# Patient Record
Sex: Female | Born: 1935 | Race: White | Hispanic: No | Smoking: Never smoker
Health system: Southern US, Community
[De-identification: ages and names within clinical notes are randomized; demographics above are authoritative.]

## PROBLEM LIST (undated history)

## (undated) DIAGNOSIS — E785 Hyperlipidemia, unspecified: Secondary | ICD-10-CM

## (undated) DIAGNOSIS — I10 Essential (primary) hypertension: Secondary | ICD-10-CM

## (undated) DIAGNOSIS — M8580 Other specified disorders of bone density and structure, unspecified site: Secondary | ICD-10-CM

## (undated) DIAGNOSIS — I4891 Unspecified atrial fibrillation: Secondary | ICD-10-CM

## (undated) DIAGNOSIS — L900 Lichen sclerosus et atrophicus: Secondary | ICD-10-CM

## (undated) DIAGNOSIS — K219 Gastro-esophageal reflux disease without esophagitis: Secondary | ICD-10-CM

## (undated) DIAGNOSIS — I639 Cerebral infarction, unspecified: Secondary | ICD-10-CM

## (undated) DIAGNOSIS — R002 Palpitations: Secondary | ICD-10-CM

## (undated) HISTORY — DX: Hyperlipidemia, unspecified: E78.5

## (undated) HISTORY — DX: Lichen sclerosus et atrophicus: L90.0

## (undated) HISTORY — PX: CARPAL TUNNEL RELEASE: SHX101

## (undated) HISTORY — DX: Gastro-esophageal reflux disease without esophagitis: K21.9

## (undated) HISTORY — DX: Palpitations: R00.2

## (undated) HISTORY — PX: FOOT SURGERY: SHX648

## (undated) HISTORY — DX: Other specified disorders of bone density and structure, unspecified site: M85.80

## (undated) HISTORY — DX: Essential (primary) hypertension: I10

---

## 2004-03-16 ENCOUNTER — Ambulatory Visit: Payer: Self-pay | Admitting: Family Medicine

## 2005-04-27 ENCOUNTER — Ambulatory Visit: Payer: Self-pay | Admitting: Family Medicine

## 2005-07-01 ENCOUNTER — Ambulatory Visit: Payer: Self-pay | Admitting: Family Medicine

## 2006-11-30 ENCOUNTER — Ambulatory Visit: Payer: Self-pay | Admitting: Family Medicine

## 2007-12-03 ENCOUNTER — Ambulatory Visit: Payer: Self-pay | Admitting: Family Medicine

## 2008-03-21 ENCOUNTER — Ambulatory Visit: Payer: Self-pay | Admitting: Specialist

## 2008-03-21 ENCOUNTER — Ambulatory Visit: Payer: Self-pay | Admitting: Cardiology

## 2008-03-27 ENCOUNTER — Ambulatory Visit: Payer: Self-pay | Admitting: Specialist

## 2008-12-03 ENCOUNTER — Ambulatory Visit: Payer: Self-pay | Admitting: Family Medicine

## 2010-01-27 LAB — HM DEXA SCAN: HM Dexa Scan: NORMAL

## 2010-03-02 ENCOUNTER — Ambulatory Visit: Payer: Self-pay | Admitting: Family Medicine

## 2011-03-08 ENCOUNTER — Ambulatory Visit: Payer: Self-pay | Admitting: Family Medicine

## 2013-04-02 ENCOUNTER — Ambulatory Visit: Payer: Self-pay | Admitting: Family Medicine

## 2013-08-23 ENCOUNTER — Ambulatory Visit: Payer: Self-pay | Admitting: Unknown Physician Specialty

## 2013-11-04 ENCOUNTER — Encounter: Payer: Self-pay | Admitting: Neurology

## 2013-11-13 ENCOUNTER — Encounter: Payer: Self-pay | Admitting: Neurology

## 2013-12-14 ENCOUNTER — Encounter: Payer: Self-pay | Admitting: Neurology

## 2014-04-29 ENCOUNTER — Ambulatory Visit: Payer: Self-pay | Admitting: Family Medicine

## 2014-07-22 LAB — BASIC METABOLIC PANEL
BUN: 12 mg/dL (ref 4–21)
Creatinine: 0.9 mg/dL (ref 0.5–1.1)
Glucose: 91 mg/dL
Potassium: 4.5 mmol/L (ref 3.4–5.3)
Sodium: 140 mmol/L (ref 137–147)

## 2014-07-22 LAB — LIPID PANEL
Cholesterol: 201 mg/dL — AB (ref 0–200)
HDL: 77 mg/dL — AB (ref 35–70)
LDL Cholesterol: 99 mg/dL
LDl/HDL Ratio: 1.3
Triglycerides: 124 mg/dL (ref 40–160)

## 2014-07-22 LAB — HEPATIC FUNCTION PANEL
ALT: 13 U/L (ref 7–35)
AST: 17 U/L (ref 13–35)
Alkaline Phosphatase: 80 U/L (ref 25–125)
Bilirubin, Total: 0.3 mg/dL

## 2014-07-22 LAB — CBC AND DIFFERENTIAL
HCT: 44 % (ref 36–46)
Hemoglobin: 14.8 g/dL (ref 12.0–16.0)
Neutrophils Absolute: 3 /uL
Platelets: 232 10*3/uL (ref 150–399)
WBC: 5.6 10^3/mL

## 2014-07-22 LAB — HEMOGLOBIN A1C: Hgb A1c MFr Bld: 6.1 % — AB (ref 4.0–6.0)

## 2014-07-22 LAB — TSH: TSH: 3.73 u[IU]/mL (ref 0.41–5.90)

## 2014-11-03 ENCOUNTER — Encounter: Payer: Self-pay | Admitting: Family Medicine

## 2014-11-03 ENCOUNTER — Ambulatory Visit (INDEPENDENT_AMBULATORY_CARE_PROVIDER_SITE_OTHER): Payer: PPO | Admitting: Family Medicine

## 2014-11-03 VITALS — BP 144/80 | HR 73 | Temp 98.4°F | Resp 16 | Ht 63.0 in | Wt 160.0 lb

## 2014-11-03 DIAGNOSIS — J069 Acute upper respiratory infection, unspecified: Secondary | ICD-10-CM

## 2014-11-03 MED ORDER — HYDROCODONE-HOMATROPINE 5-1.5 MG/5ML PO SYRP: ORAL_SOLUTION | ORAL | Status: DC

## 2014-11-03 NOTE — Patient Instructions (Signed)
Continue Robitussin or Mucinex  expectorant

## 2014-11-03 NOTE — Progress Notes (Signed)
Subjective:     Patient ID: Deanna Williams, female   DOB: 14-Jun-1928, 81 y.o.   MRN: [id number]  HPI  Chief Complaint  Patient presents with  . Cough    patient comes in office today with complaints of productive cough/yellow green phlegm and congestion for one week  Has been taking Robitussin for her sx. States cough keeps her up at night.   Review of Systems  Constitutional: Negative for fever and chills.  Respiratory: Negative for shortness of breath.        Objective:   Physical Exam  Constitutional: She appears well-developed and well-nourished.  Ears: T.M's intact without inflammation Throat: no tonsillar enlargement or exudate Neck: no cervical adenopathy Lungs: coarse basilar crackles    Assessment:     1. Upper respiratory infection  - HYDROcodone-homatropine (HYCODAN) 5-1.5 MG/5ML syrup; 2.5 to 5 ml.  Dispense: 120 mL; Refill: 0    Plan:    continue expectorant Call if cough not improving over the course of the week.

## 2014-11-04 ENCOUNTER — Telehealth: Payer: Self-pay | Admitting: Family Medicine

## 2014-11-04 NOTE — Telephone Encounter (Signed)
2.5 to 5 ml. Every 8 hours as needed for cough

## 2014-11-04 NOTE — Telephone Encounter (Signed)
Spoke with pharmacist on phone and advised her of sig as stated below.

## 2014-11-04 NOTE — Telephone Encounter (Signed)
Please advise sig.

## 2014-11-04 NOTE — Telephone Encounter (Signed)
Amy with [facility] states the Rx for HYDROcodone-homatropine (HYCODAN) 5-1.5 MG/5ML syrup did not have directions listed.  JA#[phone]/AL

## 2015-04-14 DIAGNOSIS — M8580 Other specified disorders of bone density and structure, unspecified site: Secondary | ICD-10-CM | POA: Insufficient documentation

## 2015-04-14 DIAGNOSIS — R030 Elevated blood-pressure reading, without diagnosis of hypertension: Secondary | ICD-10-CM

## 2015-04-14 DIAGNOSIS — E785 Hyperlipidemia, unspecified: Secondary | ICD-10-CM | POA: Insufficient documentation

## 2015-04-14 DIAGNOSIS — K219 Gastro-esophageal reflux disease without esophagitis: Secondary | ICD-10-CM | POA: Insufficient documentation

## 2015-04-14 DIAGNOSIS — N6019 Diffuse cystic mastopathy of unspecified breast: Secondary | ICD-10-CM | POA: Insufficient documentation

## 2015-04-14 DIAGNOSIS — M1990 Unspecified osteoarthritis, unspecified site: Secondary | ICD-10-CM | POA: Insufficient documentation

## 2015-04-14 DIAGNOSIS — I10 Essential (primary) hypertension: Secondary | ICD-10-CM | POA: Insufficient documentation

## 2015-04-14 DIAGNOSIS — R7303 Prediabetes: Secondary | ICD-10-CM | POA: Insufficient documentation

## 2015-04-14 DIAGNOSIS — J309 Allergic rhinitis, unspecified: Secondary | ICD-10-CM | POA: Insufficient documentation

## 2015-04-14 DIAGNOSIS — E7800 Pure hypercholesterolemia, unspecified: Secondary | ICD-10-CM | POA: Insufficient documentation

## 2015-04-14 DIAGNOSIS — E669 Obesity, unspecified: Secondary | ICD-10-CM | POA: Insufficient documentation

## 2015-04-14 DIAGNOSIS — IMO0001 Reserved for inherently not codable concepts without codable children: Secondary | ICD-10-CM | POA: Insufficient documentation

## 2015-04-14 DIAGNOSIS — R002 Palpitations: Secondary | ICD-10-CM | POA: Insufficient documentation

## 2015-04-21 ENCOUNTER — Ambulatory Visit (INDEPENDENT_AMBULATORY_CARE_PROVIDER_SITE_OTHER): Payer: PPO | Admitting: Family Medicine

## 2015-04-21 ENCOUNTER — Encounter: Payer: Self-pay | Admitting: Family Medicine

## 2015-04-21 VITALS — BP 118/72 | HR 62 | Temp 98.0°F | Resp 12 | Ht 62.5 in | Wt 159.0 lb

## 2015-04-21 DIAGNOSIS — J3489 Other specified disorders of nose and nasal sinuses: Secondary | ICD-10-CM | POA: Diagnosis not present

## 2015-04-21 DIAGNOSIS — Z0000 Encounter for general adult medical examination without abnormal findings: Secondary | ICD-10-CM | POA: Diagnosis not present

## 2015-04-21 DIAGNOSIS — Z1239 Encounter for other screening for malignant neoplasm of breast: Secondary | ICD-10-CM

## 2015-04-21 NOTE — Progress Notes (Signed)
Patient ID: Deanna Williams, female   DOB: 1928/07/24, 81 y.o.   MRN: [id number]  Visit Date: 04/21/2015  Today's Provider: [name], MD   Chief Complaint  Patient presents with  . Medicare Wellness   Subjective:   Deanna Williams is a 81 y.o. female who presents today for her Subsequent Annual Wellness Visit. She feels well. She reports exercising walking. She reports she is sleeping well. Immunization History  Administered Date(s) Administered  . Influenza-Unspecified 02/13/2014  . Pneumococcal Conjugate-13 03/24/2014  . Pneumococcal Polysaccharide-23 03/04/1998  . Td 03/06/2008  . Zoster 07/30/2007   LAST Pap smear 10/16/07  Mammogram 04/29/14  Colonoscopy 06/18/02 normal   BMD 01/27/10 osteopenia.    Review of Systems  Constitutional: Negative.   HENT: Negative.   Eyes: Negative.   Respiratory: Negative.   Cardiovascular: Negative.   Gastrointestinal: Negative.   Endocrine: Negative.   Genitourinary: Negative.   Musculoskeletal: Positive for back pain and arthralgias.  Skin: Negative.   Allergic/Immunologic: Negative.   Neurological: Negative.   Hematological: Negative.   Psychiatric/Behavioral: Negative.     Patient Active Problem List   Diagnosis Date Noted  . Allergic rhinitis 04/14/2015  . Blood pressure elevated 04/14/2015  . 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 Acid reflux 04/14/2015  . 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 Hypercholesterolemia 04/14/2015  . BP (high blood pressure) 04/14/2015  . Adiposity 04/14/2015  . Arthritis, degenerative 04/14/2015  . Osteopenia 04/14/2015  . Awareness of heartbeats 04/14/2015    Social History   Social History  . Marital Status: Married    Spouse Name: N/A  . Number of Children: N/A  . Years of Education: N/A   Occupational History  . Not on file.   Social History Main Topics  . Smoking status: Never Smoker   . Smokeless tobacco: Never Used  . Alcohol Use: 0.0 oz/week    0 Standard drinks or  equivalent per week  . Drug Use: No  . Sexual Activity: No   Other Topics Concern  . Not on file   Social History Narrative    Past Surgical History  Procedure Laterality Date  . Foot surgery    . Carpal tunnel release      Her family history includes Arthritis in her sister; Cancer in her father and mother; Hypertension in her father; Leukemia in her mother.    Outpatient Prescriptions Prior to Visit  Medication Sig Dispense Refill  . HYDROcodone-homatropine (HYCODAN) 5-1.5 MG/5ML syrup 2.5 to 5 ml. 120 mL 0   No facility-administered medications prior to visit.    No Known Allergies  Patient Care Team: Jerrol Banana., MD as PCP - General (Family Medicine)  Objective:   Vitals:  Filed Vitals:   04/21/15 0913  BP: 118/72  Pulse: 62  Temp: 98 F (36.7 C)  Resp: 12  Height: 5' 2.5" (1.588 m)  Weight: 159 lb (72.122 kg)    Physical Exam  Constitutional: She is oriented to person, place, and time. She appears well-developed and well-nourished.  HENT:  Head: Normocephalic and atraumatic.  Right Ear: External ear normal.  Left Ear: External ear normal.  Nose: Nose normal.  Eyes: Conjunctivae are normal.  Neck: Neck supple.  Cardiovascular: Normal rate, regular rhythm and normal heart sounds.   Pulmonary/Chest: Effort normal and breath sounds normal.  Abdominal: Soft.  Neurological: She is alert and oriented to person, place, and time.  Skin: Skin is dry.  Psychiatric: She has a normal mood and affect.  Her behavior is normal. Judgment and thought content normal.    Activities of Daily Living In your present state of health, do you have any difficulty performing the following activities: 04/21/2015  Hearing? N  Vision? N  Difficulty concentrating or making decisions? N  Walking or climbing stairs? N  Dressing or bathing? N  Doing errands, shopping? N    Fall Risk Assessment Fall Risk  04/21/2015  Falls in the past year? No     Depression  Screen PHQ 2/9 Scores 04/21/2015  PHQ - 2 Score 0    Cognitive Testing - 6-CIT    Year: 0 4 points  Month: 0 3 points  Memorize "Pia Mau, [address], [employer]"  Time (within 1 hour:) 0 3 points  Count backwards from 20: 0 2 4 points  Name months of year: 0 2 4 points  Repeat Address: 0 2 4 6 8 10  points   Total Score: 0/28  Interpretation : Normal (0-7) Abnormal (8-28)    Assessment & Plan:     Annual Wellness Visit  Reviewed patient's Family Medical History Reviewed and updated list of patient's medical providers Assessment of cognitive impairment was done Assessed patient's functional ability Established a written schedule for health screening [place] Completed and Reviewed      Miguel Aschoff MD [facility] 04/21/2015 9:15 AM  ------------------------------------------------------------------------------------------------------------

## 2015-05-07 ENCOUNTER — Ambulatory Visit
Admission: RE | Admit: 2015-05-07 | Discharge: 2015-05-07 | Disposition: A | Discharge status: Home / Self Care | Payer: PPO | Source: Ambulatory Visit | Attending: Family Medicine | Admitting: Family Medicine

## 2015-05-07 DIAGNOSIS — Z1239 Encounter for other screening for malignant neoplasm of breast: Secondary | ICD-10-CM

## 2015-05-07 DIAGNOSIS — Z1231 Encounter for screening mammogram for malignant neoplasm of breast: Secondary | ICD-10-CM | POA: Insufficient documentation

## 2015-06-25 DIAGNOSIS — L570 Actinic keratosis: Secondary | ICD-10-CM | POA: Diagnosis not present

## 2015-06-25 DIAGNOSIS — L821 Other seborrheic keratosis: Secondary | ICD-10-CM | POA: Diagnosis not present

## 2015-06-25 DIAGNOSIS — L578 Other skin changes due to chronic exposure to nonionizing radiation: Secondary | ICD-10-CM | POA: Diagnosis not present

## 2015-06-25 DIAGNOSIS — L719 Rosacea, unspecified: Secondary | ICD-10-CM | POA: Diagnosis not present

## 2015-06-25 DIAGNOSIS — L820 Inflamed seborrheic keratosis: Secondary | ICD-10-CM | POA: Diagnosis not present

## 2015-06-25 DIAGNOSIS — L812 Freckles: Secondary | ICD-10-CM | POA: Diagnosis not present

## 2015-07-20 ENCOUNTER — Ambulatory Visit: Payer: PPO | Admitting: Family Medicine

## 2015-07-27 ENCOUNTER — Ambulatory Visit
Admission: RE | Admit: 2015-07-27 | Discharge: 2015-07-27 | Disposition: A | Discharge status: Home / Self Care | Payer: PPO | Source: Ambulatory Visit | Attending: Family Medicine | Admitting: Family Medicine

## 2015-07-27 ENCOUNTER — Ambulatory Visit (INDEPENDENT_AMBULATORY_CARE_PROVIDER_SITE_OTHER): Payer: PPO | Admitting: Family Medicine

## 2015-07-27 VITALS — BP 128/62 | HR 64 | Temp 97.7°F | Resp 14 | Wt 158.0 lb

## 2015-07-27 DIAGNOSIS — M419 Scoliosis, unspecified: Secondary | ICD-10-CM | POA: Insufficient documentation

## 2015-07-27 DIAGNOSIS — M5136 Other intervertebral disc degeneration, lumbar region: Secondary | ICD-10-CM | POA: Diagnosis not present

## 2015-07-27 DIAGNOSIS — N949 Unspecified condition associated with female genital organs and menstrual cycle: Secondary | ICD-10-CM

## 2015-07-27 DIAGNOSIS — R319 Hematuria, unspecified: Secondary | ICD-10-CM | POA: Diagnosis not present

## 2015-07-27 DIAGNOSIS — I739 Peripheral vascular disease, unspecified: Secondary | ICD-10-CM | POA: Insufficient documentation

## 2015-07-27 DIAGNOSIS — M160 Bilateral primary osteoarthritis of hip: Secondary | ICD-10-CM | POA: Diagnosis not present

## 2015-07-27 DIAGNOSIS — M545 Low back pain: Secondary | ICD-10-CM | POA: Diagnosis not present

## 2015-07-27 DIAGNOSIS — M79604 Pain in right leg: Secondary | ICD-10-CM | POA: Diagnosis not present

## 2015-07-27 DIAGNOSIS — R739 Hyperglycemia, unspecified: Secondary | ICD-10-CM

## 2015-07-27 DIAGNOSIS — E7800 Pure hypercholesterolemia, unspecified: Secondary | ICD-10-CM

## 2015-07-27 LAB — POCT URINALYSIS DIPSTICK
Bilirubin, UA: NEGATIVE
Glucose, UA: NEGATIVE
Ketones, UA: NEGATIVE
Leukocytes, UA: NEGATIVE
Nitrite, UA: NEGATIVE
Protein, UA: NEGATIVE
Spec Grav, UA: 1.01
Urobilinogen, UA: 0.2
pH, UA: 6.5

## 2015-07-27 MED ORDER — MELOXICAM 7.5 MG PO TABS: 7.5000 mg | ORAL_TABLET | Freq: Every day | ORAL | Status: DC

## 2015-07-27 NOTE — Progress Notes (Signed)
Patient ID: IDALY LYSON, female   DOB: July 27, 1928, 82 y.o.   MRN: [id number]    Subjective:  HPI  Patient is here for follow up. Patient needs labs done. Last labs were March 2016. She is still not on any medications.  She has had issues with lower back pain and pain radiates to the right leg with walking/anything physical. No leg weakness. She tries to sit down when the pain starts and after a little while the pain goes away and she can move again.  She also states that she started to have burning and itching vaginal again. She states this happened before and looking at the old records it was in 2014, we did wet prep that was ok and started her pn premarin cream, patient a week later developed hematuria and after seen her again we referred patient to Dr .Melody Haver. Symptoms are the same as before. Symptoms occurs daily all day long.  Prior to Admission medications   Not on File    Patient Active Problem List   Diagnosis Date Noted  . Allergic rhinitis 04/14/2015  . Blood pressure elevated 04/14/2015  . 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 Acid reflux 04/14/2015  . 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 Hypercholesterolemia 04/14/2015  . BP (high blood pressure) 04/14/2015  . Adiposity 04/14/2015  . Arthritis, degenerative 04/14/2015  . Osteopenia 04/14/2015  . Awareness of heartbeats 04/14/2015    No past medical history on file.  Social History   Social History  . Marital Status: Married    Spouse Name: N/A  . Number of Children: N/A  . Years of Education: N/A   Occupational History  . Not on file.   Social History Main Topics  . Smoking status: Never Smoker   . Smokeless tobacco: Never Used  . Alcohol Use: 0.0 oz/week    0 Standard drinks or equivalent per week  . Drug Use: No  . Sexual Activity: No   Other Topics Concern  . Not on file   Social History Narrative    No Known Allergies  Review of Systems  Constitutional: Negative.   HENT: Negative.     Eyes: Negative.   Respiratory: Negative.   Cardiovascular: Negative.   Gastrointestinal: Negative.   Genitourinary: Negative.        Vaginal burning and itching  Musculoskeletal: Positive for back pain and joint pain.  Skin: Negative.   Neurological: Negative.   Endo/Heme/Allergies: Negative.   Psychiatric/Behavioral: Negative.     Immunization History  Administered Date(s) Administered  . Influenza-Unspecified 02/13/2014  . Pneumococcal Conjugate-13 03/24/2014  . Pneumococcal Polysaccharide-23 03/04/1998  . Td 03/06/2008  . Zoster 07/30/2007   Objective:  BP 128/62 mmHg  Pulse 64  Temp(Src) 97.7 F (36.5 C)  Resp 14  Wt 158 lb (71.668 kg)  Physical Exam  Constitutional: She is oriented to person, place, and time and well-developed, well-nourished, and in no distress.  HENT:  Head: Normocephalic and atraumatic.  Right Ear: External ear normal.  Left Ear: External ear normal.  Nose: Nose normal.  Eyes: Conjunctivae are normal. Pupils are equal, round, and reactive to light.  Neck: Normal range of motion. Neck supple. No thyromegaly present.  Cardiovascular: Normal rate and regular rhythm.   Murmur heard.  Systolic murmur is present with a grade of 2/6  Pulmonary/Chest: Effort normal and breath sounds normal. No respiratory distress. She has no wheezes.  Abdominal: Soft.  Musculoskeletal: She exhibits no edema or tenderness.  Neurological: She is alert and oriented  to person, place, and time. She displays normal reflexes. Gait normal.  Skin: Skin is warm and dry.  Psychiatric: Mood, memory, affect and judgment normal.    Lab Results  Component Value Date   WBC 5.6 07/22/2014   HGB 14.8 07/22/2014   HCT 44 07/22/2014   PLT 232 07/22/2014   CHOL 201* 07/22/2014   TRIG 124 07/22/2014   HDL 77* 07/22/2014   LDLCALC 99 07/22/2014   TSH 3.73 07/22/2014   HGBA1C 6.1* 07/22/2014    CMP     Component Value Date/Time   NA 140 07/22/2014   K 4.5 07/22/2014    BUN 12 07/22/2014   CREATININE 0.9 07/22/2014   AST 17 07/22/2014   ALT 13 07/22/2014   ALKPHOS 80 07/22/2014    Assessment and Plan :  1. Hypercholesterolemia - CBC with Differential/Platelet - Comprehensive metabolic panel - Lipid Panel With LDL/HDL Ratio  2. Hyperglycemia - HgB A1c  3. Vaginal burning/atrophic vaginitis Refer back to Dr. Orlene Plum. - POCT urinalysis dipstick - Ambulatory referral to Gynecology  4. Hematuria - Ambulatory referral to Gynecology  5. Right leg pain New. Will xray and start Meloxicam for 1 month. Patient to let us know if symptoms continue and may need further work up. - DG Lumbar Spine Complete; Future - DG HIP UNILAT WITH PELVIS 2-3 VIEWS RIGHT; Future  6. Claudication of right lower extremity (HCC) - DG Lumbar Spine Complete; Future - DG HIP UNILAT WITH PELVIS 2-3 VIEWS RIGHT; Future Likely neurogenic claudication. I have done the exam and reviewed the above chart and it is accurate to the best of my knowledge.   Miguel Aschoff MD [facility] 07/27/2015 11:05 AM

## 2015-07-30 DIAGNOSIS — R739 Hyperglycemia, unspecified: Secondary | ICD-10-CM | POA: Diagnosis not present

## 2015-07-30 DIAGNOSIS — E7800 Pure hypercholesterolemia, unspecified: Secondary | ICD-10-CM | POA: Diagnosis not present

## 2015-07-31 LAB — COMPREHENSIVE METABOLIC PANEL
ALT: 7 IU/L (ref 0–32)
AST: 13 IU/L (ref 0–40)
Albumin/Globulin Ratio: 1.9 (ref 1.2–2.2)
Albumin: 4.1 g/dL (ref 3.5–4.7)
Alkaline Phosphatase: 81 IU/L (ref 39–117)
BUN/Creatinine Ratio: 23 (ref 11–26)
BUN: 20 mg/dL (ref 8–27)
Bilirubin Total: 0.5 mg/dL (ref 0.0–1.2)
CO2: 22 mmol/L (ref 18–29)
Calcium: 9.3 mg/dL (ref 8.7–10.3)
Chloride: 102 mmol/L (ref 96–106)
Creatinine, Ser: 0.88 mg/dL (ref 0.57–1.00)
GFR calc Af Amer: 70 mL/min/{1.73_m2} (ref >59)
GFR calc non Af Amer: 61 mL/min/{1.73_m2} (ref >59)
Globulin, Total: 2.2 g/dL (ref 1.5–4.5)
Glucose: 100 mg/dL — ABNORMAL HIGH (ref 65–99)
Potassium: 4.7 mmol/L (ref 3.5–5.2)
Sodium: 140 mmol/L (ref 134–144)
Total Protein: 6.3 g/dL (ref 6.0–8.5)

## 2015-07-31 LAB — LIPID PANEL WITH LDL/HDL RATIO
Cholesterol, Total: 190 mg/dL (ref 100–199)
HDL: 79 mg/dL (ref >39)
LDL Calculated: 98 mg/dL (ref 0–99)
LDl/HDL Ratio: 1.2 ratio units (ref 0.0–3.2)
Triglycerides: 66 mg/dL (ref 0–149)
VLDL Cholesterol Cal: 13 mg/dL (ref 5–40)

## 2015-07-31 LAB — CBC WITH DIFFERENTIAL/PLATELET
Basophils Absolute: 0 10*3/uL (ref 0.0–0.2)
Basos: 0 %
EOS (ABSOLUTE): 0.1 10*3/uL (ref 0.0–0.4)
Eos: 2 %
Hematocrit: 43.8 % (ref 34.0–46.6)
Hemoglobin: 14.3 g/dL (ref 11.1–15.9)
Immature Grans (Abs): 0 10*3/uL (ref 0.0–0.1)
Immature Granulocytes: 0 %
Lymphocytes Absolute: 1.5 10*3/uL (ref 0.7–3.1)
Lymphs: 32 %
MCH: 31 pg (ref 26.6–33.0)
MCHC: 32.6 g/dL (ref 31.5–35.7)
MCV: 95 fL (ref 79–97)
Monocytes Absolute: 0.3 10*3/uL (ref 0.1–0.9)
Monocytes: 7 %
Neutrophils Absolute: 2.7 10*3/uL (ref 1.4–7.0)
Neutrophils: 59 %
Platelets: 233 10*3/uL (ref 150–379)
RBC: 4.61 x10E6/uL (ref 3.77–5.28)
RDW: 13.2 % (ref 12.3–15.4)
WBC: 4.5 10*3/uL (ref 3.4–10.8)

## 2015-07-31 LAB — HEMOGLOBIN A1C
Est. average glucose Bld gHb Est-mCnc: 128 mg/dL
Hgb A1c MFr Bld: 6.1 % — ABNORMAL HIGH (ref 4.8–5.6)

## 2015-08-03 ENCOUNTER — Ambulatory Visit (INDEPENDENT_AMBULATORY_CARE_PROVIDER_SITE_OTHER): Payer: PPO | Admitting: Family Medicine

## 2015-08-03 ENCOUNTER — Encounter: Payer: Self-pay | Admitting: Family Medicine

## 2015-08-03 VITALS — BP 132/70 | HR 76 | Temp 98.0°F | Resp 16 | Wt 158.0 lb

## 2015-08-03 DIAGNOSIS — T148 Other injury of unspecified body region: Secondary | ICD-10-CM

## 2015-08-03 DIAGNOSIS — IMO0002 Reserved for concepts with insufficient information to code with codable children: Secondary | ICD-10-CM

## 2015-08-03 DIAGNOSIS — M8580 Other specified disorders of bone density and structure, unspecified site: Secondary | ICD-10-CM

## 2015-08-03 MED ORDER — MELOXICAM 7.5 MG PO TABS: 7.5000 mg | ORAL_TABLET | Freq: Every day | ORAL | Status: DC

## 2015-08-03 NOTE — Progress Notes (Signed)
Patient ID: Deanna Williams, female   DOB: Jul 24, 1928, 82 y.o.   MRN: [id number]    Subjective:  HPI Pt is here today to discuss her xray results. She had hip and back x-rays done. Showed arthritis and possible mild compression fracture. Last BMD was 01/27/10. She reports that her pain is still there intermittently but she is not taking the Meloxicam. Said she never got a rx, but it was sent to the pharmacy.   Prior to Admission medications   Medication Sig Start Date End Date Taking? Authorizing Provider  meloxicam (MOBIC) 7.5 MG tablet Take 1 tablet (7.5 mg total) by mouth daily. Patient not taking: Reported on 08/03/2015 07/27/15   Jerrol Banana., MD    Patient Active Problem List   Diagnosis Date Noted  . Allergic rhinitis 04/14/2015  . Blood pressure elevated 04/14/2015  . 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 Acid reflux 04/14/2015  . 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 Hypercholesterolemia 04/14/2015  . BP (high blood pressure) 04/14/2015  . Adiposity 04/14/2015  . Arthritis, degenerative 04/14/2015  . Osteopenia 04/14/2015  . Awareness of heartbeats 04/14/2015    History reviewed. No pertinent past medical history.  Social History   Social History  . Marital Status: Married    Spouse Name: N/A  . Number of Children: N/A  . Years of Education: N/A   Occupational History  . Not on file.   Social History Main Topics  . Smoking status: Never Smoker   . Smokeless tobacco: Never Used  . Alcohol Use: 0.0 oz/week    0 Standard drinks or equivalent per week  . Drug Use: No  . Sexual Activity: No   Other Topics Concern  . Not on file   Social History Narrative    No Known Allergies  Review of Systems  Constitutional: Negative.   HENT: Negative.   Eyes: Negative.   Respiratory: Negative.   Cardiovascular: Negative.   Gastrointestinal: Negative.   Genitourinary:       Vaginal burning. Not new. Pt was referred to GYN on last OV.  Musculoskeletal: Positive  for joint pain.  Skin: Negative.   Neurological: Negative.   Endo/Heme/Allergies: Negative.   Psychiatric/Behavioral: Negative.     Immunization History  Administered Date(s) Administered  . Influenza-Unspecified 02/13/2014  . Pneumococcal Conjugate-13 03/24/2014  . Pneumococcal Polysaccharide-23 03/04/1998  . Td 03/06/2008  . Zoster 07/30/2007   Objective:  BP 132/70 mmHg  Pulse 76  Temp(Src) 98 F (36.7 C) (Oral)  Resp 16  Wt 158 lb (71.668 kg)  Physical Exam  Constitutional: She is oriented to person, place, and time and well-developed, well-nourished, and in no distress.  Eyes: Conjunctivae and EOM are normal. Pupils are equal, round, and reactive to light.  Neck: Normal range of motion. Neck supple.  Cardiovascular: Normal rate, regular rhythm, normal heart sounds and intact distal pulses.   Pulmonary/Chest: Effort normal and breath sounds normal.  Musculoskeletal: Normal range of motion. Tenderness: mild tenderness over greater trochanter.  Neurological: She is alert and oriented to person, place, and time. She has normal reflexes. Gait normal. GCS score is 15.  Skin: Skin is warm and dry.  Psychiatric: Mood, memory, affect and judgment normal.  she is nontender over her vertebral bodies.  Lab Results  Component Value Date   WBC 4.5 07/30/2015   HGB 14.8 07/22/2014   HCT 43.8 07/30/2015   PLT 233 07/30/2015   GLUCOSE 100* 07/30/2015   CHOL 190 07/30/2015   TRIG 66 07/30/2015  HDL 79 07/30/2015   LDLCALC 98 07/30/2015   TSH 3.73 07/22/2014   HGBA1C 6.1* 07/30/2015    CMP     Component Value Date/Time   NA 140 07/30/2015 0849   K 4.7 07/30/2015 0849   CL 102 07/30/2015 0849   CO2 22 07/30/2015 0849   GLUCOSE 100* 07/30/2015 0849   BUN 20 07/30/2015 0849   CREATININE 0.88 07/30/2015 0849   CREATININE 0.9 07/22/2014   CALCIUM 9.3 07/30/2015 0849   PROT 6.3 07/30/2015 0849   ALBUMIN 4.1 07/30/2015 0849   AST 13 07/30/2015 0849   ALT 7 07/30/2015 0849    ALKPHOS 81 07/30/2015 0849   BILITOT 0.5 07/30/2015 0849   GFRNONAA 61 07/30/2015 0849   GFRAA 70 07/30/2015 0849    Assessment and Plan :  1. Compression fracture Get BMD to check to make sure she does not have osteoporosis. Follow up in 1-2 months. - DG Bone Density; Future - meloxicam (MOBIC) 7.5 MG tablet; Take 1 tablet (7.5 mg total) by mouth daily.  Dispense: 30 tablet; Refill: 0  2. Osteopenia   - DG Bone Density; Future 3 right trochanteric bursitis 4 chronic low back pain, probably secondary to DDD andOA We'll treat for a few weeks and see how she does. No referral or MRI at this time. Patient was seen and examined by Dr. Miguel Aschoff, and noted scribed by Webb Laws, [place] MD [facility] 08/03/2015 8:46 AM

## 2015-08-11 ENCOUNTER — Ambulatory Visit
Admission: RE | Admit: 2015-08-11 | Discharge: 2015-08-11 | Disposition: A | Discharge status: Home / Self Care | Payer: PPO | Source: Ambulatory Visit | Attending: Family Medicine | Admitting: Family Medicine

## 2015-08-11 ENCOUNTER — Telehealth: Payer: Self-pay

## 2015-08-11 DIAGNOSIS — M810 Age-related osteoporosis without current pathological fracture: Secondary | ICD-10-CM | POA: Insufficient documentation

## 2015-08-11 DIAGNOSIS — M8580 Other specified disorders of bone density and structure, unspecified site: Secondary | ICD-10-CM | POA: Diagnosis not present

## 2015-08-11 DIAGNOSIS — IMO0002 Reserved for concepts with insufficient information to code with codable children: Secondary | ICD-10-CM

## 2015-08-11 DIAGNOSIS — M85852 Other specified disorders of bone density and structure, left thigh: Secondary | ICD-10-CM | POA: Diagnosis not present

## 2015-08-11 DIAGNOSIS — Z780 Asymptomatic menopausal state: Secondary | ICD-10-CM | POA: Diagnosis not present

## 2015-08-11 DIAGNOSIS — T148 Other injury of unspecified body region: Secondary | ICD-10-CM | POA: Diagnosis not present

## 2015-08-11 MED ORDER — ALENDRONATE SODIUM 70 MG PO TABS: 70.0000 mg | ORAL_TABLET | ORAL | Status: DC

## 2015-08-11 NOTE — Telephone Encounter (Signed)
-----   Message from Jerrol Banana., MD sent at 08/11/2015 10:23 AM EDT ----- If patient has never been on alendronate would start this for osteoporosis. If she was on alendronate in the past would refer to endocrinology for treatment of further osteoporosis.

## 2015-08-11 NOTE — Telephone Encounter (Signed)
Pt has not been on Fosamax in the past. Sent into [facility]. Renaldo Fiddler, CMA

## 2015-08-12 ENCOUNTER — Encounter: Payer: Self-pay | Admitting: Obstetrics and Gynecology

## 2015-08-12 ENCOUNTER — Ambulatory Visit (INDEPENDENT_AMBULATORY_CARE_PROVIDER_SITE_OTHER): Payer: PPO | Admitting: Obstetrics and Gynecology

## 2015-08-12 VITALS — BP 149/77 | HR 79 | Ht 61.0 in | Wt 157.8 lb

## 2015-08-12 DIAGNOSIS — L900 Lichen sclerosus et atrophicus: Secondary | ICD-10-CM

## 2015-08-12 DIAGNOSIS — N950 Postmenopausal bleeding: Secondary | ICD-10-CM | POA: Diagnosis not present

## 2015-08-12 MED ORDER — CLOBETASOL PROPIONATE 0.05 % EX OINT: 1.0000 "application " | TOPICAL_OINTMENT | Freq: Two times a day (BID) | CUTANEOUS | Status: DC

## 2015-08-12 NOTE — Progress Notes (Signed)
GYN ENCOUNTER NOTE  Subjective:       Deanna Williams is a 82 y.o. No obstetric history on file. female is here for gynecologic evaluation of the following issues:  1. Postmenopausal bleeding.   2. Vulvar itching  Medical record and reviewed demonstrates that the patient had previous diagnosis of lichen sclerosus which was treated with Temovate ointment with success. This was done by myself and patient did not follow-up after last visit in January 2015. The diagnosis was confirmed with a biopsy. She is now experiencing recurrent vulvar itching and burning similar to when she first presented for the lichen sclerosus therapy  Patient also has had an episode of vaginal bleeding, bright red blood. Patient is not on HRT therapy. She is not on anticoagulants. Endometrial biopsy in December 2014 demonstrated endometrial polyp fragments without evidence of atypia or malignancy   Gynecologic History No LMP recorded. Patient is postmenopausal. Contraception: post menopausal status  Vulvar biopsy [id number]: Lichen sclerosus Endometrial biopsy 05/01/2013-pathology: Endometrial polyp fragments without evidence of atypia or malignancy  Obstetric History OB History  No data available    Past Medical History  Diagnosis Date  . Osteopenia     Past Surgical History  Procedure Laterality Date  . Foot surgery    . Carpal tunnel release      Current Outpatient Prescriptions on File Prior to Visit  Medication Sig Dispense Refill  . alendronate (FOSAMAX) 70 MG tablet Take 1 tablet (70 mg total) by mouth once a week. Take with a full glass of water on an empty stomach. 4 tablet 12  . meloxicam (MOBIC) 7.5 MG tablet Take 1 tablet (7.5 mg total) by mouth daily. 30 tablet 0   No current facility-administered medications on file prior to visit.    No Known Allergies  Social History   Social History  . Marital Status: Married    Spouse Name: N/A  . Number of Children: N/A  . Years of  Education: N/A   Occupational History  . Not on file.   Social History Main Topics  . Smoking status: Never Smoker   . Smokeless tobacco: Never Used  . Alcohol Use: 0.0 oz/week    0 Standard drinks or equivalent per week     Comment: rare  . Drug Use: No  . Sexual Activity: No   Other Topics Concern  . Not on file   Social History Narrative    Family History  Problem Relation Age of Onset  . Cancer Mother   . Leukemia Mother   . Cancer Father     colon  . Hypertension Father   . Arthritis Sister   . Breast cancer Neg Hx     The following portions of the patient's history were reviewed and updated as appropriate: allergies, current medications, past family history, past medical history, past social history, past surgical history and problem list.  Review of Systems Review of Systems - General ROS: negative for - chills, fatigue, fever, hot flashes, malaise or night sweats Hematological and Lymphatic ROS: negative for - bleeding problems or swollen lymph nodes Gastrointestinal ROS: negative for - abdominal pain, blood in stools, change in bowel habits and nausea/vomiting Musculoskeletal ROS: negative for - joint pain, muscle pain or muscular weakness Genito-Urinary ROS: Positive for vulvar itching; positive for bleeding from vagina  Objective:   BP 149/77 mmHg  Pulse 79  Ht 5\' 1"  (1.549 m)  Wt 157 lb 12.8 oz (71.578 kg)  BMI 29.83 kg/m2 CONSTITUTIONAL: Well-developed, well-nourished  female in no acute distress.  HENT:  Normocephalic, atraumatic.  NECK: Normal range of motion, supple, no masses.  Normal thyroid.  SKIN: Skin is warm and dry. No rash noted. Not diaphoretic. No erythema. No pallor. [place]: Alert and oriented to person, place, and time. PSYCHIATRIC: Normal mood and affect. Normal behavior. Normal judgment and thought content. CARDIOVASCULAR:Not Examined RESPIRATORY: Not Examined BREASTS: Not Examined ABDOMEN: Soft, non distended; Non tender.  No  Organomegaly. PELVIC:  External Genitalia:Atrophic; hypopigmentation with faint leukoplakia) posterior fourchette; no ulceration or epithelial skin breakdown  [id number] caruncle  Vagina:Atrophic changes; no blood in vault  Cervix:No lesions  Uterus: Normal size, shape,consistency, mobile  Adnexa: Normal  RV: Normal External exam  Bladder: Nontender MUSCULOSKELETAL: Normal range of motion. No tenderness.  No cyanosis, clubbing, or edema.     Assessment:   1. Lichen sclerosus  2. Postmenopausal bleeding - US Pelvis Complete; Future - US Transvaginal Non-OB; Future     Plan:   1. Temovate ointment 0.05% topically twice a day for 2 weeks, then daily for 4 weeks 2. Return in 6 weeks for follow-up 3. Pelvic ultrasound to assess postmenopausal bleeding 4. Return in 1 week after ultrasound for review and endometrial biopsy  Brayton Mars, MD  Note: This dictation was prepared with Dragon dictation along with smaller phrase technology. Any transcriptional errors that result from this process are unintentional.

## 2015-08-12 NOTE — Patient Instructions (Signed)
1. Ultrasound is scheduled to assess postmenopausal bleeding 2. Temovate ointment 0.05% is to be applied to the vulva twice a day for 2 weeks, then daily for 4 weeks 3. Return in 6 weeks for follow-up on vulvar itching 4. Return in 1 week after ultrasound for possible endometrial biopsy

## 2015-08-14 ENCOUNTER — Ambulatory Visit (INDEPENDENT_AMBULATORY_CARE_PROVIDER_SITE_OTHER): Payer: PPO

## 2015-08-14 DIAGNOSIS — N950 Postmenopausal bleeding: Secondary | ICD-10-CM

## 2015-08-27 ENCOUNTER — Ambulatory Visit (INDEPENDENT_AMBULATORY_CARE_PROVIDER_SITE_OTHER): Payer: PPO | Admitting: Obstetrics and Gynecology

## 2015-08-27 ENCOUNTER — Encounter: Payer: Self-pay | Admitting: Obstetrics and Gynecology

## 2015-08-27 VITALS — BP 137/84 | HR 71 | Ht 63.0 in | Wt 156.1 lb

## 2015-08-27 DIAGNOSIS — N950 Postmenopausal bleeding: Secondary | ICD-10-CM | POA: Diagnosis not present

## 2015-08-27 DIAGNOSIS — N840 Polyp of corpus uteri: Secondary | ICD-10-CM | POA: Diagnosis not present

## 2015-08-27 DIAGNOSIS — R9389 Abnormal findings on diagnostic imaging of other specified body structures: Secondary | ICD-10-CM

## 2015-08-27 DIAGNOSIS — R938 Abnormal findings on diagnostic imaging of other specified body structures: Secondary | ICD-10-CM | POA: Diagnosis not present

## 2015-08-27 NOTE — Progress Notes (Signed)
Chief complaint: 1. Postmenopausal bleeding 2. Lichen sclerosus  Patient presents today for follow-up on ultrasound regarding postmenopausal bleeding. Ultrasound demonstrates a thickened endometrium. Previous endometrial biopsy in December 2014 demonstrated fragments of endometrial polyps. Patient does need repeat endometrial sampling. However, because of previous biopsy results, I recommend proceeding with hysteroscopy/D&C to thoroughly remove polyps from the endometrial cavity.  Patient also has lichen sclerosis for which she is using Temovate ointment daily. She was scheduled for follow-up in early May 2017. Currently she is asymptomatic with regards to symptomatology.  OBJECTIVE: BP 137/84 mmHg  Pulse 71  Ht 5\' 3"  (1.6 m)  Wt 156 lb 1.6 oz (70.806 kg)  BMI 27.66 kg/m2 Physical exam-deferred  ASSESSMENT: 1. Postmenopausal bleeding 2. Thickened endometrium on ultrasound 3. History of previous endometrial biopsy 2014 (December) with findings suspicious for fragments of endometrial polyps 4. Lichen sclerosis, responding to steroid therapy  PLAN: 1. Hysteroscopy/D&C to be scheduled for June 2017 per patient's request 2. Return in June for preoperative appointment 3. Continue Temovate ointment daily to perineum for lichen sclerosus; will reassess condition in June appointment.  A total of 15 minutes were spent face-to-face with the patient during this encounter and over half of that time dealt with counseling and coordination of care.  Brayton Mars, MD  Note: This dictation was prepared with Dragon dictation along with smaller phrase technology. Any transcriptional errors that result from this process are unintentional.

## 2015-08-27 NOTE — Patient Instructions (Addendum)
1. Return in June 2017 for preop appointment before D&C 2. Continue with Temovate ointment daily as directed for lichen sclerosus

## 2015-09-22 ENCOUNTER — Ambulatory Visit (INDEPENDENT_AMBULATORY_CARE_PROVIDER_SITE_OTHER): Payer: PPO | Admitting: Family Medicine

## 2015-09-22 ENCOUNTER — Encounter: Payer: Self-pay | Admitting: Family Medicine

## 2015-09-22 VITALS — BP 124/68 | HR 74 | Temp 97.5°F | Resp 16 | Wt 158.0 lb

## 2015-09-22 DIAGNOSIS — M810 Age-related osteoporosis without current pathological fracture: Secondary | ICD-10-CM | POA: Diagnosis not present

## 2015-09-22 DIAGNOSIS — M545 Low back pain, unspecified: Secondary | ICD-10-CM

## 2015-09-22 DIAGNOSIS — T148 Other injury of unspecified body region: Secondary | ICD-10-CM | POA: Diagnosis not present

## 2015-09-22 DIAGNOSIS — M1990 Unspecified osteoarthritis, unspecified site: Secondary | ICD-10-CM | POA: Diagnosis not present

## 2015-09-22 DIAGNOSIS — IMO0002 Reserved for concepts with insufficient information to code with codable children: Secondary | ICD-10-CM

## 2015-09-22 MED ORDER — ALENDRONATE SODIUM 70 MG PO TABS: 70.0000 mg | ORAL_TABLET | ORAL | Status: DC

## 2015-09-22 NOTE — Progress Notes (Signed)
Patient ID: Deanna Williams, female   DOB: Oct 05, 1928, 82 y.o.   MRN: [id number]    Subjective:  HPI Osteoporosis- Pt is here for a 2 month follow up after starting Fosamax. She had a BMD and showed osteoporosis. She was under the impression she was only suppose to take this medication for a month. So she took it a month and stopped. She did not have any side effects that she can tell.   Leg pain- X rays at [facility] showed arthritis and possible mild fracture. Pt reports that she still has pain in this leg and is still about the same.   Prior to Admission medications   Medication Sig Start Date End Date Taking? Authorizing Provider  alendronate (FOSAMAX) 70 MG tablet Take 1 tablet (70 mg total) by mouth once a week. Take with a full glass of water on an empty stomach. 08/11/15  Yes Emersynn Deatley Maceo Pro., MD  clobetasol ointment (TEMOVATE) [id number] % Apply 1 application topically 2 (two) times daily. Apply to affected area twice a day for 2 weeks, then once a day for 4 weeks 08/12/15  Yes Alanda Slim Defrancesco, MD  meloxicam (MOBIC) 7.5 MG tablet Take 1 tablet (7.5 mg total) by mouth daily. Patient not taking: Reported on 09/22/2015 08/03/15   Jerrol Banana., MD    Patient Active Problem List   Diagnosis Date Noted  . Lichen sclerosus [id number]  . Postmenopausal bleeding 08/12/2015  . Osteoporosis 08/11/2015  . Allergic rhinitis 04/14/2015  . Blood pressure elevated 04/14/2015  . 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 Acid reflux 04/14/2015  . 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 Hypercholesterolemia 04/14/2015  . BP (high blood pressure) 04/14/2015  . Adiposity 04/14/2015  . Arthritis, degenerative 04/14/2015  . Osteopenia 04/14/2015  . Awareness of heartbeats 04/14/2015    Past Medical History  Diagnosis Date  . Osteopenia   . Lichen sclerosus     Social History   Social History  . Marital Status: Married    Spouse Name: N/A  . Number of Children: N/A  . Years of Education: N/A    Occupational History  . Not on file.   Social History Main Topics  . Smoking status: Never Smoker   . Smokeless tobacco: Never Used  . Alcohol Use: 0.0 oz/week    0 Standard drinks or equivalent per week     Comment: rare  . Drug Use: No  . Sexual Activity: No   Other Topics Concern  . Not on file   Social History Narrative    No Known Allergies  Review of Systems  Constitutional: Negative.   HENT: Negative.   Eyes: Negative.   Respiratory: Negative.   Cardiovascular: Negative.   Gastrointestinal: Negative.   Genitourinary: Negative.   Musculoskeletal: Positive for joint pain.  Skin: Negative.   Neurological: Negative.   Endo/Heme/Allergies: Negative.   Psychiatric/Behavioral: Negative.     Immunization History  Administered Date(s) Administered  . Influenza-Unspecified 02/13/2014  . Pneumococcal Conjugate-13 03/24/2014  . Pneumococcal Polysaccharide-23 03/04/1998  . Td 03/06/2008  . Zoster 07/30/2007   Objective:  BP 124/68 mmHg  Pulse 74  Temp(Src) 97.5 F (36.4 C) (Oral)  Resp 16  Wt 158 lb (71.668 kg)  Physical Exam  Constitutional: She is oriented to person, place, and time and well-developed, well-nourished, and in no distress.  Eyes: Conjunctivae and EOM are normal. Pupils are equal, round, and reactive to light.  Neck: Normal range of motion. Neck supple.  Cardiovascular: Normal rate,  regular rhythm, normal heart sounds and intact distal pulses.   Pulmonary/Chest: Effort normal and breath sounds normal.  Musculoskeletal: Normal range of motion.   Minimal tenderness over her lower spine.  Neurological: She is alert and oriented to person, place, and time. She has normal reflexes. Gait normal. GCS score is 15.  Skin: Skin is warm and dry.  Psychiatric: Mood, memory, affect and judgment normal.    Lab Results  Component Value Date   WBC 4.5 07/30/2015   HGB 14.8 07/22/2014   HCT 43.8 07/30/2015   PLT 233 07/30/2015   GLUCOSE 100*  07/30/2015   CHOL 190 07/30/2015   TRIG 66 07/30/2015   HDL 79 07/30/2015   LDLCALC 98 07/30/2015   TSH 3.73 07/22/2014   HGBA1C 6.1* 07/30/2015    CMP     Component Value Date/Time   NA 140 07/30/2015 0849   K 4.7 07/30/2015 0849   CL 102 07/30/2015 0849   CO2 22 07/30/2015 0849   GLUCOSE 100* 07/30/2015 0849   BUN 20 07/30/2015 0849   CREATININE 0.88 07/30/2015 0849   CREATININE 0.9 07/22/2014   CALCIUM 9.3 07/30/2015 0849   PROT 6.3 07/30/2015 0849   ALBUMIN 4.1 07/30/2015 0849   AST 13 07/30/2015 0849   ALT 7 07/30/2015 0849   ALKPHOS 81 07/30/2015 0849   BILITOT 0.5 07/30/2015 0849   GFRNONAA 61 07/30/2015 0849   GFRAA 70 07/30/2015 0849    Assessment and Plan :  1. Osteoporosis Will take Fosamax for 5 years. D/c in 2022. - alendronate (FOSAMAX) 70 MG tablet; Take 1 tablet (70 mg total) by mouth once a week. Take with a full glass of water on an empty stomach.  Dispense: 4 tablet; Refill: 12  2. Osteoarthritis, unspecified osteoarthritis type, unspecified site   3. Compression fracture Likely old.  4. Right-sided low back pain without sciatica DDD vs.  The Compression fracture. May need referral to neurosurgereon in the future. Pt wants to wait right now.     Patient was seen and examined by Dr. Miguel Aschoff, and noted scribed by Webb Laws, [place] MD [facility] 09/22/2015 8:35 AM

## 2015-09-23 ENCOUNTER — Ambulatory Visit: Payer: PPO | Admitting: Obstetrics and Gynecology

## 2015-10-08 DIAGNOSIS — L820 Inflamed seborrheic keratosis: Secondary | ICD-10-CM | POA: Diagnosis not present

## 2015-10-08 DIAGNOSIS — L570 Actinic keratosis: Secondary | ICD-10-CM | POA: Diagnosis not present

## 2015-10-08 DIAGNOSIS — L578 Other skin changes due to chronic exposure to nonionizing radiation: Secondary | ICD-10-CM | POA: Diagnosis not present

## 2015-10-08 DIAGNOSIS — L821 Other seborrheic keratosis: Secondary | ICD-10-CM | POA: Diagnosis not present

## 2015-10-22 ENCOUNTER — Encounter: Payer: Self-pay | Admitting: Obstetrics and Gynecology

## 2015-10-22 ENCOUNTER — Ambulatory Visit (INDEPENDENT_AMBULATORY_CARE_PROVIDER_SITE_OTHER): Payer: PPO | Admitting: Obstetrics and Gynecology

## 2015-10-22 ENCOUNTER — Inpatient Hospital Stay: Admission: RE | Admit: 2015-10-22 | Payer: PPO | Source: Ambulatory Visit

## 2015-10-22 VITALS — BP 135/68 | HR 71 | Ht 64.0 in | Wt 158.0 lb

## 2015-10-22 DIAGNOSIS — R9389 Abnormal findings on diagnostic imaging of other specified body structures: Secondary | ICD-10-CM

## 2015-10-22 DIAGNOSIS — Z01818 Encounter for other preprocedural examination: Secondary | ICD-10-CM

## 2015-10-22 DIAGNOSIS — N950 Postmenopausal bleeding: Secondary | ICD-10-CM

## 2015-10-22 DIAGNOSIS — R938 Abnormal findings on diagnostic imaging of other specified body structures: Secondary | ICD-10-CM

## 2015-10-22 DIAGNOSIS — N840 Polyp of corpus uteri: Secondary | ICD-10-CM

## 2015-10-22 NOTE — Progress Notes (Signed)
Subjective:    PREOPERATIVE HISTORY AND PHYSICAL     Patient is a 82 y.o. No obstetric history on file.female scheduled for Hysteroscopy/D&C or evaluation of postmenopausal bleeding. Preoperative ultrasound demonstrated thickened endometrium. Previous endometrial biopsy in December 2014 demonstrated fragments of endometrial polyps.   Pertinent Gynecological History: No LMP recorded. Patient is postmenopausal. Contraception: post menopausal status  Vulvar biopsy [id number]: Lichen sclerosus Endometrial biopsy 05/01/2013-pathology: Endometrial polyp fragments without evidence of atypia or malignancy   OB History    No data available      Menarche age: NA No LMP recorded. Patient is postmenopausal.    Past Medical History  Diagnosis Date  . Osteopenia   . Lichen sclerosus     Past Surgical History  Procedure Laterality Date  . Foot surgery    . Carpal tunnel release      OB History  No data available    Social History   Social History  . Marital Status: Married    Spouse Name: N/A  . Number of Children: N/A  . Years of Education: N/A   Social History Main Topics  . Smoking status: Never Smoker   . Smokeless tobacco: Never Used  . Alcohol Use: 0.0 oz/week    0 Standard drinks or equivalent per week     Comment: rare  . Drug Use: No  . Sexual Activity: No   Other Topics Concern  . None   Social History Narrative    Family History  Problem Relation Age of Onset  . Cancer Mother   . Leukemia Mother   . Cancer Father     colon  . Hypertension Father   . Arthritis Sister   . Breast cancer Neg Hx      (Not in a hospital admission)  No Known Allergies  Review of Systems Constitutional: No recent fever/chills/sweats Respiratory: No recent cough/bronchitis Cardiovascular: No chest pain Gastrointestinal: No recent nausea/vomiting/diarrhea Genitourinary: No UTI symptoms Hematologic/lymphatic:No history of coagulopathy or recent blood thinner  use    Objective:    BP 135/68 mmHg  Pulse 71  Ht 5\' 4"  (1.626 m)  Wt 158 lb (71.668 kg)  BMI 27.11 kg/m2  General:   Normal  Skin:   normal  HEENT:  Normal  Neck:  Supple without Adenopathy or Thyromegaly  Lungs:   Heart:              Breasts:   Abdomen:  Pelvis:  M/S   Extremeties:  Neuro:    clear to auscultation bilaterally   Normal without murmur   Not Examined   soft, non-tender; bowel sounds normal; no masses,  no organomegaly   Exam deferred to OR  No CVAT  Warm/Dry   Normal       08/12/2015  PELVIC: External Genitalia:Atrophic; hypopigmentation with faint leukoplakia) posterior fourchette; no ulceration or epithelial skin breakdown [id number] caruncle Vagina:Atrophic changes; no blood in vault Cervix:No lesions Uterus: Normal size, shape,consistency, mobile Adnexa: Normal RV: Normal External exam Bladder: Nontender  Assessment:    1. Postmenopausal bleeding 2. Previous endometrial biopsy demonstrated endometrial polyps 3. Preoperative ultrasound demonstrated thickened endometrium   Plan:   Hysteroscopy/D&C  Preoperative counseling: Patient is to undergo hysteroscopy/D&C for evaluation of postmenopausal bleeding. She is understanding of the planned procedure and is aware of and is accepting of all surgical risks which include but are not limited to bleeding, infection, pelvic organ injury with need for repair, blood clot disorders, anesthesia risks, etc. All questions have been answered. Informed consent  is given. Patient is ready and willing to proceed with surgery as scheduled.  Brayton Mars, MD  Note: This dictation was prepared with Dragon dictation along with smaller phrase technology. Any transcriptional errors that result from this process are unintentional.

## 2015-10-22 NOTE — Patient Instructions (Signed)
1.  Return for postop check 1 week after surgery 

## 2015-10-22 NOTE — H&P (Signed)
Subjective:    PREOPERATIVE HISTORY AND PHYSICAL     Patient is a 82 y.o. No obstetric history on file.female scheduled for Hysteroscopy/D&C or evaluation of postmenopausal bleeding. Preoperative ultrasound demonstrated thickened endometrium. Previous endometrial biopsy in December 2014 demonstrated fragments of endometrial polyps.   Pertinent Gynecological History: No LMP recorded. Patient is postmenopausal. Contraception: post menopausal status  Vulvar biopsy [id number]: Lichen sclerosus Endometrial biopsy 05/01/2013-pathology: Endometrial polyp fragments without evidence of atypia or malignancy   OB History    No data available      Menarche age: NA No LMP recorded. Patient is postmenopausal.    Past Medical History  Diagnosis Date  . Osteopenia   . Lichen sclerosus     Past Surgical History  Procedure Laterality Date  . Foot surgery    . Carpal tunnel release      OB History  No data available    Social History   Social History  . Marital Status: Married    Spouse Name: N/A  . Number of Children: N/A  . Years of Education: N/A   Social History Main Topics  . Smoking status: Never Smoker   . Smokeless tobacco: Never Used  . Alcohol Use: 0.0 oz/week    0 Standard drinks or equivalent per week     Comment: rare  . Drug Use: No  . Sexual Activity: No   Other Topics Concern  . None   Social History Narrative    Family History  Problem Relation Age of Onset  . Cancer Mother   . Leukemia Mother   . Cancer Father     colon  . Hypertension Father   . Arthritis Sister   . Breast cancer Neg Hx      (Not in a hospital admission)  No Known Allergies  Review of Systems Constitutional: No recent fever/chills/sweats Respiratory: No recent cough/bronchitis Cardiovascular: No chest pain Gastrointestinal: No recent nausea/vomiting/diarrhea Genitourinary: No UTI symptoms Hematologic/lymphatic:No history of coagulopathy or recent blood thinner  use    Objective:    BP 135/68 mmHg  Pulse 71  Ht 5\' 4"  (1.626 m)  Wt 158 lb (71.668 kg)  BMI 27.11 kg/m2  General:   Normal  Skin:   normal  HEENT:  Normal  Neck:  Supple without Adenopathy or Thyromegaly  Lungs:   Heart:              Breasts:   Abdomen:  Pelvis:  M/S   Extremeties:  Neuro:    clear to auscultation bilaterally   Normal without murmur   Not Examined   soft, non-tender; bowel sounds normal; no masses,  no organomegaly   Exam deferred to OR  No CVAT  Warm/Dry   Normal       08/12/2015  PELVIC: External Genitalia:Atrophic; hypopigmentation with faint leukoplakia) posterior fourchette; no ulceration or epithelial skin breakdown [id number] caruncle Vagina:Atrophic changes; no blood in vault Cervix:No lesions Uterus: Normal size, shape,consistency, mobile Adnexa: Normal RV: Normal External exam Bladder: Nontender  Assessment:    1. Postmenopausal bleeding 2. Previous endometrial biopsy demonstrated endometrial polyps 3. Preoperative ultrasound demonstrated thickened endometrium   Plan:   Hysteroscopy/D&C  Preoperative counseling: Patient is to undergo hysteroscopy/D&C for evaluation of postmenopausal bleeding. She is understanding of the planned procedure and is aware of and is accepting of all surgical risks which include but are not limited to bleeding, infection, pelvic organ injury with need for repair, blood clot disorders, anesthesia risks, etc. All questions have been answered. Informed consent  is given. Patient is ready and willing to proceed with surgery as scheduled.  Brayton Mars, MD  Note: This dictation was prepared with Dragon dictation along with smaller phrase technology. Any transcriptional errors that result from this process are unintentional.

## 2015-10-27 ENCOUNTER — Encounter
Admission: RE | Admit: 2015-10-27 | Discharge: 2015-10-27 | Disposition: A | Discharge status: Home / Self Care | Payer: PPO | Source: Ambulatory Visit | Attending: Obstetrics and Gynecology | Admitting: Obstetrics and Gynecology

## 2015-10-27 DIAGNOSIS — Z01810 Encounter for preprocedural cardiovascular examination: Secondary | ICD-10-CM | POA: Insufficient documentation

## 2015-10-27 DIAGNOSIS — Z01812 Encounter for preprocedural laboratory examination: Secondary | ICD-10-CM | POA: Insufficient documentation

## 2015-10-27 DIAGNOSIS — I10 Essential (primary) hypertension: Secondary | ICD-10-CM | POA: Diagnosis not present

## 2015-10-27 LAB — CBC WITH DIFFERENTIAL/PLATELET
Basophils Absolute: 0 10*3/uL (ref 0–0.1)
Basophils Relative: 1 %
Eosinophils Absolute: 0.1 10*3/uL (ref 0–0.7)
Eosinophils Relative: 2 %
HCT: 43.2 % (ref 35.0–47.0)
Hemoglobin: 14.4 g/dL (ref 12.0–16.0)
Lymphocytes Relative: 29 %
Lymphs Abs: 1.5 10*3/uL (ref 1.0–3.6)
MCH: 31.1 pg (ref 26.0–34.0)
MCHC: 33.3 g/dL (ref 32.0–36.0)
MCV: 93.5 fL (ref 80.0–100.0)
Monocytes Absolute: 0.4 10*3/uL (ref 0.2–0.9)
Monocytes Relative: 7 %
Neutro Abs: 3.2 10*3/uL (ref 1.4–6.5)
Neutrophils Relative %: 61 %
Platelets: 204 10*3/uL (ref 150–440)
RBC: 4.62 MIL/uL (ref 3.80–5.20)
RDW: 12.8 % (ref 11.5–14.5)
WBC: 5.1 10*3/uL (ref 3.6–11.0)

## 2015-10-27 LAB — BASIC METABOLIC PANEL
Anion gap: 8 (ref 5–15)
BUN: 14 mg/dL (ref 6–20)
CO2: 26 mmol/L (ref 22–32)
Calcium: 9.4 mg/dL (ref 8.9–10.3)
Chloride: 105 mmol/L (ref 101–111)
Creatinine, Ser: 0.8 mg/dL (ref 0.44–1.00)
GFR calc Af Amer: >60 mL/min (ref >60)
GFR calc non Af Amer: >60 mL/min (ref >60)
Glucose, Bld: 91 mg/dL (ref 65–99)
Potassium: 3.9 mmol/L (ref 3.5–5.1)
Sodium: 139 mmol/L (ref 135–145)

## 2015-10-27 LAB — RAPID HIV SCREEN (HIV 1/2 AB+AG)
HIV 1/2 Antibodies: NONREACTIVE
HIV-1 P24 Antigen - HIV24: NONREACTIVE

## 2015-10-27 NOTE — Patient Instructions (Signed)
Your procedure is scheduled on: Monday 11/02/15 Report to Day Surgery. 2ND FLOOR MEDICAL MALL ENTRANCE To find out your arrival time please call [phone] between 1PM - 3PM on Friday 10/30/15.  Remember: Instructions that are not followed completely may result in serious medical risk, up to and including death, or upon the discretion of your surgeon and anesthesiologist your surgery may need to be rescheduled.    __X__ 1. Do not eat food or drink liquids after midnight. No gum chewing or hard candies.     __X__ 2. No Alcohol for 24 hours before or after surgery.   ____ 3. Bring all medications with you on the day of surgery if instructed.    __X__ 4. Notify your doctor if there is any change in your medical condition     (cold, fever, infections).     Do not wear jewelry, make-up, hairpins, clips or nail polish.  Do not wear lotions, powders, or perfumes.   Do not shave 48 hours prior to surgery. Men may shave face and neck.  Do not bring valuables to the hospital.    [facility] is not responsible for any belongings or valuables.               Contacts, dentures or bridgework may not be worn into surgery.  Leave your suitcase in the car. After surgery it may be brought to your room.  For patients admitted to the hospital, discharge time is determined by your                treatment team.   Patients discharged the day of surgery will not be allowed to drive home.   Please read over the following fact sheets that you were given:   Surgical Site Infection Prevention   ____ Take these medicines the morning of surgery with A SIP OF WATER:    1. NONE  2.   3.   4.  5.  6.  ____ Fleet Enema (as directed)   __X__ Use CHG Soap as directed  ____ Use inhalers on the day of surgery  ____ Stop metformin 2 days prior to surgery    ____ Take 1/2 of usual insulin dose the night before surgery and none on the morning of surgery.   ____ Stop Coumadin/Plavix/aspirin on   ____ Stop  Anti-inflammatories on    ____ Stop supplements until after surgery.    ____ Bring C-Pap to the hospital.

## 2015-10-28 LAB — RPR: RPR Ser Ql: NONREACTIVE

## 2015-10-28 NOTE — Pre-Procedure Instructions (Signed)
ekg compared with ekg from 2009 and anterior infarct noted 2009

## 2015-11-02 ENCOUNTER — Ambulatory Visit
Admission: RE | Admit: 2015-11-02 | Discharge: 2015-11-02 | Disposition: A | Discharge status: Home / Self Care | Payer: PPO | Source: Ambulatory Visit | Attending: Obstetrics and Gynecology | Admitting: Obstetrics and Gynecology

## 2015-11-02 ENCOUNTER — Ambulatory Visit: Payer: PPO | Admitting: Anesthesiology

## 2015-11-02 ENCOUNTER — Encounter
Admission: RE | Disposition: A | Discharge status: Home / Self Care | Payer: Self-pay | Source: Ambulatory Visit | Attending: Obstetrics and Gynecology

## 2015-11-02 ENCOUNTER — Encounter: Payer: Self-pay | Admitting: *Deleted

## 2015-11-02 DIAGNOSIS — R938 Abnormal findings on diagnostic imaging of other specified body structures: Secondary | ICD-10-CM

## 2015-11-02 DIAGNOSIS — M8580 Other specified disorders of bone density and structure, unspecified site: Secondary | ICD-10-CM | POA: Insufficient documentation

## 2015-11-02 DIAGNOSIS — Z9889 Other specified postprocedural states: Secondary | ICD-10-CM | POA: Diagnosis not present

## 2015-11-02 DIAGNOSIS — Z8261 Family history of arthritis: Secondary | ICD-10-CM | POA: Diagnosis not present

## 2015-11-02 DIAGNOSIS — Z806 Family history of leukemia: Secondary | ICD-10-CM | POA: Diagnosis not present

## 2015-11-02 DIAGNOSIS — K219 Gastro-esophageal reflux disease without esophagitis: Secondary | ICD-10-CM | POA: Insufficient documentation

## 2015-11-02 DIAGNOSIS — N950 Postmenopausal bleeding: Secondary | ICD-10-CM | POA: Diagnosis not present

## 2015-11-02 DIAGNOSIS — Z800 Family history of malignant neoplasm of digestive organs: Secondary | ICD-10-CM | POA: Insufficient documentation

## 2015-11-02 DIAGNOSIS — I10 Essential (primary) hypertension: Secondary | ICD-10-CM | POA: Insufficient documentation

## 2015-11-02 DIAGNOSIS — N904 Leukoplakia of vulva: Secondary | ICD-10-CM | POA: Insufficient documentation

## 2015-11-02 DIAGNOSIS — Z8249 Family history of ischemic heart disease and other diseases of the circulatory system: Secondary | ICD-10-CM | POA: Diagnosis not present

## 2015-11-02 DIAGNOSIS — N840 Polyp of corpus uteri: Secondary | ICD-10-CM | POA: Diagnosis not present

## 2015-11-02 DIAGNOSIS — M1990 Unspecified osteoarthritis, unspecified site: Secondary | ICD-10-CM | POA: Insufficient documentation

## 2015-11-02 HISTORY — PX: HYSTEROSCOPY WITH D & C: SHX1775

## 2015-11-02 SURGERY — DILATATION AND CURETTAGE /HYSTEROSCOPY
Anesthesia: General | Wound class: Clean Contaminated

## 2015-11-02 MED ORDER — ONDANSETRON HCL 4 MG/2ML IJ SOLN
INTRAMUSCULAR | Status: DC | PRN
  Administered 2015-11-02: 4 mg via INTRAVENOUS

## 2015-11-02 MED ORDER — LABETALOL HCL 5 MG/ML IV SOLN
INTRAVENOUS | Status: DC | PRN
  Administered 2015-11-02: 5 mg via INTRAVENOUS

## 2015-11-02 MED ORDER — PROPOFOL 10 MG/ML IV BOLUS
INTRAVENOUS | Status: DC | PRN
  Administered 2015-11-02: 110 mg via INTRAVENOUS

## 2015-11-02 MED ORDER — FAMOTIDINE 20 MG PO TABS
ORAL_TABLET | ORAL | Status: AC
  Filled 2015-11-02: qty 1

## 2015-11-02 MED ORDER — ONDANSETRON HCL 4 MG/2ML IJ SOLN: 4.0000 mg | Freq: Once | INTRAMUSCULAR | Status: DC | PRN

## 2015-11-02 MED ORDER — FAMOTIDINE 20 MG PO TABS
20.0000 mg | ORAL_TABLET | Freq: Once | ORAL | Status: AC
  Administered 2015-11-02: 20 mg via ORAL

## 2015-11-02 MED ORDER — IBUPROFEN 600 MG PO TABS: 600.0000 mg | ORAL_TABLET | Freq: Three times a day (TID) | ORAL | Status: DC

## 2015-11-02 MED ORDER — FENTANYL CITRATE (PF) 100 MCG/2ML IJ SOLN: 25.0000 ug | INTRAMUSCULAR | Status: DC | PRN

## 2015-11-02 MED ORDER — OXYCODONE-ACETAMINOPHEN 5-325 MG PO TABS: 1.0000 | ORAL_TABLET | ORAL | Status: DC | PRN

## 2015-11-02 MED ORDER — FENTANYL CITRATE (PF) 100 MCG/2ML IJ SOLN
INTRAMUSCULAR | Status: DC | PRN
  Administered 2015-11-02 (×2): 50 ug via INTRAVENOUS

## 2015-11-02 MED ORDER — LACTATED RINGERS IV SOLN
INTRAVENOUS | Status: DC
  Administered 2015-11-02: 16:00:00 via INTRAVENOUS

## 2015-11-02 MED ORDER — LACTATED RINGERS IV SOLN: INTRAVENOUS | Status: DC

## 2015-11-02 MED ORDER — LIDOCAINE HCL (CARDIAC) 20 MG/ML IV SOLN
INTRAVENOUS | Status: DC | PRN
  Administered 2015-11-02: 80 mg via INTRAVENOUS

## 2015-11-02 MED ORDER — DEXAMETHASONE SODIUM PHOSPHATE 10 MG/ML IJ SOLN
INTRAMUSCULAR | Status: DC | PRN
  Administered 2015-11-02: 5 mg via INTRAVENOUS

## 2015-11-02 SURGICAL SUPPLY — 11 items
CATH ROBINSON RED A/P 16FR (CATHETERS) ×2 IMPLANT
GLOVE BIO SURGEON STRL SZ8 (GLOVE) ×2 IMPLANT
GOWN STRL REUS W/ TWL LRG LVL3 (GOWN DISPOSABLE) ×1 IMPLANT
GOWN STRL REUS W/ TWL XL LVL3 (GOWN DISPOSABLE) ×1 IMPLANT
GOWN STRL REUS W/TWL LRG LVL3 (GOWN DISPOSABLE) ×1
GOWN STRL REUS W/TWL XL LVL3 (GOWN DISPOSABLE) ×1
IV LACTATED RINGERS 1000ML (IV SOLUTION) ×2 IMPLANT
KIT RM TURNOVER CYSTO AR (KITS) ×2 IMPLANT
PACK DNC HYST (MISCELLANEOUS) ×2 IMPLANT
PAD OB MATERNITY 4.3X12.25 (PERSONAL CARE ITEMS) ×2 IMPLANT
PAD PREP 24X41 OB/GYN DISP (PERSONAL CARE ITEMS) ×2 IMPLANT

## 2015-11-02 NOTE — Discharge Instructions (Signed)

## 2015-11-02 NOTE — Anesthesia Procedure Notes (Signed)
Procedure Name: LMA Insertion Date/Time: 11/02/2015 4:56 PM Performed by: Aline Brochure Pre-anesthesia Checklist: Patient identified, Emergency Drugs available, Suction available and Patient being monitored Patient Re-evaluated:Patient Re-evaluated prior to inductionOxygen Delivery Method: Circle system utilized Preoxygenation: Pre-oxygenation with 100% oxygen Intubation Type: IV induction Ventilation: Mask ventilation without difficulty LMA: LMA inserted LMA Size: 3.5 Number of attempts: 1 Placement Confirmation: positive ETCO2 Tube secured with: Tape

## 2015-11-02 NOTE — H&P (View-Only) (Signed)
Subjective:    PREOPERATIVE HISTORY AND PHYSICAL     Patient is a 82 y.o. No obstetric history on file.female scheduled for Hysteroscopy/D&C or evaluation of postmenopausal bleeding. Preoperative ultrasound demonstrated thickened endometrium. Previous endometrial biopsy in December 2014 demonstrated fragments of endometrial polyps.   Pertinent Gynecological History: No LMP recorded. Patient is postmenopausal. Contraception: post menopausal status  Vulvar biopsy [id number]: Lichen sclerosus Endometrial biopsy 05/01/2013-pathology: Endometrial polyp fragments without evidence of atypia or malignancy   OB History    No data available      Menarche age: NA No LMP recorded. Patient is postmenopausal.    Past Medical History  Diagnosis Date  . Osteopenia   . Lichen sclerosus     Past Surgical History  Procedure Laterality Date  . Foot surgery    . Carpal tunnel release      OB History  No data available    Social History   Social History  . Marital Status: Married    Spouse Name: N/A  . Number of Children: N/A  . Years of Education: N/A   Social History Main Topics  . Smoking status: Never Smoker   . Smokeless tobacco: Never Used  . Alcohol Use: 0.0 oz/week    0 Standard drinks or equivalent per week     Comment: rare  . Drug Use: No  . Sexual Activity: No   Other Topics Concern  . None   Social History Narrative    Family History  Problem Relation Age of Onset  . Cancer Mother   . Leukemia Mother   . Cancer Father     colon  . Hypertension Father   . Arthritis Sister   . Breast cancer Neg Hx      (Not in a hospital admission)  No Known Allergies  Review of Systems Constitutional: No recent fever/chills/sweats Respiratory: No recent cough/bronchitis Cardiovascular: No chest pain Gastrointestinal: No recent nausea/vomiting/diarrhea Genitourinary: No UTI symptoms Hematologic/lymphatic:No history of coagulopathy or recent blood thinner  use    Objective:    BP 135/68 mmHg  Pulse 71  Ht 5\' 4"  (1.626 m)  Wt 158 lb (71.668 kg)  BMI 27.11 kg/m2  General:   Normal  Skin:   normal  HEENT:  Normal  Neck:  Supple without Adenopathy or Thyromegaly  Lungs:   Heart:              Breasts:   Abdomen:  Pelvis:  M/S   Extremeties:  Neuro:    clear to auscultation bilaterally   Normal without murmur   Not Examined   soft, non-tender; bowel sounds normal; no masses,  no organomegaly   Exam deferred to OR  No CVAT  Warm/Dry   Normal       08/12/2015  PELVIC: External Genitalia:Atrophic; hypopigmentation with faint leukoplakia) posterior fourchette; no ulceration or epithelial skin breakdown [id number] caruncle Vagina:Atrophic changes; no blood in vault Cervix:No lesions Uterus: Normal size, shape,consistency, mobile Adnexa: Normal RV: Normal External exam Bladder: Nontender  Assessment:    1. Postmenopausal bleeding 2. Previous endometrial biopsy demonstrated endometrial polyps 3. Preoperative ultrasound demonstrated thickened endometrium   Plan:   Hysteroscopy/D&C  Preoperative counseling: Patient is to undergo hysteroscopy/D&C for evaluation of postmenopausal bleeding. She is understanding of the planned procedure and is aware of and is accepting of all surgical risks which include but are not limited to bleeding, infection, pelvic organ injury with need for repair, blood clot disorders, anesthesia risks, etc. All questions have been answered. Informed consent  is given. Patient is ready and willing to proceed with surgery as scheduled.  Brayton Mars, MD  Note: This dictation was prepared with Dragon dictation along with smaller phrase technology. Any transcriptional errors that result from this process are unintentional.

## 2015-11-02 NOTE — Interval H&P Note (Signed)
History and Physical Interval Note:  11/02/2015 4:23 PM  Deanna Williams  has presented today for surgery, with the diagnosis of POSTMENOPAUSAL BLEEDING, THICKENED ENDOMETRIUM, ENDOMETRIAL POLYP  The various methods of treatment have been discussed with the patient and family. After consideration of risks, benefits and other options for treatment, the patient has consented to  Procedure(s): DILATATION AND CURETTAGE /HYSTEROSCOPY (N/A) as a surgical intervention .  The patient's history has been reviewed, patient examined, no change in status, stable for surgery.  I have reviewed the patient's chart and labs.  Questions were answered to the patient's satisfaction.     Hassell Done A Meena Barrantes

## 2015-11-02 NOTE — Transfer of Care (Signed)
Immediate Anesthesia Transfer of Care Note  Patient: Deanna Williams  Procedure(s) Performed: Procedure(s): DILATATION AND CURETTAGE /HYSTEROSCOPY (N/A)  Patient Location: PACU  Anesthesia Type:General  Level of Consciousness: sedated  Airway & Oxygen Therapy: Patient connected to face mask oxygen  Post-op Assessment: Post -op Vital signs reviewed and stable  Post vital signs: stable  Last Vitals:  Filed Vitals:   11/02/15 1506 11/02/15 1740  BP: 168/75 176/90  Pulse: 64 65  Temp: 36.5 C 36.1 C  Resp: 16 12    Last Pain:  Filed Vitals:   11/02/15 1743  PainSc: Asleep         Complications: No apparent anesthesia complications

## 2015-11-02 NOTE — Op Note (Signed)
OPERATIVE NOTE  Date of surgery: 11/02/2015  Preoperative diagnosis:  1. PMB 2. Thickened Endometrium on U/S  Postoperative diagnosis: 1. PMB 2. Endometrial Polyp  Operative procedure: 1. Hysteroscopy 2. Dilation and curettage of endometrium  Surgeon: Dr. Zipporah Plants Assistant: Arlyss Gandy Anesthesia: General   Findings: Pelvis: Gynecoid Uterus: sounded to7 cm Normal appearing endometrial cavity with 0./5 x 1.5 cm polyp  Description of procedure Patient was brought to the operating room where she was placed in supine position. General anesthesia was induced without difficulty. The patient was placed in dorsal lithotomy position using candy cane stirrups. A betadine perineal intravaginal prep and drape was performed in standard fashion. Weighted speculum was placed in the vagina. Single-tooth tenaculum was placed on the anterior cervix. Uterus was sounded to 7 cm. Hanks dilators were used up to a #20 [place] caliber to dilate the endocervical canal. The [facility] hysteroscope using lactated Ringer's as irrigant was used for the hysteroscopy. The above-noted findings were photo documented. The hysteroscope removed. The smooth and serrated curettes were then used to curettage the endometrial cavity with production of average tissue. Stone polyp forceps were used to grasp and remove the polyp. Repeat hysteroscopy was performed and demonstrated excellent sampling. Procedure was then terminated with all instrumentation being removed from the vagina. The patient was then awakened mobilized and taken to the recovery room in satisfactory condition.  IV fluids:per Anesthesia worksheet  Urine output: 150 mL. EBL: 10 mL.  Instruments, needles, and sponge counts were verified as correct.  Brayton Mars, MD 11/02/2015

## 2015-11-02 NOTE — Anesthesia Preprocedure Evaluation (Signed)
Anesthesia Evaluation  Patient identified by MRN, date of birth, ID band Patient awake    Reviewed: Allergy & Precautions, NPO status , Patient's Chart, lab work & pertinent test results  Airway Mallampati: II       Dental  (+) Caps, Chipped   Pulmonary neg pulmonary ROS,    Pulmonary exam normal breath sounds clear to auscultation       Cardiovascular hypertension, Normal cardiovascular exam     Neuro/Psych negative neurological ROS     GI/Hepatic Neg liver ROS, GERD  Controlled,  Endo/Other  negative endocrine ROS  Renal/GU negative Renal ROS  negative genitourinary   Musculoskeletal  (+) Arthritis , Osteoarthritis,  osteopenia   Abdominal Normal abdominal exam  (+)   Peds negative pediatric ROS (+)  Hematology negative hematology ROS (+)   Anesthesia Other Findings Patient denies any issue with reflux  Reproductive/Obstetrics                             Anesthesia Physical Anesthesia Plan  ASA: II  Anesthesia Plan: General   Post-op Pain Management:    Induction: Intravenous  Airway Management Planned: LMA  Additional Equipment:   Intra-op Plan:   Post-operative Plan: Extubation in OR  Informed Consent: I have reviewed the patients History and Physical, chart, labs and discussed the procedure including the risks, benefits and alternatives for the proposed anesthesia with the patient or authorized representative who has indicated his/her understanding and acceptance.   Dental advisory given  Plan Discussed with: CRNA and Surgeon  Anesthesia Plan Comments:         Anesthesia Quick Evaluation

## 2015-11-02 NOTE — Anesthesia Postprocedure Evaluation (Signed)
Anesthesia Post Note  Patient: Deanna Williams  Procedure(s) Performed: Procedure(s) (LRB): DILATATION AND CURETTAGE /HYSTEROSCOPY (N/A)  Patient location during evaluation: PACU Anesthesia Type: General Level of consciousness: awake and alert and oriented Pain management: pain level controlled Vital Signs Assessment: post-procedure vital signs reviewed and stable Respiratory status: spontaneous breathing Cardiovascular status: blood pressure returned to baseline Anesthetic complications: no    Last Vitals:  Filed Vitals:   11/02/15 1810 11/02/15 1822  BP: 185/90 191/84  Pulse: 65 74  Temp: 36.3 C 35.8 C  Resp: 14 16    Last Pain:  Filed Vitals:   11/02/15 1823  PainSc: 5                  Enas Winchel

## 2015-11-03 ENCOUNTER — Encounter: Payer: Self-pay | Admitting: Obstetrics and Gynecology

## 2015-11-04 LAB — SURGICAL PATHOLOGY

## 2015-11-11 ENCOUNTER — Ambulatory Visit (INDEPENDENT_AMBULATORY_CARE_PROVIDER_SITE_OTHER): Payer: PPO | Admitting: Obstetrics and Gynecology

## 2015-11-11 ENCOUNTER — Encounter: Payer: Self-pay | Admitting: Obstetrics and Gynecology

## 2015-11-11 VITALS — BP 145/70 | HR 75 | Ht 64.0 in | Wt 159.2 lb

## 2015-11-11 DIAGNOSIS — N840 Polyp of corpus uteri: Secondary | ICD-10-CM | POA: Insufficient documentation

## 2015-11-11 DIAGNOSIS — Z09 Encounter for follow-up examination after completed treatment for conditions other than malignant neoplasm: Secondary | ICD-10-CM

## 2015-11-11 DIAGNOSIS — Z9889 Other specified postprocedural states: Secondary | ICD-10-CM

## 2015-11-11 NOTE — Patient Instructions (Signed)
1. Resume all activities without restriction 2. Follow-up as needed

## 2015-11-11 NOTE — Progress Notes (Signed)
Chief complaint: 1. One week postop check 2. Status post hysteroscopy/D&C  Patient is doing well post surgery. No significant pelvic pain or vaginal bleeding. Bowel bladder function are normal.  Pathology: Benign endometrial polyp  OBJECTIVE: BP 145/70 mmHg  Pulse 75  Ht 5\' 4"  (1.626 m)  Wt 159 lb 3.2 oz (72.213 kg)  BMI 27.31 kg/m2 Physical exam deferred  ASSESSMENT: 1. One week postop check status post hysteroscopy/D&C for endometrial polyp  PLAN: 1. Resume all activities without restriction 2. Follow-up as needed  Brayton Mars, MD  Note: This dictation was prepared with Dragon dictation along with smaller phrase technology. Any transcriptional errors that result from this process are unintentional.

## 2016-01-20 DIAGNOSIS — L820 Inflamed seborrheic keratosis: Secondary | ICD-10-CM | POA: Diagnosis not present

## 2016-01-20 DIAGNOSIS — L304 Erythema intertrigo: Secondary | ICD-10-CM | POA: Diagnosis not present

## 2016-01-20 DIAGNOSIS — L578 Other skin changes due to chronic exposure to nonionizing radiation: Secondary | ICD-10-CM | POA: Diagnosis not present

## 2016-01-20 DIAGNOSIS — L309 Dermatitis, unspecified: Secondary | ICD-10-CM | POA: Diagnosis not present

## 2016-01-20 DIAGNOSIS — L821 Other seborrheic keratosis: Secondary | ICD-10-CM | POA: Diagnosis not present

## 2016-02-18 DIAGNOSIS — I8311 Varicose veins of right lower extremity with inflammation: Secondary | ICD-10-CM | POA: Diagnosis not present

## 2016-02-18 DIAGNOSIS — L817 Pigmented purpuric dermatosis: Secondary | ICD-10-CM | POA: Diagnosis not present

## 2016-02-18 DIAGNOSIS — L578 Other skin changes due to chronic exposure to nonionizing radiation: Secondary | ICD-10-CM | POA: Diagnosis not present

## 2016-02-18 DIAGNOSIS — L304 Erythema intertrigo: Secondary | ICD-10-CM | POA: Diagnosis not present

## 2016-02-18 DIAGNOSIS — I8312 Varicose veins of left lower extremity with inflammation: Secondary | ICD-10-CM | POA: Diagnosis not present

## 2016-02-18 DIAGNOSIS — L309 Dermatitis, unspecified: Secondary | ICD-10-CM | POA: Diagnosis not present

## 2016-02-18 DIAGNOSIS — L821 Other seborrheic keratosis: Secondary | ICD-10-CM | POA: Diagnosis not present

## 2016-02-18 DIAGNOSIS — L812 Freckles: Secondary | ICD-10-CM | POA: Diagnosis not present

## 2016-02-18 DIAGNOSIS — L820 Inflamed seborrheic keratosis: Secondary | ICD-10-CM | POA: Diagnosis not present

## 2016-03-24 ENCOUNTER — Ambulatory Visit (INDEPENDENT_AMBULATORY_CARE_PROVIDER_SITE_OTHER): Payer: PPO | Admitting: Family Medicine

## 2016-03-24 ENCOUNTER — Encounter: Payer: Self-pay | Admitting: Family Medicine

## 2016-03-24 VITALS — BP 124/68 | HR 78 | Temp 97.8°F | Resp 16 | Wt 160.0 lb

## 2016-03-24 DIAGNOSIS — R739 Hyperglycemia, unspecified: Secondary | ICD-10-CM | POA: Diagnosis not present

## 2016-03-24 DIAGNOSIS — M810 Age-related osteoporosis without current pathological fracture: Secondary | ICD-10-CM

## 2016-03-24 LAB — POCT GLYCOSYLATED HEMOGLOBIN (HGB A1C): Hemoglobin A1C: 6.1

## 2016-03-24 NOTE — Progress Notes (Signed)
Subjective:  HPI  Pre- Diabetes, Follow-up:   Lab Results  Component Value Date   HGBA1C 6.1 (H) 07/30/2015   HGBA1C 6.1 (A) 07/22/2014   Last seen for pre- diabetes 6 months ago.  Management since then includes none. Current symptoms include none and have been unchanged. Home blood sugar records: not being checked  Current exercise: walking, 3-5 miles daily.  Pertinent Labs:    Component Value Date/Time   CHOL 190 07/30/2015 0849   TRIG 66 07/30/2015 0849   HDL 79 07/30/2015 0849   LDLCALC 98 07/30/2015 0849   CREATININE 0.80 10/27/2015 1149    Wt Readings from Last 3 Encounters:  03/24/16 160 lb (72.6 kg)  11/11/15 159 lb 3.2 oz (72.2 kg)  11/02/15 158 lb (71.7 kg)    ------------------------------------------------------------------------  Osteoporosis- Pt is taking Fosamax with out side effects.    Prior to Admission medications   Medication Sig Start Date End Date Taking? Authorizing Provider  alendronate (FOSAMAX) 70 MG tablet Take 1 tablet (70 mg total) by mouth once a week. Take with a full glass of water on an empty stomach. 09/22/15   Shermika Balthaser Maceo Pro., MD  ibuprofen (ADVIL,MOTRIN) 600 MG tablet Take 1 tablet (600 mg total) by mouth 3 (three) times daily. 11/02/15   Brayton Mars, MD    Patient Active Problem List   Diagnosis Date Noted  . Endometrial polyp 11/11/2015  . Lichen sclerosus 99991111  . Osteoporosis 08/11/2015  . Allergic rhinitis 04/14/2015  . Blood pressure elevated 04/14/2015  . 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 Acid reflux 04/14/2015  . 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 Hypercholesterolemia 04/14/2015  . BP (high blood pressure) 04/14/2015  . Adiposity 04/14/2015  . Arthritis, degenerative 04/14/2015  . Osteopenia 04/14/2015  . Awareness of heartbeats 04/14/2015    Past Medical History:  Diagnosis Date  . Lichen sclerosus   . Osteopenia     Social History   Social History  . Marital status: Married   Spouse name: N/A  . Number of children: N/A  . Years of education: N/A   Occupational History  . Not on file.   Social History Main Topics  . Smoking status: Never Smoker  . Smokeless tobacco: Never Used  . Alcohol use 0.0 oz/week     Comment: rare  . Drug use: No  . Sexual activity: No   Other Topics Concern  . Not on file   Social History Narrative  . No narrative on file    No Known Allergies  Review of Systems  Constitutional: Negative.   HENT: Negative.   Eyes: Negative.   Respiratory: Negative.   Cardiovascular: Negative.   Gastrointestinal: Negative.   Genitourinary: Negative.   Musculoskeletal: Negative.   Skin: Negative.   Neurological: Negative.   Endo/Heme/Allergies: Negative.   Psychiatric/Behavioral: Negative.     Immunization History  Administered Date(s) Administered  . Influenza, High Dose Seasonal PF 02/11/2016  . Influenza-Unspecified 02/13/2014  . Pneumococcal Conjugate-13 03/24/2014  . Pneumococcal Polysaccharide-23 03/04/1998  . Td 03/06/2008  . Zoster 07/30/2007    Objective:  BP 124/68 (BP Location: Left Arm, Patient Position: Sitting, Cuff Size: Normal)   Pulse 78   Temp 97.8 F (36.6 C) (Oral)   Resp 16   Wt 160 lb (72.6 kg)   BMI 27.46 kg/m   Physical Exam  Constitutional: She is oriented to person, place, and time and well-developed, well-nourished, and in no distress.  She is well-nourished well-developed white female who  appears much younger than her 82 years of age  HENT:  Head: Normocephalic and atraumatic.  Eyes: Conjunctivae and EOM are normal. Pupils are equal, round, and reactive to light.  Neck: Normal range of motion. Neck supple.  Cardiovascular: Normal rate, regular rhythm, normal heart sounds and intact distal pulses.   Pulmonary/Chest: Effort normal and breath sounds normal.  Abdominal: Soft.  Musculoskeletal: Normal range of motion.  Neurological: She is alert and oriented to person, place, and time. She  has normal reflexes. Gait normal. GCS score is 15.  Skin: Skin is warm and dry.  Psychiatric: Mood, memory, affect and judgment normal.   Diabetic Foot Exam - Simple   Simple Foot Form Diabetic Foot exam was performed with the following findings:  Yes 03/24/2016  8:49 AM  Visual Inspection No deformities, no ulcerations, no other skin breakdown bilaterally:  Yes Sensation Testing Intact to touch and monofilament testing bilaterally:  Yes Pulse Check Posterior Tibialis and Dorsalis pulse intact bilaterally:  Yes Comments     Lab Results  Component Value Date   WBC 5.1 10/27/2015   HGB 14.4 10/27/2015   HCT 43.2 10/27/2015   PLT 204 10/27/2015   GLUCOSE 91 10/27/2015   CHOL 190 07/30/2015   TRIG 66 07/30/2015   HDL 79 07/30/2015   LDLCALC 98 07/30/2015   TSH 3.73 07/22/2014   HGBA1C 6.1 (H) 07/30/2015    CMP     Component Value Date/Time   NA 139 10/27/2015 1149   NA 140 07/30/2015 0849   K 3.9 10/27/2015 1149   CL 105 10/27/2015 1149   CO2 26 10/27/2015 1149   GLUCOSE 91 10/27/2015 1149   BUN 14 10/27/2015 1149   BUN 20 07/30/2015 0849   CREATININE 0.80 10/27/2015 1149   CALCIUM 9.4 10/27/2015 1149   PROT 6.3 07/30/2015 0849   ALBUMIN 4.1 07/30/2015 0849   AST 13 07/30/2015 0849   ALT 7 07/30/2015 0849   ALKPHOS 81 07/30/2015 0849   BILITOT 0.5 07/30/2015 0849   GFRNONAA >60 10/27/2015 1149   GFRAA >60 10/27/2015 1149    Assessment and Plan :  1. Osteoporosis, unspecified osteoporosis type, unspecified pathological fracture presence Stay on Fosamax for another 4 years for a total of 5 years.   2. Hyperglycemia/Prediabetes  - POCT HgB A1C 6.1 today. Stable.    HPI, Exam, and A&P Transcribed under the direction and in the presence of Roselia Snipe L. Cranford Mon, MD  Electronically Signed: Webb Laws, CMA I have done the exam and reviewed the above chart and it is accurate to the best of my knowledge.  Miguel Aschoff MD [facility] 03/24/2016 8:25 AM

## 2016-04-20 ENCOUNTER — Telehealth: Payer: Self-pay | Admitting: Family Medicine

## 2016-04-20 NOTE — Telephone Encounter (Signed)
Insurance- [place] Pt to schedule AWV with [facility] @9 :45AM ON 1/11 move CPE to 10:30 and change appt type to 30 minute OV put in notes CPE - knb

## 2016-04-26 ENCOUNTER — Encounter: Payer: PPO | Admitting: Family Medicine

## 2016-05-26 ENCOUNTER — Encounter: Payer: PPO | Admitting: Family Medicine

## 2016-05-26 ENCOUNTER — Ambulatory Visit: Payer: PPO | Admitting: Family Medicine

## 2016-05-26 ENCOUNTER — Ambulatory Visit (INDEPENDENT_AMBULATORY_CARE_PROVIDER_SITE_OTHER): Payer: PPO

## 2016-05-26 VITALS — BP 129/74 | HR 72 | Temp 99.1°F | Resp 16 | Ht 63.0 in | Wt 158.0 lb

## 2016-05-26 VITALS — BP 129/74 | HR 72 | Temp 99.1°F | Ht 64.0 in | Wt 158.4 lb

## 2016-05-26 DIAGNOSIS — M810 Age-related osteoporosis without current pathological fracture: Secondary | ICD-10-CM

## 2016-05-26 DIAGNOSIS — R7303 Prediabetes: Secondary | ICD-10-CM | POA: Diagnosis not present

## 2016-05-26 DIAGNOSIS — Z0000 Encounter for general adult medical examination without abnormal findings: Secondary | ICD-10-CM | POA: Diagnosis not present

## 2016-05-26 NOTE — Progress Notes (Signed)
Subjective:   Deanna Williams is a 83 y.o. female who presents for Medicare Annual (Subsequent) preventive examination.  Review of Systems:  N/A  Cardiac Risk Factors include: advanced age (>33men, >15 women);dyslipidemia;hypertension     Objective:     Vitals: BP 129/74 (BP Location: Right Arm)   Pulse 72   Temp 99.1 F (37.3 C) (Oral)   Ht 5\' 4"  (1.626 m)   Wt 158 lb 6.4 oz (71.8 kg)   BMI 27.19 kg/m   Body mass index is 27.19 kg/m.   Tobacco History  Smoking Status  . Never Smoker  Smokeless Tobacco  . Never Used     Counseling given: Not Answered   Past Medical History:  Diagnosis Date  . Gastroesophageal reflux disease   . Hyperlipidemia   . Hypertension   . Lichen sclerosus   . Lichen sclerosus et atrophicus   . Osteopenia   . Palpitations    Past Surgical History:  Procedure Laterality Date  . CARPAL TUNNEL RELEASE    . FOOT SURGERY    . HYSTEROSCOPY W/D&C N/A 11/02/2015   Procedure: DILATATION AND CURETTAGE /HYSTEROSCOPY;  Surgeon: Brayton Mars, MD;  Location: [facility] ORS;  Service: Gynecology;  Laterality: N/A;   Family History  Problem Relation Age of Onset  . Cancer Mother   . Leukemia Mother   . Cancer Father     colon  . Hypertension Father   . Arthritis Sister   . Breast cancer Neg Hx    History  Sexual Activity  . Sexual activity: No    Outpatient Encounter Prescriptions as of 05/26/2016  Medication Sig  . alendronate (FOSAMAX) 70 MG tablet Take 1 tablet (70 mg total) by mouth once a week. Take with a full glass of water on an empty stomach.   No facility-administered encounter medications on file as of 05/26/2016.     Activities of Daily Living In your present state of health, do you have any difficulty performing the following activities: 05/26/2016 10/27/2015  Hearing? Y N  Vision? N N  Difficulty concentrating or making decisions? N N  Walking or climbing stairs? N N  Dressing or bathing? N N  Doing errands,  shopping? N N  Preparing Food and eating ? N -  Using the Toilet? N -  In the past six months, have you accidently leaked urine? Y -  Do you have problems with loss of bowel control? N -  Managing your Medications? N -  Managing your Finances? N -  Housekeeping or managing your Housekeeping? N -  Some recent data might be hidden    Patient Care Team: Jerrol Banana., MD as PCP - General (Family Medicine)    Assessment:     Exercise Activities and Dietary recommendations Current Exercise Habits: Home exercise routine, Type of exercise: walking, Time (Minutes): 20, Frequency (Times/Week): 7, Weekly Exercise (Minutes/Week): 140, Intensity: Mild  Goals    . Increase water intake          Starting 05/26/16, I will increase my water intake to 4 glasses daily.       Fall Risk Fall Risk  05/26/2016 04/21/2015  Falls in the past year? No No   Depression Screen PHQ 2/9 Scores 05/26/2016 04/21/2015  PHQ - 2 Score 1 0     Cognitive Function        Immunization History  Administered Date(s) Administered  . Influenza, High Dose Seasonal PF 02/11/2016  . Influenza-Unspecified 02/13/2014  .  Pneumococcal Conjugate-13 03/24/2014  . Pneumococcal Polysaccharide-23 03/04/1998  . Td 03/06/2008  . Zoster 07/30/2007   Screening Tests Health Maintenance  Topic Date Due  . TETANUS/TDAP  03/06/2018  . INFLUENZA VACCINE  Completed  . DEXA SCAN  Completed  . ZOSTAVAX  Completed  . PNA vac Low Risk Adult  Completed      Plan:  I have personally reviewed and addressed the Medicare Annual Wellness questionnaire and have noted the following in the patient's chart:  A. Medical and social history B. Use of alcohol, tobacco or illicit drugs  C. Current medications and supplements D. Functional ability and status E.  Nutritional status F.  Physical activity G. Advance directives H. List of other physicians I.  Hospitalizations, surgeries, and ER visits in previous 12 months J.    [place] such as hearing and vision if needed, cognitive and depression L. Referrals and appointments - none  In addition, I have reviewed and discussed with patient certain preventive protocols, quality metrics, and best practice recommendations. A written personalized care plan for preventive services as well as general preventive health recommendations were provided to patient.  See attached scanned questionnaire for additional information.   Signed,  Fabio Neighbors, LPN Nurse Health Advisor   MD Recommendations: None.  I have reviewed the health advisors note, was  available for consultation and I agree with documentation and plan. Miguel Aschoff MD [facility]

## 2016-05-26 NOTE — Patient Instructions (Signed)
Health Maintenance, Female Introduction Adopting a healthy lifestyle and getting preventive care can go a long way to promote health and wellness. Talk with your health care provider about what schedule of regular examinations is right for you. This is a good chance for you to check in with your provider about disease prevention and staying healthy. In between checkups, there are plenty of things you can do on your own. Experts have done a lot of research about which lifestyle changes and preventive measures are most likely to keep you healthy. Ask your health care provider for more information. Weight and diet Eat a healthy diet  Be sure to include plenty of vegetables, fruits, low-fat dairy products, and lean protein.  Do not eat a lot of foods high in solid fats, added sugars, or salt.  Get regular exercise. This is one of the most important things you can do for your health.  Most adults should exercise for at least 150 minutes each week. The exercise should increase your heart rate and make you sweat (moderate-intensity exercise).  Most adults should also do strengthening exercises at least twice a week. This is in addition to the moderate-intensity exercise. Maintain a healthy weight  Body mass index (BMI) is a measurement that can be used to identify possible weight problems. It estimates body fat based on height and weight. Your health care provider can help determine your BMI and help you achieve or maintain a healthy weight.  For females 4 years of age and older:  A BMI below 18.5 is considered underweight.  A BMI of 18.5 to 24.9 is normal.  A BMI of 25 to 29.9 is considered overweight.  A BMI of 30 and above is considered obese. Watch levels of cholesterol and blood lipids  You should start having your blood tested for lipids and cholesterol at 81 years of age, then have this test every 5 years.  You may need to have your cholesterol levels checked more often  if:  Your lipid or cholesterol levels are high.  You are older than 81 years of age.  You are at high risk for heart disease. Cancer screening Lung Cancer  Lung cancer screening is recommended for adults 12-31 years old who are at high risk for lung cancer because of a history of smoking.  A yearly low-dose CT scan of the lungs is recommended for people who:  Currently smoke.  Have quit within the past 15 years.  Have at least a 30-pack-year history of smoking. A pack year is smoking an average of one pack of cigarettes a day for 1 year.  Yearly screening should continue until it has been 15 years since you quit.  Yearly screening should stop if you develop a health problem that would prevent you from having lung cancer treatment. Breast Cancer  Practice breast self-awareness. This means understanding how your breasts normally appear and feel.  It also means doing regular breast self-exams. Let your health care provider know about any changes, no matter how small.  If you are in your 20s or 30s, you should have a clinical breast exam (CBE) by a health care provider every 1-3 years as part of a regular health exam.  If you are 74 or older, have a CBE every year. Also consider having a breast X-ray (mammogram) every year.  If you have a family history of breast cancer, talk to your health care provider about genetic screening.  If you are at high risk for breast cancer,  talk to your health care provider about having an MRI and a mammogram every year.  Breast cancer gene (BRCA) assessment is recommended for women who have family members with BRCA-related cancers. BRCA-related cancers include:  Breast.  Ovarian.  Tubal.  Peritoneal cancers.  Results of the assessment will determine the need for genetic counseling and BRCA1 and BRCA2 testing. Colorectal Cancer  This type of cancer can be detected and often prevented.  Routine colorectal cancer screening usually begins  at 81 years of age and continues through 81 years of age.  Your health care provider may recommend screening at an earlier age if you have risk factors for colon cancer.  Your health care provider may also recommend using home test kits to check for hidden blood in the stool.  A small camera at the end of a tube can be used to examine your colon directly (sigmoidoscopy or colonoscopy). This is done to check for the earliest forms of colorectal cancer.  Routine screening usually begins at age 50.  Direct examination of the colon should be repeated every 5-10 years through 81 years of age. However, you may need to be screened more often if early forms of precancerous polyps or small growths are found. Skin Cancer  Check your skin from head to toe regularly.  Tell your health care provider about any new moles or changes in moles, especially if there is a change in a mole's shape or color.  Also tell your health care provider if you have a mole that is larger than the size of a pencil eraser.  Always use sunscreen. Apply sunscreen liberally and repeatedly throughout the day.  Protect yourself by wearing long sleeves, pants, a wide-brimmed hat, and sunglasses whenever you are outside. Heart disease, diabetes, and high blood pressure  High blood pressure causes heart disease and increases the risk of stroke. High blood pressure is more likely to develop in:  People who have blood pressure in the high end of the normal range (130-139/85-89 mm Hg).  People who are overweight or obese.  People who are African American.  If you are 18-39 years of age, have your blood pressure checked every 3-5 years. If you are 40 years of age or older, have your blood pressure checked every year. You should have your blood pressure measured twice-once when you are at a hospital or clinic, and once when you are not at a hospital or clinic. Record the average of the two measurements. To check your blood pressure  when you are not at a hospital or clinic, you can use:  An automated blood pressure machine at a pharmacy.  A home blood pressure monitor.  If you are between 55 years and 79 years old, ask your health care provider if you should take aspirin to prevent strokes.  Have regular diabetes screenings. This involves taking a blood sample to check your fasting blood sugar level.  If you are at a normal weight and have a low risk for diabetes, have this test once every three years after 81 years of age.  If you are overweight and have a high risk for diabetes, consider being tested at a younger age or more often. Preventing infection Hepatitis B  If you have a higher risk for hepatitis B, you should be screened for this virus. You are considered at high risk for hepatitis B if:  You were born in a country where hepatitis B is common. Ask your health care provider which countries are   considered high risk.  Your parents were born in a high-risk country, and you have not been immunized against hepatitis B (hepatitis B vaccine).  You have HIV or AIDS.  You use needles to inject street drugs.  You live with someone who has hepatitis B.  You have had sex with someone who has hepatitis B.  You get hemodialysis treatment.  You take certain medicines for conditions, including cancer, organ transplantation, and autoimmune conditions. Hepatitis C  Blood testing is recommended for:  Everyone born from 1945 through 1965.  Anyone with known risk factors for hepatitis C. Osteoporosis and menopause  Osteoporosis is a disease in which the bones lose minerals and strength with aging. This can result in serious bone fractures. Your risk for osteoporosis can be identified using a bone density scan.  If you are 65 years of age or older, or if you are at risk for osteoporosis and fractures, ask your health care provider if you should be screened.  Ask your health care provider whether you should take  a calcium or vitamin D supplement to lower your risk for osteoporosis.  Menopause may have certain physical symptoms and risks.  Hormone replacement therapy may reduce some of these symptoms and risks. Talk to your health care provider about whether hormone replacement therapy is right for you. Follow these instructions at home:  Schedule regular health, dental, and eye exams.  Stay current with your immunizations.  Do not use any tobacco products including cigarettes, chewing tobacco, or electronic cigarettes.  If you are pregnant, do not drink alcohol.  If you are breastfeeding, limit how much and how often you drink alcohol.  Limit alcohol intake to no more than 1 drink per day for nonpregnant women. One drink equals 12 ounces of beer, 5 ounces of wine, or 1 ounces of hard liquor.  Do not use street drugs.  Do not share needles.  Ask your health care provider for help if you need support or information about quitting drugs.  Tell your health care provider if you often feel depressed.  Tell your health care provider if you have ever been abused or do not feel safe at home. This information is not intended to replace advice given to you by your health care provider. Make sure you discuss any questions you have with your health care provider. Document Released: 11/15/2010 Document Revised: 10/08/2015 Document Reviewed: 02/03/2015  2017 [employer]  

## 2016-05-26 NOTE — Progress Notes (Signed)
Patient: Deanna Williams, Female    DOB: 10-02-1928, 83 y.o.   MRN: [id number] Visit Date: 05/26/2016  Today's Provider: [name], MD   Chief Complaint  Patient presents with  . Annual Exam   Subjective:  Deanna Williams is a 83 y.o. female who presents today for health maintenance and complete physical. She feels well. She reports exercising . She reports she is sleeping well. Patient feels very well. She now lives in independent living at [facility]. She has been widowed for 7 years. She has one son, 5 grandchildren, 64 great-grandchildren, and 3 great great grandchildren. She has prediabetes and osteoporosis and is doing well with these issues. .  Immunization History  Administered Date(s) Administered  . Influenza, High Dose Seasonal PF 02/11/2016  . Influenza-Unspecified 02/13/2014  . Pneumococcal Conjugate-13 03/24/2014  . Pneumococcal Polysaccharide-23 03/04/1998  . Td 03/06/2008  . Zoster 07/30/2007   06/18/02 Colonoscopy-normal 08/11/15 BMD-Osteopenia and osteoporosis 05/07/15 Mammogram 10/16/07 Pap-normal  Review of Systems  Constitutional: Negative.   HENT: Positive for hearing loss and sinus pressure.   Eyes: Negative.   Respiratory: Positive for cough.   Cardiovascular: Negative.   Gastrointestinal: Negative.   Endocrine: Negative.   Genitourinary: Negative.   Musculoskeletal: Positive for back pain.  Skin: Negative.   Allergic/Immunologic: Negative.   Neurological: Negative.   Hematological: Negative.   Psychiatric/Behavioral: Negative.     Social History   Social History  . Marital status: Married    Spouse name: N/A  . Number of children: N/A  . Years of education: N/A   Occupational History  . Not on file.   Social History Main Topics  . Smoking status: Never Smoker  . Smokeless tobacco: Never Used  . Alcohol use 0.0 oz/week     Comment: rare- wine  . Drug use: No  . Sexual activity: No   Other Topics Concern  . Not on file    Social History Narrative  . No narrative on file    Patient Active Problem List   Diagnosis Date Noted  . Endometrial polyp 11/11/2015  . Lichen sclerosus 99991111  . Osteoporosis 08/11/2015  . Allergic rhinitis 04/14/2015  . Blood pressure elevated 04/14/2015  . 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 Acid reflux 04/14/2015  . 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 Hypercholesterolemia 04/14/2015  . BP (high blood pressure) 04/14/2015  . Adiposity 04/14/2015  . Arthritis, degenerative 04/14/2015  . Osteopenia 04/14/2015  . Awareness of heartbeats 04/14/2015    Past Surgical History:  Procedure Laterality Date  . CARPAL TUNNEL RELEASE    . FOOT SURGERY    . HYSTEROSCOPY W/D&C N/A 11/02/2015   Procedure: DILATATION AND CURETTAGE /HYSTEROSCOPY;  Surgeon: Brayton Mars, MD;  Location: [facility] ORS;  Service: Gynecology;  Laterality: N/A;    Her family history includes Arthritis in her sister; Cancer in her father and mother; Hypertension in her father; Leukemia in her mother.     Outpatient Encounter Prescriptions as of 05/26/2016  Medication Sig  . alendronate (FOSAMAX) 70 MG tablet Take 1 tablet (70 mg total) by mouth once a week. Take with a full glass of water on an empty stomach.   No facility-administered encounter medications on file as of 05/26/2016.     Patient Care Team: Jerrol Banana., MD as PCP - General (Family Medicine) Estill Cotta, MD as Consulting Physician (Ophthalmology) Ralene Bathe, MD as Consulting Physician (Dermatology)      Objective:   Vitals:  Vitals:   05/26/16  1013  BP: 129/74  Pulse: 72  Resp: 16  Temp: 99.1 F (37.3 C)  TempSrc: Oral  Weight: 158 lb (71.7 kg)  Height: 5\' 3"  (1.6 m)    Physical Exam  Constitutional: She is oriented to person, place, and time. She appears well-developed and well-nourished.  HENT:  Head: Normocephalic and atraumatic.  Right Ear: External ear normal.  Left Ear: External ear normal.   Nose: Nose normal.  Mouth/Throat: Oropharynx is clear and moist.  Eyes: Conjunctivae and EOM are normal. Pupils are equal, round, and reactive to light.  Neck: Normal range of motion. Neck supple.  Cardiovascular: Normal rate, regular rhythm, normal heart sounds and intact distal pulses.   Pulmonary/Chest: Effort normal and breath sounds normal.  Abdominal: Soft. Bowel sounds are normal.  Musculoskeletal: Normal range of motion.  Neurological: She is alert and oriented to person, place, and time.  Skin: Skin is warm and dry.  Psychiatric: She has a normal mood and affect. Her behavior is normal. Judgment and thought content normal.     Depression Screen PHQ 2/9 Scores 05/26/2016 04/21/2015  PHQ - 2 Score 1 0      Assessment & Plan:     Routine Health Maintenance and Physical Exam  Exercise Activities and Dietary recommendations Goals    . Increase water intake          Starting 05/26/16, I will increase my water intake to 4 glasses daily.        Immunization History  Administered Date(s) Administered  . Influenza, High Dose Seasonal PF 02/11/2016  . Influenza-Unspecified 02/13/2014  . Pneumococcal Conjugate-13 03/24/2014  . Pneumococcal Polysaccharide-23 03/04/1998  . Td 03/06/2008  . Zoster 07/30/2007    Health Maintenance  Topic Date Due  . Samul Dada  03/06/2018  . INFLUENZA VACCINE  Completed  . DEXA SCAN  Completed  . ZOSTAVAX  Completed  . PNA vac Low Risk Adult  Completed     Discussed health benefits of physical activity, and encouraged her to engage in regular exercise appropriate for her age and condition.  Prediabetes Osteoporosis Continue alendronate for a total of 5 years.   I have done the exam and reviewed the chart and it is accurate to the best of my knowledge. Development worker, community has been used and  any errors in dictation or transcription are unintentional. Miguel Aschoff M.D. [facility]

## 2016-05-27 LAB — CBC WITH DIFFERENTIAL/PLATELET
Basophils Absolute: 0 10*3/uL (ref 0.0–0.2)
Basos: 0 %
EOS (ABSOLUTE): 0.1 10*3/uL (ref 0.0–0.4)
Eos: 2 %
Hematocrit: 42.1 % (ref 34.0–46.6)
Hemoglobin: 13.9 g/dL (ref 11.1–15.9)
Immature Grans (Abs): 0 10*3/uL (ref 0.0–0.1)
Immature Granulocytes: 0 %
Lymphocytes Absolute: 1.7 10*3/uL (ref 0.7–3.1)
Lymphs: 36 %
MCH: 30.8 pg (ref 26.6–33.0)
MCHC: 33 g/dL (ref 31.5–35.7)
MCV: 93 fL (ref 79–97)
Monocytes Absolute: 0.4 10*3/uL (ref 0.1–0.9)
Monocytes: 8 %
Neutrophils Absolute: 2.6 10*3/uL (ref 1.4–7.0)
Neutrophils: 54 %
Platelets: 244 10*3/uL (ref 150–379)
RBC: 4.52 x10E6/uL (ref 3.77–5.28)
RDW: 13.2 % (ref 12.3–15.4)
WBC: 4.8 10*3/uL (ref 3.4–10.8)

## 2016-05-27 LAB — COMPREHENSIVE METABOLIC PANEL
ALT: 9 IU/L (ref 0–32)
AST: 16 IU/L (ref 0–40)
Albumin/Globulin Ratio: 1.9 (ref 1.2–2.2)
Albumin: 4.2 g/dL (ref 3.5–4.7)
Alkaline Phosphatase: 62 IU/L (ref 39–117)
BUN/Creatinine Ratio: 13 (ref 12–28)
BUN: 11 mg/dL (ref 8–27)
Bilirubin Total: 0.4 mg/dL (ref 0.0–1.2)
CO2: 25 mmol/L (ref 18–29)
Calcium: 9.7 mg/dL (ref 8.7–10.3)
Chloride: 102 mmol/L (ref 96–106)
Creatinine, Ser: 0.82 mg/dL (ref 0.57–1.00)
GFR calc Af Amer: 75 mL/min/{1.73_m2} (ref >59)
GFR calc non Af Amer: 65 mL/min/{1.73_m2} (ref >59)
Globulin, Total: 2.2 g/dL (ref 1.5–4.5)
Glucose: 93 mg/dL (ref 65–99)
Potassium: 4.6 mmol/L (ref 3.5–5.2)
Sodium: 142 mmol/L (ref 134–144)
Total Protein: 6.4 g/dL (ref 6.0–8.5)

## 2016-05-27 LAB — TSH: TSH: 2.71 u[IU]/mL (ref 0.450–4.500)

## 2016-05-27 NOTE — Progress Notes (Signed)
Pagtient advised  ED

## 2016-07-19 ENCOUNTER — Ambulatory Visit (INDEPENDENT_AMBULATORY_CARE_PROVIDER_SITE_OTHER): Payer: PPO | Admitting: Family Medicine

## 2016-07-19 ENCOUNTER — Encounter: Payer: Self-pay | Admitting: Family Medicine

## 2016-07-19 VITALS — BP 142/80 | HR 80 | Temp 97.7°F | Resp 16 | Wt 159.0 lb

## 2016-07-19 DIAGNOSIS — J0100 Acute maxillary sinusitis, unspecified: Secondary | ICD-10-CM | POA: Diagnosis not present

## 2016-07-19 MED ORDER — AMOXICILLIN-POT CLAVULANATE 875-125 MG PO TABS: 1.0000 | ORAL_TABLET | Freq: Two times a day (BID) | ORAL | 0 refills | Status: DC

## 2016-07-19 NOTE — Progress Notes (Signed)
Subjective:  HPI Pt is here for sinus congestion and fatigue. She reports that it started about a week ago. She has had sinus pain and pressure, post nasal drainage. She denies cough, fevers chills or worsening shortness of breath. She reports that she has tried sudafed but she has not taken any since the weekend. She reports that she has no energy since this started and she just wants to lay around and this is very unlike her.   Prior to Admission medications   Medication Sig Start Date End Date Taking? Authorizing Provider  alendronate (FOSAMAX) 70 MG tablet Take 1 tablet (70 mg total) by mouth once a week. Take with a full glass of water on an empty stomach. 09/22/15   Jessika Rothery Maceo Pro., MD    Patient Active Problem List   Diagnosis Date Noted  . Endometrial polyp 11/11/2015  . Lichen sclerosus 99991111  . Osteoporosis 08/11/2015  . Allergic rhinitis 04/14/2015  . Blood pressure elevated 04/14/2015  . 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 Acid reflux 04/14/2015  . 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 Hypercholesterolemia 04/14/2015  . BP (high blood pressure) 04/14/2015  . Adiposity 04/14/2015  . Arthritis, degenerative 04/14/2015  . Osteopenia 04/14/2015  . Awareness of heartbeats 04/14/2015    Past Medical History:  Diagnosis Date  . Gastroesophageal reflux disease   . Hyperlipidemia   . Hypertension   . Lichen sclerosus   . Lichen sclerosus et atrophicus   . Osteopenia   . Palpitations     Social History   Social History  . Marital status: Married    Spouse name: N/A  . Number of children: N/A  . Years of education: N/A   Occupational History  . Not on file.   Social History Main Topics  . Smoking status: Never Smoker  . Smokeless tobacco: Never Used  . Alcohol use 0.0 oz/week     Comment: rare- wine  . Drug use: No  . Sexual activity: No   Other Topics Concern  . Not on file   Social History Narrative  . No narrative on file    No Known  Allergies  Review of Systems  Constitutional: Positive for malaise/fatigue.  HENT: Positive for congestion and sinus pain.   Eyes: Negative.   Respiratory: Positive for shortness of breath (not worse than normal).   Cardiovascular: Negative.   Gastrointestinal: Negative.   Genitourinary: Negative.   Musculoskeletal: Negative.   Skin: Negative.   Neurological: Positive for dizziness and headaches.  Endo/Heme/Allergies: Negative.   Psychiatric/Behavioral: Negative.     Immunization History  Administered Date(s) Administered  . Influenza, High Dose Seasonal PF 02/11/2016  . Influenza-Unspecified 02/13/2014  . Pneumococcal Conjugate-13 03/24/2014  . Pneumococcal Polysaccharide-23 03/04/1998  . Td 03/06/2008  . Zoster 07/30/2007    Objective:  BP (!) 142/80 (BP Location: Left Arm, Patient Position: Sitting, Cuff Size: Normal)   Pulse 80   Temp 97.7 F (36.5 C) (Oral)   Resp 16   Wt 159 lb (72.1 kg)   SpO2 96%   BMI 28.17 kg/m   Physical Exam  Constitutional: She is oriented to person, place, and time and well-developed, well-nourished, and in no distress.  HENT:  Head: Normocephalic and atraumatic.  Right Ear: External ear normal.  Left Ear: External ear normal.  Nose: Nose normal.  Mouth/Throat: Oropharynx is clear and moist.  Maxillary and ethmoid tenderness  Eyes: Conjunctivae and EOM are normal. Pupils are equal, round, and reactive to light.  Neck:  Normal range of motion. Neck supple.  Cardiovascular: Normal rate, regular rhythm, normal heart sounds and intact distal pulses.   Pulmonary/Chest: Effort normal and breath sounds normal.  Abdominal: Soft.  Musculoskeletal: Normal range of motion.  Neurological: She is alert and oriented to person, place, and time. She has normal reflexes. Gait normal. GCS score is 15.  Skin: Skin is warm and dry.  Psychiatric: Mood, memory, affect and judgment normal.    Lab Results  Component Value Date   WBC 4.8 05/26/2016    HGB 14.4 10/27/2015   HCT 42.1 05/26/2016   PLT 244 05/26/2016   GLUCOSE 93 05/26/2016   CHOL 190 07/30/2015   TRIG 66 07/30/2015   HDL 79 07/30/2015   LDLCALC 98 07/30/2015   TSH 2.710 05/26/2016   HGBA1C 6.1 03/24/2016    CMP     Component Value Date/Time   NA 142 05/26/2016 1054   K 4.6 05/26/2016 1054   CL 102 05/26/2016 1054   CO2 25 05/26/2016 1054   GLUCOSE 93 05/26/2016 1054   GLUCOSE 91 10/27/2015 1149   BUN 11 05/26/2016 1054   CREATININE 0.82 05/26/2016 1054   CALCIUM 9.7 05/26/2016 1054   PROT 6.4 05/26/2016 1054   ALBUMIN 4.2 05/26/2016 1054   AST 16 05/26/2016 1054   ALT 9 05/26/2016 1054   ALKPHOS 62 05/26/2016 1054   BILITOT 0.4 05/26/2016 1054   GFRNONAA 65 05/26/2016 1054   GFRAA 75 05/26/2016 1054    Assessment and Plan :  1. Acute maxillary sinusitis, recurrence not specified Rest, and plenty of fluids. Call if not improving.  - amoxicillin-clavulanate (AUGMENTIN) 875-125 MG tablet; Take 1 tablet by mouth 2 (two) times daily.  Dispense: 14 tablet; Refill: 0  HPI, Exam, and A&P Transcribed under the direction and in the presence of Aleiyah Halpin L. Cranford Mon, MD  Electronically Signed: Katina Dung, CMA I have done the exam and reviewed the above chart and it is accurate to the best of my knowledge. Development worker, community has been used in this note in any air is in the dictation or transcription are unintentional.  [facility] 07/19/2016 9:59 AM

## 2016-07-19 NOTE — Patient Instructions (Signed)
Rest and plenty of fluids.

## 2016-08-30 NOTE — Progress Notes (Signed)
Patient: Deanna Williams Female    DOB: 01-03-1929   83 y.o.   MRN: [id number] Visit Date: 08/31/2016  Today's Provider: [name], MD   Chief Complaint  Patient presents with  . Hyperglycemia  . Osteoporosis  . Cough   Subjective:    Cough  This is a recurrent problem. The problem has been waxing and waning. The cough is non-productive. Associated symptoms include postnasal drip, rhinorrhea and wheezing. Pertinent negatives include no chest pain, chills, ear congestion, ear pain, fever, headaches, heartburn, hemoptysis, nasal congestion, sore throat, shortness of breath, sweats or weight loss. Exacerbated by: going outside. She has tried nothing for the symptoms.        Hyperglycemia, Follow-up:   Lab Results  Component Value Date   HGBA1C 6.1 03/24/2016   HGBA1C 6.1 (H) 07/30/2015   HGBA1C 6.1 (A) 07/22/2014   GLUCOSE 93 05/26/2016   GLUCOSE 91 10/27/2015   GLUCOSE 100 (H) 07/30/2015    Last seen for for this 5 months ago.  Management since then includes none. Current symptoms include paresthesia of the feet, polydipsia and polyuria and have been unchanged.  Weight trend: stable Current diet: well balanced Current exercise: walks dog for 3-5 miles daily  Pertinent Labs:    Component Value Date/Time   CHOL 190 07/30/2015 0849   TRIG 66 07/30/2015 0849   CREATININE 0.82 05/26/2016 1054    Wt Readings from Last 3 Encounters:  08/31/16 159 lb (72.1 kg)  07/19/16 159 lb (72.1 kg)  05/26/16 158 lb (71.7 kg)    Follow up for Osteoporosis  The patient was last seen for this 5 months ago. Changes made at last visit include continuing Fosamax for 5 years. Pt started Fosamax in March, 2017.  She reports excellent compliance with treatment. She is not having side effects.   ------------------------------------------------------------------------------------    No Known Allergies   Current Outpatient Prescriptions:  .  alendronate (FOSAMAX)  70 MG tablet, Take 1 tablet (70 mg total) by mouth once a week. Take with a full glass of water on an empty stomach., Disp: 4 tablet, Rfl: 12  Review of Systems  Constitutional: Negative for activity change, appetite change, chills, diaphoresis, fatigue, fever, unexpected weight change and weight loss.  HENT: Positive for postnasal drip, rhinorrhea and voice change. Negative for ear pain and sore throat.   Eyes: Negative.   Respiratory: Positive for cough and wheezing. Negative for hemoptysis and shortness of breath.   Cardiovascular: Negative for chest pain, palpitations and leg swelling.  Gastrointestinal: Negative.  Negative for heartburn.  Endocrine: Positive for polydipsia and polyuria. Negative for polyphagia.  Musculoskeletal: Negative.   Allergic/Immunologic: Negative.   Neurological: Negative for headaches.  Psychiatric/Behavioral: Negative.     Social History  Substance Use Topics  . Smoking status: Never Smoker  . Smokeless tobacco: Never Used  . Alcohol use 0.0 oz/week     Comment: rare- wine   Objective:   BP 110/60 (BP Location: Left Arm, Patient Position: Sitting, Cuff Size: Normal)   Pulse 80   Temp 97.9 F (36.6 C) (Oral)   Resp 16   Wt 159 lb (72.1 kg)   BMI 28.17 kg/m  Vitals:   08/31/16 0810  BP: 110/60  Pulse: 80  Resp: 16  Temp: 97.9 F (36.6 C)  TempSrc: Oral  Weight: 159 lb (72.1 kg)     Physical Exam  Constitutional: She is oriented to person, place, and time. She appears well-developed and  well-nourished.  HENT:  Head: Normocephalic and atraumatic.  Right Ear: External ear normal.  Left Ear: External ear normal.  Nose: Nose normal.  Mouth/Throat: Oropharynx is clear and moist.  Eyes: Conjunctivae are normal.  Neck: Normal range of motion.  Cardiovascular: Normal rate, regular rhythm and normal heart sounds.   Pulmonary/Chest: Effort normal and breath sounds normal. No respiratory distress.  Abdominal: Soft.  Musculoskeletal: She  exhibits no edema.  Lymphadenopathy:    She has no cervical adenopathy.  Neurological: She is alert and oriented to person, place, and time.  Skin: Skin is warm and dry.  Psychiatric: She has a normal mood and affect. Her behavior is normal. Judgment and thought content normal.        Assessment & Plan:     1. Hyperglycemia/prediabetes Stable. Advised pt to continue healthy lifestyle. - POCT glycosylated hemoglobin (Hb A1C) Results for orders placed or performed in visit on 08/31/16  POCT glycosylated hemoglobin (Hb A1C)  Result Value Ref Range   Hemoglobin A1C 6.0    Est. average glucose Bld gHb Est-mCnc 126     2. Seasonal allergic rhinitis due to pollen Most likely cause of cough. Start medications as below. If not improving by mid May, will reassess. - loratadine (CLARITIN) 10 MG tablet; Take 1 tablet (10 mg total) by mouth daily.  Dispense: 30 tablet; Refill: 11 - fluticasone (FLONASE) 50 MCG/ACT nasal spray; Place 2 sprays into both nostrils daily.  Dispense: 16 g; Refill: 11  3. Age-related osteoporosis without current pathological fracture Continue Fosamax until March 2022.    Patient seen and examined by Miguel Aschoff, MD, and note scribed by Renaldo Fiddler, CMA.  Boyd [place] Cranford Mon, MD  [facility]

## 2016-08-31 ENCOUNTER — Encounter: Payer: Self-pay | Admitting: Family Medicine

## 2016-08-31 ENCOUNTER — Ambulatory Visit (INDEPENDENT_AMBULATORY_CARE_PROVIDER_SITE_OTHER): Payer: PPO | Admitting: Family Medicine

## 2016-08-31 VITALS — BP 110/60 | HR 80 | Temp 97.9°F | Resp 16 | Wt 159.0 lb

## 2016-08-31 DIAGNOSIS — M810 Age-related osteoporosis without current pathological fracture: Secondary | ICD-10-CM | POA: Diagnosis not present

## 2016-08-31 DIAGNOSIS — R739 Hyperglycemia, unspecified: Secondary | ICD-10-CM | POA: Insufficient documentation

## 2016-08-31 DIAGNOSIS — J301 Allergic rhinitis due to pollen: Secondary | ICD-10-CM | POA: Diagnosis not present

## 2016-08-31 LAB — POCT GLYCOSYLATED HEMOGLOBIN (HGB A1C)
Est. average glucose Bld gHb Est-mCnc: 126
Hemoglobin A1C: 6

## 2016-08-31 MED ORDER — FLUTICASONE PROPIONATE 50 MCG/ACT NA SUSP: 2.0000 | Freq: Every day | NASAL | 11 refills | Status: DC

## 2016-08-31 MED ORDER — LORATADINE 10 MG PO TABS: 10.0000 mg | ORAL_TABLET | Freq: Every day | ORAL | 11 refills | Status: DC

## 2016-08-31 NOTE — Patient Instructions (Addendum)
Start Loratadine and Flonase for allergies. If you are still experiencing cough in Mid May, call office.

## 2016-10-13 DIAGNOSIS — L820 Inflamed seborrheic keratosis: Secondary | ICD-10-CM | POA: Diagnosis not present

## 2016-10-13 DIAGNOSIS — L304 Erythema intertrigo: Secondary | ICD-10-CM | POA: Diagnosis not present

## 2016-10-13 DIAGNOSIS — L309 Dermatitis, unspecified: Secondary | ICD-10-CM | POA: Diagnosis not present

## 2016-10-13 DIAGNOSIS — L821 Other seborrheic keratosis: Secondary | ICD-10-CM | POA: Diagnosis not present

## 2016-10-13 DIAGNOSIS — L570 Actinic keratosis: Secondary | ICD-10-CM | POA: Diagnosis not present

## 2016-10-15 ENCOUNTER — Other Ambulatory Visit: Payer: Self-pay | Admitting: Family Medicine

## 2016-10-15 DIAGNOSIS — M810 Age-related osteoporosis without current pathological fracture: Secondary | ICD-10-CM

## 2017-01-05 ENCOUNTER — Other Ambulatory Visit: Payer: Self-pay | Admitting: Family Medicine

## 2017-01-05 DIAGNOSIS — J301 Allergic rhinitis due to pollen: Secondary | ICD-10-CM

## 2017-01-05 MED ORDER — LORATADINE 10 MG PO TABS
10.0000 mg | ORAL_TABLET | Freq: Every day | ORAL | 11 refills | Status: DC
Start: 2017-01-05 — End: 2017-01-06

## 2017-01-05 NOTE — Telephone Encounter (Signed)
[facility] faxed a request for a 90-days supply for the following medication. Thanks CC  loratadine (CLARITIN) 10 MG tablet  >Take 1 tablet ( 10 MG total ) by mouth daily.

## 2017-01-06 ENCOUNTER — Other Ambulatory Visit: Payer: Self-pay | Admitting: Family Medicine

## 2017-01-06 DIAGNOSIS — J301 Allergic rhinitis due to pollen: Secondary | ICD-10-CM

## 2017-01-06 MED ORDER — LORATADINE 10 MG PO TABS: 10.0000 mg | ORAL_TABLET | Freq: Every day | ORAL | 3 refills | Status: DC

## 2017-01-06 NOTE — Telephone Encounter (Signed)
[facility] faxed a request 90-days supply for the following medication. Thanks CC  loratadine (CLARITIN) 10 MG tablet  >Take 1 tablet ( 10 MG Total) by mouth daily.

## 2017-02-22 DIAGNOSIS — L309 Dermatitis, unspecified: Secondary | ICD-10-CM | POA: Diagnosis not present

## 2017-02-22 DIAGNOSIS — L570 Actinic keratosis: Secondary | ICD-10-CM | POA: Diagnosis not present

## 2017-02-22 DIAGNOSIS — L719 Rosacea, unspecified: Secondary | ICD-10-CM | POA: Diagnosis not present

## 2017-02-22 DIAGNOSIS — D225 Melanocytic nevi of trunk: Secondary | ICD-10-CM | POA: Diagnosis not present

## 2017-02-22 DIAGNOSIS — L578 Other skin changes due to chronic exposure to nonionizing radiation: Secondary | ICD-10-CM | POA: Diagnosis not present

## 2017-02-22 DIAGNOSIS — L821 Other seborrheic keratosis: Secondary | ICD-10-CM | POA: Diagnosis not present

## 2017-02-22 DIAGNOSIS — L304 Erythema intertrigo: Secondary | ICD-10-CM | POA: Diagnosis not present

## 2017-02-22 DIAGNOSIS — L812 Freckles: Secondary | ICD-10-CM | POA: Diagnosis not present

## 2017-02-22 DIAGNOSIS — L299 Pruritus, unspecified: Secondary | ICD-10-CM | POA: Diagnosis not present

## 2017-02-22 DIAGNOSIS — Z1283 Encounter for screening for malignant neoplasm of skin: Secondary | ICD-10-CM | POA: Diagnosis not present

## 2017-03-07 ENCOUNTER — Ambulatory Visit (INDEPENDENT_AMBULATORY_CARE_PROVIDER_SITE_OTHER): Payer: PPO | Admitting: Family Medicine

## 2017-03-07 VITALS — BP 114/66 | HR 84 | Temp 97.5°F | Resp 14 | Wt 163.8 lb

## 2017-03-07 DIAGNOSIS — R739 Hyperglycemia, unspecified: Secondary | ICD-10-CM

## 2017-03-07 DIAGNOSIS — J301 Allergic rhinitis due to pollen: Secondary | ICD-10-CM

## 2017-03-07 DIAGNOSIS — M810 Age-related osteoporosis without current pathological fracture: Secondary | ICD-10-CM | POA: Diagnosis not present

## 2017-03-07 DIAGNOSIS — Z23 Encounter for immunization: Secondary | ICD-10-CM

## 2017-03-07 DIAGNOSIS — R300 Dysuria: Secondary | ICD-10-CM | POA: Diagnosis not present

## 2017-03-07 LAB — COMPLETE METABOLIC PANEL WITH GFR
AG Ratio: 1.9 (calc) (ref 1.0–2.5)
ALT: 8 U/L (ref 6–29)
AST: 12 U/L (ref 10–35)
Albumin: 3.8 g/dL (ref 3.6–5.1)
Alkaline phosphatase (APISO): 61 U/L (ref 33–130)
BUN: 11 mg/dL (ref 7–25)
CO2: 25 mmol/L (ref 20–32)
Calcium: 9.3 mg/dL (ref 8.6–10.4)
Chloride: 107 mmol/L (ref 98–110)
Creat: 0.82 mg/dL (ref 0.60–0.88)
GFR, Est African American: 75 mL/min/{1.73_m2} (ref >=60)
GFR, Est Non African American: 65 mL/min/{1.73_m2} (ref >=60)
Globulin: 2 g/dL (calc) (ref 1.9–3.7)
Glucose, Bld: 126 mg/dL (ref 65–139)
Potassium: 4.1 mmol/L (ref 3.5–5.3)
Sodium: 139 mmol/L (ref 135–146)
Total Bilirubin: 0.5 mg/dL (ref 0.2–1.2)
Total Protein: 5.8 g/dL — ABNORMAL LOW (ref 6.1–8.1)

## 2017-03-07 LAB — CBC WITH DIFFERENTIAL/PLATELET
Basophils Absolute: 18 cells/uL (ref 0–200)
Basophils Relative: 0.4 %
Eosinophils Absolute: 59 cells/uL (ref 15–500)
Eosinophils Relative: 1.3 %
HCT: 41.4 % (ref 35.0–45.0)
Hemoglobin: 13.9 g/dL (ref 11.7–15.5)
Lymphs Abs: 1161 cells/uL (ref 850–3900)
MCH: 30.7 pg (ref 27.0–33.0)
MCHC: 33.6 g/dL (ref 32.0–36.0)
MCV: 91.4 fL (ref 80.0–100.0)
MPV: 10.9 fL (ref 7.5–12.5)
Monocytes Relative: 8.2 %
Neutro Abs: 2894 cells/uL (ref 1500–7800)
Neutrophils Relative %: 64.3 %
Platelets: 228 10*3/uL (ref 140–400)
RBC: 4.53 10*6/uL (ref 3.80–5.10)
RDW: 12 % (ref 11.0–15.0)
Total Lymphocyte: 25.8 %
WBC mixed population: 369 cells/uL (ref 200–950)
WBC: 4.5 10*3/uL (ref 3.8–10.8)

## 2017-03-07 LAB — POCT URINALYSIS DIPSTICK
Bilirubin, UA: NEGATIVE
Blood, UA: NEGATIVE
Glucose, UA: NEGATIVE
Ketones, UA: NEGATIVE
Leukocytes, UA: NEGATIVE
Nitrite, UA: NEGATIVE
Protein, UA: NEGATIVE
Spec Grav, UA: 1.01 (ref 1.010–1.025)
Urobilinogen, UA: 0.2 E.U./dL
pH, UA: 6 (ref 5.0–8.0)

## 2017-03-07 LAB — TSH: TSH: 2.84 mIU/L (ref 0.40–4.50)

## 2017-03-07 LAB — POCT GLYCOSYLATED HEMOGLOBIN (HGB A1C): Hemoglobin A1C: 5.9

## 2017-03-07 NOTE — Progress Notes (Signed)
Deanna Williams  MRN: [id number] DOB: Aug 29, 1928  Subjective:  HPI  Patient is here for 6 months follow. Last office visit was on 08/31/16. Hyperglycemia: patient does not check her sugar at home. Patient does not follow a certain diet regimen. Lab Results  Component Value Date   HGBA1C 6.0 08/31/2016   Wt Readings from Last 3 Encounters:  03/07/17 163 lb 12.8 oz (74.3 kg)  08/31/16 159 lb (72.1 kg)  07/19/16 159 lb (72.1 kg)   Osteoporosis: patient is on Alendronate once a week. Started in March 2017. Last BMD was at that time also and that is why patient was started on this medication.  AR: patient uses Flonase and takes Claritin and symptoms are controlled on this regimen usually. Patient would like to have her urine checked. She has had urinary burning, urgency and frequency for a few weeks now. No nausea or vomiting, no fever or chills. She has chronic back pain. Last lab work: CBC. MetC, TSH 05/26/16 Lipid 07/30/15 Patient Active Problem List   Diagnosis Date Noted  . Hyperglycemia 08/31/2016  . Endometrial polyp 11/11/2015  . Lichen sclerosus 28/31/5176  . Osteoporosis 08/11/2015  . Allergic rhinitis 04/14/2015  . Blood pressure elevated 04/14/2015  . 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 Acid reflux 04/14/2015  . 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 Hypercholesterolemia 04/14/2015  . BP (high blood pressure) 04/14/2015  . Adiposity 04/14/2015  . Arthritis, degenerative 04/14/2015  . Osteopenia 04/14/2015  . Awareness of heartbeats 04/14/2015    Past Medical History:  Diagnosis Date  . Gastroesophageal reflux disease   . Hyperlipidemia   . Hypertension   . Lichen sclerosus   . Lichen sclerosus et atrophicus   . Osteopenia   . Palpitations     Social History   Social History  . Marital status: Married    Spouse name: N/A  . Number of children: N/A  . Years of education: N/A   Occupational History  . Not on file.   Social History Main Topics  . Smoking  status: Never Smoker  . Smokeless tobacco: Never Used  . Alcohol use 0.0 oz/week     Comment: rare- wine  . Drug use: No  . Sexual activity: No   Other Topics Concern  . Not on file   Social History Narrative  . No narrative on file    Outpatient Encounter Prescriptions as of 03/07/2017  Medication Sig  . alendronate (FOSAMAX) 70 MG tablet TAKE 1 TABLET BY MOUTH ONCE WEEKLY WITH A FULL GLASS OF WATER ON AN EMPTY STOMACH  . fluticasone (FLONASE) 50 MCG/ACT nasal spray Place 2 sprays into both nostrils daily.  Marland Kitchen loratadine (CLARITIN) 10 MG tablet Take 1 tablet (10 mg total) by mouth daily.   No facility-administered encounter medications on file as of 03/07/2017.     No Known Allergies  Review of Systems  Constitutional: Negative.   Eyes: Negative.   Respiratory: Negative.   Cardiovascular: Negative.   Gastrointestinal: Negative.   Genitourinary: Positive for dysuria, frequency and urgency.  Musculoskeletal: Positive for back pain.  Skin: Negative.   Neurological: Negative.   Endo/Heme/Allergies: Negative.   Psychiatric/Behavioral: Negative.     Objective:  BP 114/66   Pulse 84   Temp (!) 97.5 F (36.4 C)   Resp 14   Wt 163 lb 12.8 oz (74.3 kg)   BMI 29.02 kg/m   Physical Exam  Constitutional: She is oriented to person, place, and time and well-developed, well-nourished, and in no distress.  HENT:  Head: Normocephalic and atraumatic.  Eyes: Pupils are equal, round, and reactive to light. Conjunctivae are normal.  Cardiovascular: Normal rate, regular rhythm, normal heart sounds and intact distal pulses.  Exam reveals no gallop.   No murmur heard. Pulmonary/Chest: Effort normal and breath sounds normal. No respiratory distress. She has no wheezes.  Neurological: She is alert and oriented to person, place, and time.  Skin: Skin is warm and dry.  Psychiatric: Mood, memory, affect and judgment normal.   Assessment and Plan :  1. Hyperglycemia 5.9 today. Stable.  Do not need to check this but once a year with routine lab work. - CBC with Differential/Platelet - Comprehensive metabolic panel - TSH  2. Seasonal allergic rhinitis due to pollen Stable on current regimen. - CBC with Differential/Platelet - Comprehensive metabolic panel - TSH  3. Age-related osteoporosis without current pathological fracture - CBC with Differential/Platelet - Comprehensive metabolic panel - TSH  4. Need for immunization against influenza - Flu vaccine HIGH DOSE PF (Fluzone High dose)  5. Burning with urination UA is normal in the office. Will send for culture to make sure. Pending results. - POCT urinalysis dipstick - Urine Culture  HPI, Exam and A&P transcribed by Tiffany Kocher, RMA under direction and in the presence of Miguel Aschoff, MD. I have done the exam and reviewed the chart and it is accurate to the best of my knowledge. Development worker, community has been used and  any errors in dictation or transcription are unintentional. Miguel Aschoff M.D. [facility]

## 2017-03-09 LAB — URINE CULTURE
MICRO NUMBER:: 81185416
Result:: NO GROWTH
SPECIMEN QUALITY:: ADEQUATE

## 2017-03-20 DIAGNOSIS — H04203 Unspecified epiphora, bilateral lacrimal glands: Secondary | ICD-10-CM | POA: Diagnosis not present

## 2017-03-20 DIAGNOSIS — H2513 Age-related nuclear cataract, bilateral: Secondary | ICD-10-CM | POA: Diagnosis not present

## 2017-05-23 ENCOUNTER — Telehealth: Payer: Self-pay | Admitting: Family Medicine

## 2017-05-24 NOTE — Telephone Encounter (Signed)
Patient would like to keep her 6 month f/u and maybe schedule her wellness when she comes in for her appointment.

## 2017-05-24 NOTE — Telephone Encounter (Signed)
Thanks

## 2017-08-13 ENCOUNTER — Other Ambulatory Visit: Payer: Self-pay | Admitting: Family Medicine

## 2017-08-13 DIAGNOSIS — M810 Age-related osteoporosis without current pathological fracture: Secondary | ICD-10-CM

## 2017-09-05 ENCOUNTER — Ambulatory Visit (INDEPENDENT_AMBULATORY_CARE_PROVIDER_SITE_OTHER): Payer: PPO | Admitting: Family Medicine

## 2017-09-05 VITALS — BP 138/86 | HR 76 | Temp 98.2°F | Resp 16 | Wt 159.0 lb

## 2017-09-05 DIAGNOSIS — B3731 Acute candidiasis of vulva and vagina: Secondary | ICD-10-CM

## 2017-09-05 DIAGNOSIS — B373 Candidiasis of vulva and vagina: Secondary | ICD-10-CM

## 2017-09-05 DIAGNOSIS — L900 Lichen sclerosus et atrophicus: Secondary | ICD-10-CM | POA: Diagnosis not present

## 2017-09-05 DIAGNOSIS — R739 Hyperglycemia, unspecified: Secondary | ICD-10-CM

## 2017-09-05 LAB — POCT GLYCOSYLATED HEMOGLOBIN (HGB A1C): Hemoglobin A1C: 5.9

## 2017-09-05 MED ORDER — CLOBETASOL PROPIONATE 0.05 % EX OINT: 1.0000 "application " | TOPICAL_OINTMENT | Freq: Two times a day (BID) | CUTANEOUS | 0 refills | Status: DC

## 2017-09-05 MED ORDER — TERCONAZOLE 0.8 % VA CREA: 1.0000 | TOPICAL_CREAM | Freq: Every day | VAGINAL | 0 refills | Status: DC

## 2017-09-05 NOTE — Progress Notes (Signed)
Deanna Williams  MRN: [id number] DOB: 06/01/1928  Subjective:  HPI   The patient is an 84 year old female who presents for follow up of her hyperglycemia.  Her last visit was on 03/07/17.  Her A1C at that time 5.9.  She also had her other labs done in October and all were WNL. The patient has also had elevated BP in the past with the last few readings being well controlled.  She also c/o chronic vaginal itching/irritation. Has seen [facility] for this in past. No vaginal bleeding.No abdominal pain. No GI symptoms. BP Readings from Last 3 Encounters:  09/05/17 138/86  03/07/17 114/66  08/31/16 110/60    Patient Active Problem List   Diagnosis Date Noted  . Hyperglycemia 08/31/2016  . Endometrial polyp 11/11/2015  . Lichen sclerosus 35/36/1443  . Osteoporosis 08/11/2015  . Allergic rhinitis 04/14/2015  . Blood pressure elevated 04/14/2015  . 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 Acid reflux 04/14/2015  . 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 Hypercholesterolemia 04/14/2015  . BP (high blood pressure) 04/14/2015  . Adiposity 04/14/2015  . Arthritis, degenerative 04/14/2015  . Osteopenia 04/14/2015  . Awareness of heartbeats 04/14/2015    Past Medical History:  Diagnosis Date  . Gastroesophageal reflux disease   . Hyperlipidemia   . Hypertension   . Lichen sclerosus   . Lichen sclerosus et atrophicus   . Osteopenia   . Palpitations     Social History   Socioeconomic History  . Marital status: Married    Spouse name: Not on file  . Number of children: Not on file  . Years of education: Not on file  . Highest education level: Not on file  Occupational History  . Not on file  Social Needs  . Financial resource strain: Not on file  . Food insecurity:    Worry: Not on file    Inability: Not on file  . Transportation needs:    Medical: Not on file    Non-medical: Not on file  Tobacco Use  . Smoking status: Never Smoker  . Smokeless tobacco: Never Used  Substance and Sexual  Activity  . Alcohol use: Yes    Alcohol/week: 0.0 oz    Comment: rare- wine  . Drug use: No  . Sexual activity: Never  Lifestyle  . Physical activity:    Days per week: Not on file    Minutes per session: Not on file  . Stress: Not on file  Relationships  . Social connections:    Talks on phone: Not on file    Gets together: Not on file    Attends religious service: Not on file    Active member of club or organization: Not on file    Attends meetings of clubs or organizations: Not on file    Relationship status: Not on file  . Intimate partner violence:    Fear of current or ex partner: Not on file    Emotionally abused: Not on file    Physically abused: Not on file    Forced sexual activity: Not on file  Other Topics Concern  . Not on file  Social History Narrative  . Not on file    Outpatient Encounter Medications as of 09/05/2017  Medication Sig  . alendronate (FOSAMAX) 70 MG tablet TAKE 1 TABLET BY MOUTH ONCE WEEKLY WITH A FULL GLASS OF WATER ON AN EMPTY STOMACH  . fluticasone (FLONASE) 50 MCG/ACT nasal spray Place 2 sprays into both nostrils daily.  Marland Kitchen loratadine (  CLARITIN) 10 MG tablet Take 1 tablet (10 mg total) by mouth daily.   No facility-administered encounter medications on file as of 09/05/2017.     No Known Allergies  Review of Systems  Constitutional: Positive for malaise/fatigue. Negative for fever.  Eyes: Negative.   Respiratory: Positive for cough (allergies). Negative for shortness of breath and wheezing.   Cardiovascular: Negative for chest pain, palpitations, orthopnea, claudication and leg swelling.  Gastrointestinal: Negative.   Skin: Negative.   Neurological: Negative for headaches.  Endo/Heme/Allergies: Negative.   Psychiatric/Behavioral: Negative.     Objective:  BP 138/86 (BP Location: Right Arm, Patient Position: Sitting, Cuff Size: Normal)   Pulse 76   Temp 98.2 F (36.8 C) (Oral)   Resp 16   Wt 159 lb (72.1 kg)   BMI 28.17 kg/m    Physical Exam  Constitutional: She is oriented to person, place, and time and well-developed, well-nourished, and in no distress.  HENT:  Head: Normocephalic and atraumatic.  Eyes: Conjunctivae are normal. No scleral icterus.  Neck: No thyromegaly present.  Cardiovascular: Normal rate, regular rhythm and normal heart sounds.  Pulmonary/Chest: Effort normal and breath sounds normal.  Abdominal: Soft.  Genitourinary: Vagina normal, right adnexa normal and left adnexa normal. No vaginal discharge found.  Lymphadenopathy:    She has no cervical adenopathy.  Neurological: She is alert and oriented to person, place, and time. Gait normal. GCS score is 15.  Skin: Skin is warm and dry.  Psychiatric: Memory, affect and judgment normal.    Assessment and Plan :  Hyperglycemia - Plan: POCT glycosylated hemoglobin (Hb A1C) --5.9 today. Normal lab f/u. AR Osteoporosis  Time for f/u BMD. Yeast Vagintitis  On wet prep. Terazol 3 cream. Lichen Sclerosis Temovate as per Gyn  I have done the exam and reviewed the chart and it is accurate to the best of my knowledge. Development worker, community has been used and  any errors in dictation or transcription are unintentional. Miguel Aschoff M.D. [facility]

## 2017-09-28 ENCOUNTER — Telehealth: Payer: Self-pay | Admitting: Family Medicine

## 2017-09-28 NOTE — Telephone Encounter (Signed)
Left message for patient to return my call for an appt.

## 2017-10-10 ENCOUNTER — Ambulatory Visit (INDEPENDENT_AMBULATORY_CARE_PROVIDER_SITE_OTHER): Payer: PPO | Admitting: Family Medicine

## 2017-10-10 VITALS — BP 150/80 | HR 76 | Temp 98.6°F | Resp 16 | Wt 160.0 lb

## 2017-10-10 DIAGNOSIS — B373 Candidiasis of vulva and vagina: Secondary | ICD-10-CM

## 2017-10-10 DIAGNOSIS — M549 Dorsalgia, unspecified: Secondary | ICD-10-CM | POA: Diagnosis not present

## 2017-10-10 DIAGNOSIS — L900 Lichen sclerosus et atrophicus: Secondary | ICD-10-CM | POA: Diagnosis not present

## 2017-10-10 DIAGNOSIS — B3731 Acute candidiasis of vulva and vagina: Secondary | ICD-10-CM

## 2017-10-10 NOTE — Progress Notes (Signed)
Patient: Deanna Williams Female    DOB: 02-Aug-1928   84 y.o.   MRN: [id number] Visit Date: 10/10/2017  Today's Provider: [name], MD   Chief Complaint  Patient presents with  . Follow-up   Subjective:    HPI Pt is here today for follow up of yeast infection. She was treated with Terazol 3.  She reports that all her symptoms have completely resolved.     No Known Allergies   Current Outpatient Medications:  .  alendronate (FOSAMAX) 70 MG tablet, TAKE 1 TABLET BY MOUTH ONCE WEEKLY WITH A FULL GLASS OF WATER ON AN EMPTY STOMACH, Disp: 4 tablet, Rfl: 11 .  clobetasol ointment (TEMOVATE) 2.44 %, Apply 1 application topically 2 (two) times daily. (Patient not taking: Reported on 10/10/2017), Disp: 30 g, Rfl: 0 .  fluticasone (FLONASE) 50 MCG/ACT nasal spray, Place 2 sprays into both nostrils daily. (Patient not taking: Reported on 10/10/2017), Disp: 16 g, Rfl: 11 .  loratadine (CLARITIN) 10 MG tablet, Take 1 tablet (10 mg total) by mouth daily. (Patient not taking: Reported on 10/10/2017), Disp: 90 tablet, Rfl: 3 .  terconazole (TERAZOL 3) 0.8 % vaginal cream, Place 1 applicator vaginally at bedtime. (Patient not taking: Reported on 10/10/2017), Disp: 20 g, Rfl: 0  Review of Systems  Constitutional: Negative.   HENT: Negative.   Eyes: Negative.   Respiratory: Negative.   Cardiovascular: Negative.   Gastrointestinal: Negative.   Endocrine: Negative.   Genitourinary: Negative.   Musculoskeletal: Negative.   Skin: Negative.   Allergic/Immunologic: Negative.   Neurological: Negative.   Hematological: Negative.   Psychiatric/Behavioral: Negative.     Social History   Tobacco Use  . Smoking status: Never Smoker  . Smokeless tobacco: Never Used  Substance Use Topics  . Alcohol use: Yes    Alcohol/week: 0.0 oz    Comment: rare- wine   Objective:   BP (!) 150/80 (BP Location: Right Arm, Patient Position: Sitting, Cuff Size: Normal)   Pulse 76   Temp 98.6 F (37  C) (Oral)   Resp 16   Wt 160 lb (72.6 kg)   BMI 28.34 kg/m  Vitals:   10/10/17 1531  BP: (!) 150/80  Pulse: 76  Resp: 16  Temp: 98.6 F (37 C)  TempSrc: Oral  Weight: 160 lb (72.6 kg)     Physical Exam  Constitutional: She is oriented to person, place, and time. She appears well-developed and well-nourished.  HENT:  Head: Normocephalic and atraumatic.  Eyes: Conjunctivae are normal. No scleral icterus.  Neck: No thyromegaly present.  Cardiovascular: Normal rate, regular rhythm and normal heart sounds.  Pulmonary/Chest: Effort normal and breath sounds normal.  Abdominal: Soft.  Neurological: She is alert and oriented to person, place, and time.  Skin: Skin is warm and dry.  Psychiatric: She has a normal mood and affect. Her behavior is normal. Judgment and thought content normal.        Assessment & Plan:     1. Yeast vaginitis Resolved.  2. Lichen sclerosus May need Gyn f/u in future.  3. Back pain, unspecified back location, unspecified back pain laterality, unspecified chronicity Stable.      I have done the exam and reviewed the above chart and it is accurate to the best of my knowledge. Development worker, community has been used in this note in any air is in the dictation or transcription are unintentional.  [name], MD  [facility]

## 2017-10-10 NOTE — Patient Instructions (Signed)
Take Tylenol 650 mg daily before activity.

## 2017-10-11 NOTE — Telephone Encounter (Signed)
LMTCB and clarify if ok to move apt time from 8:40 on 10/18/17 to 8:30 on 10/18/17. Pt was scheduled incorrectly in a 20 minutes slot. Apt time has already been changed on schedule. -MM

## 2017-10-11 NOTE — Telephone Encounter (Signed)
Pt LM stating that she could be here for her AWV next Wednesday, 10/18/17, at 8:30 AM instead of 8:40 AM. -MM

## 2017-10-18 ENCOUNTER — Ambulatory Visit (INDEPENDENT_AMBULATORY_CARE_PROVIDER_SITE_OTHER): Payer: PPO

## 2017-10-18 VITALS — BP 128/80 | HR 73 | Temp 98.8°F | Ht 63.0 in | Wt 159.2 lb

## 2017-10-18 DIAGNOSIS — Z0000 Encounter for general adult medical examination without abnormal findings: Secondary | ICD-10-CM | POA: Diagnosis not present

## 2017-10-18 NOTE — Patient Instructions (Addendum)
Ms. Deanna Williams , Thank you for taking time to come for your Medicare Wellness Visit. I appreciate your ongoing commitment to your health goals. Please review the following plan we discussed and let me know if I can assist you in the future.   Screening recommendations/referrals: Colonoscopy: Up to date Mammogram: Up to date Bone Density: Up to date Recommended yearly ophthalmology/optometry visit for glaucoma screening and checkup Recommended yearly dental visit for hygiene and checkup  Vaccinations: Influenza vaccine: Up to date Pneumococcal vaccine: Up to date Tdap vaccine: Up to date Shingles vaccine: Pt declines today.     Advanced directives: Already on file.  Conditions/risks identified: Recommend increasing water intake 3 glasses of water a day.   Next appointment: 10/19/17 @ 9:20 AM with Dr Rosanna Randy. Pt declined scheduling the AWV for 2020.   Preventive Care 74 Years and Older, Female Preventive care refers to lifestyle choices and visits with your health care provider that can promote health and wellness. What does preventive care include?  A yearly physical exam. This is also called an annual well check.  Dental exams once or twice a year.  Routine eye exams. Ask your health care provider how often you should have your eyes checked.  Personal lifestyle choices, including:  Daily care of your teeth and gums.  Regular physical activity.  Eating a healthy diet.  Avoiding tobacco and drug use.  Limiting alcohol use.  Practicing safe sex.  Taking low-dose aspirin every day.  Taking vitamin and mineral supplements as recommended by your health care provider. What happens during an annual well check? The services and screenings done by your health care provider during your annual well check will depend on your age, overall health, lifestyle risk factors, and family history of disease. Counseling  Your health care provider may ask you questions about your:  Alcohol  use.  Tobacco use.  Drug use.  Emotional well-being.  Home and relationship well-being.  Sexual activity.  Eating habits.  History of falls.  Memory and ability to understand (cognition).  Work and work Statistician.  Reproductive health. Screening  You may have the following tests or measurements:  Height, weight, and BMI.  Blood pressure.  Lipid and cholesterol levels. These may be checked every 5 years, or more frequently if you are over 63 years old.  Skin check.  Lung cancer screening. You may have this screening every year starting at age 55 if you have a 30-pack-year history of smoking and currently smoke or have quit within the past 15 years.  Fecal occult blood test (FOBT) of the stool. You may have this test every year starting at age 63.  Flexible sigmoidoscopy or colonoscopy. You may have a sigmoidoscopy every 5 years or a colonoscopy every 10 years starting at age 44.  Hepatitis C blood test.  Hepatitis B blood test.  Sexually transmitted disease (STD) testing.  Diabetes screening. This is done by checking your blood sugar (glucose) after you have not eaten for a while (fasting). You may have this done every 1-3 years.  Bone density scan. This is done to screen for osteoporosis. You may have this done starting at age 73.  Mammogram. This may be done every 1-2 years. Talk to your health care provider about how often you should have regular mammograms. Talk with your health care provider about your test results, treatment options, and if necessary, the need for more tests. Vaccines  Your health care provider may recommend certain vaccines, such as:  Influenza vaccine.  This is recommended every year.  Tetanus, diphtheria, and acellular pertussis (Tdap, Td) vaccine. You may need a Td booster every 10 years.  Zoster vaccine. You may need this after age 67.  Pneumococcal 13-valent conjugate (PCV13) vaccine. One dose is recommended after age  71.  Pneumococcal polysaccharide (PPSV23) vaccine. One dose is recommended after age 86. Talk to your health care provider about which screenings and vaccines you need and how often you need them. This information is not intended to replace advice given to you by your health care provider. Make sure you discuss any questions you have with your health care provider. Document Released: 05/29/2015 Document Revised: 01/20/2016 Document Reviewed: 03/03/2015 Elsevier Interactive Patient Education  2017 [place] Prevention in the Home Falls can cause injuries. They can happen to people of all ages. There are many things you can do to make your home safe and to help prevent falls. What can I do on the outside of my home?  Regularly fix the edges of walkways and driveways and fix any cracks.  Remove anything that might make you trip as you walk through a door, such as a raised step or threshold.  Trim any bushes or trees on the path to your home.  Use bright outdoor lighting.  Clear any walking paths of anything that might make someone trip, such as rocks or tools.  Regularly check to see if handrails are loose or broken. Make sure that both sides of any steps have handrails.  Any raised decks and porches should have guardrails on the edges.  Have any leaves, snow, or ice cleared regularly.  Use sand or salt on walking paths during winter.  Clean up any spills in your garage right away. This includes oil or grease spills. What can I do in the bathroom?  Use night lights.  Install grab bars by the toilet and in the tub and shower. Do not use towel bars as grab bars.  Use non-skid mats or decals in the tub or shower.  If you need to sit down in the shower, use a plastic, non-slip stool.  Keep the floor dry. Clean up any water that spills on the floor as soon as it happens.  Remove soap buildup in the tub or shower regularly.  Attach bath mats securely with double-sided  non-slip rug tape.  Do not have throw rugs and other things on the floor that can make you trip. What can I do in the bedroom?  Use night lights.  Make sure that you have a light by your bed that is easy to reach.  Do not use any sheets or blankets that are too big for your bed. They should not hang down onto the floor.  Have a firm chair that has side arms. You can use this for support while you get dressed.  Do not have throw rugs and other things on the floor that can make you trip. What can I do in the kitchen?  Clean up any spills right away.  Avoid walking on wet floors.  Keep items that you use a lot in easy-to-reach places.  If you need to reach something above you, use a strong step stool that has a grab bar.  Keep electrical cords out of the way.  Do not use floor polish or wax that makes floors slippery. If you must use wax, use non-skid floor wax.  Do not have throw rugs and other things on the floor that can make  you trip. What can I do with my stairs?  Do not leave any items on the stairs.  Make sure that there are handrails on both sides of the stairs and use them. Fix handrails that are broken or loose. Make sure that handrails are as long as the stairways.  Check any carpeting to make sure that it is firmly attached to the stairs. Fix any carpet that is loose or worn.  Avoid having throw rugs at the top or bottom of the stairs. If you do have throw rugs, attach them to the floor with carpet tape.  Make sure that you have a light switch at the top of the stairs and the bottom of the stairs. If you do not have them, ask someone to add them for you. What else can I do to help prevent falls?  Wear shoes that:  Do not have high heels.  Have rubber bottoms.  Are comfortable and fit you well.  Are closed at the toe. Do not wear sandals.  If you use a stepladder:  Make sure that it is fully opened. Do not climb a closed stepladder.  Make sure that both  sides of the stepladder are locked into place.  Ask someone to hold it for you, if possible.  Clearly mark and make sure that you can see:  Any grab bars or handrails.  First and last steps.  Where the edge of each step is.  Use tools that help you move around (mobility aids) if they are needed. These include:  Canes.  Walkers.  Scooters.  Crutches.  Turn on the lights when you go into a dark area. Replace any light bulbs as soon as they burn out.  Set up your furniture so you have a clear path. Avoid moving your furniture around.  If any of your floors are uneven, fix them.  If there are any pets around you, be aware of where they are.  Review your medicines with your doctor. Some medicines can make you feel dizzy. This can increase your chance of falling. Ask your doctor what other things that you can do to help prevent falls. This information is not intended to replace advice given to you by your health care provider. Make sure you discuss any questions you have with your health care provider. Document Released: 02/26/2009 Document Revised: 10/08/2015 Document Reviewed: 06/06/2014 Elsevier Interactive Patient Education  2017 [employer].

## 2017-10-18 NOTE — Progress Notes (Signed)
Subjective:   Deanna Williams is a 84 y.o. female who presents for Medicare Annual (Subsequent) preventive examination.  Review of Systems:  N/A  Cardiac Risk Factors include: advanced age (>29men, >1 women);dyslipidemia;hypertension     Objective:     Vitals: BP 128/80 (BP Location: Right Arm)   Pulse 73   Temp 98.8 F (37.1 C) (Oral)   Ht 5\' 3"  (1.6 m)   Wt 159 lb 3.2 oz (72.2 kg)   BMI 28.20 kg/m   Body mass index is 28.2 kg/m.  Advanced Directives 10/18/2017 05/26/2016 11/02/2015 10/27/2015 04/21/2015 11/03/2014  Does Patient Have a Medical Advance Directive? Yes Yes Yes Yes No Yes  Type of Paramedic of [place];Living will [place];Living will [place];Living will [place]  Does patient want to make changes to medical advance directive? - - No - Patient declined - - No - Patient declined  Copy of [place] in Chart? Yes No - copy requested Yes Yes - No - copy requested    Tobacco Social History   Tobacco Use  Smoking Status Never Smoker  Smokeless Tobacco Never Used     Counseling given: Not Answered   Clinical Intake:  Pre-visit preparation completed: Yes  Pain : No/denies pain Pain Score: 0-No pain     Nutritional Status: BMI 25 -29 Overweight Nutritional Risks: None Diabetes: No  How often do you need to have someone help you when you read instructions, pamphlets, or other written materials from your doctor or pharmacy?: 1 - Never  Interpreter Needed?: No  Information entered by :: [facility], LPN  Past Medical History:  Diagnosis Date  . Gastroesophageal reflux disease   . Hyperlipidemia   . Hypertension   . Lichen sclerosus   . Lichen sclerosus et atrophicus   . Osteopenia   . Palpitations    Past Surgical History:  Procedure Laterality Date  . CARPAL TUNNEL RELEASE    . FOOT SURGERY    .  HYSTEROSCOPY W/D&C N/A 11/02/2015   Procedure: DILATATION AND CURETTAGE /HYSTEROSCOPY;  Surgeon: Brayton Mars, MD;  Location: [facility] ORS;  Service: Gynecology;  Laterality: N/A;   Family History  Problem Relation Age of Onset  . Cancer Mother   . Leukemia Mother   . Cancer Father        colon  . Hypertension Father   . Arthritis Sister   . Breast cancer Neg Hx    Social History   Socioeconomic History  . Marital status: Widowed    Spouse name: Not on file  . Number of children: 1  . Years of education: Not on file  . Highest education level: 11th grade  Occupational History  . Not on file  Social Needs  . Financial resource strain: Not hard at all  . Food insecurity:    Worry: Never true    Inability: Never true  . Transportation needs:    Medical: No    Non-medical: No  Tobacco Use  . Smoking status: Never Smoker  . Smokeless tobacco: Never Used  Substance and Sexual Activity  . Alcohol use: Yes    Alcohol/week: 0.0 oz    Comment: rare- wine  . Drug use: No  . Sexual activity: Never  Lifestyle  . Physical activity:    Days per week: Not on file    Minutes per session: Not on file  . Stress: Only a  little  Relationships  . Social connections:    Talks on phone: Not on file    Gets together: Not on file    Attends religious service: Not on file    Active member of club or organization: Not on file    Attends meetings of clubs or organizations: Not on file    Relationship status: Not on file  Other Topics Concern  . Not on file  Social History Narrative  . Not on file    Outpatient Encounter Medications as of 10/18/2017  Medication Sig  . alendronate (FOSAMAX) 70 MG tablet TAKE 1 TABLET BY MOUTH ONCE WEEKLY WITH A FULL GLASS OF WATER ON AN EMPTY STOMACH  . clobetasol ointment (TEMOVATE) 3.15 % Apply 1 application topically 2 (two) times daily. (Patient taking differently: Apply 1 application topically as needed. )  . fluticasone (FLONASE) 50 MCG/ACT  nasal spray Place 2 sprays into both nostrils daily. (Patient not taking: Reported on 10/10/2017)  . loratadine (CLARITIN) 10 MG tablet Take 1 tablet (10 mg total) by mouth daily. (Patient not taking: Reported on 10/10/2017)  . terconazole (TERAZOL 3) 0.8 % vaginal cream Place 1 applicator vaginally at bedtime. (Patient not taking: Reported on 10/10/2017)   No facility-administered encounter medications on file as of 10/18/2017.     Activities of Daily Living In your present state of health, do you have any difficulty performing the following activities: 10/18/2017 09/05/2017  Hearing? Y N  Comment Hearing has got worse- not intersted in hearing aids. -  Vision? N N  Difficulty concentrating or making decisions? Deanna Williams  Walking or climbing stairs? Y N  Comment Due to SOB - avoids steps. -  Dressing or bathing? N N  Doing errands, shopping? N N  Preparing Food and eating ? N -  Using the Toilet? N -  In the past six months, have you accidently leaked urine? Y -  Comment Wears protection.  -  Do you have problems with loss of bowel control? N -  Managing your Medications? N -  Managing your Finances? N -  Housekeeping or managing your Housekeeping? N -  Some recent data might be hidden    Patient Care Team: Jerrol Banana., MD as PCP - General (Family Medicine) Dingeldein, Remo Lipps, MD as Consulting Physician (Ophthalmology) Ralene Bathe, MD as Consulting Physician (Dermatology)    Assessment:   This is a routine wellness examination for Deanna Williams.  Exercise Activities and Dietary recommendations Current Exercise Habits: The patient does not participate in regular exercise at present(Walks dog daily.), Exercise limited by: None identified  Goals    . DIET - INCREASE WATER INTAKE     Recommend increasing water intake 3 glasses of water a day.        Fall Risk Fall Risk  10/18/2017 09/05/2017 05/26/2016 04/21/2015  Falls in the past year? No No No No   Is the patient's home free  of loose throw rugs in walkways, pet beds, electrical cords, etc?   yes      Grab bars in the bathroom? yes      Handrails on the stairs?   no      Adequate lighting?   yes  Timed Get Up and Go performed: N/A  Depression Screen PHQ 2/9 Scores 10/18/2017 09/05/2017 05/26/2016 04/21/2015  PHQ - 2 Score 0 2 1 0  PHQ- 9 Score 5 5 - -     Cognitive Function: Pt declined screening today.  6CIT Screen 05/26/2016  What Year? 0 points  What month? 0 points  What time? 0 points  Count back from 20 0 points  Months in reverse 0 points  Repeat phrase 0 points  Total Score 0    Immunization History  Administered Date(s) Administered  . Influenza, High Dose Seasonal PF 02/11/2016, 03/07/2017  . Influenza-Unspecified 02/13/2014  . Pneumococcal Conjugate-13 03/24/2014  . Pneumococcal Polysaccharide-23 03/04/1998  . Td 03/06/2008  . Zoster 07/30/2007    Qualifies for Shingles Vaccine? Due for Shingles vaccine. Declined my offer to administer today. Education has been provided regarding the importance of this vaccine. Pt has been advised to call her insurance company to determine her out of pocket expense. Advised she may also receive this vaccine at her local pharmacy or Health Dept. Verbalized acceptance and understanding.  Screening Tests Health Maintenance  Topic Date Due  . INFLUENZA VACCINE  12/14/2017  . TETANUS/TDAP  03/06/2018  . DEXA SCAN  Completed  . PNA vac Low Risk Adult  Completed    Cancer Screenings: Lung: Low Dose CT Chest recommended if Age 23-80 years, 30 pack-year currently smoking OR have quit w/in 15years. Patient does not qualify. Breast:  Up to date on Mammogram? Yes   Up to date of Bone Density/Dexa? Yes Colorectal: Up to date  Additional Screenings:  Hepatitis C Screening: N/A     Plan:  I have personally reviewed and addressed the Medicare Annual Wellness questionnaire and have noted the following in the patient's chart:  A. Medical and social  history B. Use of alcohol, tobacco or illicit drugs  C. Current medications and supplements D. Functional ability and status E.  Nutritional status F.  Physical activity G. Advance directives H. List of other physicians I.  Hospitalizations, surgeries, and ER visits in previous 12 months J.  [place] such as hearing and vision if needed, cognitive and depression L. Referrals and appointments - none  In addition, I have reviewed and discussed with patient certain preventive protocols, quality metrics, and best practice recommendations. A written personalized care plan for preventive services as well as general preventive health recommendations were provided to patient.  See attached scanned questionnaire for additional information.   Signed,  Fabio Neighbors, LPN Nurse Health Advisor   Nurse Recommendations:

## 2017-10-19 ENCOUNTER — Encounter: Payer: Self-pay | Admitting: Family Medicine

## 2017-10-19 ENCOUNTER — Ambulatory Visit (INDEPENDENT_AMBULATORY_CARE_PROVIDER_SITE_OTHER): Payer: PPO | Admitting: Family Medicine

## 2017-10-19 VITALS — BP 108/62 | Temp 97.8°F | Resp 16 | Wt 159.0 lb

## 2017-10-19 DIAGNOSIS — Z0000 Encounter for general adult medical examination without abnormal findings: Secondary | ICD-10-CM | POA: Diagnosis not present

## 2017-10-19 NOTE — Progress Notes (Signed)
Patient: Deanna Williams, Female    DOB: [birth year], 84 y.o.   MRN: [id number] Visit Date: 10/19/2017  Today's Provider: [name], MD   Chief Complaint  Patient presents with  . Annual Wellness Visit   Subjective:  Patient saw Alyson Ingles on 10/18/2017 for AWE.   Annual wellness visit Deanna Williams is a 84 y.o. female. She feels well. She reports exercising occasionally. She reports she is sleeping fairly well.  Mammogram- 05/07/2015. Normal.  BMD- 08/11/2015. Osteoporosis. Started on alendronate.     Review of Systems  Constitutional: Negative.   HENT: Negative.   Eyes: Negative.   Respiratory: Negative.   Cardiovascular: Negative.   Gastrointestinal: Negative.   Endocrine: Negative.   Genitourinary: Negative.   Musculoskeletal: Positive for arthralgias and back pain.  Skin: Negative.   Allergic/Immunologic: Negative.   Neurological: Negative.   Hematological: Negative.   Psychiatric/Behavioral: Negative.     Social History   Socioeconomic History  . Marital status: Widowed    Spouse name: Not on file  . Number of children: 1  . Years of education: Not on file  . Highest education level: 11th grade  Occupational History  . Not on file  Social Needs  . Financial resource strain: Not hard at all  . Food insecurity:    Worry: Never true    Inability: Never true  . Transportation needs:    Medical: No    Non-medical: No  Tobacco Use  . Smoking status: Never Smoker  . Smokeless tobacco: Never Used  Substance and Sexual Activity  . Alcohol use: Yes    Alcohol/week: 0.0 oz    Comment: rare- wine  . Drug use: No  . Sexual activity: Never  Lifestyle  . Physical activity:    Days per week: Not on file    Minutes per session: Not on file  . Stress: Only a little  Relationships  . Social connections:    Talks on phone: Not on file    Gets together: Not on file    Attends religious service: Not on file    Active member of club or  organization: Not on file    Attends meetings of clubs or organizations: Not on file    Relationship status: Not on file  . Intimate partner violence:    Fear of current or ex partner: Not on file    Emotionally abused: Not on file    Physically abused: Not on file    Forced sexual activity: Not on file  Other Topics Concern  . Not on file  Social History Narrative  . Not on file    Past Medical History:  Diagnosis Date  . Gastroesophageal reflux disease   . Hyperlipidemia   . Hypertension   . Lichen sclerosus   . Lichen sclerosus et atrophicus   . Osteopenia   . Palpitations      Patient Active Problem List   Diagnosis Date Noted  . Hyperglycemia 08/31/2016  . Endometrial polyp 11/11/2015  . Lichen sclerosus 80/99/8338  . Osteoporosis 08/11/2015  . Allergic rhinitis 04/14/2015  . Blood pressure elevated 04/14/2015  . 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 Acid reflux 04/14/2015  . 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 Hypercholesterolemia 04/14/2015  . BP (high blood pressure) 04/14/2015  . Adiposity 04/14/2015  . Arthritis, degenerative 04/14/2015  . Osteopenia 04/14/2015  . Awareness of heartbeats 04/14/2015    Past Surgical History:  Procedure Laterality Date  . CARPAL TUNNEL RELEASE    .  FOOT SURGERY    . HYSTEROSCOPY W/D&C N/A 11/02/2015   Procedure: DILATATION AND CURETTAGE /HYSTEROSCOPY;  Surgeon: Brayton Mars, MD;  Location: [facility] ORS;  Service: Gynecology;  Laterality: N/A;    Her family history includes Arthritis in her sister; Cancer in her father and mother; Hypertension in her father; Leukemia in her mother. There is no history of Breast cancer.      Current Outpatient Medications:  .  alendronate (FOSAMAX) 70 MG tablet, TAKE 1 TABLET BY MOUTH ONCE WEEKLY WITH A FULL GLASS OF WATER ON AN EMPTY STOMACH, Disp: 4 tablet, Rfl: 11 .  clobetasol ointment (TEMOVATE) 8.84 %, Apply 1 application topically 2 (two) times daily. (Patient taking differently: Apply 1  application topically as needed. ), Disp: 30 g, Rfl: 0 .  fluticasone (FLONASE) 50 MCG/ACT nasal spray, Place 2 sprays into both nostrils daily. (Patient not taking: Reported on 10/10/2017), Disp: 16 g, Rfl: 11 .  loratadine (CLARITIN) 10 MG tablet, Take 1 tablet (10 mg total) by mouth daily. (Patient not taking: Reported on 10/10/2017), Disp: 90 tablet, Rfl: 3 .  terconazole (TERAZOL 3) 0.8 % vaginal cream, Place 1 applicator vaginally at bedtime. (Patient not taking: Reported on 10/10/2017), Disp: 20 g, Rfl: 0  Patient Care Team: Jerrol Banana., MD as PCP - General (Family Medicine) Dingeldein, Remo Lipps, MD as Consulting Physician (Ophthalmology) Ralene Bathe, MD as Consulting Physician (Dermatology)     Objective:   Vitals: BP 108/62 (BP Location: Right Arm, Patient Position: Sitting, Cuff Size: Normal)   Temp 97.8 F (36.6 C)   Resp 16   Wt 159 lb (72.1 kg)   BMI 28.17 kg/m   Physical Exam  Constitutional: She is oriented to person, place, and time. She appears well-developed and well-nourished.  HENT:  Head: Normocephalic and atraumatic.  Right Ear: External ear normal.  Left Ear: External ear normal.  Nose: Nose normal.  Mouth/Throat: Oropharynx is clear and moist.  Eyes: Pupils are equal, round, and reactive to light. Conjunctivae are normal. No scleral icterus.  Neck: No thyromegaly present.  Cardiovascular: Normal rate, regular rhythm, normal heart sounds and intact distal pulses.  Pulmonary/Chest: Effort normal and breath sounds normal.  Abdominal: Soft.  Musculoskeletal: She exhibits no edema.  Lymphadenopathy:    She has no cervical adenopathy.  Neurological: She is alert and oriented to person, place, and time.  Skin: Skin is warm and dry.  Psychiatric: She has a normal mood and affect. Her behavior is normal. Thought content normal.    Activities of Daily Living In your present state of health, do you have any difficulty performing the following  activities: 10/18/2017 09/05/2017  Hearing? Y N  Comment Hearing has got worse- not intersted in hearing aids. -  Vision? N N  Difficulty concentrating or making decisions? Tempie Donning  Walking or climbing stairs? Y N  Comment Due to SOB - avoids steps. -  Dressing or bathing? N N  Doing errands, shopping? N N  Preparing Food and eating ? N -  Using the Toilet? N -  In the past six months, have you accidently leaked urine? Y -  Comment Wears protection.  -  Do you have problems with loss of bowel control? N -  Managing your Medications? N -  Managing your Finances? N -  Housekeeping or managing your Housekeeping? N -  Some recent data might be hidden    Fall Risk Assessment Fall Risk  10/18/2017 09/05/2017 05/26/2016 04/21/2015  Falls in  the past year? No No No No     Depression Screen PHQ 2/9 Scores 10/18/2017 09/05/2017 05/26/2016 04/21/2015  PHQ - 2 Score 0 2 1 0  PHQ- 9 Score 5 5 - -    Cognitive Testing - 6-CIT Pt. Declined screening on 10/18/2017. 6CIT Screen 05/26/2016  What Year? 0 points  What month? 0 points  What time? 0 points  Count back from 20 0 points  Months in reverse 0 points  Repeat phrase 0 points  Total Score 0        Assessment & Plan:     Annual Wellness Visit  Reviewed patient's Family Medical History Reviewed and updated list of patient's medical providers Assessment of cognitive impairment was done Assessed patient's functional ability Established a written schedule for health screening [place] Completed and Reviewed  Exercise Activities and Dietary recommendations Goals    . DIET - INCREASE WATER INTAKE     Recommend increasing water intake 3 glasses of water a day.        Immunization History  Administered Date(s) Administered  . Influenza, High Dose Seasonal PF 02/11/2016, 03/07/2017  . Influenza-Unspecified 02/13/2014  . Pneumococcal Conjugate-13 03/24/2014  . Pneumococcal Polysaccharide-23 03/04/1998  . Td  03/06/2008  . Zoster 07/30/2007  . Zoster Recombinat (Shingrix) 01/06/2017    Health Maintenance  Topic Date Due  . INFLUENZA VACCINE  12/14/2017  . TETANUS/TDAP  03/06/2018  . DEXA SCAN  Completed  . PNA vac Low Risk Adult  Completed     Discussed health benefits of physical activity, and encouraged her to engage in regular exercise appropriate for her age and condition. Pt declines any further screening tests--"I will not do any surgeries." Osteoporosis Endometrial Polyp Followed by Dr Enzo Bi     [name], MD  [place]

## 2018-03-01 DIAGNOSIS — L578 Other skin changes due to chronic exposure to nonionizing radiation: Secondary | ICD-10-CM | POA: Diagnosis not present

## 2018-03-01 DIAGNOSIS — D1800 Hemangioma unspecified site: Secondary | ICD-10-CM | POA: Diagnosis not present

## 2018-03-01 DIAGNOSIS — Z1283 Encounter for screening for malignant neoplasm of skin: Secondary | ICD-10-CM | POA: Diagnosis not present

## 2018-03-01 DIAGNOSIS — L821 Other seborrheic keratosis: Secondary | ICD-10-CM | POA: Diagnosis not present

## 2018-03-01 DIAGNOSIS — L820 Inflamed seborrheic keratosis: Secondary | ICD-10-CM | POA: Diagnosis not present

## 2018-03-01 DIAGNOSIS — L570 Actinic keratosis: Secondary | ICD-10-CM | POA: Diagnosis not present

## 2018-04-30 ENCOUNTER — Ambulatory Visit: Payer: Self-pay | Admitting: Family Medicine

## 2018-05-02 ENCOUNTER — Encounter: Payer: Self-pay | Admitting: Family Medicine

## 2018-05-02 ENCOUNTER — Ambulatory Visit (INDEPENDENT_AMBULATORY_CARE_PROVIDER_SITE_OTHER): Payer: PPO | Admitting: Family Medicine

## 2018-05-02 VITALS — BP 132/80 | HR 64 | Temp 98.4°F | Resp 16 | Ht 63.0 in | Wt 158.0 lb

## 2018-05-02 DIAGNOSIS — R7303 Prediabetes: Secondary | ICD-10-CM | POA: Diagnosis not present

## 2018-05-02 DIAGNOSIS — L900 Lichen sclerosus et atrophicus: Secondary | ICD-10-CM

## 2018-05-02 DIAGNOSIS — M810 Age-related osteoporosis without current pathological fracture: Secondary | ICD-10-CM | POA: Diagnosis not present

## 2018-05-02 LAB — POCT GLYCOSYLATED HEMOGLOBIN (HGB A1C): Hemoglobin A1C: 6.1 % — AB (ref 4.0–5.6)

## 2018-05-02 MED ORDER — CLOBETASOL PROPIONATE 0.05 % EX OINT: 1.0000 "application " | TOPICAL_OINTMENT | Freq: Two times a day (BID) | CUTANEOUS | 0 refills | Status: DC

## 2018-05-02 NOTE — Progress Notes (Signed)
Patient: Deanna Williams Female    DOB: 07/23/1928   84 y.o.   MRN: [id number] Visit Date: 05/02/2018  Today's Provider: [name], MD   Chief Complaint  Patient presents with  . Follow-up   Subjective:     HPI  Patient comes in today for a 6 month follow up. She feels well today with minor complaints. No medications were changed during last visit. She is due for a hgbA1c check today. She is also requesting a refill on cream for itching.  Lab Results  Component Value Date   HGBA1C 6.1 (A) 05/02/2018     No Known Allergies   Current Outpatient Medications:  .  alendronate (FOSAMAX) 70 MG tablet, TAKE 1 TABLET BY MOUTH ONCE WEEKLY WITH A FULL GLASS OF WATER ON AN EMPTY STOMACH, Disp: 4 tablet, Rfl: 11 .  clobetasol ointment (TEMOVATE) 2.87 %, Apply 1 application topically 2 (two) times daily. (Patient taking differently: Apply 1 application topically as needed. ), Disp: 30 g, Rfl: 0 .  terconazole (TERAZOL 3) 0.8 % vaginal cream, Place 1 applicator vaginally at bedtime., Disp: 20 g, Rfl: 0 .  fluticasone (FLONASE) 50 MCG/ACT nasal spray, Place 2 sprays into both nostrils daily. (Patient not taking: Reported on 10/10/2017), Disp: 16 g, Rfl: 11 .  loratadine (CLARITIN) 10 MG tablet, Take 1 tablet (10 mg total) by mouth daily. (Patient not taking: Reported on 10/10/2017), Disp: 90 tablet, Rfl: 3  Review of Systems  Constitutional: Negative.   HENT: Negative.   Eyes: Negative.   Respiratory: Negative for cough and shortness of breath.   Gastrointestinal: Negative.   Endocrine: Negative for cold intolerance, heat intolerance, polydipsia and polyuria.  Musculoskeletal: Negative for arthralgias and joint swelling.  Skin: Negative for color change, pallor, rash and wound.  Allergic/Immunologic: Negative.   Neurological: Negative for dizziness and headaches.  Psychiatric/Behavioral: Negative.     Social History   Tobacco Use  . Smoking status: Never Smoker  .  Smokeless tobacco: Never Used  Substance Use Topics  . Alcohol use: Yes    Alcohol/week: 0.0 standard drinks    Comment: rare- wine      Objective:   BP 132/80 (BP Location: Left Arm, Patient Position: Sitting, Cuff Size: Normal)   Pulse 64   Temp 98.4 F (36.9 C)   Resp 16   Ht 5\' 3"  (1.6 m)   Wt 158 lb (71.7 kg)   SpO2 96%   BMI 27.99 kg/m  Vitals:   05/02/18 1547  BP: 132/80  Pulse: 64  Resp: 16  Temp: 98.4 F (36.9 C)  SpO2: 96%  Weight: 158 lb (71.7 kg)  Height: 5\' 3"  (1.6 m)     Physical Exam Constitutional:      Appearance: She is well-developed.  HENT:     Head: Normocephalic and atraumatic.     Right Ear: External ear normal.     Left Ear: External ear normal.     Nose: Nose normal.  Eyes:     Conjunctiva/sclera: Conjunctivae normal.  Neck:     Musculoskeletal: Normal range of motion.  Cardiovascular:     Rate and Rhythm: Normal rate and regular rhythm.     Heart sounds: Normal heart sounds.  Pulmonary:     Effort: Pulmonary effort is normal. No respiratory distress.     Breath sounds: Normal breath sounds.  Abdominal:     Palpations: Abdomen is soft.  Lymphadenopathy:     Cervical: No  cervical adenopathy.  Skin:    General: Skin is warm and dry.  Neurological:     General: No focal deficit present.     Mental Status: She is alert and oriented to person, place, and time. Mental status is at baseline.  Psychiatric:        Behavior: Behavior normal.        Thought Content: Thought content normal.        Judgment: Judgment normal.         Assessment & Plan    1. Pre-diabetes  - POCT glycosylated hemoglobin (Hb A1C)  2. Osteoporosis, unspecified osteoporosis type, unspecified pathological fracture presence Repeat BMD as fracture from fall is pt biggest risk factor. - DG Bone Density; Future  3. Lichen sclerosus  - clobetasol ointment (TEMOVATE) 0.05 %; Apply 1 application topically 2 (two) times daily.  Dispense: 30 g; Refill: 0     I have done the exam and reviewed the above chart and it is accurate to the best of my knowledge. Development worker, community has been used in this note in any air is in the dictation or transcription are unintentional.  [name], MD  [place]

## 2018-05-23 ENCOUNTER — Telehealth: Payer: Self-pay | Admitting: Family Medicine

## 2018-05-23 MED ORDER — BENZONATATE 200 MG PO CAPS: 200.0000 mg | ORAL_CAPSULE | Freq: Two times a day (BID) | ORAL | 0 refills | Status: DC | PRN

## 2018-05-23 NOTE — Telephone Encounter (Signed)
Pt doesn't feel like coming in to office and doesn't want to ask people who are old to bring her to a visit when she is sick. Pt has been coughing a lot making her feel weak.  She coughs worse at night. Pt asking for something to be called in for her to stop coughing.    [id number] [address], [place] [phone] (Phone) [phone] (Fax)   Please advise.  Thanks, [employer]

## 2018-05-23 NOTE — Telephone Encounter (Signed)
Spoke with the patient, and she reports that she has had cough and congestion for the last 7 days. She denies fever, body aches, or chills. She reports that the cough is more productive than it has been, and it is worse at night. She has taken Robitussin and Mucinex with no relief. Patient reports that her "coughing spells" make her feel so weak at times. Patient is requesting something be called into the pharmacy. Please advise. Thanks!

## 2018-05-23 NOTE — Telephone Encounter (Signed)
Per Dr. Rosanna Randy, ok to send in [employer]. If patient not improved, then will need to be seen.

## 2018-05-28 ENCOUNTER — Ambulatory Visit (INDEPENDENT_AMBULATORY_CARE_PROVIDER_SITE_OTHER): Payer: PPO | Admitting: Family Medicine

## 2018-05-28 ENCOUNTER — Encounter: Payer: Self-pay | Admitting: Family Medicine

## 2018-05-28 VITALS — BP 155/81 | HR 70 | Temp 98.1°F | Wt 157.6 lb

## 2018-05-28 DIAGNOSIS — J069 Acute upper respiratory infection, unspecified: Secondary | ICD-10-CM

## 2018-05-28 MED ORDER — AZITHROMYCIN 250 MG PO TABS: ORAL_TABLET | ORAL | 0 refills | Status: DC

## 2018-05-28 MED ORDER — GUAIFENESIN ER 600 MG PO TB12: 600.0000 mg | ORAL_TABLET | Freq: Two times a day (BID) | ORAL | 0 refills | Status: DC | PRN

## 2018-05-28 NOTE — Patient Instructions (Signed)
Acute Bronchitis, Adult Acute bronchitis is when air tubes (bronchi) in the lungs suddenly get swollen. The condition can make it hard to breathe. It can also cause these symptoms:  A cough.  Coughing up clear, yellow, or green mucus.  Wheezing.  Chest congestion.  Shortness of breath.  A fever.  Body aches.  Chills.  A sore throat. Follow these instructions at home:  Medicines  Take over-the-counter and prescription medicines only as told by your doctor.  If you were prescribed an antibiotic medicine, take it as told by your doctor. Do not stop taking the antibiotic even if you start to feel better. General instructions  Rest.  Drink enough fluids to keep your pee (urine) pale yellow.  Avoid smoking and secondhand smoke. If you smoke and you need help quitting, ask your doctor. Quitting will help your lungs heal faster.  Use an inhaler, cool mist vaporizer, or humidifier as told by your doctor.  Keep all follow-up visits as told by your doctor. This is important. How is this prevented? To lower your risk of getting this condition again:  Wash your hands often with soap and water. If you cannot use soap and water, use hand sanitizer.  Avoid contact with people who have cold symptoms.  Try not to touch your hands to your mouth, nose, or eyes.  Make sure to get the flu shot every year. Contact a doctor if:  Your symptoms do not get better in 2 weeks. Get help right away if:  You cough up blood.  You have chest pain.  You have very bad shortness of breath.  You become dehydrated.  You faint (pass out) or keep feeling like you are going to pass out.  You keep throwing up (vomiting).  You have a very bad headache.  Your fever or chills gets worse. This information is not intended to replace advice given to you by your health care provider. Make sure you discuss any questions you have with your health care provider. Document Released: 10/19/2007 Document  Revised: 12/14/2016 Document Reviewed: 10/21/2015 Elsevier Interactive Patient Education  2019 [employer]  

## 2018-05-28 NOTE — Progress Notes (Signed)
Patient: Deanna Williams Female    DOB: 04/18/1929   85 y.o.   MRN: [id number] Visit Date: 05/28/2018  Today's Provider: Lavon Paganini, MD   Chief Complaint  Patient presents with  . Cough   Subjective:    I, Tiburcio Pea, CMA, am acting as a scribe for Lavon Paganini, MD.   URI   This is a new problem. Episode onset: 10 days ago  The problem has been gradually worsening. There has been no fever. Associated symptoms include congestion and coughing. Associated symptoms comments: Fatigue . Treatments tried: Tessalon Pearles and Robitussin  The treatment provided mild relief.    No Known Allergies   Current Outpatient Medications:  .  alendronate (FOSAMAX) 70 MG tablet, TAKE 1 TABLET BY MOUTH ONCE WEEKLY WITH A FULL GLASS OF WATER ON AN EMPTY STOMACH, Disp: 4 tablet, Rfl: 11 .  clobetasol ointment (TEMOVATE) 7.40 %, Apply 1 application topically 2 (two) times daily., Disp: 30 g, Rfl: 0 .  terconazole (TERAZOL 3) 0.8 % vaginal cream, Place 1 applicator vaginally at bedtime., Disp: 20 g, Rfl: 0  Review of Systems  Constitutional: Positive for fatigue.  HENT: Positive for congestion.   Respiratory: Positive for cough.   Cardiovascular: Negative.   Musculoskeletal: Negative.     Social History   Tobacco Use  . Smoking status: Never Smoker  . Smokeless tobacco: Never Used  Substance Use Topics  . Alcohol use: Yes    Alcohol/week: 0.0 standard drinks    Comment: rare- wine      Objective:   BP (!) 155/81 (BP Location: Right Arm, Patient Position: Sitting, Cuff Size: Normal)   Pulse 70   Temp 98.1 F (36.7 C) (Oral)   Wt 157 lb 9.6 oz (71.5 kg)   SpO2 96%   BMI 27.92 kg/m  Vitals:   05/28/18 1519  BP: (!) 155/81  Pulse: 70  Temp: 98.1 F (36.7 C)  TempSrc: Oral  SpO2: 96%  Weight: 157 lb 9.6 oz (71.5 kg)     Physical Exam Vitals signs reviewed.  Constitutional:      General: She is not in acute distress.    Appearance: Normal appearance. She  is not diaphoretic.  HENT:     Head: Normocephalic and atraumatic.     Right Ear: Tympanic membrane, ear canal and external ear normal.     Left Ear: Tympanic membrane, ear canal and external ear normal.     Nose: Congestion and rhinorrhea present.     Mouth/Throat:     Mouth: Mucous membranes are moist.     Pharynx: No oropharyngeal exudate or posterior oropharyngeal erythema.  Eyes:     General: No scleral icterus.    Conjunctiva/sclera: Conjunctivae normal.     Pupils: Pupils are equal, round, and reactive to light.  Neck:     Musculoskeletal: Neck supple.  Cardiovascular:     Rate and Rhythm: Normal rate and regular rhythm.     Pulses: Normal pulses.     Heart sounds: Normal heart sounds. No murmur.  Pulmonary:     Effort: Pulmonary effort is normal. No respiratory distress.     Breath sounds: Rhonchi (in bilateral bases) present.  Abdominal:     General: There is no distension.     Palpations: Abdomen is soft.     Tenderness: There is no abdominal tenderness.  Musculoskeletal:     Right lower leg: No edema.     Left lower leg: No edema.  Lymphadenopathy:     Cervical: No cervical adenopathy.  Skin:    General: Skin is warm and dry.     Capillary Refill: Capillary refill takes less than 2 seconds.     Findings: No rash.  Neurological:     Mental Status: She is alert and oriented to person, place, and time. Mental status is at baseline.  Psychiatric:        Mood and Affect: Mood normal.        Behavior: Behavior normal.        Assessment & Plan   1. Upper respiratory tract infection, unspecified type - suspect bronchitis  - no evidence of AOM, CAP, strep pharyngitis, or other infection - given duration of symptoms, will treat with with azithromycin - discussed symptomatic management, natural course, and return precautions     Meds ordered this encounter  Medications  . azithromycin (ZITHROMAX) 250 MG tablet    Sig: Take 500mg  PO daily x1d and then 250mg   daily x4 days    Dispense:  6 each    Refill:  0  . guaiFENesin (MUCINEX) 600 MG 12 hr tablet    Sig: Take 1 tablet (600 mg total) by mouth 2 (two) times daily as needed for cough or to loosen phlegm.    Dispense:  40 tablet    Refill:  0     Return if symptoms worsen or fail to improve.   The entirety of the information documented in the History of Present Illness, Review of Systems and Physical Exam were personally obtained by me. Portions of this information were initially documented by Tiburcio Pea, CMA and reviewed by me for thoroughness and accuracy.    Virginia Crews, MD, MPH [facility] 05/30/2018 9:35 AM

## 2018-06-09 DIAGNOSIS — R05 Cough: Secondary | ICD-10-CM | POA: Diagnosis not present

## 2018-06-09 DIAGNOSIS — B349 Viral infection, unspecified: Secondary | ICD-10-CM | POA: Diagnosis not present

## 2018-06-09 DIAGNOSIS — R197 Diarrhea, unspecified: Secondary | ICD-10-CM | POA: Diagnosis not present

## 2018-06-11 ENCOUNTER — Telehealth: Payer: Self-pay

## 2018-06-11 NOTE — Telephone Encounter (Signed)
Noted.  Will also forward to PCP as [facility]

## 2018-06-11 NOTE — Telephone Encounter (Signed)
Patient called on 05/20/2018 c/o diarrhea and weakness. Patient reports that she was seen for bronchitis finished antibiotics and started with diarrhea. Patient was advised by triage nurse Myriam Forehand RN to go to the ER.

## 2018-06-25 ENCOUNTER — Ambulatory Visit
Admission: RE | Admit: 2018-06-25 | Discharge: 2018-06-25 | Disposition: A | Discharge status: Home / Self Care | Payer: PPO | Source: Ambulatory Visit | Attending: Family Medicine | Admitting: Family Medicine

## 2018-06-25 DIAGNOSIS — M810 Age-related osteoporosis without current pathological fracture: Secondary | ICD-10-CM | POA: Insufficient documentation

## 2018-06-25 DIAGNOSIS — M85852 Other specified disorders of bone density and structure, left thigh: Secondary | ICD-10-CM | POA: Diagnosis not present

## 2018-06-25 DIAGNOSIS — Z780 Asymptomatic menopausal state: Secondary | ICD-10-CM | POA: Diagnosis not present

## 2018-06-27 ENCOUNTER — Telehealth: Payer: Self-pay | Admitting: Family Medicine

## 2018-06-27 NOTE — Telephone Encounter (Signed)
Pt advised.   Thanks,   -Kymora Sciara  

## 2018-06-27 NOTE — Telephone Encounter (Signed)
LMTCB 06/27/2018  Thanks,   -Mickel Baas

## 2018-06-27 NOTE — Telephone Encounter (Signed)
-----   Message from Jerrol Banana., MD sent at 06/26/2018  4:18 PM EST ----- BMD is osteopenic.  Continue to work on walking and calcium with vitamin D daily

## 2018-06-27 NOTE — Telephone Encounter (Signed)
Pt returned missed call.  Please call pt back. ° °Thanks, °TGH °

## 2018-07-18 ENCOUNTER — Ambulatory Visit
Admission: RE | Admit: 2018-07-18 | Discharge: 2018-07-18 | Disposition: A | Discharge status: Home / Self Care | Payer: PPO | Source: Ambulatory Visit | Attending: Family Medicine | Admitting: Family Medicine

## 2018-07-18 ENCOUNTER — Other Ambulatory Visit: Payer: Self-pay | Admitting: Family Medicine

## 2018-07-18 ENCOUNTER — Ambulatory Visit
Admission: RE | Admit: 2018-07-18 | Discharge: 2018-07-18 | Disposition: A | Discharge status: Home / Self Care | Payer: PPO | Attending: Family Medicine | Admitting: Family Medicine

## 2018-07-18 ENCOUNTER — Other Ambulatory Visit: Payer: Self-pay

## 2018-07-18 ENCOUNTER — Encounter: Payer: Self-pay | Admitting: Family Medicine

## 2018-07-18 ENCOUNTER — Ambulatory Visit (INDEPENDENT_AMBULATORY_CARE_PROVIDER_SITE_OTHER): Payer: PPO | Admitting: Family Medicine

## 2018-07-18 VITALS — BP 122/60 | HR 70 | Temp 98.2°F | Resp 16 | Wt 159.0 lb

## 2018-07-18 DIAGNOSIS — I509 Heart failure, unspecified: Secondary | ICD-10-CM | POA: Diagnosis not present

## 2018-07-18 DIAGNOSIS — R059 Cough, unspecified: Secondary | ICD-10-CM

## 2018-07-18 DIAGNOSIS — J40 Bronchitis, not specified as acute or chronic: Secondary | ICD-10-CM | POA: Insufficient documentation

## 2018-07-18 DIAGNOSIS — R05 Cough: Secondary | ICD-10-CM

## 2018-07-18 DIAGNOSIS — R0602 Shortness of breath: Secondary | ICD-10-CM

## 2018-07-18 DIAGNOSIS — R5383 Other fatigue: Secondary | ICD-10-CM | POA: Diagnosis not present

## 2018-07-18 DIAGNOSIS — M810 Age-related osteoporosis without current pathological fracture: Secondary | ICD-10-CM

## 2018-07-18 MED ORDER — PREDNISONE 10 MG (21) PO TBPK: ORAL_TABLET | ORAL | 0 refills | Status: DC

## 2018-07-18 MED ORDER — DOXYCYCLINE HYCLATE 100 MG PO TABS: 100.0000 mg | ORAL_TABLET | Freq: Two times a day (BID) | ORAL | 0 refills | Status: DC

## 2018-07-18 NOTE — Progress Notes (Addendum)
Patient: Deanna Williams Female    DOB: January 17, 1929   85 y.o.   MRN: [id number] Visit Date: 07/18/2018  Today's Provider: [name], MD   Chief Complaint  Patient presents with  . Cough   Subjective:     Cough  This is a new problem. The current episode started more than 1 month ago (at least). The problem has been unchanged. The problem occurs constantly. The cough is non-productive. Associated symptoms include postnasal drip and shortness of breath. Pertinent negatives include no chest pain, headaches or wheezing.  She has had this cough for about 2 months now.  It is hard to say that she has any orthopnea but she definitely had dyspnea on exertion and she states she has had some chest tightness recently.  This does not sound anginal however.  The cough is the big issue.  I have to ask a lot of questions to get the tightness in the dyspnea from the patient.  She  admits to some mild fatigue over the past couple of months. Patient reports that she has been treated for this about 1 month ago by Dr. Jacinto Reap, and she was prescribed Zpak and mucinex DM. She reports that a few days later she went to urgent care due to having diarrhea possibly from the antibiotic. CXR was also ordered and was WNL. She reports that she has been off of the abx and still has a cough.   No Known Allergies   Current Outpatient Medications:  .  alendronate (FOSAMAX) 70 MG tablet, TAKE 1 TABLET BY MOUTH ONCE WEEKLY WITH A FULL GLASS OF WATER ON AN EMPTY STOMACH, Disp: 4 tablet, Rfl: 11 .  Calcium Carb-Cholecalciferol (CALCIUM 1000 + D) 1000-800 MG-UNIT TABS, Take by mouth daily., Disp: , Rfl:  .  clobetasol ointment (TEMOVATE) 2.87 %, Apply 1 application topically 2 (two) times daily., Disp: 30 g, Rfl: 0 .  azithromycin (ZITHROMAX) 250 MG tablet, Take 500mg  PO daily x1d and then 250mg  daily x4 days (Patient not taking: Reported on 07/18/2018), Disp: 6 each, Rfl: 0 .  guaiFENesin (MUCINEX) 600 MG 12 hr tablet,  Take 1 tablet (600 mg total) by mouth 2 (two) times daily as needed for cough or to loosen phlegm. (Patient not taking: Reported on 07/18/2018), Disp: 40 tablet, Rfl: 0 .  terconazole (TERAZOL 3) 0.8 % vaginal cream, Place 1 applicator vaginally at bedtime. (Patient not taking: Reported on 07/18/2018), Disp: 20 g, Rfl: 0  Review of Systems  Constitutional: Negative.   HENT: Positive for postnasal drip. Negative for congestion.   Eyes: Negative.   Respiratory: Positive for cough and shortness of breath. Negative for apnea, choking, chest tightness and wheezing.   Cardiovascular: Negative for chest pain, palpitations and leg swelling.  Gastrointestinal: Negative for diarrhea, nausea and vomiting.  Endocrine: Negative.   Allergic/Immunologic: Negative.   Neurological: Positive for light-headedness. Negative for dizziness and headaches.  Hematological: Negative.   Psychiatric/Behavioral: Negative.     Social History   Tobacco Use  . Smoking status: Never Smoker  . Smokeless tobacco: Never Used  Substance Use Topics  . Alcohol use: Yes    Alcohol/week: 0.0 standard drinks    Comment: rare- wine      Objective:   BP 122/60 (BP Location: Left Arm, Patient Position: Sitting, Cuff Size: Normal)   Pulse 70   Temp 98.2 F (36.8 C)   Resp 16   Wt 159 lb (72.1 kg)   SpO2 98%  BMI 28.17 kg/m  Vitals:   07/18/18 1444  BP: 122/60  Pulse: 70  Resp: 16  Temp: 98.2 F (36.8 C)  SpO2: 98%  Weight: 159 lb (72.1 kg)     Physical Exam Vitals signs reviewed.  Constitutional:      Appearance: Normal appearance. She is well-developed.  HENT:     Head: Normocephalic and atraumatic.     Right Ear: Tympanic membrane and external ear normal.     Left Ear: Tympanic membrane and external ear normal.     Nose: Nose normal.  Eyes:     General: No scleral icterus.    Conjunctiva/sclera: Conjunctivae normal.     Pupils: Pupils are equal, round, and reactive to light.  Neck:     Thyroid: No  thyromegaly.  Cardiovascular:     Rate and Rhythm: Normal rate and regular rhythm.     Heart sounds: Normal heart sounds.  Pulmonary:     Effort: Pulmonary effort is normal.     Breath sounds: Normal breath sounds.  Abdominal:     Palpations: Abdomen is soft.  Musculoskeletal:     Right lower leg: No edema.     Left lower leg: No edema.  Lymphadenopathy:     Cervical: No cervical adenopathy.  Skin:    General: Skin is warm and dry.  Neurological:     General: No focal deficit present.     Mental Status: She is alert and oriented to person, place, and time. Mental status is at baseline.  Psychiatric:        Mood and Affect: Mood normal.        Behavior: Behavior normal.        Thought Content: Thought content normal.   ECG reveals normal sinus rhythm with poor R wave progression and negativeprecordial T waves      Assessment & Plan    1. Cough  - Comprehensive metabolic panel - DG Chest 2 View; Future  2. Shortness of breath  - EKG 12-Lead - DG Chest 2 View; Future  3. Fatigue, unspecified type  - CBC with Differential/Platelet - TSH  4. Congestive heart failure, unspecified HF chronicity, unspecified heart failure type ([place]) Work-up for CHF.  If It is positive  will need cardiac evaluation and diuretics. - Pro b natriuretic peptide (BNP)[facility])  5. Bronchitis Treat and chronic bronchitis possible walking pneumonia with doxycycline and prednisone taper.  I will see her back in a week.  Xopenex did not seem to help her in the office. - Comprehensive metabolic panel - doxycycline (VIBRA-TABS) 100 MG tablet; Take 1 tablet (100 mg total) by mouth 2 (two) times daily.  Dispense: 20 tablet; Refill: 0 - predniSONE (STERAPRED UNI-PAK 21 TAB) 10 MG (21) TBPK tablet; Take 6 tablets the first day and decrease by one tablet daily  Dispense: 21 tablet; Refill: 0 - DG Chest 2 View; Future    I have done the exam and reviewed the above chart and it  is accurate to the best of my knowledge. Development worker, community has been used in this note in any air is in the dictation or transcription are unintentional.  [name], MD  [place]

## 2018-07-18 NOTE — Telephone Encounter (Signed)
Pharmacy requesting refills. Thanks!  

## 2018-07-19 DIAGNOSIS — J40 Bronchitis, not specified as acute or chronic: Secondary | ICD-10-CM | POA: Diagnosis not present

## 2018-07-19 DIAGNOSIS — R5383 Other fatigue: Secondary | ICD-10-CM | POA: Diagnosis not present

## 2018-07-19 DIAGNOSIS — I509 Heart failure, unspecified: Secondary | ICD-10-CM | POA: Diagnosis not present

## 2018-07-19 DIAGNOSIS — R05 Cough: Secondary | ICD-10-CM | POA: Diagnosis not present

## 2018-07-20 LAB — CBC WITH DIFFERENTIAL/PLATELET
Basophils Absolute: 0 10*3/uL (ref 0.0–0.2)
Basos: 1 %
EOS (ABSOLUTE): 0.1 10*3/uL (ref 0.0–0.4)
Eos: 1 %
Hematocrit: 43.8 % (ref 34.0–46.6)
Hemoglobin: 14.3 g/dL (ref 11.1–15.9)
Immature Grans (Abs): 0 10*3/uL (ref 0.0–0.1)
Immature Granulocytes: 0 %
Lymphocytes Absolute: 1.6 10*3/uL (ref 0.7–3.1)
Lymphs: 28 %
MCH: 30.8 pg (ref 26.6–33.0)
MCHC: 32.6 g/dL (ref 31.5–35.7)
MCV: 94 fL (ref 79–97)
Monocytes Absolute: 0.4 10*3/uL (ref 0.1–0.9)
Monocytes: 7 %
Neutrophils Absolute: 3.6 10*3/uL (ref 1.4–7.0)
Neutrophils: 63 %
Platelets: 257 10*3/uL (ref 150–450)
RBC: 4.64 x10E6/uL (ref 3.77–5.28)
RDW: 11.9 % (ref 11.7–15.4)
WBC: 5.7 10*3/uL (ref 3.4–10.8)

## 2018-07-20 LAB — COMPREHENSIVE METABOLIC PANEL
ALT: 7 IU/L (ref 0–32)
AST: 14 IU/L (ref 0–40)
Albumin/Globulin Ratio: 2.5 — ABNORMAL HIGH (ref 1.2–2.2)
Albumin: 4.5 g/dL (ref 3.6–4.6)
Alkaline Phosphatase: 67 IU/L (ref 39–117)
BUN/Creatinine Ratio: 18 (ref 12–28)
BUN: 16 mg/dL (ref 8–27)
Bilirubin Total: 0.5 mg/dL (ref 0.0–1.2)
CO2: 19 mmol/L — ABNORMAL LOW (ref 20–29)
Calcium: 9.8 mg/dL (ref 8.7–10.3)
Chloride: 103 mmol/L (ref 96–106)
Creatinine, Ser: 0.87 mg/dL (ref 0.57–1.00)
GFR calc Af Amer: 69 mL/min/{1.73_m2} (ref >59)
GFR calc non Af Amer: 60 mL/min/{1.73_m2} (ref >59)
Globulin, Total: 1.8 g/dL (ref 1.5–4.5)
Glucose: 102 mg/dL — ABNORMAL HIGH (ref 65–99)
Potassium: 4.5 mmol/L (ref 3.5–5.2)
Sodium: 139 mmol/L (ref 134–144)
Total Protein: 6.3 g/dL (ref 6.0–8.5)

## 2018-07-20 LAB — TSH: TSH: 3.39 u[IU]/mL (ref 0.450–4.500)

## 2018-07-20 LAB — PRO B NATRIURETIC PEPTIDE: NT-Pro BNP: 131 pg/mL (ref 0–738)

## 2018-07-24 ENCOUNTER — Telehealth: Payer: Self-pay

## 2018-07-24 NOTE — Telephone Encounter (Signed)
Patient advised as directed below. 

## 2018-07-24 NOTE — Telephone Encounter (Signed)
-----   Message from Jerrol Banana., MD sent at 07/23/2018  1:53 PM EDT ----- Chest x-ray okay.

## 2018-07-24 NOTE — Telephone Encounter (Signed)
-----   Message from Jerrol Banana., MD sent at 07/23/2018  1:54 PM EDT ----- Labs all good.

## 2018-07-24 NOTE — Telephone Encounter (Signed)
LMTCB

## 2018-08-02 ENCOUNTER — Ambulatory Visit (INDEPENDENT_AMBULATORY_CARE_PROVIDER_SITE_OTHER): Payer: PPO | Admitting: Family Medicine

## 2018-08-02 ENCOUNTER — Other Ambulatory Visit: Payer: Self-pay

## 2018-08-02 ENCOUNTER — Encounter: Payer: Self-pay | Admitting: Family Medicine

## 2018-08-02 VITALS — BP 120/70 | HR 75 | Temp 97.9°F | Ht 63.0 in | Wt 159.2 lb

## 2018-08-02 DIAGNOSIS — K219 Gastro-esophageal reflux disease without esophagitis: Secondary | ICD-10-CM | POA: Diagnosis not present

## 2018-08-02 DIAGNOSIS — R059 Cough, unspecified: Secondary | ICD-10-CM

## 2018-08-02 DIAGNOSIS — M810 Age-related osteoporosis without current pathological fracture: Secondary | ICD-10-CM | POA: Diagnosis not present

## 2018-08-02 DIAGNOSIS — I10 Essential (primary) hypertension: Secondary | ICD-10-CM

## 2018-08-02 DIAGNOSIS — R05 Cough: Secondary | ICD-10-CM | POA: Diagnosis not present

## 2018-08-02 MED ORDER — BENZONATATE 100 MG PO CAPS: 100.0000 mg | ORAL_CAPSULE | Freq: Three times a day (TID) | ORAL | 1 refills | Status: DC

## 2018-08-02 NOTE — Progress Notes (Signed)
Patient: Deanna Williams Female    DOB: 05-31-1928   85 y.o.   MRN: [id number] Visit Date: 08/02/2018  Today's Provider: [name], MD   Chief Complaint  Patient presents with  . Follow-up    2 week bronchitis.  the cough is still there   Subjective:     HPI   Follow up for bronchitis  The patient was last seen for this 2 weeks ago. Changes made at last visit include treated with doxycycline and prednisone taper  She reports good compliance with treatment. She feels that condition is Improved.but the cough hasn't gone away. She is not having side effects.   ------------------------------------------------------------------------------------   No Known Allergies   Current Outpatient Medications:  .  Calcium Carb-Cholecalciferol (CALCIUM 1000 + D) 1000-800 MG-UNIT TABS, Take by mouth daily., Disp: , Rfl:  .  clobetasol ointment (TEMOVATE) 7.82 %, Apply 1 application topically 2 (two) times daily., Disp: 30 g, Rfl: 0 .  terconazole (TERAZOL 3) 0.8 % vaginal cream, Place 1 applicator vaginally at bedtime., Disp: 20 g, Rfl: 0 .  alendronate (FOSAMAX) 10 MG tablet, Take 1 tablet (10 mg total) by mouth daily before breakfast. (Patient not taking: Reported on 08/02/2018), Disp: 30 tablet, Rfl: 11 .  azithromycin (ZITHROMAX) 250 MG tablet, Take 500mg  PO daily x1d and then 250mg  daily x4 days (Patient not taking: Reported on 07/18/2018), Disp: 6 each, Rfl: 0 .  doxycycline (VIBRA-TABS) 100 MG tablet, Take 1 tablet (100 mg total) by mouth 2 (two) times daily. (Patient not taking: Reported on 08/02/2018), Disp: 20 tablet, Rfl: 0 .  guaiFENesin (MUCINEX) 600 MG 12 hr tablet, Take 1 tablet (600 mg total) by mouth 2 (two) times daily as needed for cough or to loosen phlegm. (Patient not taking: Reported on 07/18/2018), Disp: 40 tablet, Rfl: 0 .  predniSONE (STERAPRED UNI-PAK 21 TAB) 10 MG (21) TBPK tablet, Take 6 tablets the first day and decrease by one tablet daily (Patient not  taking: Reported on 08/02/2018), Disp: 21 tablet, Rfl: 0  Review of Systems  Constitutional: Negative.   HENT: Negative.   Eyes: Negative.   Respiratory: Positive for cough (non productive and makes her feel week) and chest tightness.   Cardiovascular: Negative.   Gastrointestinal: Negative.   Endocrine: Negative.   Genitourinary: Positive for frequency.  Musculoskeletal: Negative.   Skin: Negative.   Allergic/Immunologic: Negative.   Neurological: Negative.   Hematological: Negative.   Psychiatric/Behavioral: Negative.     Social History   Tobacco Use  . Smoking status: Never Smoker  . Smokeless tobacco: Never Used  Substance Use Topics  . Alcohol use: Yes    Alcohol/week: 0.0 standard drinks    Comment: rare- wine      Objective:   BP 120/70 (BP Location: Right Arm, Patient Position: Sitting, Cuff Size: Normal)   Pulse 75   Temp 97.9 F (36.6 C) (Oral)   Ht 5\' 3"  (1.6 m)   Wt 159 lb 3.2 oz (72.2 kg)   SpO2 92%   BMI 28.20 kg/m  Vitals:   08/02/18 0830  BP: 120/70  Pulse: 75  Temp: 97.9 F (36.6 C)  TempSrc: Oral  SpO2: 92%  Weight: 159 lb 3.2 oz (72.2 kg)  Height: 5\' 3"  (1.6 m)     Physical Exam Vitals signs reviewed.  Constitutional:      Appearance: Normal appearance. She is well-developed.  HENT:     Head: Normocephalic and atraumatic.  Right Ear: Tympanic membrane and external ear normal.     Left Ear: Tympanic membrane and external ear normal.     Nose: Nose normal.  Eyes:     General: No scleral icterus.    Conjunctiva/sclera: Conjunctivae normal.     Pupils: Pupils are equal, round, and reactive to light.  Neck:     Thyroid: No thyromegaly.  Cardiovascular:     Rate and Rhythm: Normal rate and regular rhythm.     Heart sounds: Normal heart sounds.  Pulmonary:     Effort: Pulmonary effort is normal.     Breath sounds: Normal breath sounds.  Abdominal:     Palpations: Abdomen is soft.  Musculoskeletal:     Right lower leg: No  edema.     Left lower leg: No edema.  Lymphadenopathy:     Cervical: No cervical adenopathy.  Skin:    General: Skin is warm and dry.  Neurological:     General: No focal deficit present.     Mental Status: She is alert and oriented to person, place, and time. Mental status is at baseline.  Psychiatric:        Mood and Affect: Mood normal.        Behavior: Behavior normal.        Thought Content: Thought content normal.   O2 sat is 97%      Assessment & Plan    1. Cough Improving at least 50% since last visit. Follow clinically.Normal CXR except for hiatal hernia. - benzonatate (TESSALON) 100 MG capsule; Take 1 capsule (100 mg total) by mouth 3 (three) times daily.  Dispense: 60 capsule; Refill: 1 2. Essential hypertension   3. Gastroesophageal reflux disease without esophagitis   4. Age-related osteoporosis without current pathological fracture      I have done the exam and reviewed the above chart and it is accurate to the best of my knowledge. Development worker, community has been used in this note in any air is in the dictation or transcription are unintentional.  [name], MD  [place]

## 2018-09-27 ENCOUNTER — Telehealth: Payer: Self-pay

## 2018-09-27 DIAGNOSIS — J40 Bronchitis, not specified as acute or chronic: Secondary | ICD-10-CM

## 2018-09-27 DIAGNOSIS — R05 Cough: Secondary | ICD-10-CM

## 2018-09-27 DIAGNOSIS — R059 Cough, unspecified: Secondary | ICD-10-CM

## 2018-09-27 MED ORDER — BENZONATATE 100 MG PO CAPS: 100.0000 mg | ORAL_CAPSULE | Freq: Three times a day (TID) | ORAL | 1 refills | Status: DC

## 2018-09-27 MED ORDER — DOXYCYCLINE HYCLATE 100 MG PO TABS: 100.0000 mg | ORAL_TABLET | Freq: Two times a day (BID) | ORAL | 0 refills | Status: AC

## 2018-09-27 NOTE — Telephone Encounter (Signed)
Patient called stating that she is having a really bad cough, that is non productive but is keeping her up. She states that she had the same cough a few months ago and Dr. Rosanna Randy treated her.

## 2018-09-27 NOTE — Telephone Encounter (Signed)
Advised patient that per Dr. Rosanna Randy, we will send in Doxy X 5 days and tessalon Perles for cough. If she is not feeling better next week, will need OV for evaluation.

## 2018-10-02 ENCOUNTER — Ambulatory Visit: Payer: Self-pay | Admitting: Family Medicine

## 2018-11-01 ENCOUNTER — Ambulatory Visit: Payer: Self-pay | Admitting: Family Medicine

## 2018-12-06 DIAGNOSIS — H2513 Age-related nuclear cataract, bilateral: Secondary | ICD-10-CM | POA: Diagnosis not present

## 2018-12-06 DIAGNOSIS — H43392 Other vitreous opacities, left eye: Secondary | ICD-10-CM | POA: Diagnosis not present

## 2019-03-14 DIAGNOSIS — D2230 Melanocytic nevi of unspecified part of face: Secondary | ICD-10-CM | POA: Diagnosis not present

## 2019-03-14 DIAGNOSIS — L821 Other seborrheic keratosis: Secondary | ICD-10-CM | POA: Diagnosis not present

## 2019-03-14 DIAGNOSIS — L578 Other skin changes due to chronic exposure to nonionizing radiation: Secondary | ICD-10-CM | POA: Diagnosis not present

## 2019-03-14 DIAGNOSIS — L820 Inflamed seborrheic keratosis: Secondary | ICD-10-CM | POA: Diagnosis not present

## 2019-03-14 DIAGNOSIS — Z1283 Encounter for screening for malignant neoplasm of skin: Secondary | ICD-10-CM | POA: Diagnosis not present

## 2019-03-14 DIAGNOSIS — D225 Melanocytic nevi of trunk: Secondary | ICD-10-CM | POA: Diagnosis not present

## 2019-04-08 ENCOUNTER — Other Ambulatory Visit: Payer: Self-pay | Admitting: *Deleted

## 2019-04-08 DIAGNOSIS — Z20822 Contact with and (suspected) exposure to covid-19: Secondary | ICD-10-CM

## 2019-04-09 LAB — NOVEL CORONAVIRUS, NAA: SARS-CoV-2, NAA: NOT DETECTED

## 2019-04-09 NOTE — Progress Notes (Signed)
Patient: Deanna Williams Female    DOB: [birth year]   85 y.o.   MRN: [id number] Visit Date: 04/10/2019  Today's Provider: Mar Daring, PA-C   Chief Complaint  Patient presents with  . Abdominal Pain   Subjective:      HPI  Patient reports she that on Sunday she was sitting in [place] and felt intense abdominal cramping. She felt bloated and nauseated and felt she needed to have a BM (diarrhea). She went home and tried to have a BM but could not. She reports she sat on the toilet for a while and passed some gas. She was still a little crampy so she went a laid down. When she awoke she felt better. She has had no symptoms since. She went to have a covid test done on Monday due to these symptoms, which was negative.    No Known Allergies   Current Outpatient Medications:  .  Calcium Carb-Cholecalciferol (CALCIUM 1000 + D) 1000-800 MG-UNIT TABS, Take by mouth daily., Disp: , Rfl:  .  clobetasol ointment (TEMOVATE) [id number] %, Apply 1 application topically 2 (two) times daily., Disp: 30 g, Rfl: 0 .  Omega-3 Fatty Acids (FISH OIL) 1000 MG CPDR, Take by mouth., Disp: , Rfl:  .  terconazole (TERAZOL 3) 0.8 % vaginal cream, Place 1 applicator vaginally at bedtime., Disp: 20 g, Rfl: 0  Review of Systems  Constitutional: Negative.   Respiratory: Negative.   Cardiovascular: Negative.   Gastrointestinal: Negative.   Genitourinary: Negative.   Neurological: Negative.     Social History   Tobacco Use  . Smoking status: Never Smoker  . Smokeless tobacco: Never Used  Substance Use Topics  . Alcohol use: Yes    Alcohol/week: 0.0 standard drinks    Comment: rare- wine      Objective:   BP (!) 148/76 (BP Location: Left Arm, Patient Position: Sitting, Cuff Size: Normal)   Pulse 70   Temp (!) 97.1 F (36.2 C) (Temporal)   Resp 16   Wt 153 lb 12.8 oz (69.8 kg)   BMI 27.24 kg/m  Vitals:   04/10/19 0817  BP: (!) 148/76  Pulse: 70  Resp: 16  Temp: (!) 97.1 F (36.2 C)   TempSrc: Temporal  Weight: 153 lb 12.8 oz (69.8 kg)  Body mass index is 27.24 kg/m.   Physical Exam Vitals signs reviewed.  Constitutional:      General: She is not in acute distress.    Appearance: She is well-developed and normal weight. She is not ill-appearing or diaphoretic.  HENT:     Head: Normocephalic and atraumatic.  Eyes:     General: No scleral icterus.    Extraocular Movements: Extraocular movements intact.  Neck:     Musculoskeletal: Normal range of motion and neck supple.     Thyroid: No thyromegaly.     Vascular: No JVD.     Trachea: No tracheal deviation.  Cardiovascular:     Rate and Rhythm: Normal rate and regular rhythm.     Heart sounds: Normal heart sounds. No murmur. No friction rub. No gallop.   Pulmonary:     Effort: Pulmonary effort is normal. No respiratory distress.     Breath sounds: Normal breath sounds. No wheezing or rales.  Abdominal:     General: Abdomen is flat. Bowel sounds are normal. There is no distension.     Palpations: Abdomen is soft. There is no hepatomegaly, splenomegaly or mass.  Tenderness: There is no abdominal tenderness.     Hernia: No hernia is present.  Lymphadenopathy:     Cervical: No cervical adenopathy.  Neurological:     Mental Status: She is alert.     No results found for any visits on 04/10/19.     Assessment & Plan    1. Generalized abdominal pain Symptoms have completely resolved currently. Will check labs as below and will f/u pending results. Patient advised to call if symptoms recur.  - CBC w/Diff/Platelet - Comprehensive Metabolic Panel (CMET) - Lipase     Mar Daring, PA-C  [facility]

## 2019-04-10 ENCOUNTER — Encounter: Payer: Self-pay | Admitting: Physician Assistant

## 2019-04-10 ENCOUNTER — Ambulatory Visit (INDEPENDENT_AMBULATORY_CARE_PROVIDER_SITE_OTHER): Payer: PPO | Admitting: Physician Assistant

## 2019-04-10 ENCOUNTER — Other Ambulatory Visit: Payer: Self-pay

## 2019-04-10 VITALS — BP 148/76 | HR 70 | Temp 97.1°F | Resp 16 | Wt 153.8 lb

## 2019-04-10 DIAGNOSIS — R1084 Generalized abdominal pain: Secondary | ICD-10-CM | POA: Diagnosis not present

## 2019-04-10 NOTE — Patient Instructions (Signed)
Abdominal Bloating When you have abdominal bloating, your abdomen may feel full, tight, or painful. It may also look bigger than normal or swollen (distended). Common causes of abdominal bloating include:  Swallowing air.  Constipation.  Problems digesting food.  Eating too much.  Irritable bowel syndrome. This is a condition that affects the large intestine.  Lactose intolerance. This is an inability to digest lactose, a natural sugar in dairy products.  Celiac disease. This is a condition that affects the ability to digest gluten, a protein found in some grains.  Gastroparesis. This is a condition that slows down the movement of food in the stomach and small intestine. It is more common in people with diabetes mellitus.  Gastroesophageal reflux disease (GERD). This is a digestive condition that makes stomach acid flow back into the esophagus.  Urinary retention. This means that the body is holding onto urine, and the bladder cannot be emptied all the way. Follow these instructions at home: Eating and drinking  Avoid eating too much.  Try not to swallow air while talking or eating.  Avoid eating while lying down.  Avoid these foods and drinks: ? Foods that cause gas, such as broccoli, cabbage, cauliflower, and baked beans. ? Carbonated drinks. ? Hard candy. ? Chewing gum. Medicines  Take over-the-counter and prescription medicines only as told by your health care provider.  Take probiotic medicines. These medicines contain live bacteria or yeasts that can help digestion.  Take coated peppermint oil capsules. Activity  Try to exercise regularly. Exercise may help to relieve bloating that is caused by gas and relieve constipation. General instructions  Keep all follow-up visits as told by your health care provider. This is important. Contact a health care provider if:  You have nausea and vomiting.  You have diarrhea.  You have abdominal pain.  You have unusual  weight loss or weight gain.  You have severe pain, and medicines do not help. Get help right away if:  You have severe chest pain.  You have trouble breathing.  You have shortness of breath.  You have trouble urinating.  You have darker urine than normal.  You have blood in your stools or have dark, tarry stools. Summary  Abdominal bloating means that the abdomen is swollen.  Common causes of abdominal bloating are swallowing air, constipation, and problems digesting food.  Avoid eating too much and avoid swallowing air.  Avoid foods that cause gas, carbonated drinks, hard candy, and chewing gum. This information is not intended to replace advice given to you by your health care provider. Make sure you discuss any questions you have with your health care provider. Document Released: 06/03/2016 Document Revised: 08/20/2018 Document Reviewed: 06/03/2016 Elsevier Patient Education  2020 [employer]  

## 2019-04-11 LAB — CBC WITH DIFFERENTIAL/PLATELET
Basophils Absolute: 0 10*3/uL (ref 0.0–0.2)
Basos: 1 %
EOS (ABSOLUTE): 0.1 10*3/uL (ref 0.0–0.4)
Eos: 1 %
Hematocrit: 43.2 % (ref 34.0–46.6)
Hemoglobin: 14.6 g/dL (ref 11.1–15.9)
Immature Grans (Abs): 0 10*3/uL (ref 0.0–0.1)
Immature Granulocytes: 0 %
Lymphocytes Absolute: 1.6 10*3/uL (ref 0.7–3.1)
Lymphs: 29 %
MCH: 31.4 pg (ref 26.6–33.0)
MCHC: 33.8 g/dL (ref 31.5–35.7)
MCV: 93 fL (ref 79–97)
Monocytes Absolute: 0.4 10*3/uL (ref 0.1–0.9)
Monocytes: 7 %
Neutrophils Absolute: 3.5 10*3/uL (ref 1.4–7.0)
Neutrophils: 62 %
Platelets: 236 10*3/uL (ref 150–450)
RBC: 4.65 x10E6/uL (ref 3.77–5.28)
RDW: 12.2 % (ref 11.7–15.4)
WBC: 5.6 10*3/uL (ref 3.4–10.8)

## 2019-04-11 LAB — COMPREHENSIVE METABOLIC PANEL
ALT: 12 IU/L (ref 0–32)
AST: 17 IU/L (ref 0–40)
Albumin/Globulin Ratio: 2.2 (ref 1.2–2.2)
Albumin: 4.6 g/dL (ref 3.6–4.6)
Alkaline Phosphatase: 72 IU/L (ref 39–117)
BUN/Creatinine Ratio: 12 (ref 12–28)
BUN: 11 mg/dL (ref 8–27)
Bilirubin Total: 0.5 mg/dL (ref 0.0–1.2)
CO2: 19 mmol/L — ABNORMAL LOW (ref 20–29)
Calcium: 9.9 mg/dL (ref 8.7–10.3)
Chloride: 101 mmol/L (ref 96–106)
Creatinine, Ser: 0.9 mg/dL (ref 0.57–1.00)
GFR calc Af Amer: 66 mL/min/{1.73_m2} (ref >59)
GFR calc non Af Amer: 57 mL/min/{1.73_m2} — ABNORMAL LOW (ref >59)
Globulin, Total: 2.1 g/dL (ref 1.5–4.5)
Glucose: 108 mg/dL — ABNORMAL HIGH (ref 65–99)
Potassium: 4.7 mmol/L (ref 3.5–5.2)
Sodium: 138 mmol/L (ref 134–144)
Total Protein: 6.7 g/dL (ref 6.0–8.5)

## 2019-04-11 LAB — LIPASE: Lipase: 15 U/L (ref 14–85)

## 2019-04-15 ENCOUNTER — Telehealth: Payer: Self-pay | Admitting: Family Medicine

## 2019-04-15 ENCOUNTER — Telehealth: Payer: Self-pay

## 2019-04-15 NOTE — Telephone Encounter (Signed)
Pt given lab results per notes of jennifer Burnette PA on 04/15/2019. Pt verbalized understanding.

## 2019-04-15 NOTE — Telephone Encounter (Signed)
LMTCB Ok for [facility] to give results.

## 2019-04-15 NOTE — Telephone Encounter (Signed)
-----   Message from Mar Daring, PA-C sent at 04/15/2019  8:40 AM EST ----- All labs are normal and stable. No increase in WBC indicating infection. Kidney and liver enzymes are normal. Lipase (pancreatic enzyme) is also normal. Hopefully symptoms have still not returned.

## 2019-04-15 NOTE — Telephone Encounter (Signed)
From [name] 

## 2019-08-23 DIAGNOSIS — M5116 Intervertebral disc disorders with radiculopathy, lumbar region: Secondary | ICD-10-CM | POA: Diagnosis not present

## 2019-08-23 DIAGNOSIS — R262 Difficulty in walking, not elsewhere classified: Secondary | ICD-10-CM | POA: Diagnosis not present

## 2019-08-23 DIAGNOSIS — M545 Low back pain: Secondary | ICD-10-CM | POA: Diagnosis not present

## 2019-08-27 DIAGNOSIS — M5116 Intervertebral disc disorders with radiculopathy, lumbar region: Secondary | ICD-10-CM | POA: Diagnosis not present

## 2019-08-27 DIAGNOSIS — R262 Difficulty in walking, not elsewhere classified: Secondary | ICD-10-CM | POA: Diagnosis not present

## 2019-08-27 DIAGNOSIS — M545 Low back pain: Secondary | ICD-10-CM | POA: Diagnosis not present

## 2019-08-30 DIAGNOSIS — M5116 Intervertebral disc disorders with radiculopathy, lumbar region: Secondary | ICD-10-CM | POA: Diagnosis not present

## 2019-08-30 DIAGNOSIS — M545 Low back pain: Secondary | ICD-10-CM | POA: Diagnosis not present

## 2019-08-30 DIAGNOSIS — R262 Difficulty in walking, not elsewhere classified: Secondary | ICD-10-CM | POA: Diagnosis not present

## 2019-09-02 DIAGNOSIS — M5116 Intervertebral disc disorders with radiculopathy, lumbar region: Secondary | ICD-10-CM | POA: Diagnosis not present

## 2019-09-02 DIAGNOSIS — M545 Low back pain: Secondary | ICD-10-CM | POA: Diagnosis not present

## 2019-09-02 DIAGNOSIS — R262 Difficulty in walking, not elsewhere classified: Secondary | ICD-10-CM | POA: Diagnosis not present

## 2019-09-02 NOTE — Progress Notes (Signed)
Established patient visit   I,Atiya M Cummings,acting as a scribe for [name], MD.,have documented all relevant documentation on the behalf of [name], MD,as directed by  [name], MD while in the presence of [name], MD.  Patient: Deanna Williams   DOB: [birth year]   86 y.o. Female  MRN: [id number] Visit Date: 09/05/2019  Today's healthcare provider: [name], MD   Chief Complaint  Patient presents with  . Leg Pain   Subjective    Leg Pain  The incident occurred more than 1 week ago (3 weeks ago). The incident occurred in the street. The injury mechanism was a fall. The pain is present in the right leg. The quality of the pain is described as stabbing. The pain is at a severity of 5/10. The pain has been fluctuating since onset. Associated symptoms include numbness and tingling. Pertinent negatives include no inability to bear weight. Associated symptoms comments: In calf . She reports no foreign bodies present. The symptoms are aggravated by weight bearing. She has tried nothing (sees therapist) for the symptoms.  Patient suffered a mechanical fall in the street while walking her dog on March 21 of this year.  She is seeing physical therapy and is some better.  She is tenderness in certain parts of her back and leg where she fell. Has knot under left foot.       Medications: Outpatient Medications Prior to Visit  Medication Sig  . Calcium Carb-Cholecalciferol (CALCIUM 1000 + D) 1000-800 MG-UNIT TABS Take by mouth daily.  . Omega-3 Fatty Acids (FISH OIL) 1000 MG CPDR Take by mouth.  . terconazole (TERAZOL 3) 0.8 % vaginal cream Place 1 applicator vaginally at bedtime.  . clobetasol ointment (TEMOVATE) [id number] % Apply 1 application topically 2 (two) times daily. (Patient not taking: Reported on 09/05/2019)   No facility-administered medications prior to visit.    Review of Systems  Constitutional: Negative.   HENT: Negative.    Eyes: Negative.   Respiratory: Negative.   Cardiovascular: Negative.   Gastrointestinal: Negative.   Endocrine: Negative.   Genitourinary: Negative.   Musculoskeletal: Positive for arthralgias and back pain.  Allergic/Immunologic: Negative.   Neurological: Positive for tingling and numbness.  Hematological: Negative.   Psychiatric/Behavioral: Negative.         Objective    BP 134/78 (BP Location: Right Arm, Patient Position: Sitting, Cuff Size: Normal)   Pulse 71   Temp (!) 96.9 F (36.1 C) (Temporal)   Wt 155 lb 9.6 oz (70.6 kg)   SpO2 97%   BMI 27.56 kg/m  BP Readings from Last 3 Encounters:  09/05/19 134/78  04/10/19 (!) 148/76  08/02/18 120/70   Wt Readings from Last 3 Encounters:  09/05/19 155 lb 9.6 oz (70.6 kg)  04/10/19 153 lb 12.8 oz (69.8 kg)  08/02/18 159 lb 3.2 oz (72.2 kg)      Physical Exam Vitals reviewed.  Constitutional:      Appearance: Normal appearance. She is well-developed.  HENT:     Head: Normocephalic and atraumatic.     Right Ear: Tympanic membrane and external ear normal.     Left Ear: Tympanic membrane and external ear normal.     Nose: Nose normal.  Eyes:     General: No scleral icterus.    Conjunctiva/sclera: Conjunctivae normal.     Pupils: Pupils are equal, round, and reactive to light.  Neck:     Thyroid: No thyromegaly.  Cardiovascular:  Rate and Rhythm: Normal rate and regular rhythm.     Heart sounds: Normal heart sounds.  Pulmonary:     Effort: Pulmonary effort is normal.     Breath sounds: Normal breath sounds.  Abdominal:     Palpations: Abdomen is soft.  Musculoskeletal:     Right lower leg: No edema.     Left lower leg: No edema.     Comments: She has mild tenderness over the SI area and the greater trochanter.  She is able to open bear weight without pain. She has what appears to be a slight scarring in the mid plantar fascia.  There is no tenderness and no loss of function.  Lymphadenopathy:     Cervical:  No cervical adenopathy.  Skin:    General: Skin is warm and dry.  Neurological:     General: No focal deficit present.     Mental Status: She is alert and oriented to person, place, and time. Mental status is at baseline.  Psychiatric:        Mood and Affect: Mood normal.        Behavior: Behavior normal.        Thought Content: Thought content normal.        Judgment: Judgment normal.       No results found for any visits on 09/05/19.   Assessment & Plan    1. Back pain, unspecified back location, unspecified back pain laterality, unspecified chronicity I think all of her pain is from a mechanical fall.  No evidence at all of any fractures.  No imaging at this time.  Continue physical therapy - DG Lumbar Spine Complete  2. Trochanteric bursitis, unspecified laterality I think this is due to direct trauma from the fall.  The tenderness is minimal.  No imaging at this time.  Patient is in agreement completely.  3. Essential hypertension Controlled.  4. Age-related osteoporosis without current pathological fracture This places her at risk of fracture going forward but I do not think she is has any osteoporotic fractures from this fall.  5. Hyperglycemia Follow-up labs in the near future  6. Hypercholesterolemia    No follow-ups on file.         Nuh Lipton Cranford Mon, MD  [facility] [phone] (phone) [phone] (fax)  [place]

## 2019-09-04 DIAGNOSIS — R262 Difficulty in walking, not elsewhere classified: Secondary | ICD-10-CM | POA: Diagnosis not present

## 2019-09-04 DIAGNOSIS — M545 Low back pain: Secondary | ICD-10-CM | POA: Diagnosis not present

## 2019-09-04 DIAGNOSIS — M5116 Intervertebral disc disorders with radiculopathy, lumbar region: Secondary | ICD-10-CM | POA: Diagnosis not present

## 2019-09-05 ENCOUNTER — Ambulatory Visit
Admission: RE | Admit: 2019-09-05 | Discharge: 2019-09-05 | Disposition: A | Discharge status: Home / Self Care | Payer: PPO | Source: Ambulatory Visit | Attending: Family Medicine | Admitting: Family Medicine

## 2019-09-05 ENCOUNTER — Other Ambulatory Visit: Payer: Self-pay

## 2019-09-05 ENCOUNTER — Ambulatory Visit (INDEPENDENT_AMBULATORY_CARE_PROVIDER_SITE_OTHER): Payer: PPO | Admitting: Family Medicine

## 2019-09-05 ENCOUNTER — Encounter: Payer: Self-pay | Admitting: Family Medicine

## 2019-09-05 VITALS — BP 134/78 | HR 71 | Temp 96.9°F | Wt 155.6 lb

## 2019-09-05 DIAGNOSIS — M810 Age-related osteoporosis without current pathological fracture: Secondary | ICD-10-CM

## 2019-09-05 DIAGNOSIS — I10 Essential (primary) hypertension: Secondary | ICD-10-CM

## 2019-09-05 DIAGNOSIS — R739 Hyperglycemia, unspecified: Secondary | ICD-10-CM | POA: Diagnosis not present

## 2019-09-05 DIAGNOSIS — M549 Dorsalgia, unspecified: Secondary | ICD-10-CM | POA: Diagnosis not present

## 2019-09-05 DIAGNOSIS — M7060 Trochanteric bursitis, unspecified hip: Secondary | ICD-10-CM

## 2019-09-05 DIAGNOSIS — E7800 Pure hypercholesterolemia, unspecified: Secondary | ICD-10-CM | POA: Diagnosis not present

## 2019-09-05 DIAGNOSIS — M545 Low back pain: Secondary | ICD-10-CM | POA: Diagnosis not present

## 2019-09-09 DIAGNOSIS — M5116 Intervertebral disc disorders with radiculopathy, lumbar region: Secondary | ICD-10-CM | POA: Diagnosis not present

## 2019-09-09 DIAGNOSIS — M545 Low back pain: Secondary | ICD-10-CM | POA: Diagnosis not present

## 2019-09-09 DIAGNOSIS — R262 Difficulty in walking, not elsewhere classified: Secondary | ICD-10-CM | POA: Diagnosis not present

## 2019-09-09 DIAGNOSIS — S63511D Sprain of carpal joint of right wrist, subsequent encounter: Secondary | ICD-10-CM | POA: Diagnosis not present

## 2019-09-11 ENCOUNTER — Telehealth: Payer: Self-pay | Admitting: *Deleted

## 2019-09-11 NOTE — Telephone Encounter (Addendum)
LMOVM pt to return call. Okay for [facility] triage to give pt results.

## 2019-09-11 NOTE — Telephone Encounter (Signed)
-----   Message from Jerrol Banana., MD sent at 09/11/2019  7:48 AM EDT ----- [place] shows degenerative disc disease and some thinning out of the bones.  Old compression fractures noted.  No patient has follow-up in a few months but if she is still having significant pain I like to see her in the next few weeks.

## 2019-09-11 NOTE — Telephone Encounter (Signed)
Reviewed xray results and physician's note with the patient. Patient stated she is working with her local PT and the pain is improving for now.

## 2019-09-13 DIAGNOSIS — R262 Difficulty in walking, not elsewhere classified: Secondary | ICD-10-CM | POA: Diagnosis not present

## 2019-09-13 DIAGNOSIS — M545 Low back pain: Secondary | ICD-10-CM | POA: Diagnosis not present

## 2019-09-13 DIAGNOSIS — M5116 Intervertebral disc disorders with radiculopathy, lumbar region: Secondary | ICD-10-CM | POA: Diagnosis not present

## 2019-09-17 DIAGNOSIS — M5116 Intervertebral disc disorders with radiculopathy, lumbar region: Secondary | ICD-10-CM | POA: Diagnosis not present

## 2019-09-17 DIAGNOSIS — R262 Difficulty in walking, not elsewhere classified: Secondary | ICD-10-CM | POA: Diagnosis not present

## 2019-09-17 DIAGNOSIS — M545 Low back pain: Secondary | ICD-10-CM | POA: Diagnosis not present

## 2019-09-17 NOTE — Telephone Encounter (Signed)
Patient was advised of results 09/11/2019. But not documented in note.

## 2019-09-20 DIAGNOSIS — R262 Difficulty in walking, not elsewhere classified: Secondary | ICD-10-CM | POA: Diagnosis not present

## 2019-09-20 DIAGNOSIS — M5116 Intervertebral disc disorders with radiculopathy, lumbar region: Secondary | ICD-10-CM | POA: Diagnosis not present

## 2019-09-20 DIAGNOSIS — M545 Low back pain: Secondary | ICD-10-CM | POA: Diagnosis not present

## 2019-09-27 DIAGNOSIS — M5116 Intervertebral disc disorders with radiculopathy, lumbar region: Secondary | ICD-10-CM | POA: Diagnosis not present

## 2019-09-27 DIAGNOSIS — M545 Low back pain: Secondary | ICD-10-CM | POA: Diagnosis not present

## 2019-09-27 DIAGNOSIS — R262 Difficulty in walking, not elsewhere classified: Secondary | ICD-10-CM | POA: Diagnosis not present

## 2019-09-27 DIAGNOSIS — S63511D Sprain of carpal joint of right wrist, subsequent encounter: Secondary | ICD-10-CM | POA: Diagnosis not present

## 2019-09-30 DIAGNOSIS — S63511D Sprain of carpal joint of right wrist, subsequent encounter: Secondary | ICD-10-CM | POA: Diagnosis not present

## 2019-10-03 DIAGNOSIS — S63511D Sprain of carpal joint of right wrist, subsequent encounter: Secondary | ICD-10-CM | POA: Diagnosis not present

## 2019-10-03 DIAGNOSIS — R262 Difficulty in walking, not elsewhere classified: Secondary | ICD-10-CM | POA: Diagnosis not present

## 2019-10-03 DIAGNOSIS — M545 Low back pain: Secondary | ICD-10-CM | POA: Diagnosis not present

## 2019-10-03 DIAGNOSIS — M5116 Intervertebral disc disorders with radiculopathy, lumbar region: Secondary | ICD-10-CM | POA: Diagnosis not present

## 2019-10-07 DIAGNOSIS — M545 Low back pain: Secondary | ICD-10-CM | POA: Diagnosis not present

## 2019-10-07 DIAGNOSIS — M5116 Intervertebral disc disorders with radiculopathy, lumbar region: Secondary | ICD-10-CM | POA: Diagnosis not present

## 2019-10-07 DIAGNOSIS — R262 Difficulty in walking, not elsewhere classified: Secondary | ICD-10-CM | POA: Diagnosis not present

## 2019-10-10 DIAGNOSIS — R262 Difficulty in walking, not elsewhere classified: Secondary | ICD-10-CM | POA: Diagnosis not present

## 2019-10-10 DIAGNOSIS — M5116 Intervertebral disc disorders with radiculopathy, lumbar region: Secondary | ICD-10-CM | POA: Diagnosis not present

## 2019-10-10 DIAGNOSIS — M545 Low back pain: Secondary | ICD-10-CM | POA: Diagnosis not present

## 2019-11-19 ENCOUNTER — Ambulatory Visit (INDEPENDENT_AMBULATORY_CARE_PROVIDER_SITE_OTHER): Payer: PPO | Admitting: Family Medicine

## 2019-11-19 ENCOUNTER — Encounter: Payer: Self-pay | Admitting: Family Medicine

## 2019-11-19 DIAGNOSIS — J0100 Acute maxillary sinusitis, unspecified: Secondary | ICD-10-CM

## 2019-11-19 MED ORDER — AMOXICILLIN 875 MG PO TABS: 875.0000 mg | ORAL_TABLET | Freq: Two times a day (BID) | ORAL | 0 refills | Status: DC

## 2019-11-19 NOTE — Progress Notes (Signed)
Virtual telephone visit    Virtual Visit via Telephone Note   This visit type was conducted due to national recommendations for restrictions regarding the COVID-19 Pandemic (e.g. social distancing) in an effort to limit this patient's exposure and mitigate transmission in our community. Due to her co-morbid illnesses, this patient is at least at moderate risk for complications without adequate follow up. This format is felt to be most appropriate for this patient at this time. The patient did not have access to video technology or had technical difficulties with video requiring transitioning to audio format only (telephone). Physical exam was limited to content and character of the telephone converstion.    Patient location: Home Provider location: Office   Visit Date: 11/19/2019  Today's healthcare provider: Vernie Murders, PA   Chief Complaint  Patient presents with  . Sinus Problem   Subjective    Sinus Problem This is a new problem. The current episode started in the past 7 days. The problem has been gradually improving since onset. There has been no fever. She is experiencing no pain. Associated symptoms include coughing, headaches, a hoarse voice, shortness of breath, sinus pressure and sneezing. Pertinent negatives include no chills, congestion, diaphoresis, ear pain, neck pain, sore throat or swollen glands. Treatments tried: Benadryl. The treatment provided no relief.      Past Medical History:  Diagnosis Date  . Gastroesophageal reflux disease   . Hyperlipidemia   . Hypertension   . Lichen sclerosus   . Lichen sclerosus et atrophicus   . Osteopenia   . Palpitations    Past Surgical History:  Procedure Laterality Date  . CARPAL TUNNEL RELEASE    . FOOT SURGERY    . HYSTEROSCOPY WITH D & C N/A 11/02/2015   Procedure: DILATATION AND CURETTAGE /HYSTEROSCOPY;  Surgeon: Brayton Mars, MD;  Location: [facility] ORS;  Service: Gynecology;  Laterality: N/A;    Social History   Tobacco Use  . Smoking status: Never Smoker  . Smokeless tobacco: Never Used  Vaping Use  . Vaping Use: Never used  Substance Use Topics  . Alcohol use: Yes    Alcohol/week: 0.0 standard drinks    Comment: rare- wine  . Drug use: No   Family Status  Relation Name Status  . Mother  Deceased at age 38  . Father  Deceased at age 86  . Sister  Deceased  . Brother  Alive  . Neg Hx  (Not Specified)   No Known Allergies    Medications: Outpatient Medications Prior to Visit  Medication Sig  . Calcium Carb-Cholecalciferol (CALCIUM 1000 + D) 1000-800 MG-UNIT TABS Take by mouth daily.  . Omega-3 Fatty Acids (FISH OIL) 1000 MG CPDR Take by mouth.  . terconazole (TERAZOL 3) 0.8 % vaginal cream Place 1 applicator vaginally at bedtime.  . clobetasol ointment (TEMOVATE) 2.13 % Apply 1 application topically 2 (two) times daily. (Patient not taking: Reported on 09/05/2019)   No facility-administered medications prior to visit.    Review of Systems  Constitutional: Negative for chills and diaphoresis.  HENT: Positive for hoarse voice, sinus pressure and sneezing. Negative for congestion, ear pain and sore throat.   Respiratory: Positive for cough and shortness of breath.   Musculoskeletal: Negative for neck pain.  Neurological: Positive for headaches.    Objective    There were no vitals taken for this visit.   Physical Exam: Slightly hoarse voice without acute respiratory distress during telephonic interview.    Assessment & Plan  1. Subacute maxillary sinusitis Developed pressure headache under and around eyes with scratchy throat, PND, occasional sneezing and occasional cough with yellow sputum over the past 2 days. Had he COVID vaccinations in January and February 2021. Tried Benadryl but it caused too much sleepiness. Recommend using Claritin for allergy symptoms and Flonase Nasal spray. Add Amoxil for sinus infection and increase fluid intake. Recheck if  any fever and follow up prn. - amoxicillin (AMOXIL) 875 MG tablet; Take 1 tablet (875 mg total) by mouth 2 (two) times daily.  Dispense: 20 tablet; Refill: 0     No follow-ups on file.    I discussed the assessment and treatment plan with the patient. The patient was provided an opportunity to ask questions and all were answered. The patient agreed with the plan and demonstrated an understanding of the instructions.   The patient was advised to call back or seek an in-person evaluation if the symptoms worsen or if the condition fails to improve as anticipated.  I provided 15 minutes of non-face-to-face time during this encounter.  Andres Shad, PA, have reviewed all documentation for this visit. The documentation on 11/19/19 for the exam, diagnosis, procedures, and orders are all accurate and complete.   Vernie Murders, [place] [phone] (phone) [phone] (fax)  [place]

## 2019-12-10 DIAGNOSIS — H2513 Age-related nuclear cataract, bilateral: Secondary | ICD-10-CM | POA: Diagnosis not present

## 2020-01-01 ENCOUNTER — Telehealth: Payer: Self-pay | Admitting: Family Medicine

## 2020-01-01 NOTE — Telephone Encounter (Signed)
Copied from [place] [id number]. Topic: Medicare AWV >> Jan 01, 2020  9:52 AM Cher Nakai R wrote: Reason for CRM:  Left message for patient to call back and schedule Medicare Annual Wellness Visit (AWV) either virtually or in office.  Last AWV 10/18/2017  Please schedule at anytime with [facility] Health Advisor.  If any questions, please contact me at [phone]

## 2020-01-02 ENCOUNTER — Telehealth: Payer: Self-pay

## 2020-01-02 NOTE — Telephone Encounter (Signed)
Copied from [place] [phone]. Topic: Medicare AWV >> Jan 01, 2020  9:52 AM Cher Nakai R wrote: Reason for CRM:  Left message for patient to call back and schedule Medicare Annual Wellness Visit (AWV) either virtually or in office.  Last AWV 10/18/2017  Please schedule at anytime with [facility] Health Advisor.  If any questions, please contact me at [phone] >> Jan 01, 2020  1:54 PM Oneta Rack wrote: Patient returned call and would like to schedule her AWV around the same time she is seeing PCP on 9/7, offered patient 1:20pm, patient declined and states shes not interested.

## 2020-01-02 NOTE — Telephone Encounter (Signed)
Copied from [place] [phone]. Topic: Medicare AWV >> Jan 01, 2020  9:52 AM Cher Nakai R wrote: Reason for CRM:  Left message for patient to call back and schedule Medicare Annual Wellness Visit (AWV) either virtually or in office.  Last AWV 10/18/2017  Please schedule at anytime with [facility] Health Advisor.  If any questions, please contact me at [phone] >> Jan 01, 2020  1:54 PM Oneta Rack wrote: Patient returned call and would like to schedule her AWV around the same time she is seeing PCP on 9/7, offered patient 1:20pm, patient declined and states shes not interested.

## 2020-01-02 NOTE — Telephone Encounter (Signed)
Copied from [place] [phone]. Topic: Medicare AWV >> Jan 01, 2020  9:52 AM Cher Nakai R wrote: Reason for CRM:  Left message for patient to call back and schedule Medicare Annual Wellness Visit (AWV) either virtually or in office.  Last AWV 10/18/2017  Please schedule at anytime with [facility] Health Advisor.  If any questions, please contact me at [phone] >> Jan 01, 2020  1:54 PM Oneta Rack wrote: Patient returned call and would like to schedule her AWV around the same time she is seeing PCP on 9/7, offered patient 1:20pm, patient declined and states shes not interested.

## 2020-01-16 NOTE — Progress Notes (Signed)
Annual Wellness Visit     I,Deanna Williams,acting as a scribe for [name], MD.,have documented all relevant documentation on the behalf of [name], MD,as directed by  [name], MD while in the presence of [name], MD.  Patient: Deanna Williams, Female    DOB: [birth year], 86 y.o.   MRN: [id number] Visit Date: 01/21/2020  Today's Provider: [name], MD   Chief Complaint  Patient presents with  . Medicare Wellness   Subjective    Deanna Williams is a 86 y.o. female who presents today for her Annual Wellness Visit. She reports consuming a general diet.  She generally feels well. She reports sleeping well. She does not have additional problems to discuss today.  She does have some progressive urinary stress incontinence. She has not had COVID but has had the vaccine.  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 She has frequent urination.  Cough seems to be worse recently with a dry mouth.  Her feet tingle and at the end of the day they swell and feel tight.  Patient Active Problem List   Diagnosis Date Noted  . Hyperglycemia 08/31/2016  . Endometrial polyp 11/11/2015  . Lichen sclerosus 49/44/9675  . Osteoporosis 08/11/2015  . Allergic rhinitis 04/14/2015  . Blood pressure elevated 04/14/2015  . 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 Acid reflux 04/14/2015  . 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 Hypercholesterolemia 04/14/2015  . BP (high blood pressure) 04/14/2015  . Arthritis, degenerative 04/14/2015  . Osteopenia 04/14/2015  . Awareness of heartbeats 04/14/2015   Social History   Socioeconomic History  . Marital status: Widowed    Spouse name: Not on file  . Number of children: 1  . Years of education: Not on file  . Highest education level: 11th grade  Occupational History  . Not on file  Tobacco Use  . Smoking status: Never Smoker  . Smokeless tobacco: Never Used  Vaping Use  . Vaping Use: Never used  Substance  and Sexual Activity  . Alcohol use: Yes    Alcohol/week: 0.0 standard drinks    Comment: rare- wine  . Drug use: No  . Sexual activity: Never  Other Topics Concern  . Not on file  Social History Narrative  . Not on file   Social Determinants of Health   Financial Resource Strain:   . Difficulty of Paying Living Expenses: Not on file  Food Insecurity:   . Worried About Charity fundraiser in the Last Year: Not on file  . Ran Out of Food in the Last Year: Not on file  Transportation Needs:   . Lack of Transportation (Medical): Not on file  . Lack of Transportation (Non-Medical): Not on file  Physical Activity:   . Days of Exercise per Week: Not on file  . Minutes of Exercise per Session: Not on file  Stress:   . Feeling of Stress : Not on file  Social Connections:   . Frequency of Communication with Friends and Family: Not on file  . Frequency of Social Gatherings with Friends and Family: Not on file  . Attends Religious Services: Not on file  . Active Member of Clubs or Organizations: Not on file  . Attends Archivist Meetings: Not on file  . Marital Status: Not on file  Intimate Partner Violence:   . Fear of Current or Ex-Partner: Not on file  . Emotionally Abused: Not on file  .  Physically Abused: Not on file  . Sexually Abused: Not on file   Family Status  Relation Name Status  . Mother  Deceased at age 76  . Father  Deceased at age 51  . Sister  Deceased  . Brother  Alive  . Neg Hx  (Not Specified)   Family History  Problem Relation Age of Onset  . Cancer Mother   . Leukemia Mother   . Cancer Father        colon  . Hypertension Father   . Arthritis Sister   . Breast cancer Neg Hx    No Known Allergies     Medications: Outpatient Medications Prior to Visit  Medication Sig  . clobetasol ointment (TEMOVATE) 1.95 % Apply 1 application topically 2 (two) times daily.  . Omega-3 Fatty Acids (FISH OIL) 1000 MG CPDR Take by mouth.  . Vitamin D,  Cholecalciferol, 50 MCG (2000 UT) CAPS Take by mouth.  . [DISCONTINUED] terconazole (TERAZOL 3) 0.8 % vaginal cream Place 1 applicator vaginally at bedtime.  . [DISCONTINUED] amoxicillin (AMOXIL) 875 MG tablet Take 1 tablet (875 mg total) by mouth 2 (two) times daily.  . [DISCONTINUED] Calcium Carb-Cholecalciferol (CALCIUM 1000 + D) 1000-800 MG-UNIT TABS Take by mouth daily.   No facility-administered medications prior to visit.    No Known Allergies  Patient Care Team: Jerrol Banana., MD as PCP - General (Family Medicine) Estill Cotta, MD as Consulting Physician (Ophthalmology) Ralene Bathe, MD as Consulting Physician (Dermatology)  Review of Systems  Constitutional: Positive for fatigue.  HENT: Positive for drooling.   Eyes: Negative.   Respiratory: Positive for cough, chest tightness and shortness of breath.   Cardiovascular: Positive for chest pain and leg swelling.  Gastrointestinal: Negative.   Endocrine: Positive for polyuria.  Genitourinary: Positive for vaginal pain.  Musculoskeletal: Positive for arthralgias, back pain and myalgias.  Skin: Negative.   Allergic/Immunologic: Negative.   Neurological: Positive for weakness, numbness and headaches.  Hematological: Negative.   Psychiatric/Behavioral: The patient is nervous/anxious.     Last CBC Lab Results  Component Value Date   WBC 5.6 04/10/2019   HGB 14.6 04/10/2019   HCT 43.2 04/10/2019   MCV 93 04/10/2019   MCH 31.4 04/10/2019   RDW 12.2 04/10/2019   PLT 236 09/32/6712   Last metabolic panel Lab Results  Component Value Date   GLUCOSE 108 (H) 04/10/2019   NA 138 04/10/2019   K 4.7 04/10/2019   CL 101 04/10/2019   CO2 19 (L) 04/10/2019   BUN 11 04/10/2019   CREATININE 0.90 04/10/2019   GFRNONAA 57 (L) 04/10/2019   GFRAA 66 04/10/2019   CALCIUM 9.9 04/10/2019   PROT 6.7 04/10/2019   ALBUMIN 4.6 04/10/2019   LABGLOB 2.1 04/10/2019   AGRATIO 2.2 04/10/2019   BILITOT 0.5 04/10/2019    ALKPHOS 72 04/10/2019   AST 17 04/10/2019   ALT 12 04/10/2019   ANIONGAP 8 10/27/2015   Last lipids Lab Results  Component Value Date   CHOL 190 07/30/2015   HDL 79 07/30/2015   LDLCALC 98 07/30/2015   TRIG 66 07/30/2015   Last hemoglobin A1c Lab Results  Component Value Date   HGBA1C 6.1 (A) 05/02/2018   Last thyroid functions Lab Results  Component Value Date   TSH 3.390 07/19/2018      Objective    Vitals: There were no vitals taken for this visit. BP Readings from Last 3 Encounters:  09/05/19 134/78  04/10/19 (!) 148/76  08/02/18 120/70   Wt Readings from Last 3 Encounters:  09/05/19 155 lb 9.6 oz (70.6 kg)  04/10/19 153 lb 12.8 oz (69.8 kg)  08/02/18 159 lb 3.2 oz (72.2 kg)      Physical Exam Vitals reviewed.  Constitutional:      Appearance: She is well-developed.  HENT:     Head: Normocephalic and atraumatic.     Right Ear: External ear normal.     Left Ear: External ear normal.     Nose: Nose normal.  Eyes:     General: No scleral icterus.    Conjunctiva/sclera: Conjunctivae normal.     Pupils: Pupils are equal, round, and reactive to light.  Neck:     Thyroid: No thyromegaly.  Cardiovascular:     Rate and Rhythm: Normal rate and regular rhythm.     Heart sounds: Normal heart sounds.  Pulmonary:     Effort: Pulmonary effort is normal.     Breath sounds: Normal breath sounds.  Abdominal:     Palpations: Abdomen is soft.  Lymphadenopathy:     Cervical: No cervical adenopathy.  Skin:    General: Skin is warm and dry.  Neurological:     Mental Status: She is alert and oriented to person, place, and time.  Psychiatric:        Behavior: Behavior normal.        Thought Content: Thought content normal.      Most recent functional status assessment: In your present state of health, do you have any difficulty performing the following activities: 01/21/2020  Hearing? N  Vision? N  Difficulty concentrating or making decisions? Y  Walking or  climbing stairs? Y  Dressing or bathing? N  Doing errands, shopping? N  Some recent data might be hidden   Most recent fall risk assessment: Fall Risk  01/21/2020  Falls in the past year? 1  Number falls in past yr: 0  Injury with Fall? 1  Risk for fall due to : Impaired balance/gait  Follow up Falls evaluation completed    Most recent depression screenings: PHQ 2/9 Scores 01/21/2020 08/02/2018  PHQ - 2 Score 3 0  PHQ- 9 Score 9 -   Most recent cognitive screening: 6CIT Screen 01/21/2020  What Year? 0 points  What month? 0 points  What time? 0 points  Count back from 20 0 points  Months in reverse 0 points  Repeat phrase 4 points  Total Score 4   Most recent Audit-C alcohol use screening Alcohol Use Disorder Test (AUDIT) 01/21/2020  1. How often do you have a drink containing alcohol? 1  2. How many drinks containing alcohol do you have on a typical day when you are drinking? 0  3. How often do you have six or more drinks on one occasion? 0  AUDIT-C Score 1  Alcohol Brief Interventions/Follow-up AUDIT Score <7 follow-up not indicated   A score of 3 or more in women, and 4 or more in men indicates increased risk for alcohol abuse, EXCEPT if all of the points are from question 1   No results found for any visits on 01/21/20.  Assessment & Plan     Annual wellness visit done today including the all of the following: Reviewed patient's Family Medical History Reviewed and updated list of patient's medical providers Assessment of cognitive impairment was done Assessed patient's functional ability Established a written schedule for health screening [place] Completed and Reviewed  Exercise Activities and Dietary recommendations  Goals    . DIET - INCREASE WATER INTAKE     Recommend increasing water intake 3 glasses of water a day.     . Increase water intake     Starting 05/26/16, I will increase my water intake to 4 glasses daily.        Immunization  History  Administered Date(s) Administered  . Influenza, High Dose Seasonal PF 02/11/2016, 03/07/2017, 02/01/2018, 02/26/2019  . Influenza-Unspecified 02/13/2014  . PFIZER SARS-COV-2 Vaccination 05/27/2019, 06/17/2019  . Pneumococcal Conjugate-13 03/24/2014  . Pneumococcal Polysaccharide-23 03/04/1998  . Td 03/06/2008  . Zoster 07/30/2007  . Zoster Recombinat (Shingrix) 01/06/2017, 10/27/2017    Health Maintenance  Topic Date Due  . Samul Dada  03/06/2018  . INFLUENZA VACCINE  12/15/2019  . DEXA SCAN  Completed  . COVID-19 Vaccine  Completed  . PNA vac Low Risk Adult  Completed     Discussed health benefits of physical activity, and encouraged her to engage in regular exercise appropriate for her age and condition.    1. Encounter for subsequent annual wellness visit (AWV) in Medicare patient  - TSH  2. Annual physical exam   3. Essential hypertension  - Comprehensive metabolic panel - TSH  4. Hyperglycemia  - Hemoglobin A1c  5. Hypercholesterolemia  - Lipid Panel With LDL/HDL Ratio  6. Frequent urination  - CBC with Differential/Platelet - TSH  7. Neuropathy  - Vitamin B12  8. Yeast vaginitis  - terconazole (TERAZOL 3) 0.8 % vaginal cream; Place 1 applicator vaginally at bedtime.  Dispense: 20 g; Refill: 3    - POCT urinalysis dipstick   Return in about 3 months (around 04/21/2020) for chronic disease f/u.     I, [name], MD, have reviewed all documentation for this visit. The documentation on 01/31/20 for the exam, diagnosis, procedures, and orders are all accurate and complete.    Richard Cranford Mon, MD  [facility] [phone] (phone) [phone] (fax)  [place]

## 2020-01-21 ENCOUNTER — Other Ambulatory Visit: Payer: Self-pay

## 2020-01-21 ENCOUNTER — Ambulatory Visit (INDEPENDENT_AMBULATORY_CARE_PROVIDER_SITE_OTHER): Payer: PPO | Admitting: Family Medicine

## 2020-01-21 DIAGNOSIS — I10 Essential (primary) hypertension: Secondary | ICD-10-CM

## 2020-01-21 DIAGNOSIS — I509 Heart failure, unspecified: Secondary | ICD-10-CM | POA: Diagnosis not present

## 2020-01-21 DIAGNOSIS — R350 Frequency of micturition: Secondary | ICD-10-CM | POA: Diagnosis not present

## 2020-01-21 DIAGNOSIS — B373 Candidiasis of vulva and vagina: Secondary | ICD-10-CM

## 2020-01-21 DIAGNOSIS — G629 Polyneuropathy, unspecified: Secondary | ICD-10-CM

## 2020-01-21 DIAGNOSIS — B3731 Acute candidiasis of vulva and vagina: Secondary | ICD-10-CM

## 2020-01-21 DIAGNOSIS — Z0000 Encounter for general adult medical examination without abnormal findings: Secondary | ICD-10-CM | POA: Diagnosis not present

## 2020-01-21 DIAGNOSIS — R739 Hyperglycemia, unspecified: Secondary | ICD-10-CM

## 2020-01-21 DIAGNOSIS — E7800 Pure hypercholesterolemia, unspecified: Secondary | ICD-10-CM

## 2020-01-21 LAB — POCT URINALYSIS DIPSTICK
Bilirubin, UA: NEGATIVE
Blood, UA: NEGATIVE
Glucose, UA: NEGATIVE
Ketones, UA: NEGATIVE
Leukocytes, UA: NEGATIVE
Nitrite, UA: NEGATIVE
Protein, UA: NEGATIVE
Spec Grav, UA: 1.015 (ref 1.010–1.025)
Urobilinogen, UA: 0.2 E.U./dL
pH, UA: 6.5 (ref 5.0–8.0)

## 2020-01-21 MED ORDER — TERCONAZOLE 0.8 % VA CREA: 1.0000 | TOPICAL_CREAM | Freq: Every day | VAGINAL | 3 refills | Status: DC

## 2020-01-21 NOTE — Patient Instructions (Signed)
Preventive Care 84 Years and Older, Female Preventive care refers to lifestyle choices and visits with your health care provider that can promote health and wellness. This includes:  A yearly physical exam. This is also called an annual well check.  Regular dental and eye exams.  Immunizations.  Screening for certain conditions.  Healthy lifestyle choices, such as diet and exercise. What can I expect for my preventive care visit? Physical exam Your health care provider will check:  Height and weight. These may be used to calculate body mass index (BMI), which is a measurement that tells if you are at a healthy weight.  Heart rate and blood pressure.  Your skin for abnormal spots. Counseling Your health care provider may ask you questions about:  Alcohol, tobacco, and drug use.  Emotional well-being.  Home and relationship well-being.  Sexual activity.  Eating habits.  History of falls.  Memory and ability to understand (cognition).  Work and work Statistician.  Pregnancy and menstrual history. What immunizations do I need?  Influenza (flu) vaccine  This is recommended every year. Tetanus, diphtheria, and pertussis (Tdap) vaccine  You may need a Td booster every 10 years. Varicella (chickenpox) vaccine  You may need this vaccine if you have not already been vaccinated. Zoster (shingles) vaccine  You may need this after age 33. Pneumococcal conjugate (PCV13) vaccine  One dose is recommended after age 33. Pneumococcal polysaccharide (PPSV23) vaccine  One dose is recommended after age 72. Measles, mumps, and rubella (MMR) vaccine  You may need at least one dose of MMR if you were born in 1957 or later. You may also need a second dose. Meningococcal conjugate (MenACWY) vaccine  You may need this if you have certain conditions. Hepatitis A vaccine  You may need this if you have certain conditions or if you travel or work in places where you may be exposed  to hepatitis A. Hepatitis B vaccine  You may need this if you have certain conditions or if you travel or work in places where you may be exposed to hepatitis B. Haemophilus influenzae type b (Hib) vaccine  You may need this if you have certain conditions. You may receive vaccines as individual doses or as more than one vaccine together in one shot (combination vaccines). Talk with your health care provider about the risks and benefits of combination vaccines. What tests do I need? Blood tests  Lipid and cholesterol levels. These may be checked every 5 years, or more frequently depending on your overall health.  Hepatitis C test.  Hepatitis B test. Screening  Lung cancer screening. You may have this screening every year starting at age 39 if you have a 30-pack-year history of smoking and currently smoke or have quit within the past 15 years.  Colorectal cancer screening. All adults should have this screening starting at age 36 and continuing until age 15. Your health care provider may recommend screening at age 23 if you are at increased risk. You will have tests every 1-10 years, depending on your results and the type of screening test.  Diabetes screening. This is done by checking your blood sugar (glucose) after you have not eaten for a while (fasting). You may have this done every 1-3 years.  Mammogram. This may be done every 1-2 years. Talk with your health care provider about how often you should have regular mammograms.  BRCA-related cancer screening. This may be done if you have a family history of breast, ovarian, tubal, or peritoneal cancers.  Other tests  Sexually transmitted disease (STD) testing.  Bone density scan. This is done to screen for osteoporosis. You may have this done starting at age 44. Follow these instructions at home: Eating and drinking  Eat a diet that includes fresh fruits and vegetables, whole grains, lean protein, and low-fat dairy products. Limit  your intake of foods with high amounts of sugar, saturated fats, and salt.  Take vitamin and mineral supplements as recommended by your health care provider.  Do not drink alcohol if your health care provider tells you not to drink.  If you drink alcohol: ? Limit how much you have to 0-1 drink a day. ? Be aware of how much alcohol is in your drink. In the [place] one drink equals one 12 oz bottle of beer (355 mL), one 5 oz glass of wine (148 mL), or one 1 oz glass of hard liquor (44 mL). Lifestyle  Take daily care of your teeth and gums.  Stay active. Exercise for at least 30 minutes on 5 or more days each week.  Do not use any products that contain nicotine or tobacco, such as cigarettes, e-cigarettes, and chewing tobacco. If you need help quitting, ask your health care provider.  If you are sexually active, practice safe sex. Use a condom or other form of protection in order to prevent STIs (sexually transmitted infections).  Talk with your health care provider about taking a low-dose aspirin or statin. What's next?  Go to your health care provider once a year for a well check visit.  Ask your health care provider how often you should have your eyes and teeth checked.  Stay up to date on all vaccines. This information is not intended to replace advice given to you by your health care provider. Make sure you discuss any questions you have with your health care provider. Document Revised: 04/26/2018 Document Reviewed: 04/26/2018 Elsevier Patient Education  2020 [employer].

## 2020-01-22 LAB — COMPREHENSIVE METABOLIC PANEL
ALT: 10 IU/L (ref 0–32)
AST: 17 IU/L (ref 0–40)
Albumin/Globulin Ratio: 2 (ref 1.2–2.2)
Albumin: 4.1 g/dL (ref 3.6–4.6)
Alkaline Phosphatase: 74 IU/L (ref 48–121)
BUN/Creatinine Ratio: 16 (ref 12–28)
BUN: 14 mg/dL (ref 8–27)
Bilirubin Total: 0.4 mg/dL (ref 0.0–1.2)
CO2: 21 mmol/L (ref 20–29)
Calcium: 9.8 mg/dL (ref 8.7–10.3)
Chloride: 104 mmol/L (ref 96–106)
Creatinine, Ser: 0.9 mg/dL (ref 0.57–1.00)
GFR calc Af Amer: 66 mL/min/{1.73_m2} (ref >59)
GFR calc non Af Amer: 57 mL/min/{1.73_m2} — ABNORMAL LOW (ref >59)
Globulin, Total: 2.1 g/dL (ref 1.5–4.5)
Glucose: 100 mg/dL — ABNORMAL HIGH (ref 65–99)
Potassium: 4.5 mmol/L (ref 3.5–5.2)
Sodium: 139 mmol/L (ref 134–144)
Total Protein: 6.2 g/dL (ref 6.0–8.5)

## 2020-01-22 LAB — LIPID PANEL WITH LDL/HDL RATIO
Cholesterol, Total: 205 mg/dL — ABNORMAL HIGH (ref 100–199)
HDL: 81 mg/dL (ref >39)
LDL Chol Calc (NIH): 110 mg/dL — ABNORMAL HIGH (ref 0–99)
LDL/HDL Ratio: 1.4 ratio (ref 0.0–3.2)
Triglycerides: 81 mg/dL (ref 0–149)
VLDL Cholesterol Cal: 14 mg/dL (ref 5–40)

## 2020-01-22 LAB — VITAMIN B12: Vitamin B-12: 273 pg/mL (ref 232–1245)

## 2020-01-22 LAB — CBC WITH DIFFERENTIAL/PLATELET
Basophils Absolute: 0 10*3/uL (ref 0.0–0.2)
Basos: 1 %
EOS (ABSOLUTE): 0.1 10*3/uL (ref 0.0–0.4)
Eos: 1 %
Hematocrit: 41.3 % (ref 34.0–46.6)
Hemoglobin: 14.4 g/dL (ref 11.1–15.9)
Immature Grans (Abs): 0 10*3/uL (ref 0.0–0.1)
Immature Granulocytes: 0 %
Lymphocytes Absolute: 1.6 10*3/uL (ref 0.7–3.1)
Lymphs: 28 %
MCH: 32.6 pg (ref 26.6–33.0)
MCHC: 34.9 g/dL (ref 31.5–35.7)
MCV: 93 fL (ref 79–97)
Monocytes Absolute: 0.4 10*3/uL (ref 0.1–0.9)
Monocytes: 7 %
Neutrophils Absolute: 3.8 10*3/uL (ref 1.4–7.0)
Neutrophils: 63 %
Platelets: 233 10*3/uL (ref 150–450)
RBC: 4.42 x10E6/uL (ref 3.77–5.28)
RDW: 12.2 % (ref 11.7–15.4)
WBC: 5.9 10*3/uL (ref 3.4–10.8)

## 2020-01-22 LAB — HEMOGLOBIN A1C
Est. average glucose Bld gHb Est-mCnc: 134 mg/dL
Hgb A1c MFr Bld: 6.3 % — ABNORMAL HIGH (ref 4.8–5.6)

## 2020-01-22 LAB — TSH: TSH: 3.09 u[IU]/mL (ref 0.450–4.500)

## 2020-02-20 ENCOUNTER — Other Ambulatory Visit: Payer: Self-pay

## 2020-02-20 ENCOUNTER — Ambulatory Visit (INDEPENDENT_AMBULATORY_CARE_PROVIDER_SITE_OTHER): Payer: Medicare Other

## 2020-02-20 ENCOUNTER — Ambulatory Visit (INDEPENDENT_AMBULATORY_CARE_PROVIDER_SITE_OTHER): Payer: PPO

## 2020-02-20 DIAGNOSIS — Z23 Encounter for immunization: Secondary | ICD-10-CM | POA: Diagnosis not present

## 2020-06-23 ENCOUNTER — Telehealth: Payer: Self-pay

## 2020-06-23 NOTE — Telephone Encounter (Signed)
Ok to schedule in office visit? Or does it need to be virtual?

## 2020-06-23 NOTE — Telephone Encounter (Signed)
Copied from [id number] [phone]. Topic: Quick Communication - See Telephone Encounter >> Jun 23, 2020  8:42 AM Loma Boston wrote: CRM for notification. See Telephone encounter for: 06/23/20. Pls call pt back for scheduling. Pt has called in for burning tingling feet and one of her arches is really  hurting and thinks she also may have UTI. Pt failed DT as wanted to let Dr Darnell Level know her cough had gotten a little worse, but had for months, SOB, but states had sometime, Further questions, She does state that when awaking in the mornings she can hardly swallow as her throat is so dry. Told pt that Dr Darnell Level just would need to look at her symptoms and verify that they are ongoing and the office would be back in contact shortly for scheduling. [phone] FU please

## 2020-06-25 ENCOUNTER — Other Ambulatory Visit: Payer: Self-pay

## 2020-06-25 ENCOUNTER — Ambulatory Visit: Payer: PPO | Admitting: Podiatry

## 2020-06-25 ENCOUNTER — Encounter: Payer: Self-pay | Admitting: Podiatry

## 2020-06-25 ENCOUNTER — Ambulatory Visit (INDEPENDENT_AMBULATORY_CARE_PROVIDER_SITE_OTHER): Payer: PPO

## 2020-06-25 VITALS — BP 161/88 | HR 70 | Temp 97.3°F | Resp 20

## 2020-06-25 DIAGNOSIS — M2011 Hallux valgus (acquired), right foot: Secondary | ICD-10-CM | POA: Diagnosis not present

## 2020-06-25 DIAGNOSIS — M76821 Posterior tibial tendinitis, right leg: Secondary | ICD-10-CM | POA: Diagnosis not present

## 2020-06-25 NOTE — Telephone Encounter (Signed)
Sorry, I just saw the message.  I can see her next week but I can see her this week.  Thank you.  This can be an in office visit

## 2020-06-25 NOTE — Telephone Encounter (Signed)
Scheduled pt for next week. Also, gave her the option to be seen tomorrow by another provider and she declined.

## 2020-06-25 NOTE — Progress Notes (Signed)
Subjective:  Patient ID: Deanna Williams, female    DOB: 1929-03-26,  MRN: [id number]  Chief Complaint  Patient presents with  . Foot Pain    "My toes are red, I hurt over here (Bunion), and I hurt here (Arch).  I get numbness and tingling in my foot.  The skin feel like it's tightening up."    87 y.o. female presents with the above complaint.  Patient presents with primary complaint of right medial foot pain.  Patient states been going on for quite some time.  Patient states that is hurts with ambulation.  It hurts right in the arch.  She is also get some numbness tingling as well.  Patient feels like it is bruising.  She denies seeing anyone else prior to seeing me.  She would like to get it evaluated.  The right side is much more worse on the left side.  Her pain scale is sharp shooting with dull achy component to it.  Pain scale 6 out of 10.   Review of Systems: Negative except as noted in the HPI. Denies N/V/F/Ch.  Past Medical History:  Diagnosis Date  . Gastroesophageal reflux disease   . Hyperlipidemia   . Hypertension   . Lichen sclerosus   . Lichen sclerosus et atrophicus   . Osteopenia   . Palpitations     Current Outpatient Medications:  .  clobetasol ointment (TEMOVATE) 3.90 %, Apply 1 application topically 2 (two) times daily., Disp: , Rfl:  .  Omega-3 Fatty Acids (FISH OIL) 1000 MG CPDR, Take by mouth., Disp: , Rfl:  .  terconazole (TERAZOL 3) 0.8 % vaginal cream, Place 1 applicator vaginally at bedtime., Disp: 20 g, Rfl: 3 .  Vitamin D, Cholecalciferol, 50 MCG (2000 UT) CAPS, Take by mouth., Disp: , Rfl:   Social History   Tobacco Use  Smoking Status Never Smoker  Smokeless Tobacco Never Used    No Known Allergies Objective:   Vitals:   06/25/20 1125  BP: (!) 161/88  Pulse: 70  Resp: 20  Temp: (!) 97.3 F (36.3 C)   There is no height or weight on file to calculate BMI. Constitutional Well developed. Well nourished.  Vascular Dorsalis pedis pulses  palpable bilaterally. Posterior tibial pulses palpable bilaterally. Capillary refill normal to all digits.  No cyanosis or clubbing noted. Pedal hair growth normal.  Neurologic Normal speech. Oriented to person, place, and time. Epicritic sensation to light touch grossly present bilaterally.  Dermatologic Nails well groomed and normal in appearance. No open wounds. No skin lesions.  Orthopedic:  Pain on palpation to the posterior medial tendon along the course of the tendon including the insertion.  No pain in the retromalleolar part of the posterior tibial tendon.  No pain at the Achilles tendon, ATFL ligament, peroneal tendon.  Pain with resisted plantarflexion inversion of the foot.   Radiographs: 3 views of skeletally mature the right foot: No fractures noted.  Midfoot arthritis noted.  Subtalar joint arthritis noted.  Bunion deformity that appears to be moderate in nature with moderate IM noted. Assessment:   1. Posterior tibial tendinitis, right    Plan:  Patient was evaluated and treated and all questions answered.  Right posterior tibial tendinitis -I explained the patient the etiology of tendinitis and various treatment options were extensively discussed.  Given the amount of pain she is having I believe patient will benefit from cam boot immobilization to allow the soft tissue to heal appropriately.  Patient agrees with the  plan is and will be able to tolerate the cam boot.  I discussed with her that your other joints could also get aggravated because of the imbalance with the boot and the shoe.  Patient agrees with that and would like to proceed with a cam boot immobilization. -Cam boot was dispensed.  If there is no improvement will consider MRI evaluation  No follow-ups on file.

## 2020-06-29 ENCOUNTER — Other Ambulatory Visit: Payer: Self-pay

## 2020-06-29 ENCOUNTER — Encounter: Payer: Self-pay | Admitting: Family Medicine

## 2020-06-29 ENCOUNTER — Ambulatory Visit (INDEPENDENT_AMBULATORY_CARE_PROVIDER_SITE_OTHER): Payer: PPO | Admitting: Family Medicine

## 2020-06-29 VITALS — BP 121/75 | HR 82 | Temp 98.0°F | Resp 18 | Ht 63.0 in | Wt 155.0 lb

## 2020-06-29 DIAGNOSIS — N342 Other urethritis: Secondary | ICD-10-CM | POA: Diagnosis not present

## 2020-06-29 DIAGNOSIS — R350 Frequency of micturition: Secondary | ICD-10-CM

## 2020-06-29 DIAGNOSIS — R059 Cough, unspecified: Secondary | ICD-10-CM | POA: Diagnosis not present

## 2020-06-29 DIAGNOSIS — J309 Allergic rhinitis, unspecified: Secondary | ICD-10-CM

## 2020-06-29 DIAGNOSIS — K219 Gastro-esophageal reflux disease without esophagitis: Secondary | ICD-10-CM | POA: Diagnosis not present

## 2020-06-29 MED ORDER — PREMARIN 0.625 MG/GM VA CREA: 1.0000 | TOPICAL_CREAM | Freq: Every day | VAGINAL | 12 refills | Status: DC

## 2020-06-29 MED ORDER — OMEPRAZOLE 20 MG PO CPDR: 20.0000 mg | DELAYED_RELEASE_CAPSULE | Freq: Every day | ORAL | 3 refills | Status: DC

## 2020-06-29 NOTE — Progress Notes (Signed)
Established patient visit   Patient: Deanna Williams   DOB: August 20, 1928   87 y.o. Female  MRN: [id number] Visit Date: 06/29/2020  Today's healthcare provider: [name], MD   Chief Complaint  Patient presents with  . Dysuria   Subjective    Dysuria  This is a new problem. The current episode started more than 1 month ago (2 months). The quality of the pain is described as burning and aching. The pain is at a severity of 4/10. The pain is mild. There has been no fever. Associated symptoms include flank pain, frequency, hesitancy, a possible pregnancy and urgency. Pertinent negatives include no chills, discharge, hematuria, nausea, sweats or vomiting. She has tried increased fluids for the symptoms.    Patient has had burning, itching and frequency upon urination for the past 2 months. Patient has been trying to treat symptoms at home with increased fluids and vagasil. Patient also has symptoms oif incontinence.   Patient also has symptoms of cough and dry mouth.  No significant allergy symptoms otherwise.  No chest pain or documented heartburn.  She does have a history of mild hyperglycemia.      Medications: Outpatient Medications Prior to Visit  Medication Sig  . Omega-3 Fatty Acids (FISH OIL) 1000 MG CPDR Take by mouth.  . Vitamin D, Cholecalciferol, 50 MCG (2000 UT) CAPS Take by mouth.  . clobetasol ointment (TEMOVATE) 3.38 % Apply 1 application topically 2 (two) times daily. (Patient not taking: Reported on 06/29/2020)  . terconazole (TERAZOL 3) 0.8 % vaginal cream Place 1 applicator vaginally at bedtime. (Patient not taking: Reported on 06/29/2020)   No facility-administered medications prior to visit.    Review of Systems  Constitutional: Negative for appetite change, chills, fatigue and fever.  Respiratory: Negative for chest tightness and shortness of breath.   Cardiovascular: Negative for chest pain and palpitations.  Gastrointestinal: Negative for  abdominal pain, nausea and vomiting.  Genitourinary: Positive for dysuria, flank pain, frequency, hesitancy and urgency. Negative for hematuria.  Neurological: Negative for dizziness and weakness.        Objective    BP 121/75 (BP Location: Right Arm, Patient Position: Sitting, Cuff Size: Normal)   Pulse 82   Temp 98 F (36.7 C) (Oral)   Resp 18   Ht 5\' 3"  (1.6 m)   Wt 155 lb (70.3 kg)   SpO2 95%   BMI 27.46 kg/m  BP Readings from Last 3 Encounters:  06/29/20 121/75  06/25/20 (!) 161/88  09/05/19 134/78   Wt Readings from Last 3 Encounters:  06/29/20 155 lb (70.3 kg)  09/05/19 155 lb 9.6 oz (70.6 kg)  04/10/19 153 lb 12.8 oz (69.8 kg)       Physical Exam Vitals reviewed.  Constitutional:      Appearance: She is well-developed.  HENT:     Head: Normocephalic and atraumatic.     Right Ear: External ear normal.     Left Ear: External ear normal.     Nose: Nose normal.  Eyes:     General: No scleral icterus.    Conjunctiva/sclera: Conjunctivae normal.     Pupils: Pupils are equal, round, and reactive to light.  Neck:     Thyroid: No thyromegaly.  Cardiovascular:     Rate and Rhythm: Normal rate and regular rhythm.     Heart sounds: Normal heart sounds.  Pulmonary:     Effort: Pulmonary effort is normal.     Breath sounds: Normal  breath sounds.  Abdominal:     Palpations: Abdomen is soft.     Tenderness: There is no abdominal tenderness.     Comments: No CVAT.  Lymphadenopathy:     Cervical: No cervical adenopathy.  Skin:    General: Skin is warm and dry.  Neurological:     General: No focal deficit present.     Mental Status: She is alert and oriented to person, place, and time.  Psychiatric:        Mood and Affect: Mood normal.        Behavior: Behavior normal.        Thought Content: Thought content normal.        Judgment: Judgment normal.       No results found for any visits on 06/29/20.  Assessment & Plan     1. Atrophic  urethritis Presumptively treat her urinary symptoms.  Follow-up in 1 to 2 months - conjugated estrogens (PREMARIN) vaginal cream; Place 1 Applicatorful vaginally daily.  Dispense: 42.5 g; Refill: 12  2. Cough Treat for reflux versus this is most likely.  If it persists will need x-ray and then treat allergies. - omeprazole (PRILOSEC) 20 MG capsule; Take 1 capsule (20 mg total) by mouth daily.  Dispense: 30 capsule; Refill: 3  3. Frequent urination A1c is 6.1.  No diabetes. - Urine Culture  4. Allergic rhinitis, unspecified seasonality, unspecified trigger Consider Zyrtec  5. Gastroesophageal reflux disease without esophagitis Try omeprazole daily.  I think this is probably the source of the cough.  No signs or symptoms of heart failure.   No follow-ups on file.      I, [name], MD, have reviewed all documentation for this visit. The documentation on 07/02/20 for the exam, diagnosis, procedures, and orders are all accurate and complete.    Gleb Mcguire Cranford Mon, MD  [facility] [phone] (phone) [phone] (fax)  [place]

## 2020-07-01 LAB — URINE CULTURE

## 2020-07-02 ENCOUNTER — Telehealth: Payer: Self-pay

## 2020-07-02 DIAGNOSIS — R350 Frequency of micturition: Secondary | ICD-10-CM

## 2020-07-02 MED ORDER — SULFAMETHOXAZOLE-TRIMETHOPRIM 400-80 MG PO TABS: 1.0000 | ORAL_TABLET | Freq: Two times a day (BID) | ORAL | 0 refills | Status: AC

## 2020-07-02 NOTE — Telephone Encounter (Signed)
-----   Message from Jerrol Banana., MD sent at 07/02/2020  1:39 PM EST ----- Possible mild infection.  Patient still having symptoms we will treat with regular dose of Bactrim twice a day for 3 days.  Not the DS.

## 2020-07-02 NOTE — Telephone Encounter (Signed)
Advised patient of results. Medication was sent into the pharmacy.  

## 2020-07-16 ENCOUNTER — Other Ambulatory Visit: Payer: Self-pay

## 2020-07-16 ENCOUNTER — Ambulatory Visit (INDEPENDENT_AMBULATORY_CARE_PROVIDER_SITE_OTHER): Payer: PPO | Admitting: Podiatry

## 2020-07-16 ENCOUNTER — Encounter: Payer: Self-pay | Admitting: Podiatry

## 2020-07-16 DIAGNOSIS — M76821 Posterior tibial tendinitis, right leg: Secondary | ICD-10-CM

## 2020-07-16 NOTE — Progress Notes (Signed)
Subjective:  Patient ID: Deanna Williams, female    DOB: [birth year],  MRN: [id number]  Chief Complaint  Patient presents with  . Foot Pain    "its some better. Not as much pulling pains in my toe and arch now"    87 y.o. female presents with the above complaint.  Patient presents with follow-up of right posterior tibial tendinitis.  She states she is doing a lot better.  She does not have as much pulling in the arch.  She was not able to tolerate the boot as much however she is liking the Tri-Lock ankle brace that she picked up in the office.  She denies any other acute complaints.  At this time she is about 80% improved.  She has also obtained better shoes that can help with the pain.  She denies any other acute complaints   Review of Systems: Negative except as noted in the HPI. Denies N/V/F/Ch.  Past Medical History:  Diagnosis Date  . Gastroesophageal reflux disease   . Hyperlipidemia   . Hypertension   . Lichen sclerosus   . Lichen sclerosus et atrophicus   . Osteopenia   . Palpitations     Current Outpatient Medications:  .  clobetasol ointment (TEMOVATE) 5.17 %, Apply 1 application topically 2 (two) times daily. (Patient not taking: Reported on 06/29/2020), Disp: , Rfl:  .  conjugated estrogens (PREMARIN) vaginal cream, Place 1 Applicatorful vaginally daily., Disp: 42.5 g, Rfl: 12 .  Omega-3 Fatty Acids (FISH OIL) 1000 MG CPDR, Take by mouth., Disp: , Rfl:  .  omeprazole (PRILOSEC) 20 MG capsule, Take 1 capsule (20 mg total) by mouth daily., Disp: 30 capsule, Rfl: 3 .  terconazole (TERAZOL 3) 0.8 % vaginal cream, Place 1 applicator vaginally at bedtime. (Patient not taking: Reported on 06/29/2020), Disp: 20 g, Rfl: 3 .  Vitamin D, Cholecalciferol, 50 MCG (2000 UT) CAPS, Take by mouth., Disp: , Rfl:   Social History   Tobacco Use  Smoking Status Never Smoker  Smokeless Tobacco Never Used    No Known Allergies Objective:   There were no vitals filed for this  visit. There is no height or weight on file to calculate BMI. Constitutional Well developed. Well nourished.  Vascular Dorsalis pedis pulses palpable bilaterally. Posterior tibial pulses palpable bilaterally. Capillary refill normal to all digits.  No cyanosis or clubbing noted. Pedal hair growth normal.  Neurologic Normal speech. Oriented to person, place, and time. Epicritic sensation to light touch grossly present bilaterally.  Dermatologic Nails well groomed and normal in appearance. No open wounds. No skin lesions.  Orthopedic:  Mild pain on palpation to the posterior medial tendon along the course of the tendon including the insertion.  No pain in the retromalleolar part of the posterior tibial tendon.  No pain at the Achilles tendon, ATFL ligament, peroneal tendon.  Mild pain with resisted plantarflexion inversion of the foot.   Radiographs: 3 views of skeletally mature the right foot: No fractures noted.  Midfoot arthritis noted.  Subtalar joint arthritis noted.  Bunion deformity that appears to be moderate in nature with moderate IM noted. Assessment:   1. Posterior tibial tendinitis, right    Plan:  Patient was evaluated and treated and all questions answered.  Right posterior tibial tendinitis -Clinically patient is improved considerably with immobilization and Tri-Lock ankle brace.  At this time she is about 80% improved.  I discussed with the patient in extensive detail about her future treatment plans.  At this time  I would hold off on any steroid injection or obtaining an MRI as her pain has considerably improved she is able to manage her pain.  I discussed with her that if her pain comes back and is not manageable to place her self in the cam boot and come see me right away.  She states understanding.  I have pretty low threshold to obtain MRI for her.  No follow-ups on file.

## 2020-07-17 NOTE — Progress Notes (Signed)
I,[name],acting as a scribe for [name], MD.,have documented all relevant documentation on the behalf of [name], MD,as directed by  [name], MD while in the presence of [name], MD.   Established patient visit   Patient: Deanna Williams   DOB: 07-01-1928   87 y.o. Female  MRN: [id number] Visit Date: 07/20/2020  Today's healthcare provider: [name], MD   Chief Complaint  Patient presents with  . Follow-up  . Prediabetes   Subjective    HPI  Delightful 87 year old who appears much younger than her age comes in today for follow-up of symptoms consistent with atrophic urethritis.  She states the symptoms are 80% better on Premarin vaginal cream applied topically at night.  Just a dab in the area of their urethral opening. She has had no falls.  She has had 3 Covid vaccines and both shingles vaccines in addition to the old shingles vaccine. Overall she feels well.  Patient states that recently she is found herself snoring some at night.  No apnea no history of this.  She states she has a dry mouth at times.  Prediabetes, Follow-up  Lab Results  Component Value Date   HGBA1C 5.9 (A) 07/20/2020   HGBA1C 6.3 (H) 01/21/2020   HGBA1C 6.1 (A) 05/02/2018   GLUCOSE 100 (H) 01/21/2020   GLUCOSE 108 (H) 04/10/2019   GLUCOSE 102 (H) 07/19/2018    Last seen for for this 6 months ago.  Management since that visit includes; no changes were made at this visit. Current symptoms include none and have been unchanged.  Prior visit with dietician: no Current diet: well balanced Current exercise: walking  Pertinent Labs:    Component Value Date/Time   CHOL 205 (H) 01/21/2020 1041   TRIG 81 01/21/2020 1041   CREATININE 0.90 01/21/2020 1041   CREATININE 0.82 03/07/2017 0855    Wt Readings from Last 3 Encounters:  07/20/20 155 lb (70.3 kg)  06/29/20 155 lb (70.3 kg)  09/05/19 155 lb 9.6 oz (70.6 kg)     -------------------------------------------------------------------- Atrophic urethritis From 06/29/2020-Presumptively treat her urinary symptoms.  Follow-up in 1 to 2 months.  Patient is still having itching and burning in vaginal area.      Medications: Outpatient Medications Prior to Visit  Medication Sig  . conjugated estrogens (PREMARIN) vaginal cream Place 1 Applicatorful vaginally daily.  . Omega-3 Fatty Acids (FISH OIL) 1000 MG CPDR Take by mouth.  Marland Kitchen omeprazole (PRILOSEC) 20 MG capsule Take 1 capsule (20 mg total) by mouth daily.  . vitamin B-12 (CYANOCOBALAMIN) 1000 MCG tablet Take 1,000 mcg by mouth daily.  . Vitamin D, Cholecalciferol, 50 MCG (2000 UT) CAPS Take by mouth.  . clobetasol ointment (TEMOVATE) 7.85 % Apply 1 application topically 2 (two) times daily. (Patient not taking: No sig reported)  . terconazole (TERAZOL 3) 0.8 % vaginal cream Place 1 applicator vaginally at bedtime. (Patient not taking: Reported on 07/20/2020)   No facility-administered medications prior to visit.    Review of Systems  Constitutional: Negative for appetite change, chills, fatigue and fever.  Respiratory: Negative for chest tightness and shortness of breath.   Cardiovascular: Negative for chest pain and palpitations.  Gastrointestinal: Negative for abdominal pain, nausea and vomiting.  Neurological: Negative for dizziness and weakness.        Objective    BP 135/82 (BP Location: Left Arm, Patient Position: Sitting, Cuff Size: Normal)   Pulse 66   Temp 98.3  F (36.8 C) (Oral)   Resp 18   Ht 5\' 3"  (1.6 m)   Wt 155 lb (70.3 kg)   SpO2 95%   BMI 27.46 kg/m  BP Readings from Last 3 Encounters:  07/20/20 135/82  06/29/20 121/75  06/25/20 (!) 161/88   Wt Readings from Last 3 Encounters:  07/20/20 155 lb (70.3 kg)  06/29/20 155 lb (70.3 kg)  09/05/19 155 lb 9.6 oz (70.6 kg)       Physical Exam    Results for orders placed or performed in visit on 07/20/20  POCT  glycosylated hemoglobin (Hb A1C)  Result Value Ref Range   Hemoglobin A1C 5.9 (A) 4.0 - 5.6 %   Est. average glucose Bld gHb Est-mCnc 123   POCT urinalysis dipstick  Result Value Ref Range   Color, UA Yellow    Clarity, UA Clear    Glucose, UA Negative Negative   Bilirubin, UA Negative    Ketones, UA Negative    Spec Grav, UA 1.010 1.010 - 1.025   Blood, UA Negative    pH, UA 7.5 5.0 - 8.0   Protein, UA Negative Negative   Urobilinogen, UA 0.2 0.2 or 1.0 E.U./dL   Nitrite, UA Negative    Leukocytes, UA Negative Negative    Assessment & Plan     1. Hyperglycemia A1c under good control at 5.9%.  Continue to work on diet and exercise - POCT glycosylated hemoglobin (Hb A1C)  2. Atrophic urethritis 80% improved on topical Premarin.  Patient declines pelvic exam today.  Follow-up this summer - POCT urinalysis dipstick  3. Snoring I do not think she has sleep apnea.  Follow-up clinically  4. Gastroesophageal reflux disease without esophagitis Needs PPI for symptom control   No follow-ups on file.      I, [name], MD, have reviewed all documentation for this visit. The documentation on 07/20/20 for the exam, diagnosis, procedures, and orders are all accurate and complete.    Ashawna Hanback Cranford Mon, MD  [facility] [phone] (phone) [phone] (fax)  [place]

## 2020-07-20 ENCOUNTER — Other Ambulatory Visit: Payer: Self-pay

## 2020-07-20 ENCOUNTER — Encounter: Payer: Self-pay | Admitting: Family Medicine

## 2020-07-20 ENCOUNTER — Ambulatory Visit (INDEPENDENT_AMBULATORY_CARE_PROVIDER_SITE_OTHER): Payer: PPO | Admitting: Family Medicine

## 2020-07-20 VITALS — BP 135/82 | HR 66 | Temp 98.3°F | Resp 18 | Ht 63.0 in | Wt 155.0 lb

## 2020-07-20 DIAGNOSIS — R739 Hyperglycemia, unspecified: Secondary | ICD-10-CM | POA: Diagnosis not present

## 2020-07-20 DIAGNOSIS — N342 Other urethritis: Secondary | ICD-10-CM | POA: Diagnosis not present

## 2020-07-20 DIAGNOSIS — K219 Gastro-esophageal reflux disease without esophagitis: Secondary | ICD-10-CM

## 2020-07-20 DIAGNOSIS — R0683 Snoring: Secondary | ICD-10-CM | POA: Diagnosis not present

## 2020-07-20 LAB — POCT GLYCOSYLATED HEMOGLOBIN (HGB A1C)
Est. average glucose Bld gHb Est-mCnc: 123
Hemoglobin A1C: 5.9 % — AB (ref 4.0–5.6)

## 2020-07-20 LAB — POCT URINALYSIS DIPSTICK
Bilirubin, UA: NEGATIVE
Blood, UA: NEGATIVE
Glucose, UA: NEGATIVE
Ketones, UA: NEGATIVE
Leukocytes, UA: NEGATIVE
Nitrite, UA: NEGATIVE
Protein, UA: NEGATIVE
Spec Grav, UA: 1.01 (ref 1.010–1.025)
Urobilinogen, UA: 0.2 E.U./dL
pH, UA: 7.5 (ref 5.0–8.0)

## 2020-09-01 ENCOUNTER — Emergency Department: Payer: PPO

## 2020-09-01 ENCOUNTER — Inpatient Hospital Stay: Admit: 2020-09-01 | Payer: PPO

## 2020-09-01 ENCOUNTER — Inpatient Hospital Stay (HOSPITAL_COMMUNITY)
Admission: EM | Admit: 2020-09-01 | Discharge: 2020-09-05 | DRG: 023 | Disposition: A | Discharge status: Rehabilitation Facility | Payer: PPO | Source: Other Acute Inpatient Hospital | Attending: Neurology | Admitting: Neurology

## 2020-09-01 ENCOUNTER — Encounter (HOSPITAL_COMMUNITY)
Admission: EM | Disposition: A | Discharge status: Rehabilitation Facility | Payer: Self-pay | Source: Other Acute Inpatient Hospital | Attending: Neurology

## 2020-09-01 ENCOUNTER — Emergency Department (HOSPITAL_COMMUNITY): Payer: PPO

## 2020-09-01 ENCOUNTER — Inpatient Hospital Stay (HOSPITAL_COMMUNITY): Payer: PPO

## 2020-09-01 ENCOUNTER — Inpatient Hospital Stay
Admission: EM | Admit: 2020-09-01 | Discharge: 2020-09-01 | DRG: 065 | Disposition: A | Discharge status: Other Acute Inpatient Hospital | Payer: PPO | Attending: Neurology | Admitting: Neurology

## 2020-09-01 ENCOUNTER — Emergency Department (HOSPITAL_COMMUNITY): Payer: PPO | Admitting: Certified Registered Nurse Anesthetist

## 2020-09-01 ENCOUNTER — Other Ambulatory Visit: Payer: Self-pay

## 2020-09-01 DIAGNOSIS — Z4682 Encounter for fitting and adjustment of non-vascular catheter: Secondary | ICD-10-CM | POA: Diagnosis not present

## 2020-09-01 DIAGNOSIS — E7800 Pure hypercholesterolemia, unspecified: Secondary | ICD-10-CM | POA: Diagnosis not present

## 2020-09-01 DIAGNOSIS — I6389 Other cerebral infarction: Secondary | ICD-10-CM | POA: Diagnosis not present

## 2020-09-01 DIAGNOSIS — M8580 Other specified disorders of bone density and structure, unspecified site: Secondary | ICD-10-CM | POA: Diagnosis present

## 2020-09-01 DIAGNOSIS — M810 Age-related osteoporosis without current pathological fracture: Secondary | ICD-10-CM | POA: Diagnosis present

## 2020-09-01 DIAGNOSIS — I63432 Cerebral infarction due to embolism of left posterior cerebral artery: Secondary | ICD-10-CM

## 2020-09-01 DIAGNOSIS — I639 Cerebral infarction, unspecified: Secondary | ICD-10-CM | POA: Insufficient documentation

## 2020-09-01 DIAGNOSIS — M79604 Pain in right leg: Secondary | ICD-10-CM | POA: Diagnosis not present

## 2020-09-01 DIAGNOSIS — Z20822 Contact with and (suspected) exposure to covid-19: Secondary | ICD-10-CM | POA: Diagnosis present

## 2020-09-01 DIAGNOSIS — R531 Weakness: Secondary | ICD-10-CM | POA: Diagnosis not present

## 2020-09-01 DIAGNOSIS — R29704 NIHSS score 4: Secondary | ICD-10-CM | POA: Diagnosis present

## 2020-09-01 DIAGNOSIS — M79661 Pain in right lower leg: Secondary | ICD-10-CM | POA: Diagnosis not present

## 2020-09-01 DIAGNOSIS — R29810 Facial weakness: Secondary | ICD-10-CM | POA: Diagnosis present

## 2020-09-01 DIAGNOSIS — R202 Paresthesia of skin: Secondary | ICD-10-CM | POA: Diagnosis not present

## 2020-09-01 DIAGNOSIS — I619 Nontraumatic intracerebral hemorrhage, unspecified: Secondary | ICD-10-CM | POA: Diagnosis not present

## 2020-09-01 DIAGNOSIS — I69319 Unspecified symptoms and signs involving cognitive functions following cerebral infarction: Secondary | ICD-10-CM | POA: Diagnosis not present

## 2020-09-01 DIAGNOSIS — T45525A Adverse effect of antithrombotic drugs, initial encounter: Secondary | ICD-10-CM | POA: Diagnosis not present

## 2020-09-01 DIAGNOSIS — K449 Diaphragmatic hernia without obstruction or gangrene: Secondary | ICD-10-CM | POA: Diagnosis not present

## 2020-09-01 DIAGNOSIS — I480 Paroxysmal atrial fibrillation: Secondary | ICD-10-CM | POA: Diagnosis not present

## 2020-09-01 DIAGNOSIS — Z79899 Other long term (current) drug therapy: Secondary | ICD-10-CM

## 2020-09-01 DIAGNOSIS — I4891 Unspecified atrial fibrillation: Secondary | ICD-10-CM

## 2020-09-01 DIAGNOSIS — I63531 Cerebral infarction due to unspecified occlusion or stenosis of right posterior cerebral artery: Secondary | ICD-10-CM | POA: Diagnosis not present

## 2020-09-01 DIAGNOSIS — Z8261 Family history of arthritis: Secondary | ICD-10-CM | POA: Diagnosis not present

## 2020-09-01 DIAGNOSIS — Z95820 Peripheral vascular angioplasty status with implants and grafts: Secondary | ICD-10-CM | POA: Diagnosis not present

## 2020-09-01 DIAGNOSIS — K219 Gastro-esophageal reflux disease without esophagitis: Secondary | ICD-10-CM | POA: Diagnosis present

## 2020-09-01 DIAGNOSIS — R7303 Prediabetes: Secondary | ICD-10-CM | POA: Diagnosis not present

## 2020-09-01 DIAGNOSIS — I69351 Hemiplegia and hemiparesis following cerebral infarction affecting right dominant side: Secondary | ICD-10-CM | POA: Diagnosis not present

## 2020-09-01 DIAGNOSIS — I63532 Cerebral infarction due to unspecified occlusion or stenosis of left posterior cerebral artery: Secondary | ICD-10-CM | POA: Diagnosis not present

## 2020-09-01 DIAGNOSIS — Z8249 Family history of ischemic heart disease and other diseases of the circulatory system: Secondary | ICD-10-CM

## 2020-09-01 DIAGNOSIS — Z79818 Long term (current) use of other agents affecting estrogen receptors and estrogen levels: Secondary | ICD-10-CM | POA: Diagnosis not present

## 2020-09-01 DIAGNOSIS — I10 Essential (primary) hypertension: Secondary | ICD-10-CM | POA: Diagnosis not present

## 2020-09-01 DIAGNOSIS — E785 Hyperlipidemia, unspecified: Secondary | ICD-10-CM | POA: Diagnosis not present

## 2020-09-01 DIAGNOSIS — I4819 Other persistent atrial fibrillation: Secondary | ICD-10-CM | POA: Diagnosis not present

## 2020-09-01 DIAGNOSIS — R911 Solitary pulmonary nodule: Secondary | ICD-10-CM | POA: Diagnosis not present

## 2020-09-01 DIAGNOSIS — E871 Hypo-osmolality and hyponatremia: Secondary | ICD-10-CM | POA: Diagnosis not present

## 2020-09-01 DIAGNOSIS — J9601 Acute respiratory failure with hypoxia: Secondary | ICD-10-CM | POA: Diagnosis not present

## 2020-09-01 DIAGNOSIS — R52 Pain, unspecified: Secondary | ICD-10-CM

## 2020-09-01 DIAGNOSIS — Z8673 Personal history of transient ischemic attack (TIA), and cerebral infarction without residual deficits: Secondary | ICD-10-CM | POA: Diagnosis present

## 2020-09-01 DIAGNOSIS — G8191 Hemiplegia, unspecified affecting right dominant side: Secondary | ICD-10-CM | POA: Diagnosis present

## 2020-09-01 DIAGNOSIS — R42 Dizziness and giddiness: Secondary | ICD-10-CM | POA: Diagnosis not present

## 2020-09-01 DIAGNOSIS — Z806 Family history of leukemia: Secondary | ICD-10-CM | POA: Diagnosis not present

## 2020-09-01 DIAGNOSIS — J9811 Atelectasis: Secondary | ICD-10-CM | POA: Diagnosis not present

## 2020-09-01 DIAGNOSIS — Z7901 Long term (current) use of anticoagulants: Secondary | ICD-10-CM | POA: Diagnosis not present

## 2020-09-01 DIAGNOSIS — R270 Ataxia, unspecified: Secondary | ICD-10-CM | POA: Diagnosis not present

## 2020-09-01 DIAGNOSIS — Z9889 Other specified postprocedural states: Secondary | ICD-10-CM | POA: Diagnosis not present

## 2020-09-01 DIAGNOSIS — I6602 Occlusion and stenosis of left middle cerebral artery: Secondary | ICD-10-CM | POA: Diagnosis not present

## 2020-09-01 DIAGNOSIS — R0902 Hypoxemia: Secondary | ICD-10-CM | POA: Diagnosis not present

## 2020-09-01 DIAGNOSIS — I6622 Occlusion and stenosis of left posterior cerebral artery: Secondary | ICD-10-CM | POA: Diagnosis not present

## 2020-09-01 DIAGNOSIS — I517 Cardiomegaly: Secondary | ICD-10-CM | POA: Diagnosis not present

## 2020-09-01 DIAGNOSIS — I6502 Occlusion and stenosis of left vertebral artery: Secondary | ICD-10-CM | POA: Diagnosis not present

## 2020-09-01 DIAGNOSIS — I6381 Other cerebral infarction due to occlusion or stenosis of small artery: Secondary | ICD-10-CM | POA: Diagnosis present

## 2020-09-01 DIAGNOSIS — K5900 Constipation, unspecified: Secondary | ICD-10-CM | POA: Diagnosis present

## 2020-09-01 HISTORY — PX: IR PERCUTANEOUS ART THROMBECTOMY/INFUSION INTRACRANIAL INC DIAG ANGIO: IMG6087

## 2020-09-01 HISTORY — PX: IR INTRA CRAN STENT: IMG2345

## 2020-09-01 HISTORY — PX: IR CT HEAD LTD: IMG2386

## 2020-09-01 HISTORY — PX: RADIOLOGY WITH ANESTHESIA: SHX6223

## 2020-09-01 LAB — CBC
HCT: 42.2 % (ref 36.0–46.0)
Hemoglobin: 14.1 g/dL (ref 12.0–15.0)
MCH: 31.3 pg (ref 26.0–34.0)
MCHC: 33.4 g/dL (ref 30.0–36.0)
MCV: 93.8 fL (ref 80.0–100.0)
Platelets: 256 10*3/uL (ref 150–400)
RBC: 4.5 MIL/uL (ref 3.87–5.11)
RDW: 12.3 % (ref 11.5–15.5)
WBC: 5.7 10*3/uL (ref 4.0–10.5)
nRBC: 0 % (ref 0.0–0.2)

## 2020-09-01 LAB — RESP PANEL BY RT-PCR (FLU A&B, COVID) ARPGX2
Influenza A by PCR: NEGATIVE
Influenza B by PCR: NEGATIVE
SARS Coronavirus 2 by RT PCR: NEGATIVE

## 2020-09-01 LAB — COMPREHENSIVE METABOLIC PANEL
ALT: 12 U/L (ref 0–44)
AST: 24 U/L (ref 15–41)
Albumin: 3.7 g/dL (ref 3.5–5.0)
Alkaline Phosphatase: 75 U/L (ref 38–126)
Anion gap: 7 (ref 5–15)
BUN: 12 mg/dL (ref 8–23)
CO2: 23 mmol/L (ref 22–32)
Calcium: 9 mg/dL (ref 8.9–10.3)
Chloride: 107 mmol/L (ref 98–111)
Creatinine, Ser: 0.72 mg/dL (ref 0.44–1.00)
GFR, Estimated: >60 mL/min (ref >60)
Glucose, Bld: 122 mg/dL — ABNORMAL HIGH (ref 70–99)
Potassium: 4.7 mmol/L (ref 3.5–5.1)
Sodium: 137 mmol/L (ref 135–145)
Total Bilirubin: 1.2 mg/dL (ref 0.3–1.2)
Total Protein: 6.4 g/dL — ABNORMAL LOW (ref 6.5–8.1)

## 2020-09-01 LAB — URINALYSIS, ROUTINE W REFLEX MICROSCOPIC
Bilirubin Urine: NEGATIVE
Glucose, UA: NEGATIVE mg/dL
Ketones, ur: NEGATIVE mg/dL
Nitrite: NEGATIVE
Protein, ur: NEGATIVE mg/dL
Specific Gravity, Urine: 1.018 (ref 1.005–1.030)
pH: 9 — ABNORMAL HIGH (ref 5.0–8.0)

## 2020-09-01 LAB — DIFFERENTIAL
Abs Immature Granulocytes: 0.02 10*3/uL (ref 0.00–0.07)
Basophils Absolute: 0 10*3/uL (ref 0.0–0.1)
Basophils Relative: 0 %
Eosinophils Absolute: 0.1 10*3/uL (ref 0.0–0.5)
Eosinophils Relative: 1 %
Immature Granulocytes: 0 %
Lymphocytes Relative: 24 %
Lymphs Abs: 1.4 10*3/uL (ref 0.7–4.0)
Monocytes Absolute: 0.4 10*3/uL (ref 0.1–1.0)
Monocytes Relative: 7 %
Neutro Abs: 3.9 10*3/uL (ref 1.7–7.7)
Neutrophils Relative %: 68 %

## 2020-09-01 LAB — POCT I-STAT 7, (LYTES, BLD GAS, ICA,H+H)
Acid-base deficit: 2 mmol/L (ref 0.0–2.0)
Bicarbonate: 23.4 mmol/L (ref 20.0–28.0)
Calcium, Ion: 1.18 mmol/L (ref 1.15–1.40)
HCT: 42 % (ref 36.0–46.0)
Hemoglobin: 14.3 g/dL (ref 12.0–15.0)
O2 Saturation: 100 %
Patient temperature: 97.7
Potassium: 3.6 mmol/L (ref 3.5–5.1)
Sodium: 138 mmol/L (ref 135–145)
TCO2: 25 mmol/L (ref 22–32)
pCO2 arterial: 42 mmHg (ref 32.0–48.0)
pH, Arterial: 7.351 (ref 7.350–7.450)
pO2, Arterial: 248 mmHg — ABNORMAL HIGH (ref 83.0–108.0)

## 2020-09-01 LAB — PROTIME-INR
INR: 1 (ref 0.8–1.2)
Prothrombin Time: 12.9 seconds (ref 11.4–15.2)

## 2020-09-01 LAB — URINE DRUG SCREEN, QUALITATIVE (ARMC ONLY)
Amphetamines, Ur Screen: NOT DETECTED
Barbiturates, Ur Screen: NOT DETECTED
Benzodiazepine, Ur Scrn: NOT DETECTED
Cannabinoid 50 Ng, Ur ~~LOC~~: NOT DETECTED
Cocaine Metabolite,Ur ~~LOC~~: NOT DETECTED
MDMA (Ecstasy)Ur Screen: NOT DETECTED
Methadone Scn, Ur: NOT DETECTED
Opiate, Ur Screen: NOT DETECTED
Phencyclidine (PCP) Ur S: NOT DETECTED
Tricyclic, Ur Screen: NOT DETECTED

## 2020-09-01 LAB — ECHOCARDIOGRAM COMPLETE
Area-P 1/2: 5.54 cm2
Height: 63 in
S' Lateral: 2.3 cm
Weight: 2400 oz

## 2020-09-01 LAB — MRSA PCR SCREENING: MRSA by PCR: NEGATIVE

## 2020-09-01 LAB — MAGNESIUM: Magnesium: 1.9 mg/dL (ref 1.7–2.4)

## 2020-09-01 LAB — APTT: aPTT: 36 seconds (ref 24–36)

## 2020-09-01 LAB — CBG MONITORING, ED: Glucose-Capillary: 132 mg/dL — ABNORMAL HIGH (ref 70–99)

## 2020-09-01 SURGERY — IR WITH ANESTHESIA
Anesthesia: General

## 2020-09-01 MED ORDER — CHLORHEXIDINE GLUCONATE CLOTH 2 % EX PADS
6.0000 | MEDICATED_PAD | Freq: Every day | CUTANEOUS | Status: DC
  Administered 2020-09-01 – 2020-09-05 (×4): 6 via TOPICAL

## 2020-09-01 MED ORDER — PROPOFOL 500 MG/50ML IV EMUL
INTRAVENOUS | Status: DC | PRN
  Administered 2020-09-01: 50 ug/kg/min via INTRAVENOUS

## 2020-09-01 MED ORDER — ONDANSETRON HCL 4 MG/2ML IJ SOLN: 4.0000 mg | Freq: Four times a day (QID) | INTRAMUSCULAR | Status: DC | PRN

## 2020-09-01 MED ORDER — NITROGLYCERIN 1 MG/10 ML FOR IR/CATH LAB
INTRA_ARTERIAL | Status: AC
  Filled 2020-09-01: qty 10

## 2020-09-01 MED ORDER — PROPOFOL 1000 MG/100ML IV EMUL
5.0000 ug/kg/min | INTRAVENOUS | Status: DC
  Administered 2020-09-01: 50 ug/kg/min via INTRAVENOUS

## 2020-09-01 MED ORDER — ACETAMINOPHEN 650 MG RE SUPP: 650.0000 mg | RECTAL | Status: DC | PRN

## 2020-09-01 MED ORDER — CLEVIDIPINE BUTYRATE 0.5 MG/ML IV EMUL
0.0000 mg/h | INTRAVENOUS | Status: DC
  Administered 2020-09-01: 4 mg/h via INTRAVENOUS
  Administered 2020-09-02: 6 mg/h via INTRAVENOUS
  Filled 2020-09-01 (×3): qty 100

## 2020-09-01 MED ORDER — SODIUM CHLORIDE (PF) 0.9 % IJ SOLN
INTRAVENOUS | Status: DC | PRN
  Administered 2020-09-01: 25 ug via INTRA_ARTERIAL

## 2020-09-01 MED ORDER — CEFAZOLIN SODIUM-DEXTROSE 2-3 GM-%(50ML) IV SOLR
INTRAVENOUS | Status: DC | PRN
  Administered 2020-09-01: 2 g via INTRAVENOUS

## 2020-09-01 MED ORDER — EPTIFIBATIDE 20 MG/10ML IV SOLN
INTRAVENOUS | Status: AC
  Filled 2020-09-01: qty 10

## 2020-09-01 MED ORDER — TIROFIBAN HCL IN NACL 5-0.9 MG/100ML-% IV SOLN
0.1500 ug/kg/min | INTRAVENOUS | Status: DC
  Administered 2020-09-01: 0.15 ug/kg/min via INTRAVENOUS
  Filled 2020-09-01: qty 100

## 2020-09-01 MED ORDER — ACETAMINOPHEN 160 MG/5ML PO SOLN: 650.0000 mg | ORAL | Status: DC | PRN

## 2020-09-01 MED ORDER — DOCUSATE SODIUM 100 MG PO CAPS: 100.0000 mg | ORAL_CAPSULE | Freq: Two times a day (BID) | ORAL | Status: DC | PRN

## 2020-09-01 MED ORDER — LIDOCAINE 2% (20 MG/ML) 5 ML SYRINGE
INTRAMUSCULAR | Status: DC | PRN
  Administered 2020-09-01: 60 mg via INTRAVENOUS

## 2020-09-01 MED ORDER — CANGRELOR TETRASODIUM 50 MG IV SOLR
INTRAVENOUS | Status: AC
  Filled 2020-09-01: qty 50

## 2020-09-01 MED ORDER — TICAGRELOR 90 MG PO TABS
ORAL_TABLET | ORAL | Status: AC
  Filled 2020-09-01: qty 2

## 2020-09-01 MED ORDER — STROKE: EARLY STAGES OF RECOVERY BOOK
Freq: Once | Status: AC
  Filled 2020-09-01: qty 1

## 2020-09-01 MED ORDER — IOHEXOL 300 MG/ML  SOLN
150.0000 mL | Freq: Once | INTRAMUSCULAR | Status: AC | PRN
  Administered 2020-09-01: 100 mL via INTRA_ARTERIAL

## 2020-09-01 MED ORDER — PANTOPRAZOLE SODIUM 40 MG PO TBEC: 40.0000 mg | DELAYED_RELEASE_TABLET | Freq: Every day | ORAL | Status: DC

## 2020-09-01 MED ORDER — PANTOPRAZOLE SODIUM 40 MG IV SOLR: 40.0000 mg | Freq: Every day | INTRAVENOUS | Status: DC

## 2020-09-01 MED ORDER — DEXAMETHASONE SODIUM PHOSPHATE 10 MG/ML IJ SOLN
INTRAMUSCULAR | Status: DC | PRN
  Administered 2020-09-01: 5 mg via INTRAVENOUS

## 2020-09-01 MED ORDER — CEFAZOLIN SODIUM-DEXTROSE 2-4 GM/100ML-% IV SOLN
INTRAVENOUS | Status: AC
  Filled 2020-09-01: qty 100

## 2020-09-01 MED ORDER — PROPOFOL 10 MG/ML IV BOLUS
INTRAVENOUS | Status: DC | PRN
  Administered 2020-09-01: 50 mg via INTRAVENOUS
  Administered 2020-09-01: 40 mg via INTRAVENOUS
  Administered 2020-09-01: 90 mg via INTRAVENOUS
  Administered 2020-09-01 (×2): 30 mg via INTRAVENOUS

## 2020-09-01 MED ORDER — ASPIRIN 81 MG PO CHEW
324.0000 mg | CHEWABLE_TABLET | Freq: Once | ORAL | Status: AC
  Administered 2020-09-01: 324 mg via ORAL
  Filled 2020-09-01: qty 4

## 2020-09-01 MED ORDER — TIROFIBAN HCL IN NACL 5-0.9 MG/100ML-% IV SOLN
0.1500 ug/kg/min | INTRAVENOUS | Status: DC
Start: 2020-09-01 — End: 2020-09-01

## 2020-09-01 MED ORDER — TIROFIBAN (AGGRASTAT) BOLUS VIA INFUSION: 25.0000 ug/kg | Freq: Once | INTRAVENOUS | Status: DC

## 2020-09-01 MED ORDER — POLYETHYLENE GLYCOL 3350 17 G PO PACK: 17.0000 g | PACK | Freq: Every day | ORAL | Status: DC | PRN

## 2020-09-01 MED ORDER — IOHEXOL 350 MG/ML SOLN
40.0000 mL | Freq: Once | INTRAVENOUS | Status: AC | PRN
  Administered 2020-09-01: 40 mL via INTRAVENOUS

## 2020-09-01 MED ORDER — IOHEXOL 350 MG/ML SOLN
75.0000 mL | Freq: Once | INTRAVENOUS | Status: AC | PRN
  Administered 2020-09-01: 75 mL via INTRAVENOUS

## 2020-09-01 MED ORDER — CLEVIDIPINE BUTYRATE 0.5 MG/ML IV EMUL
INTRAVENOUS | Status: AC
  Filled 2020-09-01: qty 50

## 2020-09-01 MED ORDER — ONDANSETRON HCL 4 MG/2ML IJ SOLN
INTRAMUSCULAR | Status: DC | PRN
  Administered 2020-09-01: 4 mg via INTRAVENOUS

## 2020-09-01 MED ORDER — DEXTROSE-NACL 5-0.45 % IV SOLN: INTRAVENOUS | Status: DC

## 2020-09-01 MED ORDER — ACETAMINOPHEN 325 MG PO TABS
650.0000 mg | ORAL_TABLET | ORAL | Status: DC | PRN
  Administered 2020-09-03 (×2): 650 mg via ORAL
  Filled 2020-09-01 (×3): qty 2

## 2020-09-01 MED ORDER — FENTANYL CITRATE (PF) 100 MCG/2ML IJ SOLN
50.0000 ug | INTRAMUSCULAR | Status: DC | PRN
  Administered 2020-09-01: 50 ug via INTRAVENOUS
  Filled 2020-09-01: qty 2

## 2020-09-01 MED ORDER — TIROFIBAN HCL IN NACL 5-0.9 MG/100ML-% IV SOLN
INTRAVENOUS | Status: AC
  Filled 2020-09-01: qty 100

## 2020-09-01 MED ORDER — PHENYLEPHRINE HCL-NACL 10-0.9 MG/250ML-% IV SOLN
INTRAVENOUS | Status: DC | PRN
  Administered 2020-09-01: 20 ug/min via INTRAVENOUS

## 2020-09-01 MED ORDER — SUCCINYLCHOLINE CHLORIDE 20 MG/ML IJ SOLN
INTRAMUSCULAR | Status: DC | PRN
  Administered 2020-09-01: 100 mg via INTRAVENOUS

## 2020-09-01 MED ORDER — ASPIRIN 81 MG PO CHEW
81.0000 mg | CHEWABLE_TABLET | Freq: Every day | ORAL | Status: DC
  Administered 2020-09-02 – 2020-09-04 (×3): 81 mg via ORAL
  Filled 2020-09-01 (×3): qty 1

## 2020-09-01 MED ORDER — TIROFIBAN HCL IN NACL 5-0.9 MG/100ML-% IV SOLN: 0.1500 ug/kg/min | INTRAVENOUS | Status: DC

## 2020-09-01 MED ORDER — IOHEXOL 300 MG/ML  SOLN
50.0000 mL | Freq: Once | INTRAMUSCULAR | Status: AC | PRN
  Administered 2020-09-01: 24 mL via INTRA_ARTERIAL

## 2020-09-01 MED ORDER — SODIUM CHLORIDE 0.9 % IV SOLN: INTRAVENOUS | Status: DC

## 2020-09-01 MED ORDER — ROCURONIUM BROMIDE 10 MG/ML (PF) SYRINGE
PREFILLED_SYRINGE | INTRAVENOUS | Status: DC | PRN
  Administered 2020-09-01 (×3): 50 mg via INTRAVENOUS

## 2020-09-01 MED ORDER — IOHEXOL 240 MG/ML SOLN
INTRAMUSCULAR | Status: AC
  Filled 2020-09-01: qty 200

## 2020-09-01 MED ORDER — TICAGRELOR 60 MG PO TABS
ORAL_TABLET | ORAL | Status: DC | PRN
  Administered 2020-09-01: 180 mg

## 2020-09-01 MED ORDER — ASPIRIN 81 MG PO CHEW
CHEWABLE_TABLET | ORAL | Status: AC
  Filled 2020-09-01: qty 1

## 2020-09-01 MED ORDER — CHLORHEXIDINE GLUCONATE 0.12% ORAL RINSE (MEDLINE KIT)
15.0000 mL | Freq: Two times a day (BID) | OROMUCOSAL | Status: DC
  Administered 2020-09-01: 15 mL via OROMUCOSAL

## 2020-09-01 MED ORDER — ASPIRIN 81 MG PO CHEW: 81.0000 mg | CHEWABLE_TABLET | Freq: Every day | ORAL | Status: DC

## 2020-09-01 MED ORDER — CLOPIDOGREL BISULFATE 300 MG PO TABS
ORAL_TABLET | ORAL | Status: AC
  Filled 2020-09-01: qty 1

## 2020-09-01 MED ORDER — CLEVIDIPINE BUTYRATE 0.5 MG/ML IV EMUL
INTRAVENOUS | Status: DC | PRN
  Administered 2020-09-01: 4 mg/h via INTRAVENOUS

## 2020-09-01 MED ORDER — ACETAMINOPHEN 325 MG PO TABS: 650.0000 mg | ORAL_TABLET | ORAL | Status: DC | PRN

## 2020-09-01 MED ORDER — SODIUM CHLORIDE 0.9 % IV SOLN: INTRAVENOUS | Status: DC | PRN

## 2020-09-01 MED ORDER — TICAGRELOR 90 MG PO TABS
90.0000 mg | ORAL_TABLET | Freq: Two times a day (BID) | ORAL | Status: DC
  Administered 2020-09-01: 90 mg

## 2020-09-01 MED ORDER — TIROFIBAN (AGGRASTAT) BOLUS VIA INFUSION
25.0000 ug/kg | Freq: Once | INTRAVENOUS | Status: DC
  Administered 2020-09-01: 1700 ug via INTRAVENOUS

## 2020-09-01 MED ORDER — TICAGRELOR 90 MG PO TABS
90.0000 mg | ORAL_TABLET | Freq: Two times a day (BID) | ORAL | Status: DC
  Administered 2020-09-02 – 2020-09-05 (×7): 90 mg via ORAL
  Filled 2020-09-01 (×8): qty 1

## 2020-09-01 MED ORDER — VERAPAMIL HCL 2.5 MG/ML IV SOLN
INTRAVENOUS | Status: AC
  Filled 2020-09-01: qty 2

## 2020-09-01 MED ORDER — SENNOSIDES-DOCUSATE SODIUM 8.6-50 MG PO TABS: 1.0000 | ORAL_TABLET | Freq: Every evening | ORAL | Status: DC | PRN

## 2020-09-01 MED ORDER — ORAL CARE MOUTH RINSE
15.0000 mL | OROMUCOSAL | Status: DC
  Administered 2020-09-01 (×2): 15 mL via OROMUCOSAL

## 2020-09-01 NOTE — Consult Note (Signed)
Neurology Consultation  Reason for Consult: Code stroke Referring Physician: Dr. Charna Archer  CC: Right-sided weakness and numbness  History is obtained from: Patient, chart  HPI: Deanna Williams is a 87 y.o. female past history of hypertension, hyperlipidemia, presented to the emergency room with right-sided numbness and weakness.  Her last known well was when she went to bed around 10 to 10:30 PM.  When she woke up this morning she noted that her right arm and leg are heavy and feel numb.  She also noted some weakness on the right side.  She was brought to the hospital for evaluation of those symptoms.  She also noticed some right facial numbness around her mouth and lips. Her symptoms of not improved since arrival.  A code stroke was activated around 7 AM, seen by telemedicine neurology, found to have a small hypodensity in the left PCA territory, deemed to be outside the window for IV tPA and further recommendations for vessel imaging were given. I was called after the CT angiogram of the head and neck was completed that showed a left P2 occlusion for further recommendations. Patient was given aspirin 325 at 8:04 AM. I reexamined the patient-see my detailed exam below. Given that she is a modified Rankin score of 0 at baseline and has disabling symptoms, I discussed this with the neuro interventional radiology-Dr. Estanislado Pandy who suggested that we do a CT perfusion study to ensure that there is not a large score already established. CT perfusion study was completed with no core and a penumbra that included the left PCA territory as well as bilateral cerebelli of total 42 cc. Case discussed with neuro interventional radiology Dr. Estanislado Pandy again and the patient.  Endovascular thrombectomy option offered along with risk-benefit discussion and the patient agreed. Transfer made to [place] accepting physician to her service.  LKW: 10 PM on 09/01/2020 tpa given?: no, outside the  window-determination has been made by telemedicine neurology already Premorbid modified Rankin scale (mRS): 0  ROS: Full ROS was performed and is negative except as noted in the HPI.  Past Medical History:  Diagnosis Date  . Gastroesophageal reflux disease   . Hyperlipidemia   . Hypertension   . Lichen sclerosus   . Lichen sclerosus et atrophicus   . Osteopenia   . Palpitations     Family History  Problem Relation Age of Onset  . Cancer Mother   . Leukemia Mother   . Cancer Father        colon  . Hypertension Father   . Arthritis Sister   . Breast cancer Neg Hx      Social History:   reports that she has never smoked. She has never used smokeless tobacco. She reports current alcohol use. She reports that she does not use drugs.  Medications No current facility-administered medications for this encounter.  Current Outpatient Medications:  .  clobetasol ointment (TEMOVATE) 6.76 %, Apply 1 application topically 2 (two) times daily. (Patient not taking: No sig reported), Disp: , Rfl:  .  conjugated estrogens (PREMARIN) vaginal cream, Place 1 Applicatorful vaginally daily., Disp: 42.5 g, Rfl: 12 .  Omega-3 Fatty Acids (FISH OIL) 1000 MG CPDR, Take by mouth., Disp: , Rfl:  .  omeprazole (PRILOSEC) 20 MG capsule, Take 1 capsule (20 mg total) by mouth daily., Disp: 30 capsule, Rfl: 3 .  terconazole (TERAZOL 3) 0.8 % vaginal cream, Place 1 applicator vaginally at bedtime. (Patient not taking: Reported on 07/20/2020), Disp: 20 g, Rfl:  3 .  vitamin B-12 (CYANOCOBALAMIN) 1000 MCG tablet, Take 1,000 mcg by mouth daily., Disp: , Rfl:  .  Vitamin D, Cholecalciferol, 50 MCG (2000 UT) CAPS, Take by mouth., Disp: , Rfl:    Exam: Current vital signs: BP (!) 183/82   Pulse 74   Temp 98.3 F (36.8 C) (Oral)   Resp 18   Ht 5\' 3"  (1.6 m)   Wt 68 kg   SpO2 97%   BMI 26.57 kg/m  Vital signs in last 24 hours: Temp:  [98.3 F (36.8 C)] 98.3 F (36.8 C) (04/19 0643) Pulse Rate:  [72-76]  74 (04/19 0800) Resp:  [13-18] 18 (04/19 0800) BP: (146-206)/(81-117) 183/82 (04/19 0800) SpO2:  [94 %-97 %] 97 % (04/19 0800) Weight:  [68 kg] 68 kg (04/19 0643)  GENERAL: Awake, alert in NAD HEENT: - Normocephalic and atraumatic, dry mm, no LN++, no Thyromegally LUNGS - Clear to auscultation bilaterally with no wheezes CV - S1S2 RRR, no m/r/g, equal pulses bilaterally. ABDOMEN - Soft, nontender, nondistended with normoactive BS Ext: warm, well perfused, intact peripheral pulses, no edema  NEURO:  Mental Status: AA&Ox3  Language: speech is clear without evidence of dysarthria.  Naming, repetition, fluency, and comprehension intact. Cranial Nerves: PERRL. EOMI, visual fields full, no facial asymmetry, facial sensation diminished on the right, hearing intact, tongue/uvula/soft palate midline, normal sternocleidomastoid and trapezius muscle strength. No evidence of tongue atrophy or fibrillations Motor: Right upper extremity with drift 4/5.  Right lower extremity with drift 4+/5.  Left upper and lower extremity without drift. Tone: is normal and bulk is normal Sensation-diminished on the right face arm and leg in comparison to the left Coordination: Ataxic in both right upper and lower extremity disproportionate to the weakness Gait- deferred  NIHSS 1a Level of Conscious.: 0 1b LOC Questions: 0 1c LOC Commands: 0 2 Best Gaze: 0 3 Visual: 0 4 Facial Palsy: 0 5a Motor Arm - left: 0 5b Motor Arm - Right: 1 6a Motor Leg - Left: 0 6b Motor Leg - Right: 1 7 Limb Ataxia: 2 8 Sensory: 2 9 Best Language: 0 10 Dysarthria: 0 11 Extinct. and Inatten.: 0 TOTAL: 6    Labs I have reviewed labs in epic and the results pertinent to this consultation are:  CBC    Component Value Date/Time   WBC 5.7 09/01/2020 0654   RBC 4.50 09/01/2020 0654   HGB 14.1 09/01/2020 0654   HGB 14.4 01/21/2020 1041   HCT 42.2 09/01/2020 0654   HCT 41.3 01/21/2020 1041   PLT 256 09/01/2020 0654   PLT  233 01/21/2020 1041   MCV 93.8 09/01/2020 0654   MCV 93 01/21/2020 1041   MCH 31.3 09/01/2020 0654   MCHC 33.4 09/01/2020 0654   RDW 12.3 09/01/2020 0654   RDW 12.2 01/21/2020 1041   LYMPHSABS 1.4 09/01/2020 0654   LYMPHSABS 1.6 01/21/2020 1041   MONOABS 0.4 09/01/2020 0654   EOSABS 0.1 09/01/2020 0654   EOSABS 0.1 01/21/2020 1041   BASOSABS 0.0 09/01/2020 0654   BASOSABS 0.0 01/21/2020 1041    CMP     Component Value Date/Time   NA 137 09/01/2020 0654   NA 139 01/21/2020 1041   K 4.7 09/01/2020 0654   CL 107 09/01/2020 0654   CO2 23 09/01/2020 0654   GLUCOSE 122 (H) 09/01/2020 0654   BUN 12 09/01/2020 0654   BUN 14 01/21/2020 1041   CREATININE 0.72 09/01/2020 0654   CREATININE 0.82 03/07/2017 0855  CALCIUM 9.0 09/01/2020 0654   PROT 6.4 (L) 09/01/2020 0654   PROT 6.2 01/21/2020 1041   ALBUMIN 3.7 09/01/2020 0654   ALBUMIN 4.1 01/21/2020 1041   AST 24 09/01/2020 0654   ALT 12 09/01/2020 0654   ALKPHOS 75 09/01/2020 0654   BILITOT 1.2 09/01/2020 0654   BILITOT 0.4 01/21/2020 1041   GFRNONAA >60 09/01/2020 0654   GFRNONAA 65 03/07/2017 0855   GFRAA 66 01/21/2020 1041   GFRAA 75 03/07/2017 0855    Imaging I have reviewed the images obtained:  CT-scan of the brain- Small acute appearing infarct in the left occipital parietal cortex with adjacent hyperdense PCA CT angiogram head and neck confirms the occlusion of the left PCA at the left P2 segment with downstream reconstitution followed by severe stenosis of the left PCA bifurcation.  Intracranial atherosclerosis with a notable right A2, left M2 and left V4 segment stenosis.  Atherosclerosis in the neck without flow-limiting stenosis. CT perfusion-left PCA penumbra with no core-penumbra reported to be 42 cc but that includes bilateral cerebellar tissue so the actual penumbra might be slightly smaller but there is absolutely no core.   Assessment:  87 year old with past history of hypertension hyperlipidemia  presenting with right-sided numbness and weakness along with right-sided ataxia on exam with an NIH stroke scale of 6. Symptoms consistent with a left PCA stroke. A left PCA occlusion at the P2 segment identified on the CTA. CT perfusion done at the discussion with the interventionalist that shows no core with a moderately large penumbra to say. Given her baseline independent status, and potentially disabling stroke, intervention option discussed and patient agreed after discussion of risks and benefits on the phone with the interventionalist, neurologist and the patient. Since the patient did not have tPA on board due to being outside the window, she was transported to [facility] via [place], to be admitted to the neurology service at [facility] after intervention with Dr. Estanislado Pandy.  Dr. Estanislado Pandy discussed with the patient and the intervention, the risks, the benefits and the potential for possible stenting if there is narrowing of the vessel that is not amenable to opening without stent.  Patient agreed and consented.  Impression: Acute ischemic stroke- atheroembolic versus cardioembolic  Recommendations: Stat transfer to [facility] for EVT Both the IR team and the neurology team at [facility] not aware. Dr. Curly Shores is accepting neuro hospitalist at [facility]. Patient has already been given aspirin 325. Postintervention care per IR and admitting neurology team. Also talked to patient's granddaughter in law over the phone and explained the procedure and the fact that she will be going to [facility] in [place].  Answered all questions. Discussed the plan with Dr. Charna Archer in the emergency room.  -- Amie Portland, MD Neurologist [facility] Pager: [phone]   CRITICAL CARE ATTESTATION Performed by: Amie Portland, MD Total critical care time: 60 minutes Critical care time was exclusive of separately billable procedures and treating  other patients and/or supervising APPs/Residents/Students Critical care was necessary to treat or prevent imminent or life-threatening deterioration due to acute ischemic stroke, evaluation for endovascular thrombectomy This patient is critically ill and at significant risk for neurological worsening and/or death and care requires constant monitoring. Critical care was time spent personally by me on the following activities: development of treatment plan with patient and/or surrogate as well as nursing, discussions with consultants, evaluation of patient's response to treatment, examination of patient, obtaining history from patient or surrogate,  ordering and performing treatments and interventions, ordering and review of laboratory studies, ordering and review of radiographic studies, pulse oximetry, re-evaluation of patient's condition, participation in multidisciplinary rounds and medical decision making of high complexity in the care of this patient.

## 2020-09-01 NOTE — Progress Notes (Signed)
Pt transported to CT & back to room 4N20 on ventilator with no problems. Was transported by fellow RRT Caryl Pina.

## 2020-09-01 NOTE — Anesthesia Procedure Notes (Signed)
Arterial Line Insertion Start/End4/19/2022 9:40 AM, 09/01/2020 9:41 AM Performed by: Inda Coke, CRNA, CRNA  Preanesthetic checklist: patient identified, IV checked, site marked, risks and benefits discussed, surgical consent, monitors and equipment checked, pre-op evaluation, timeout performed and anesthesia consent Patient sedated Left, radial was placed Catheter size: 20 G Hand hygiene performed  and maximum sterile barriers used  Allen's test indicative of satisfactory collateral circulation Attempts: 1 Procedure performed without using ultrasound guided technique. Following insertion, dressing applied and Biopatch. Post procedure assessment: normal  Patient tolerated the procedure well with no immediate complications.

## 2020-09-01 NOTE — ED Notes (Signed)
Pt in route to CT

## 2020-09-01 NOTE — ED Notes (Signed)
Report to [facility] [employer].

## 2020-09-01 NOTE — Anesthesia Postprocedure Evaluation (Signed)
Anesthesia Post Note  Patient: Deanna Williams  Procedure(s) Performed: IR WITH ANESTHESIA (N/A )     Patient location during evaluation: SICU Anesthesia Type: General Level of consciousness: sedated Pain management: pain level controlled Vital Signs Assessment: post-procedure vital signs reviewed and stable Respiratory status: patient remains intubated per anesthesia plan Cardiovascular status: stable Postop Assessment: no apparent nausea or vomiting Anesthetic complications: no   No complications documented.  Last Vitals:  Vitals:   09/01/20 1249 09/01/20 1319  BP: 137/66 133/60  Pulse: 80 86  Resp: 12 16  Temp:  36.5 C  SpO2: 100% 100%    Last Pain: There were no vitals filed for this visit.               Catalina Gravel

## 2020-09-01 NOTE — Consult Note (Addendum)
NAME:  Deanna Williams, MRN:  [id number], DOB:  09-22-1928, LOS: 0 ADMISSION DATE:  09/01/2020, CONSULTATION DATE:  09/01/2020 REFERRING MD:  Estanislado Pandy, CHIEF COMPLAINT:  L PCA occlusion, s/p stent/ aggrastat infusion   History of Present Illness:  87 y.o. female past history of hypertension, hyperlipidemia, presented to the emergency room at [facility] 09/01/2020 with right-sided numbness and weakness.  Her last known well was when she went to bed around 10 to 10:30 PM.  When she woke up this morning she noted that her right arm and leg are heavy and feel numb.  She also noted some weakness on the right side.  She was brought to the hospital for evaluation of those symptoms early am on  4/19.  She also noticed some right facial numbness around her mouth and lips. Her symptoms of not improved since arrival.  A code stroke was activated around 7 AM, seen by telemedicine neurology, found to have a small hypodensity in the left PCA territory, deemed to be outside the window for IV tPA and further recommendations for vessel imaging were given. CT angiogram of the head and neck was completed that showed a left P2 occlusion .Patient was given aspirin 325 at 8:04 AM.She had a modified Rankin score of 0 at baseline, and she had disabling symptoms, Dr. Rory Percy discussed this with the neuro interventional radiology-Dr. Estanislado Pandy who suggested that patient go for  a CT perfusion study to ensure that there is not a large core already established. CT perfusion study was completed with no core and a penumbra that included the left PCA territory as well as bilateral cerebelli of total 42 cc. Case discussed with neuro interventional radiology Dr. Estanislado Pandy again and the patient.  Endovascular thrombectomy option offered along with risk-benefit discussion and the patient agreed. She was transferred to [facility] 4/19 and underwent cerebral angiogram for occlusion of left posterior cerebral artery/ IR thrombectomy. S/P RT VA angiogram  followed bt revascularization of occluded P1 Lt PCA with x 1 pass with 22mm x 40 mm solitaire X retriever and aspiration achieving a TICI 3 revascularization followed by placement of a rescue stent for re occlusion due to underlying ICAD achieving a TICI 5 recvascularization .  Slow flow in the stent requiring bolus of aggrastat and IV infusion for 4 hrs. Additionally she was given ASA 81 mg  and   brilinta 180 mg via OG tube.  Post CT  66F angioseal for hemostasis in the RT groin.. Distal pulses all present in both feet . Patient remained  Intubated for admission to ICCU.  PCCM have been asked to assist with management of ventilator and general ICU care     Pertinent  Medical History  hypertension, hyperlipidemia, GERD  Significant Hospital Events: Including procedures, antibiotic start and stop dates in addition to other pertinent events   .  LKW: 10 PM on 08/31/2020 tpa given?: no, outside the window-determination has been made by telemedicine neurology already Premorbid modified Rankin scale (mRS): 0 Interim History / Subjective:  Intubated on 100% in PACU. Pt is not responsive to stimulation on my exam>> she is on Propofol PERRLA 3 mm and brisk All pulses palpable/ doppler able Net + 1700 cc's On Cleviprex gtt>> BP 141/54 L Aline, PIV x 2 on L  Objective   There were no vitals taken for this visit.        Intake/Output Summary (Last 24 hours) at 09/01/2020 1221 Last data filed at 09/01/2020 1051 Gross per 24 hour  Intake  800 ml  Output 20 ml  Net 780 ml   There were no vitals filed for this visit.  Examination: General: Unresponsive elderly female , intubated and mechanically ventilated, In NAD HENT: NCAT, ETT is secure and intact, No LAD, No JVD, PERRLA  MM pink and dry Lungs: Bilateral chest excursion, coarse throughout with rhonchi, diminished per bases, Not assisting vent on rate of 12 Cardiovascular: S1, S2, RRR, No RMG Abdomen: Soft, NT, ND, BS  diminished Extremities: No obvious deformities, warm to touch with brisk capillary refill, pulses palpable or dopplerable Neuro: Unresponsive to stimulation, no continuous sedation, PERRLA 3 mm and brisk, GU: Dry Wick no UO since returned from OR ( 30 minutes)  Labs/imaging that I havepersonally reviewed  (right click and "Reselect all SmartList Selections" daily)  4/19 CT Cerebral Perfusion w Contrast Left PCA penumbra which is smaller than the reported 42 mL mismatch volume as that also includes bilateral cerebellar tissue  4/19 CT Head w/o contrast No substantial change in small acute infarct in the left occipital parietal cortex. No progressive mass effect Subtle/faint cortical versus extra-axial hyperdensity in the posteroinferior right temporal and occipital lobes, which could represent contrast staining given recent catheter arteriogram versus trace hemorrhage. No mass occupying acute hemorrhage. Recommend attention on close follow-up. 09/01/2020 CT Head and Neck w and w/o Contrast Confirmed left PCA occlusion at the left P2 segment with downstream reconstitution followed by a severe stenosis at the left PCA bifurcation. 2. Intracranial atherosclerosis with notable right A2, left M2, and left V4 segment stenoses. 3. Atherosclerosis in the neck without flow limiting stenosis. 4. Left brachiocephalic vein stenosis.   CMP Latest Ref Rng & Units 09/01/2020 01/21/2020 04/10/2019  Glucose 70 - 99 mg/dL 122(H) 100(H) 108(H)  BUN 8 - 23 mg/dL 12 14 11   Creatinine 0.44 - 1.00 mg/dL 0.72 0.90 0.90  Sodium 135 - 145 mmol/L 137 139 138  Potassium 3.5 - 5.1 mmol/L 4.7 4.5 4.7  Chloride 98 - 111 mmol/L 107 104 101  CO2 22 - 32 mmol/L 23 21 19(L)  Calcium 8.9 - 10.3 mg/dL 9.0 9.8 9.9  Total Protein 6.5 - 8.1 g/dL 6.4(L) 6.2 6.7  Total Bilirubin 0.3 - 1.2 mg/dL 1.2 0.4 0.5  Alkaline Phos 38 - 126 U/L 75 74 72  AST 15 - 41 U/L 24 17 17   ALT 0 - 44 U/L 12 10 12    CBC    Component  Value Date/Time   WBC 5.7 09/01/2020 0654   RBC 4.50 09/01/2020 0654   HGB 14.1 09/01/2020 0654   HGB 14.4 01/21/2020 1041   HCT 42.2 09/01/2020 0654   HCT 41.3 01/21/2020 1041   PLT 256 09/01/2020 0654   PLT 233 01/21/2020 1041   MCV 93.8 09/01/2020 0654   MCV 93 01/21/2020 1041   MCH 31.3 09/01/2020 0654   MCHC 33.4 09/01/2020 0654   RDW 12.3 09/01/2020 0654   RDW 12.2 01/21/2020 1041   LYMPHSABS 1.4 09/01/2020 0654   LYMPHSABS 1.6 01/21/2020 1041   MONOABS 0.4 09/01/2020 0654   EOSABS 0.1 09/01/2020 0654   EOSABS 0.1 01/21/2020 1041   BASOSABS 0.0 09/01/2020 0654   BASOSABS 0.0 01/21/2020 1041    Resolved Hospital Problem list     Assessment & Plan:  L PCA occlusion, s/p stent/ aggrastat infusion 09/01/2020 IR Thrombectomy Plan Per Primary Neuro Team ? IR Continue Cleviprex gtt  BP goal per IR ( 120-140 mm Hg) Frequent neuro checks Propofol for RASS of -1 until repeat  CT Head 4/19 afternoon  Agrastat infusion initiated at 11:25 am 4/19 ( Will need to stop at 15:25 pm 4/19) Initiated after sluggish flow through stent Plan No invasive procedures  Monitor for bleeding Trend CBC Brilinta and ASA post follow up CT per Neuro  Respiratory Insufficieny in post-operative setting, remaining intubated after Thrombectomy for Plan - Full vent support for now - Will need CXR upon arrival to 4N, and prn - Neuro status is is barrier to weaning and extubation at present - ABG 30 minutes after arrival to 4 N - SBT once neuro status allows  Nutrition Plan Place Cor Track Wednesday Dietary consult for TF  Creatinine 0.9 on admission Plan Trend BMET Replete electrolytes as needed Trend UO  Elevated total Cholesterol/ LDL Plan Statin initiation per Neuro team Trend Triglycerides    Will need to initiate goals of care conversation  if patient does not improve over the next 72 hours.  Best practice (right click and "Reselect all SmartList Selections" daily)  Diet:   NPO Pain/Anxiety/Delirium protocol (if indicated): No VAP protocol (if indicated): Yes DVT prophylaxis: SCD GI prophylaxis: H2B Glucose control:  SSI Yes Central venous access:  N/A Arterial line:  Yes, and it is still needed Foley:  N/A Mobility:  bed rest  PT consulted: Yes Last date of multidisciplinary goals of care discussion [pending] Code Status:  full code Disposition: Neuro ICU  Labs   CBC: Recent Labs  Lab 09/01/20 0654  WBC 5.7  NEUTROABS 3.9  HGB 14.1  HCT 42.2  MCV 93.8  PLT 109    Basic Metabolic Panel: Recent Labs  Lab 09/01/20 0654  NA 137  K 4.7  CL 107  CO2 23  GLUCOSE 122*  BUN 12  CREATININE 0.72  CALCIUM 9.0   GFR: Estimated Creatinine Clearance: 44.1 mL/min (by C-G formula based on SCr of 0.72 mg/dL). Recent Labs  Lab 09/01/20 0654  WBC 5.7    Liver Function Tests: Recent Labs  Lab 09/01/20 0654  AST 24  ALT 12  ALKPHOS 75  BILITOT 1.2  PROT 6.4*  ALBUMIN 3.7   No results for input(s): LIPASE, AMYLASE in the last 168 hours. No results for input(s): AMMONIA in the last 168 hours.  ABG No results found for: PHART, PCO2ART, PO2ART, HCO3, TCO2, ACIDBASEDEF, O2SAT   Coagulation Profile: Recent Labs  Lab 09/01/20 0654  INR 1.0    Cardiac Enzymes: No results for input(s): CKTOTAL, CKMB, CKMBINDEX, TROPONINI in the last 168 hours.  HbA1C: Hemoglobin A1C  Date/Time Value Ref Range Status  07/20/2020 08:32 AM 5.9 (A) 4.0 - 5.6 % Final  05/02/2018 04:00 PM 6.1 (A) 4.0 - 5.6 % Final   Hgb A1c MFr Bld  Date/Time Value Ref Range Status  01/21/2020 10:41 AM 6.3 (H) 4.8 - 5.6 % Final    Comment:             Prediabetes: 5.7 - 6.4          Diabetes: >6.4          Glycemic control for adults with diabetes: <7.0   07/30/2015 08:49 AM 6.1 (H) 4.8 - 5.6 % Final    Comment:             Pre-diabetes: 5.7 - 6.4          Diabetes: >6.4          Glycemic control for adults with diabetes: <7.0     CBG: No results for  input(s): GLUCAP  in the last 168 hours.  Review of Systems:   Unable as patient is intubated and  sedated  Past Medical History:  She,  has a past medical history of Gastroesophageal reflux disease, Hyperlipidemia, Hypertension, Lichen sclerosus, Lichen sclerosus et atrophicus, Osteopenia, and Palpitations.   Surgical History:   Past Surgical History:  Procedure Laterality Date  . CARPAL TUNNEL RELEASE    . FOOT SURGERY    . HYSTEROSCOPY WITH D & C N/A 11/02/2015   Procedure: DILATATION AND CURETTAGE /HYSTEROSCOPY;  Surgeon: Brayton Mars, MD;  Location: [facility] ORS;  Service: Gynecology;  Laterality: N/A;     Social History:   reports that she has never smoked. She has never used smokeless tobacco. She reports current alcohol use. She reports that she does not use drugs.   Family History:  Her family history includes Arthritis in her sister; Cancer in her father and mother; Hypertension in her father; Leukemia in her mother. There is no history of Breast cancer.   Allergies No Known Allergies   Home Medications  Prior to Admission medications   Medication Sig Start Date End Date Taking? Authorizing Provider  clobetasol ointment (TEMOVATE) 6.31 % Apply 1 application topically 2 (two) times daily. Patient not taking: No sig reported    [provider]  conjugated estrogens (PREMARIN) vaginal cream Place 1 Applicatorful vaginally daily. 06/29/20   Jerrol Banana., MD  Omega-3 Fatty Acids (FISH OIL) 1000 MG CPDR Take by mouth.    [provider]  omeprazole (PRILOSEC) 20 MG capsule Take 1 capsule (20 mg total) by mouth daily. 06/29/20   Jerrol Banana., MD  terconazole (TERAZOL 3) 0.8 % vaginal cream Place 1 applicator vaginally at bedtime. Patient not taking: Reported on 07/20/2020 01/21/20   Jerrol Banana., MD  vitamin B-12 (CYANOCOBALAMIN) 1000 MCG tablet Take 1,000 mcg by mouth daily.    [provider]  Vitamin D, Cholecalciferol,  50 MCG (2000 UT) CAPS Take by mouth.    [provider]     Critical care time: 13 minutes    Magdalen Spatz, MSN, AGACNP-BC Irene for personal pager PCCM on call pager [phone] 09/01/2020' 1:43 PM

## 2020-09-01 NOTE — ED Provider Notes (Signed)
[facility] Emergency Department Provider Note   ____________________________________________   Event Date/Time   First MD Initiated Contact with Patient 09/01/20 [phone]     (approximate)  I have reviewed the triage vital signs and the nursing notes.   HISTORY  Chief Complaint Weakness    HPI Deanna Williams is a 87 y.o. female with past medical history of hypertension, hyperlipidemia, and GERD who presents to the ED complaining of weakness.  Patient reports at 10 PM last night she started to have a feeling of numbness in her right arm along with some weakness and a heavy feeling.  She reports this affects her right leg as well but is milder than in her right arm.  She has not noticed any facial droop but does report a feeling of numbness over the right side of her face.  She has not had any vision changes or speech changes.  Symptoms have been constant since onset.  She is otherwise felt well recently with no fevers, cough, chest pain, or shortness of breath.        Past Medical History:  Diagnosis Date  . Gastroesophageal reflux disease   . Hyperlipidemia   . Hypertension   . Lichen sclerosus   . Lichen sclerosus et atrophicus   . Osteopenia   . Palpitations     Patient Active Problem List   Diagnosis Date Noted  . Hyperglycemia 08/31/2016  . Endometrial polyp 11/11/2015  . Lichen sclerosus 16/05/930  . Osteoporosis 08/11/2015  . Allergic rhinitis 04/14/2015  . Blood pressure elevated 04/14/2015  . 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 Acid reflux 04/14/2015  . 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 Hypercholesterolemia 04/14/2015  . BP (high blood pressure) 04/14/2015  . Arthritis, degenerative 04/14/2015  . Osteopenia 04/14/2015  . Awareness of heartbeats 04/14/2015    Past Surgical History:  Procedure Laterality Date  . CARPAL TUNNEL RELEASE    . FOOT SURGERY    . HYSTEROSCOPY WITH D & C N/A 11/02/2015   Procedure: DILATATION AND CURETTAGE  /HYSTEROSCOPY;  Surgeon: Brayton Mars, MD;  Location: [facility] ORS;  Service: Gynecology;  Laterality: N/A;    Prior to Admission medications   Medication Sig Start Date End Date Taking? Authorizing Provider  clobetasol ointment (TEMOVATE) 3.55 % Apply 1 application topically 2 (two) times daily. Patient not taking: No sig reported    [provider]  conjugated estrogens (PREMARIN) vaginal cream Place 1 Applicatorful vaginally daily. 06/29/20   Jerrol Banana., MD  Omega-3 Fatty Acids (FISH OIL) 1000 MG CPDR Take by mouth.    [provider]  omeprazole (PRILOSEC) 20 MG capsule Take 1 capsule (20 mg total) by mouth daily. 06/29/20   Jerrol Banana., MD  terconazole (TERAZOL 3) 0.8 % vaginal cream Place 1 applicator vaginally at bedtime. Patient not taking: Reported on 07/20/2020 01/21/20   Jerrol Banana., MD  vitamin B-12 (CYANOCOBALAMIN) 1000 MCG tablet Take 1,000 mcg by mouth daily.    [provider]  Vitamin D, Cholecalciferol, 50 MCG (2000 UT) CAPS Take by mouth.    [provider]    Allergies Patient has no known allergies.  Family History  Problem Relation Age of Onset  . Cancer Mother   . Leukemia Mother   . Cancer Father        colon  . Hypertension Father   . Arthritis Sister   . Breast cancer Neg Hx     Social History Social History  Tobacco Use  . Smoking status: Never Smoker  . Smokeless tobacco: Never Used  Vaping Use  . Vaping Use: Never used  Substance Use Topics  . Alcohol use: Yes    Alcohol/week: 0.0 standard drinks    Comment: rare- wine  . Drug use: No    Review of Systems  Constitutional: No fever/chills Eyes: No visual changes. ENT: No sore throat. Cardiovascular: Denies chest pain. Respiratory: Denies shortness of breath. Gastrointestinal: No abdominal pain.  No nausea, no vomiting.  No diarrhea.  No constipation. Genitourinary: Negative for dysuria. Musculoskeletal: Negative for  back pain. Skin: Negative for rash. Neurological: Negative for headaches, positive for right-sided numbness and weakness.  ____________________________________________   PHYSICAL EXAM:  VITAL SIGNS: ED Triage Vitals [09/01/20 0643]  Enc Vitals Group     BP (!) 146/117     Pulse Rate 72     Resp 18     Temp 98.3 F (36.8 C)     Temp Source Oral     SpO2 94 %     Weight 150 lb (68 kg)     Height 5\' 3"  (1.6 m)     Head Circumference      Peak Flow      Pain Score 0     Pain Loc      Pain Edu?      Excl. in [place]?     Constitutional: Alert and oriented. Eyes: Conjunctivae are normal. Head: Atraumatic. Nose: No congestion/rhinnorhea. Mouth/Throat: Mucous membranes are moist. Neck: Normal ROM Cardiovascular: Normal rate, regular rhythm. Grossly normal heart sounds. Respiratory: Normal respiratory effort.  No retractions. Lungs CTAB. Gastrointestinal: Soft and nontender. No distention. Genitourinary: deferred Musculoskeletal: No lower extremity tenderness nor edema. Neurologic:  Normal speech and language.  4 out of 5 strength in right upper and lower extremity with pronator drift, dysmetria noted with finger-to-nose testing on right.  No facial droop noted. Skin:  Skin is warm, dry and intact. No rash noted. Psychiatric: Mood and affect are normal. Speech and behavior are normal.  ____________________________________________   LABS (all labs ordered are listed, but only abnormal results are displayed)  Labs Reviewed  COMPREHENSIVE METABOLIC PANEL - Abnormal; Notable for the following components:      Result Value   Glucose, Bld 122 (*)    Total Protein 6.4 (*)    All other components within normal limits  URINALYSIS, ROUTINE W REFLEX MICROSCOPIC - Abnormal; Notable for the following components:   Color, Urine STRAW (*)    APPearance CLEAR (*)    pH 9.0 (*)    Hgb urine dipstick MODERATE (*)    Leukocytes,Ua MODERATE (*)    Bacteria, UA RARE (*)    All other  components within normal limits  RESP PANEL BY RT-PCR (FLU A&B, COVID) ARPGX2  PROTIME-INR  APTT  CBC  DIFFERENTIAL  URINE DRUG SCREEN, QUALITATIVE ([facility] ONLY)  ETHANOL   ____________________________________________  EKG  ED ECG REPORT I, Blake Divine, the attending physician, personally viewed and interpreted this ECG.   Date: 09/01/2020  EKG Time: 7:07  Rate: 74  Rhythm: normal sinus rhythm  Axis: Normal  Intervals:none  ST&T Change: None   PROCEDURES  Procedure(s) performed (including Critical Care):  .Critical Care Performed by: Blake Divine, MD Authorized by: Blake Divine, MD   Critical care provider statement:    Critical care time (minutes):  45   Critical care time was exclusive of:  Separately billable procedures and treating other patients and teaching time  Critical care was necessary to treat or prevent imminent or life-threatening deterioration of the following conditions:  CNS failure or compromise   Critical care was time spent personally by me on the following activities:  Discussions with consultants, evaluation of patient's response to treatment, examination of patient, ordering and performing treatments and interventions, ordering and review of laboratory studies, ordering and review of radiographic studies, pulse oximetry, re-evaluation of patient's condition, obtaining history from patient or surrogate and review of old charts   I assumed direction of critical care for this patient from another provider in my specialty: no     Care discussed with: accepting provider at another facility       ____________________________________________   INITIAL IMPRESSION / Bent / ED COURSE       87 year old female with past medical history of hypertension, hyperlipidemia, and GERD who presents to the ED complaining of cute onset right-sided numbness and weakness around 10 PM last night.  Code stroke was called and CT scan performed,  concerning for possible left PCA territory stroke.  She is outside the window for thrombolytics but we will further assess with CTA for large vessel occlusion.  CTA is positive for acute PCA occlusion, case discussed with telemetry neurologist who recommends getting in touch with neuro interventional radiology.  Case discussed with Dr. Estanislado Pandy of neuro interventional radiology at [facility], who recommends involving in person neurology here at [facility] now that they are available.  Patient evaluated by Dr. Rory Percy by neurology, who has further discussed with neuro interventional radiology and neurology at [facility], patient to be transferred for intervention on PCA occlusion.  She remains in stable condition at this time.      ____________________________________________   FINAL CLINICAL IMPRESSION(S) / ED DIAGNOSES  Final diagnoses:  Cerebrovascular accident (CVA), unspecified mechanism [facility])     ED Discharge Orders    None       Note:  This document was prepared using Dragon voice recognition software and may include unintentional dictation errors.   Blake Divine, MD 09/01/20 [phone]

## 2020-09-01 NOTE — Anesthesia Preprocedure Evaluation (Addendum)
Anesthesia Evaluation  Patient identified by MRN, date of birth, ID band  Reviewed: Allergy & Precautions, Patient's Chart, lab work & pertinent test results, Unable to perform ROS - Chart review onlyPreop documentation limited or incomplete due to emergent nature of procedure.  Airway Mallampati: II  TM Distance: >3 FB Neck ROM: Full    Dental  (+) Dental Advisory Given, Missing   Pulmonary neg pulmonary ROS,    breath sounds clear to auscultation       Cardiovascular hypertension, Normal cardiovascular exam Rhythm:Regular Rate:Normal     Neuro/Psych CVA    GI/Hepatic Neg liver ROS, GERD  Controlled,  Endo/Other  negative endocrine ROS  Renal/GU negative Renal ROS  negative genitourinary   Musculoskeletal  (+) Arthritis , Osteoarthritis,  osteopenia   Abdominal   Peds negative pediatric ROS (+)  Hematology negative hematology ROS (+)   Anesthesia Other Findings Patient denies any issue with reflux  Reproductive/Obstetrics                            Anesthesia Physical  Anesthesia Plan  ASA: III and emergent  Anesthesia Plan: General   Post-op Pain Management:    Induction: Intravenous, Rapid sequence and Cricoid pressure planned  PONV Risk Score and Plan: 2 and Ondansetron, Dexamethasone and Treatment may vary due to age or medical condition  Airway Management Planned: Oral ETT  Additional Equipment: Arterial line  Intra-op Plan:   Post-operative Plan: Possible Post-op intubation/ventilation  Informed Consent: I have reviewed the patients History and Physical, chart, labs and discussed the procedure including the risks, benefits and alternatives for the proposed anesthesia with the patient or authorized representative who has indicated his/her understanding and acceptance.     Dental advisory given  Plan Discussed with: CRNA  Anesthesia Plan Comments:        Anesthesia  Quick Evaluation

## 2020-09-01 NOTE — Progress Notes (Signed)
Initial Nutrition Assessment  DOCUMENTATION CODES:   Not applicable  INTERVENTION:   Initiate tube feeding via Cortrak 4/20: Vital AF 1.2 at 50 ml/h (1200 ml per day)  Provides 1440 kcal, 90 gm protein, 973 ml free water daily  Adjust as needed if pt remains on propofol/cleviprex  NUTRITION DIAGNOSIS:   Inadequate oral intake related to inability to eat as evidenced by NPO status.  GOAL:   Patient will meet greater than or equal to 90% of their needs  MONITOR:   I & O's  REASON FOR ASSESSMENT:   Consult Assessment of nutrition requirement/status  ASSESSMENT:   Pt with PMH of HTN, GERD, and HLD admitted with R-sided weakness per CT pt with L PCA territory stroke s/p thrombectomy with stent.   Pt out of room at time of visit. Per MD notes possible extubation later today.  Weight appears stable per chart review.   Patient is currently intubated on ventilator support MV: 4.8 L/min Temp (24hrs), Avg:98.1 F (36.7 C), Min:97.7 F (36.5 C), Max:98.3 F (36.8 C)  Propofol: 20 ml/hr provides: 528 kcal  Medications reviewed and include:  Cleviprex @ 12 ml/hr provides: 576 kcal  D5 1/2NS @ 50 ml/hr Labs reviewed     Diet Order:   Diet Order            Diet NPO time specified  Diet effective now                 EDUCATION NEEDS:   No education needs have been identified at this time  Skin:  Skin Assessment: Reviewed RN Assessment  Last BM:  unknown  Height:   Ht Readings from Last 1 Encounters:  09/01/20 5\' 3"  (1.6 m)    Weight:   Wt Readings from Last 1 Encounters:  09/01/20 68 kg    Ideal Body Weight:  52.2 kg  BMI:  There is no height or weight on file to calculate BMI.  Estimated Nutritional Needs:   Kcal:  1500-1700  Protein:  80-100 grams  Fluid:  > 1.5 L/day  Lockie Pares., RD, LDN, CNSC See AMiON for contact information

## 2020-09-01 NOTE — Progress Notes (Addendum)
1500:  Patient arrived to 4NICU from PACU.  Patient sedated and following commands.  Groin site level 0.  CT scheduled for post 4 hour Agrastat.    1545:  CT completed.  Neurology notified.  1700:  Patient Extubated. Dr. Estanislado Pandy paged and verified that patient can sit up after 1600.

## 2020-09-01 NOTE — Progress Notes (Signed)
Called the floor and unable to reach RN for Ms. Deanna Williams. Trying to follow up with RN about MRI time for pt.

## 2020-09-01 NOTE — Anesthesia Procedure Notes (Signed)
Procedure Name: Intubation Date/Time: 09/01/2020 9:39 AM Performed by: Inda Coke, CRNA Pre-anesthesia Checklist: Patient identified, Emergency Drugs available, Suction available and Patient being monitored Patient Re-evaluated:Patient Re-evaluated prior to induction Oxygen Delivery Method: Circle System Utilized Preoxygenation: Pre-oxygenation with 100% oxygen Induction Type: IV induction Ventilation: Mask ventilation without difficulty Laryngoscope Size: Mac and 3 Grade View: Grade I Tube type: Oral Tube size: 7.5 mm Number of attempts: 1 Airway Equipment and Method: Stylet and Oral airway Placement Confirmation: ETT inserted through vocal cords under direct vision,  positive ETCO2 and breath sounds checked- equal and bilateral Secured at: 20 cm Tube secured with: Tape Dental Injury: Teeth and Oropharynx as per pre-operative assessment

## 2020-09-01 NOTE — Consult Note (Addendum)
[facility]rology Consult Services   Date of Service:   09/01/2020 06:54:55  Diagnosis:     .  I63.9 - Cerebrovascular accident (CVA), unspecified mechanism ([place])  Impression:     . 87 year old woman with probable small vessel stroke affecting the left hemisphere. I recommend aspirin 81 mg daily. She is outside of the window for any thrombolytic therapy. I recommend MRI of the brain. She will need MRA head and neck. She will need echocardiogram as well, fasting lipid panel and hemoglobin A1c.  Metrics: Last Known Well: 08/31/2020 22:00:00 [facility] Notification Time: 09/01/2020 06:54:55 Arrival Time: 09/01/2020 06:34:00 Stamp Time: 09/01/2020 06:54:55 Initial Response Time: 09/01/2020 07:01:47 Symptoms: Right-sided weakness. NIHSS Start Assessment Time: 09/01/2020 07:03:47 Patient is not a candidate for Thrombolytic. Thrombolytic Medical Decision: 09/01/2020 07:05:57 Patient was not deemed candidate for Thrombolytic because of following reasons: Last Well Known Above 4.5 Hours.  CT head showed no acute hemorrhage or acute core infarct.  ED Physician notified of diagnostic impression and management plan on 09/01/2020 07:11:40  Advanced Imaging: Advanced Imaging Not Recommended because:  Clinical presentation is not suggestion of LVO or Low clinical suspicion of LVO based on presentation   Our recommendations are outlined below.  Recommendations:      .  Stroke/Telemetry Floor     .  Neuro Checks     .  Bedside Swallow Eval     .  DVT Prophylaxis     .  IV Fluids, Normal Saline     .  Head of Bed 30 Degrees     .  Euglycemia and Avoid Hyperthermia (PRN Acetaminophen)     .  Initiate Aspirin 81 MG Daily  Routine Consultation with [facility] for Follow up Care  Sign Out:     .  Discussed with Emergency Department Provider    ------------------------------------------------------------------------------  History of Present  Illness: Patient is a 87 year old Female.  Patient was brought by private transportation with symptoms of Right-sided weakness.  87 year old woman who presents with right-sided weakness. The patient was last known well at approximately 2200 last evening. She went to bed. When she awoke she was found to have weakness on the right side. This affected her arm and leg. She presented to the hospital.  Last seen normal was beyond 4.5 hours of presentation. There is no history of hemorrhagic complications or intracranial hemorrhage. There is no history of Recent Anticoagulants. There is no history of recent major surgery. There is no history of recent stroke.  Anticoagulant use:  No  Antiplatelet use: No  Allergies:  Reviewed     Examination: BP(177/98), Pulse(80), Blood Glucose(154) 1A: Level of Consciousness - Alert; keenly responsive + 0 1B: Ask Month and Age - Both Questions Right + 0 1C: Blink Eyes & Squeeze Hands - Performs Both Tasks + 0 2: Test Horizontal Extraocular Movements - Normal + 0 3: Test Visual Fields - No Visual Loss + 0 4: Test Facial Palsy (Use Grimace if Obtunded) - Minor paralysis (flat nasolabial fold, smile asymmetry) + 1 5A: Test Left Arm Motor Drift - No Drift for 10 Seconds + 0 5B: Test Right Arm Motor Drift - Drift, but doesn't hit bed + 1 6A: Test Left Leg Motor Drift - No Drift for 5 Seconds + 0 6B: Test Right Leg Motor Drift - Drift, but doesn't hit bed + 1 7: Test Limb Ataxia (FNF/Heel-Shin) - No Ataxia + 0 8: Test Sensation - Normal; No sensory loss + 0 9: Test  Language/Aphasia - Normal; No aphasia + 0 10: Test Dysarthria - Normal + 0 11: Test Extinction/Inattention - No abnormality + 0  NIHSS Score: 3   Pre-Morbid Modified Rankin Scale: 0 Points = No symptoms at all   Patient/Family was informed the Neurology Consult would occur via TeleHealth consult by way of interactive audio and video telecommunications and consented to receiving care in  this manner.   Patient is being evaluated for possible acute neurologic impairment and high probability of imminent or life-threatening deterioration. I spent total of 30 minutes providing care to this patient, including time for face to face visit via telemedicine, review of medical records, imaging studies and discussion of findings with providers, the patient and/or family.   Dr Barnetta Chapel   [facility] [phone]  Case [id number]   ADDENDUM  There is evidence of a left PCA occlusion at P2.  This is unlikely to be amenable to any NIR intervention.  ED doc will call neurointerventionalist.  At his point I would treat with ASA 81mg  and Plavix 75mg

## 2020-09-01 NOTE — ED Triage Notes (Signed)
Emergency Medicine Provider Triage Evaluation Note  Deanna Williams , a 87 y.o. female  was evaluated in triage.  Pt complains of R facial and RU/RL extremity weakness and numbness. Last seen normal 10PM.    Review of Systems  Positive: + R sided weakness and numbness Negative: No HA, CP, speech abnormalities  Physical Exam  BP (!) 146/117 (BP Location: Right Arm)   Pulse 72   Temp 98.3 F (36.8 C) (Oral)   Resp 18   Ht 5\' 3"  (1.6 m)   Wt 68 kg   SpO2 94%   BMI 26.57 kg/m  Gen:   Awake, no distress   HEENT:  Atraumatic  Resp:  Normal effort  Cardiac:  Normal rate  Abd:   Nondistended, nontender  MSK:   Moves extremities without difficulty  Neuro:  Speech clear, no facial droop, right upper and lower extremity ataxia and drift with 4/5 strength  Medical Decision Making  Medically screening exam initiated at 6:57 AM.  Appropriate orders placed.  Deanna Williams was informed that the remainder of the evaluation will be completed by another provider, this initial triage assessment does not replace that evaluation, and the importance of remaining in the ED until their evaluation is complete.  Clinical Impression  87 year old female with no significant past medical history who presents for evaluation of right-sided facial, right upper, and right lower extremity weakness and numbness.  Last seen normal at 10 PM.  Presentation concerning for acute stroke.  Code stroke initiated.  Care transferred to Dr. Clemencia Course, [place], MD 09/01/20 [phone]

## 2020-09-01 NOTE — Code Documentation (Signed)
Stroke Response Nurse Documentation Code Documentation  Deanna Williams is a 87 y.o. female arriving to [place] ED via [facility] on 09/01/2020 with past medical hx of hypertension, hyperlipidemia. Code stroke was activated by [facility] after patient was noted to have some right sided sensory decreased and weakness that started when she woke up this morning. CTA showed pCA P2 occlusion and patient transferred to [facility] for IR.   Stroke team at the bedside on patient arrival. NIHSS 4, see documentation for details and code stroke times. Patient with right leg weakness, right decreased sensation and Sensory  neglect and visual neglect on exam. Care/Plan: Patient to be taken directly to IR and admitted to ICU. Bedside handoff with Elta Guadeloupe, RN IR.    Kathrin Greathouse  Stroke Response RN

## 2020-09-01 NOTE — Progress Notes (Signed)
Pt transported from PACU to 4N20 without any complications.

## 2020-09-01 NOTE — ED Triage Notes (Signed)
EMS brought in from home for right sided weakness/numbness that started at 10pm last night. No facial droop. Numbness to right sided of face only.

## 2020-09-01 NOTE — Transfer of Care (Signed)
Immediate Anesthesia Transfer of Care Note  Patient: QUYNH BASSO  Procedure(s) Performed: IR WITH ANESTHESIA (N/A )  Patient Location: PACU  Anesthesia Type:General  Level of Consciousness: Patient remains intubated per anesthesia plan  Airway & Oxygen Therapy: Patient remains intubated per anesthesia plan and Patient placed on Ventilator (see vital sign flow sheet for setting)  Post-op Assessment: Report given to RN and Post -op Vital signs reviewed and stable  Post vital signs: Reviewed and stable  Last Vitals:  Vitals Value Taken Time  BP 127/68 09/01/20 1222  Temp    Pulse 76 09/01/20 1222  Resp 12 09/01/20 1222  SpO2 100 % 09/01/20 1222    Last Pain: There were no vitals filed for this visit.       Complications: No complications documented.

## 2020-09-01 NOTE — ED Notes (Signed)
Report to EMS.

## 2020-09-01 NOTE — H&P (Addendum)
Stroke Neurology     Consult Date:  09/01/20  The history was obtained from the patient, EMS, EMR.  During history and examination, all items were able to obtain unless otherwise noted.  History of Present Illness:  Deanna Williams is an 87 y.o. Caucasian female with PMH of hypertension, hyperlipidemia, and GERD who presented to [facility] ED with weakness.   For full H&P, Please also refer to Dr. Johny Chess consult note.  Patient was transferred to [facility] for IR thrombectomy under a Code Stroke. Patient was evaluated on the bridge and NIHSS appeared to be 4. Patient was alert and oriented. Patient was taken to IR and intubated for thrombectomy. Patient continued to note feeling weaker than normal. CT confirmed stroke of the L occipital-parietal cortex and concerning for thrombus in the L PCA. CT Perfusion noted L PCA penumbra smaller than reported mismatch volume.   Date last known well: Date: 08/31/2020  Time last known well: Time: 10:00 PM tPA Given: No: outside of window MRS:  0 NIHSS:  4  Past Medical History:  Diagnosis Date  . Gastroesophageal reflux disease   . Hyperlipidemia   . Hypertension   . Lichen sclerosus   . Lichen sclerosus et atrophicus   . Osteopenia   . Palpitations      Past Surgical History:  Procedure Laterality Date  . CARPAL TUNNEL RELEASE    . FOOT SURGERY    . HYSTEROSCOPY WITH D & C N/A 11/02/2015   Procedure: DILATATION AND CURETTAGE /HYSTEROSCOPY;  Surgeon: Brayton Mars, MD;  Location: [facility] ORS;  Service: Gynecology;  Laterality: N/A;    Family History  Problem Relation Age of Onset  . Cancer Mother   . Leukemia Mother   . Cancer Father        colon  . Hypertension Father   . Arthritis Sister   . Breast cancer Neg Hx      Social History:  reports that she has never smoked. She has never used smokeless tobacco. She reports current alcohol use. She reports that she does not use drugs.  Review of Systems: negative unless stated above.  Urgency of procedure precluded through review os systems by questioning patient.   Allergies: No Known Allergies   Medications:  Anti-infectives (From admission, onward)   Start     Dose/Rate Route Frequency Ordered Stop   09/01/20 0935  ceFAZolin (ANCEF) 2-4 GM/100ML-% IVPB       Note to Pharmacy: Margaretmary Dys   : cabinet override      09/01/20 0935 09/01/20 2144     Current Outpatient Medications  Medication Instructions  . clobetasol ointment (TEMOVATE) 9.41 % 1 application, 2 times daily  . conjugated estrogens (PREMARIN) vaginal cream 1 Applicatorful, Vaginal, Daily  . Omega-3 Fatty Acids (FISH OIL) 1000 MG CPDR Oral  . omeprazole (PRILOSEC) 20 mg, Oral, Daily  . terconazole (TERAZOL 3) 0.8 % vaginal cream 1 applicator, Vaginal, Daily at bedtime  . vitamin B-12 (CYANOCOBALAMIN) 1,000 mcg, Oral, Daily  . Vitamin D, Cholecalciferol, 50 MCG (2000 UT) CAPS Oral   Test Results: CBC:  Recent Labs  Lab 09/01/20 0654  WBC 5.7  NEUTROABS 3.9  HGB 14.1  HCT 42.2  MCV 93.8  PLT 740   Basic Metabolic Panel:  Recent Labs  Lab 09/01/20 0654  NA 137  K 4.7  CL 107  CO2 23  GLUCOSE 122*  BUN 12  CREATININE 0.72  CALCIUM 9.0   Liver Function Tests: Recent Labs  Lab  09/01/20 0654  AST 24  ALT 12  ALKPHOS 75  BILITOT 1.2  PROT 6.4*  ALBUMIN 3.7   No results for input(s): LIPASE, AMYLASE in the last 168 hours. No results for input(s): AMMONIA in the last 168 hours. Coagulation Studies:  Recent Labs    09/01/20 0654  LABPROT 12.9  INR 1.0   Cardiac Enzymes: No results for input(s): CKTOTAL, CKMB, CKMBINDEX, TROPONINI in the last 168 hours. BNP: Invalid input(s): POCBNP CBG: No results for input(s): GLUCAP in the last 168 hours. Urinalysis:  Recent Labs  Lab 09/01/20 0654  COLORURINE STRAW*  LABSPEC 1.018  PHURINE 9.0*  GLUCOSEU NEGATIVE  HGBUR MODERATE*  BILIRUBINUR NEGATIVE  KETONESUR NEGATIVE  PROTEINUR NEGATIVE  NITRITE NEGATIVE  LEUKOCYTESUR  MODERATE*   Microbiology:  Results for orders placed or performed during the hospital encounter of 09/01/20  Resp Panel by RT-PCR (Flu A&B, Covid) Nasopharyngeal Swab     Status: None   Collection Time: 09/01/20  6:54 AM   Specimen: Nasopharyngeal Swab; Nasopharyngeal(NP) swabs in vial transport medium  Result Value Ref Range Status   SARS Coronavirus 2 by RT PCR NEGATIVE NEGATIVE Final    Comment: (NOTE) SARS-CoV-2 target nucleic acids are NOT DETECTED.  The SARS-CoV-2 RNA is generally detectable in upper respiratory specimens during the acute phase of infection. The lowest concentration of SARS-CoV-2 viral copies this assay can detect is 138 copies/mL. A negative result does not preclude SARS-Cov-2 infection and should not be used as the sole basis for treatment or other patient management decisions. A negative result may occur with  improper specimen collection/handling, submission of specimen other than nasopharyngeal swab, presence of viral mutation(s) within the areas targeted by this assay, and inadequate number of viral copies(<138 copies/mL). A negative result must be combined with clinical observations, patient history, and epidemiological information. The expected result is Negative.  Fact Sheet for Patients:  EntrepreneurPulse.com.au  Fact Sheet for Healthcare Providers:  IncredibleEmployment.be  This test is no t yet approved or cleared by the [place] FDA and  has been authorized for detection and/or diagnosis of SARS-CoV-2 by FDA under an Emergency Use Authorization (EUA). This EUA will remain  in effect (meaning this test can be used) for the duration of the COVID-19 declaration under Section 564(b)(1) of the Act, 21 U.S.C.section 360bbb-3(b)(1), unless the authorization is terminated  or revoked sooner.       Influenza A by PCR NEGATIVE NEGATIVE Final   Influenza B by PCR NEGATIVE NEGATIVE Final    Comment:  (NOTE) The Xpert Xpress SARS-CoV-2/FLU/RSV plus assay is intended as an aid in the diagnosis of influenza from Nasopharyngeal swab specimens and should not be used as a sole basis for treatment. Nasal washings and aspirates are unacceptable for Xpert Xpress SARS-CoV-2/FLU/RSV testing.  Fact Sheet for Patients: EntrepreneurPulse.com.au  Fact Sheet for Healthcare Providers: IncredibleEmployment.be  This test is not yet approved or cleared by the [place] FDA and has been authorized for detection and/or diagnosis of SARS-CoV-2 by FDA under an Emergency Use Authorization (EUA). This EUA will remain in effect (meaning this test can be used) for the duration of the COVID-19 declaration under Section 564(b)(1) of the Act, 21 U.S.C. section 360bbb-3(b)(1), unless the authorization is terminated or revoked.  Performed at [facility], [address] [place],  [address]    Lipid Panel:     Component Value Date/Time   CHOL 205 (H) 01/21/2020 1041   TRIG 81 01/21/2020 1041   HDL 81 01/21/2020  1041   LDLCALC 110 (H) 01/21/2020 1041   HgbA1c:  Lab Results  Component Value Date   HGBA1C 5.9 (A) 07/20/2020   Urine Drug Screen:     Component Value Date/Time   LABOPIA NONE DETECTED 09/01/2020 0654   COCAINSCRNUR NONE DETECTED 09/01/2020 0654   LABBENZ NONE DETECTED 09/01/2020 0654   AMPHETMU NONE DETECTED 09/01/2020 0654   THCU NONE DETECTED 09/01/2020 0654   LABBARB NONE DETECTED 09/01/2020 0654    Alcohol Level: No results for input(s): ETH in the last 168 hours.  CT Angio Head W or Wo Contrast  Result Date: 09/01/2020 CLINICAL DATA:  Right-sided facial droop and weakness since 10 p.m. EXAM: CT ANGIOGRAPHY HEAD AND NECK TECHNIQUE: Multidetector CT imaging of the head and neck was performed using the standard protocol during bolus administration of intravenous contrast. Multiplanar CT image reconstructions and MIPs were  obtained to evaluate the vascular anatomy. Carotid stenosis measurements (when applicable) are obtained utilizing NASCET criteria, using the distal internal carotid diameter as the denominator. CONTRAST:  82m OMNIPAQUE IOHEXOL 350 MG/ML SOLN COMPARISON:  Noncontrast head CT from earlier today FINDINGS: CTA NECK FINDINGS Aortic arch: Atheromatous plaque.  Three vessel branching. Right carotid system: Atheromatous wall thickening diffusely. Calcified plaque at the bifurcation without flow limiting stenosis or ulceration. Left carotid system: Circumferential atheromatous wall thickening. Calcified plaque at the bifurcation without ulceration or flow limiting stenosis. Vertebral arteries: No proximal subclavian flow limiting stenosis. Right dominant vertebral artery. Both vertebral arteries are patent to the dura. There is limitation to visualization of the proximal vertebral lumen on both sides due to artifact from intravenous contrast. Skeleton: Cervical spine degeneration with C3-4 and C4-5 anterolisthesis. Other neck: Extensive intravenous reflux of contrast. There is a stenotic appearance of the left brachiocephalic vein. The bolus dispersion cause a sub optimal opacification of arterial structures. Upper chest: Negative Review of the MIP images confirms the above findings CTA HEAD FINDINGS Anterior circulation: Atheromatous calcification at the carotid siphons. Hypoplastic left A1 segment with mildly larger right ICA. Atheromatous changes to medium vessels. There is a notable high-grade narrowing at the right A2 level. Severe narrowing of the left lower M2 branch seen on reformats. Posterior circulation: Right dominant vertebral artery. Atheromatous plaque at both V4 segments with 50-60% narrowing of the left V4 segment. The basilar artery is smooth and diffusely patent. Left P2 branch occlusion with subsequent reconstitution and then subsequent severe stenosis at the PCA bifurcation. Negative for aneurysm.  Venous sinuses: Diffusely patent Anatomic variants: None significant Review of the MIP images confirms the above findings These results were called by telephone at the time of interpretation on 09/01/2020 at 7:48 am [facility], who verbally acknowledged these results. IMPRESSION: 1. Confirmed left PCA occlusion at the left P2 segment with downstream reconstitution followed by a severe stenosis at the left PCA bifurcation. 2. Intracranial atherosclerosis with notable right A2, left M2, and left V4 segment stenoses. 3. Atherosclerosis in the neck without flow limiting stenosis. 4. Left brachiocephalic vein stenosis. Electronically Signed   By: JMonte FantasiaM.D.   On: 09/01/2020 07:49   CT Angio Neck W and/or Wo Contrast  Result Date: 09/01/2020 CLINICAL DATA:  Right-sided facial droop and weakness since 10 p.m. EXAM: CT ANGIOGRAPHY HEAD AND NECK TECHNIQUE: Multidetector CT imaging of the head and neck was performed using the standard protocol during bolus administration of intravenous contrast. Multiplanar CT image reconstructions and MIPs were obtained to evaluate the vascular anatomy. Carotid stenosis measurements (when  applicable) are obtained utilizing NASCET criteria, using the distal internal carotid diameter as the denominator. CONTRAST:  9m OMNIPAQUE IOHEXOL 350 MG/ML SOLN COMPARISON:  Noncontrast head CT from earlier today FINDINGS: CTA NECK FINDINGS Aortic arch: Atheromatous plaque.  Three vessel branching. Right carotid system: Atheromatous wall thickening diffusely. Calcified plaque at the bifurcation without flow limiting stenosis or ulceration. Left carotid system: Circumferential atheromatous wall thickening. Calcified plaque at the bifurcation without ulceration or flow limiting stenosis. Vertebral arteries: No proximal subclavian flow limiting stenosis. Right dominant vertebral artery. Both vertebral arteries are patent to the dura. There is limitation to visualization of the  proximal vertebral lumen on both sides due to artifact from intravenous contrast. Skeleton: Cervical spine degeneration with C3-4 and C4-5 anterolisthesis. Other neck: Extensive intravenous reflux of contrast. There is a stenotic appearance of the left brachiocephalic vein. The bolus dispersion cause a sub optimal opacification of arterial structures. Upper chest: Negative Review of the MIP images confirms the above findings CTA HEAD FINDINGS Anterior circulation: Atheromatous calcification at the carotid siphons. Hypoplastic left A1 segment with mildly larger right ICA. Atheromatous changes to medium vessels. There is a notable high-grade narrowing at the right A2 level. Severe narrowing of the left lower M2 branch seen on reformats. Posterior circulation: Right dominant vertebral artery. Atheromatous plaque at both V4 segments with 50-60% narrowing of the left V4 segment. The basilar artery is smooth and diffusely patent. Left P2 branch occlusion with subsequent reconstitution and then subsequent severe stenosis at the PCA bifurcation. Negative for aneurysm. Venous sinuses: Diffusely patent Anatomic variants: None significant Review of the MIP images confirms the above findings These results were called by telephone at the time of interpretation on 09/01/2020 at 7:48 am [facility], who verbally acknowledged these results. IMPRESSION: 1. Confirmed left PCA occlusion at the left P2 segment with downstream reconstitution followed by a severe stenosis at the left PCA bifurcation. 2. Intracranial atherosclerosis with notable right A2, left M2, and left V4 segment stenoses. 3. Atherosclerosis in the neck without flow limiting stenosis. 4. Left brachiocephalic vein stenosis. Electronically Signed   By: JMonte FantasiaM.D.   On: 09/01/2020 07:49   CT CEREBRAL PERFUSION W CONTRAST  Result Date: 09/01/2020 CLINICAL DATA:  Right-sided facial droop and weakness. Left P2 occlusion on CTA. EXAM: CT  PERFUSION BRAIN TECHNIQUE: Multiphase CT imaging of the brain was performed following IV bolus contrast injection. Subsequent parametric perfusion maps were calculated using RAPID software. CONTRAST:  436mOMNIPAQUE IOHEXOL 350 MG/ML SOLN COMPARISON:  Head and neck CTA earlier today FINDINGS: CT Brain Perfusion Findings: CBF (<30%) Volume: 0 mL Perfusion (Tmax>6.0s) volume: 42 mL Mismatch Volume: 42 mL Infarction Location: The small acute left temporo-occipital infarct on today's noncontrast CT was not detected by automated CTP processing. There is penumbra in the left PCA territory although the reported mismatch volume also includes tissue in both cerebellar hemispheres. IMPRESSION: Left PCA penumbra which is smaller than the reported 42 mL mismatch volume as that also includes bilateral cerebellar tissue. These results were communicated to Dr. ArRory Percyt 8:43 am on 09/01/2020 by text page via the [facility]St[facility] Florence[facility]aging system. Electronically Signed   By: AlLogan Bores.D.   On: 09/01/2020 08:47   CT HEAD CODE STROKE WO CONTRAST  Result Date: 09/01/2020 CLINICAL DATA:  Code stroke. Right-sided facial droop and weakness since 10 p.m. EXAM: CT HEAD WITHOUT CONTRAST TECHNIQUE: Contiguous axial images were obtained from the base of the skull through the vertex without intravenous contrast.  COMPARISON:  Brain MRI 08/23/2013 FINDINGS: Brain: Subtle low-density in the left temporal occipital cortex. No other visible infarct. No hemorrhage, hydrocephalus, or collection. Vascular: Atheromatous calcification. Especially on coronal imaging the left P 2 or P3 segment appears dense Skull: Normal. Negative for fracture or focal lesion. Sinuses/Orbits: Negative Other: These results were called by telephone at the time of interpretation on 09/01/2020 at 7:04 am to provider Dr Charna Archer, who verbally acknowledged these results. IMPRESSION: 1. Small acute infarct in the left occipital parietal cortex. The adjacent PCA is hyperdense on  reformats, suggesting acute thrombosis. The visible infarct is smaller than would be expected for the level of vessel hyperdensity, consider CTA/perfusion if a treatment candidate. 2. No intracranial hemorrhage. Electronically Signed   By: Monte Fantasia M.D.   On: 09/01/2020 07:08     EKG: normal EKG, normal sinus rhythm, unchanged from previous tracings, normal sinus rhythm.   Physical Examination: Temp:  [98.3 F (36.8 C)] 98.3 F (36.8 C) (04/19 0852) Pulse Rate:  [72-83] 83 (04/19 0852) Resp:  [13-18] 18 (04/19 0852) BP: (146-206)/(81-117) 174/85 (04/19 0852) SpO2:  [94 %-97 %] 96 % (04/19 0852) Weight:  [68 kg] 68 kg (04/19 0643)  General - Well nourished, well developed, in no apparent distress.  Cardiovascular - Regular rate and rhythm.  Mental Status -  Level of arousal and orientation to time, place, and person were intact. Language including expression, naming, repetition, comprehension was assessed and found intact. Attention span and concentration were normal. Recent and remote memory were intact. Fund of Knowledge was assessed and was intact.  Cranial Nerves II - XII - II - Visual field intact, but with simultaneous simulation patient had neglect of the LLQ of the Left eye. III, IV, VI - Extraocular movements intact. V - Facial sensation intact bilaterally. VII - Facial movement intact bilaterally. VIII - Hearing & vestibular intact bilaterally. X - Palate elevates symmetrically. XI -  shoulder shrug intact bilaterally. XII - Tongue protrusion intact.  Motor Strength - The patient's strength was 5/5 in BUE no drift noted. RLE vertical drift noted, 3-/5. LLE at least 3/5 no drift noted.  Motor Tone - Muscle tone was assessed at the neck and appendages and was normal.  Sensory - Light touch was assessed and patient failed test for extinction. Although patient had sensation to light touch in all extremities L> R.  Coordination - The patient had normal movements  in the hands and feet with no ataxia or dysmetria.  Tremor was absent.  Gait and Station - deferred.    NIHSS 1a Level of Conscious.: 0 1b LOC Questions: 0 1c LOC Commands: 0 2 Best Gaze: 0 3 Visual: 0 4 Facial Palsy: 0 5a Motor Arm - left: 0 5b Motor Arm - Right: 0 6a Motor Leg - Left: 0 6b Motor Leg - Right: 1 7 Limb Ataxia: 0 8 Sensory: 1 9 Best Language: 0 10 Dysarthria: 0 11 Extinct. and Inatten.: 2 TOTAL: 4   Assessment:  Ms. CASEE KNEPP is a 87 y.o. female with history of hypertension, hyperlipidemia, and GERD presenting with weakness. CT confirmed a small acute infarct in the L occipital parietal cortex. The adjacent PCA was noted to be hyperdense and concerning for thrombosis.CT perfusion done at the discussion with the interventionalist that shows no core with a moderately large penumbra to say.  She did not receive IV t-PA due to being outside of the window. Patient was transferred to [facility] for thrombectomy of occluded P1 of  L PCA. Revascularization was achieved and a stent was placed. Patient required a bolus of aggrastat and will need IV infusion for  4 h. Patient remains intubated.  Stroke: Small acute infarct inf L occipital parietal coretex, occlusion of L PCA etiology etiology atheroembolic vs cardioembolic   CT head  IMPRESSION: 1. Small acute infarct in the left occipital parietal cortex. The adjacent PCA is hyperdense on reformats, suggesting acute thrombosis. The visible infarct is smaller than would be expected for the level of vessel hyperdensity, consider CTA/perfusion if a treatment candidate. 2. No intracranial hemorrhage.   CTA head & neck  IMPRESSION: 1. Confirmed left PCA occlusion at the left P2 segment with downstream reconstitution followed by a severe stenosis at the left PCA bifurcation. 2. Intracranial atherosclerosis with notable right A2, left M2, and left V4 segment stenoses. 3. Atherosclerosis in the neck without flow limiting  stenosis. 4. Left brachiocephalic vein stenosis.  CT perfusion: IMPRESSION: Left PCA penumbra which is smaller than the reported 42 mL mismatch volume as that also includes bilateral cerebellar tissue.  MRI  pending  MRA  pending  2D Echo pending  LDL 110  HgbA1c 5.9   SCDs for VTE prophylaxis Diet Order            Diet NPO time specified  Diet effective now                   No antithrombotic prior to admission, now on aspirin 81 mg daily and Brilinta (ticagrelor) 90 mg bid. Per IR.  Aggrastat bolus 1762mg given   Aggrastat infusion 17073m  over 4h  Therapy recommendations:  SLP/PT/OT  Disposition:  Pending  Hypertension  Not on Cleviprex . Permissive hypertension (OK if < 180/105) but gradually normalize in 5-7 days . Long-term BP goal normotensive  Hyperlipidemia   Home meds:  Fish oil  LDL 110, goal < 70  Consider statin after MRI if confirms no hemorrhage  Other Stroke Risk Factors  Advanced age   Hospital day # 0   Thank you for this consultation and allowing usKoreao participate in the care of this patient.  JaDamita DunningsMD PGY-1  ATTENDING NOTE: I reviewed above note and agree with the assessment and plan. Pt was seen and examined.   [age]ear old female with history of hypertension, hyperlipidemia presented to ED for right-sided numbness and weakness since woke up this morning.  CT head showed acute infarct small left PCA territory.  Left PCA hyperdense suggesting LVO.  Patient not a tPA candidate given outside window.  CTA head and neck showed left P1/P2 occlusion.  [facility]  I met patient at the bridge, patient awake alert, orientated, no visual field deficit but concerning for right lower quadrant simultanagnosia, right lower extremity mild drift and decreased sensation and sensory neglect.  NIH score 4.  Patient has consented for thrombectomy, she was directly sent to angio suite for thrombectomy.   Discussed with Dr. DeEstanislado Pandywill go ahead with angiogram and intention to treat.   For detailed assessment and plan, please refer to above as I have made changes wherever appropriate.   JiRosalin HawkingMD PhD Stroke Neurology 09/01/2020 5:21 PM  This patient is critically ill due to left PCA stroke with PCA occlusion, intention to treat with thrombectomy and at significant risk of neurological worsening, death form stroke extension, hemorrhagic conversion, side effect from thrombectomy. This patient's care requires constant monitoring of vital signs, hemodynamics, respiratory and cardiac monitoring, review  of multiple databases, neurological assessment, discussion with family, other specialists and medical decision making of high complexity. I spent 30 minutes of neurocritical care time in the care of this patient.  I had discussion with Dr. Estanislado Pandy. I had long discussion with daughter Jeannene Patella over the phone, updated pt current condition, treatment plan and potential prognosis, and answered all the questions.  She expressed understanding and appreciation.        To contact Stroke Continuity provider, please refer to [email or web address] After hours, contact General Neurology

## 2020-09-01 NOTE — ED Notes (Signed)
Teleneurologist assessing patient at this time

## 2020-09-01 NOTE — ED Notes (Signed)
Called [facility] (Tammy) for code stroke

## 2020-09-01 NOTE — Progress Notes (Signed)
  Echocardiogram 2D Echocardiogram has been performed.  Deanna Williams 09/01/2020, 1:45 PM

## 2020-09-01 NOTE — Procedures (Signed)
Extubation Procedure Note  Patient Details:   Name: Deanna Williams DOB: 01-17-1929 MRN: [id number]   Airway Documentation:    Vent end date: 09/01/20 Vent end time: 1708   Evaluation  O2 sats: stable throughout Complications: No apparent complications Patient did tolerate procedure well. Bilateral Breath Sounds: Clear   Patient extubated per MD order & placed on 2L [place]. Patient able to speak & cough post extubation.  Kathie Dike 09/01/2020, 5:12 PM

## 2020-09-01 NOTE — Procedures (Addendum)
S/P RT VA angiogram followed bt revascularization of occluded P1 Lt PCA with x 1 pass with 38mm x 40 mm solitaire X retriever and aspiration achieving a TICI 3 revascularization followed by placement of a rescue stent for re occlusion due to underlying ICAD achieving a TICI 5 recvascularization .  Slow flow in the stent requiring bolus of aggrastat and IV infusion for 4 hrs . Stop after 4 hrs. ASlso given aspirin 81 mg and brilinta 180 mg via OG tube. Post CT  22F angioseal for hemostasis in the RT groin.. Distal pulses all present in both feet . Patient left intubated.Marland Kitchen S.Briseidy Spark MD

## 2020-09-01 NOTE — Sedation Documentation (Signed)
SBAR called to TK all questions answered to satisfaction.

## 2020-09-02 ENCOUNTER — Inpatient Hospital Stay (HOSPITAL_COMMUNITY): Payer: PPO

## 2020-09-02 ENCOUNTER — Other Ambulatory Visit (HOSPITAL_COMMUNITY): Payer: Self-pay | Admitting: Physician Assistant

## 2020-09-02 ENCOUNTER — Encounter (HOSPITAL_COMMUNITY): Payer: Self-pay | Admitting: Radiology

## 2020-09-02 DIAGNOSIS — I63531 Cerebral infarction due to unspecified occlusion or stenosis of right posterior cerebral artery: Secondary | ICD-10-CM | POA: Diagnosis not present

## 2020-09-02 DIAGNOSIS — I63532 Cerebral infarction due to unspecified occlusion or stenosis of left posterior cerebral artery: Secondary | ICD-10-CM

## 2020-09-02 LAB — POCT I-STAT 7, (LYTES, BLD GAS, ICA,H+H)
Acid-base deficit: 2 mmol/L (ref 0.0–2.0)
Bicarbonate: 22.1 mmol/L (ref 20.0–28.0)
Calcium, Ion: 1.24 mmol/L (ref 1.15–1.40)
HCT: 35 % — ABNORMAL LOW (ref 36.0–46.0)
Hemoglobin: 11.9 g/dL — ABNORMAL LOW (ref 12.0–15.0)
O2 Saturation: 95 %
Patient temperature: 98.8
Potassium: 3.5 mmol/L (ref 3.5–5.1)
Sodium: 139 mmol/L (ref 135–145)
TCO2: 23 mmol/L (ref 22–32)
pCO2 arterial: 36.3 mmHg (ref 32.0–48.0)
pH, Arterial: 7.392 (ref 7.350–7.450)
pO2, Arterial: 77 mmHg — ABNORMAL LOW (ref 83.0–108.0)

## 2020-09-02 LAB — CBC
HCT: 38.4 % (ref 36.0–46.0)
Hemoglobin: 12.5 g/dL (ref 12.0–15.0)
MCH: 31.1 pg (ref 26.0–34.0)
MCHC: 32.6 g/dL (ref 30.0–36.0)
MCV: 95.5 fL (ref 80.0–100.0)
Platelets: 240 10*3/uL (ref 150–400)
RBC: 4.02 MIL/uL (ref 3.87–5.11)
RDW: 12.5 % (ref 11.5–15.5)
WBC: 10.3 10*3/uL (ref 4.0–10.5)
nRBC: 0 % (ref 0.0–0.2)

## 2020-09-02 LAB — BASIC METABOLIC PANEL
Anion gap: 8 (ref 5–15)
BUN: 9 mg/dL (ref 8–23)
CO2: 21 mmol/L — ABNORMAL LOW (ref 22–32)
Calcium: 8.5 mg/dL — ABNORMAL LOW (ref 8.9–10.3)
Chloride: 108 mmol/L (ref 98–111)
Creatinine, Ser: 0.65 mg/dL (ref 0.44–1.00)
GFR, Estimated: >60 mL/min (ref >60)
Glucose, Bld: 116 mg/dL — ABNORMAL HIGH (ref 70–99)
Potassium: 3.6 mmol/L (ref 3.5–5.1)
Sodium: 137 mmol/L (ref 135–145)

## 2020-09-02 LAB — GLUCOSE, CAPILLARY
Glucose-Capillary: 107 mg/dL — ABNORMAL HIGH (ref 70–99)
Glucose-Capillary: 128 mg/dL — ABNORMAL HIGH (ref 70–99)

## 2020-09-02 LAB — LIPID PANEL
Cholesterol: 188 mg/dL (ref 0–200)
HDL: 75 mg/dL (ref >40)
LDL Cholesterol: 99 mg/dL (ref 0–99)
Total CHOL/HDL Ratio: 2.5 RATIO
Triglycerides: 68 mg/dL (ref <150)
VLDL: 14 mg/dL (ref 0–40)

## 2020-09-02 LAB — MAGNESIUM: Magnesium: 1.9 mg/dL (ref 1.7–2.4)

## 2020-09-02 LAB — HEMOGLOBIN A1C
Hgb A1c MFr Bld: 5.9 % — ABNORMAL HIGH (ref 4.8–5.6)
Mean Plasma Glucose: 122.63 mg/dL

## 2020-09-02 LAB — PHOSPHORUS: Phosphorus: 2.6 mg/dL (ref 2.5–4.6)

## 2020-09-02 MED ORDER — TICAGRELOR 90 MG PO TABS: 90.0000 mg | ORAL_TABLET | Freq: Two times a day (BID) | ORAL | 0 refills | Status: DC

## 2020-09-02 MED ORDER — ENOXAPARIN SODIUM 40 MG/0.4ML ~~LOC~~ SOLN
40.0000 mg | SUBCUTANEOUS | Status: DC
  Administered 2020-09-02 – 2020-09-04 (×3): 40 mg via SUBCUTANEOUS
  Filled 2020-09-02 (×3): qty 0.4

## 2020-09-02 MED ORDER — LABETALOL HCL 5 MG/ML IV SOLN: 5.0000 mg | INTRAVENOUS | Status: DC | PRN

## 2020-09-02 MED ORDER — AMLODIPINE BESYLATE 10 MG PO TABS
10.0000 mg | ORAL_TABLET | Freq: Every day | ORAL | Status: DC
  Administered 2020-09-02 – 2020-09-05 (×4): 10 mg via ORAL
  Filled 2020-09-02 (×4): qty 1

## 2020-09-02 MED ORDER — MAGNESIUM SULFATE 2 GM/50ML IV SOLN
2.0000 g | Freq: Once | INTRAVENOUS | Status: AC
  Administered 2020-09-02: 2 g via INTRAVENOUS
  Filled 2020-09-02: qty 50

## 2020-09-02 MED ORDER — POTASSIUM CHLORIDE CRYS ER 20 MEQ PO TBCR
40.0000 meq | EXTENDED_RELEASE_TABLET | Freq: Once | ORAL | Status: AC
  Administered 2020-09-02: 40 meq via ORAL
  Filled 2020-09-02: qty 2

## 2020-09-02 NOTE — Progress Notes (Signed)
Referring Physician(s): Rosalin Hawking  Supervising Physician: Luanne Bras  Patient Status:  [facility] - In-pt  Chief Complaint:  Code stroke  Brief History:  Deanna Williams is a 87 y.o. female with medical issues including hypertension and hyperlipidemia.  She presented to the emergency room with right-sided numbness and weakness.    Her last known well was when she went to bed around 10 to 10:30 PM.    She woke up yesterday morning and noticed that her right arm and leg are heavy and feel numb.  She also noted some weakness on the right side and right facial numbness around her mouth and lips.  Code stroke was activated around 7 AM and she waas seen by telemedicine neurology.  She was found to have a small hypodensity in the left PCA territory. She was outside the window for IV tPA and further recommendations for vessel imaging were given.  CT angiogram of the head and neck showed a left P2 occlusion.  She underwent cerebral angiography by Dr. Estanislado Pandy with successful intervention/thrombectomy of the left PCA.   Subjective:  Doing well. Sitting up in bed. No complaints.  Allergies: Patient has no known allergies.  Medications: Prior to Admission medications   Medication Sig Start Date End Date Taking? Authorizing Provider  conjugated estrogens (PREMARIN) vaginal cream Place 1 Applicatorful vaginally daily. Patient not taking: Reported on 09/01/2020 06/29/20   Jerrol Banana., MD  omeprazole (PRILOSEC) 20 MG capsule Take 1 capsule (20 mg total) by mouth daily. Patient not taking: Reported on 09/01/2020 06/29/20   Jerrol Banana., MD  terconazole (TERAZOL 3) 0.8 % vaginal cream Place 1 applicator vaginally at bedtime. Patient not taking: No sig reported 01/21/20   Jerrol Banana., MD     Vital Signs: BP 133/69   Pulse 79   Temp 98.2 F (36.8 C) (Oral)   Resp 19   SpO2 100%   Physical Exam Alert, awake, and oriented x3. Speech and  comprehension intact. EOMs intact bilaterally without nystagmus or subjective diplopia. Visual fields intact left eye but Williams deficit in the right eye right lower quadrant No facial asymmetry. Tongue midline. Motor power symmetric proportional to effort. No pronator drift. Fine motor and coordination intact and symmetric. 5/5 Strength bilaterally Common femoral artery puncture site looks good, no bleeding, no hematoma, no pseudoaneurysm Gait not assessed. Romberg not assessed. Heel to toe not assessed.  Imaging: CT Angio Head W or Wo Contrast  Result Date: 09/01/2020 CLINICAL DATA:  Right-sided facial droop and weakness since 10 p.m. EXAM: CT ANGIOGRAPHY HEAD AND NECK TECHNIQUE: Multidetector CT imaging of the head and neck was performed using the standard protocol during bolus administration of intravenous contrast. Multiplanar CT image reconstructions and MIPs were obtained to evaluate the vascular anatomy. Carotid stenosis measurements (when applicable) are obtained utilizing NASCET criteria, using the distal internal carotid diameter as the denominator. CONTRAST:  75mL OMNIPAQUE IOHEXOL 350 MG/ML SOLN COMPARISON:  Noncontrast head CT from earlier today FINDINGS: CTA NECK FINDINGS Aortic arch: Atheromatous plaque.  Three vessel branching. Right carotid system: Atheromatous wall thickening diffusely. Calcified plaque at the bifurcation without flow limiting stenosis or ulceration. Left carotid system: Circumferential atheromatous wall thickening. Calcified plaque at the bifurcation without ulceration or flow limiting stenosis. Vertebral arteries: No proximal subclavian flow limiting stenosis. Right dominant vertebral artery. Both vertebral arteries are patent to the dura. There is limitation to visualization of the proximal vertebral lumen on both sides due to artifact  from intravenous contrast. Skeleton: Cervical spine degeneration with C3-4 and C4-5 anterolisthesis. Other neck: Extensive  intravenous reflux of contrast. There is a stenotic appearance of the left brachiocephalic vein. The bolus dispersion cause a sub optimal opacification of arterial structures. Upper chest: Negative Review of the MIP images confirms the above findings CTA HEAD FINDINGS Anterior circulation: Atheromatous calcification at the carotid siphons. Hypoplastic left A1 segment with mildly larger right ICA. Atheromatous changes to medium vessels. There is a notable high-grade narrowing at the right A2 level. Severe narrowing of the left lower M2 branch seen on reformats. Posterior circulation: Right dominant vertebral artery. Atheromatous plaque at both V4 segments with 50-60% narrowing of the left V4 segment. The basilar artery is smooth and diffusely patent. Left P2 branch occlusion with subsequent reconstitution and then subsequent severe stenosis at the PCA bifurcation. Negative for aneurysm. Venous sinuses: Diffusely patent Anatomic variants: None significant Review of the MIP images confirms the above findings These results were called by telephone at the time of interpretation on 09/01/2020 at 7:48 am to provider [facility] , who verbally acknowledged these results. IMPRESSION: 1. Confirmed left PCA occlusion at the left P2 segment with downstream reconstitution followed by a severe stenosis at the left PCA bifurcation. 2. Intracranial atherosclerosis with notable right A2, left M2, and left V4 segment stenoses. 3. Atherosclerosis in the neck without flow limiting stenosis. 4. Left brachiocephalic vein stenosis. Electronically Signed   By: Monte Fantasia M.D.   On: 09/01/2020 07:49   CT HEAD WO CONTRAST  Result Date: 09/01/2020 CLINICAL DATA:  Stroke follow-up. EXAM: CT HEAD WITHOUT CONTRAST TECHNIQUE: Contiguous axial images were obtained from the base of the skull through the vertex without intravenous contrast. COMPARISON:  Same day CT head. FINDINGS: Brain: Similar subtle hypodensity in the left temporal  occipital cortex. Areas of subtle cortical hyperdensity described on the recent prior have resolved and likely represented contrast staining recent catheter arteriogram. No evidence of new acute large vascular territory infarct. No acute hemorrhage. No progressive mass effect. No midline shift. Basal cisterns are patent. No hydrocephalus. No mass lesion. No visible extra-axial fluid collections. Vascular: Left PCA stent. Vasculature better characterized on same day catheter arteriogram. Skull: No acute fracture. Sinuses/Orbits: No acute abnormality. Other: Small left mastoid effusion. IMPRESSION: 1. Similar suspected small acute infarct in the left PCA territory. No progressive mass effect. MRI could further characterize if clinically indicated. 2. Areas of subtle cortical hyperdensity described on the recent prior have resolved and likely represented contrast staining from recent catheter arteriogram. No evidence of acute hemorrhage. Electronically Signed   By: Margaretha Sheffield MD   On: 09/01/2020 16:16   CT HEAD WO CONTRAST  Result Date: 09/01/2020 CLINICAL DATA:  Stroke follow-up post intervention. EXAM: CT HEAD WITHOUT CONTRAST TECHNIQUE: Contiguous axial images were obtained from the base of the skull through the vertex without intravenous contrast. COMPARISON:  Same day CT exams. FINDINGS: Brain: Similar subtle loaded in the left temporal occipital cortex. No evidence of mass occupying acute hemorrhage. Subtle/faint cortical versus extra-axial hyperdensity in the posteroinferior right temporal and occipital lobes (for example see series 6, image 29 and image 19). Evaluation for small volume subarachnoid hemorrhage is somewhat limited due to residual contrast within the vasculature. No hydrocephalus. No midline shift. Basal cisterns are patent. No mass lesion. Vascular: Residual contrast is noted within the vasculature from same day contrasted studies. Vasculature is better evaluated on same day catheter  arteriogram. Skull: No acute fracture. Sinuses/Orbits: No acute findings.  Other: Mastoid effusions. IMPRESSION: 1. No substantial change in small acute infarct in the left occipital parietal cortex. No progressive mass effect 2. Subtle/faint cortical versus extra-axial hyperdensity in the posteroinferior right temporal and occipital lobes, which could represent contrast staining given recent catheter arteriogram versus trace hemorrhage. No mass occupying acute hemorrhage. Recommend attention on close follow-up. Electronically Signed   By: Margaretha Sheffield MD   On: 09/01/2020 12:49   CT Angio Neck W and/or Wo Contrast  Result Date: 09/01/2020 CLINICAL DATA:  Right-sided facial droop and weakness since 10 p.m. EXAM: CT ANGIOGRAPHY HEAD AND NECK TECHNIQUE: Multidetector CT imaging of the head and neck was performed using the standard protocol during bolus administration of intravenous contrast. Multiplanar CT image reconstructions and MIPs were obtained to evaluate the vascular anatomy. Carotid stenosis measurements (when applicable) are obtained utilizing NASCET criteria, using the distal internal carotid diameter as the denominator. CONTRAST:  58mL OMNIPAQUE IOHEXOL 350 MG/ML SOLN COMPARISON:  Noncontrast head CT from earlier today FINDINGS: CTA NECK FINDINGS Aortic arch: Atheromatous plaque.  Three vessel branching. Right carotid system: Atheromatous wall thickening diffusely. Calcified plaque at the bifurcation without flow limiting stenosis or ulceration. Left carotid system: Circumferential atheromatous wall thickening. Calcified plaque at the bifurcation without ulceration or flow limiting stenosis. Vertebral arteries: No proximal subclavian flow limiting stenosis. Right dominant vertebral artery. Both vertebral arteries are patent to the dura. There is limitation to visualization of the proximal vertebral lumen on both sides due to artifact from intravenous contrast. Skeleton: Cervical spine degeneration  with C3-4 and C4-5 anterolisthesis. Other neck: Extensive intravenous reflux of contrast. There is a stenotic appearance of the left brachiocephalic vein. The bolus dispersion cause a sub optimal opacification of arterial structures. Upper chest: Negative Review of the MIP images confirms the above findings CTA HEAD FINDINGS Anterior circulation: Atheromatous calcification at the carotid siphons. Hypoplastic left A1 segment with mildly larger right ICA. Atheromatous changes to medium vessels. There is a notable high-grade narrowing at the right A2 level. Severe narrowing of the left lower M2 branch seen on reformats. Posterior circulation: Right dominant vertebral artery. Atheromatous plaque at both V4 segments with 50-60% narrowing of the left V4 segment. The basilar artery is smooth and diffusely patent. Left P2 branch occlusion with subsequent reconstitution and then subsequent severe stenosis at the PCA bifurcation. Negative for aneurysm. Venous sinuses: Diffusely patent Anatomic variants: None significant Review of the MIP images confirms the above findings These results were called by telephone at the time of interpretation on 09/01/2020 at 7:48 am to provider [facility] , who verbally acknowledged these results. IMPRESSION: 1. Confirmed left PCA occlusion at the left P2 segment with downstream reconstitution followed by a severe stenosis at the left PCA bifurcation. 2. Intracranial atherosclerosis with notable right A2, left M2, and left V4 segment stenoses. 3. Atherosclerosis in the neck without flow limiting stenosis. 4. Left brachiocephalic vein stenosis. Electronically Signed   By: Monte Fantasia M.D.   On: 09/01/2020 07:49   MR ANGIO HEAD WO CONTRAST  Result Date: 09/02/2020 CLINICAL DATA:  Stroke follow-up status post thrombectomy. EXAM: MRI HEAD WITHOUT CONTRAST MRA HEAD WITHOUT CONTRAST TECHNIQUE: Multiplanar, multiecho pulse sequences of the brain and surrounding structures were obtained  without intravenous contrast. Angiographic images of the head were obtained using MRA technique without contrast. COMPARISON:  Head CT 09/01/2020 FINDINGS: MRI HEAD FINDINGS Brain: Left PCA territory acute ischemia involving the left occipital lobe, medial left temporal lobe and left thalamus. There is also punctate focus  of abnormal diffusion restriction at the base of the right precentral gyrus. Focus of chronic microhemorrhage in the brainstem. No acute hemorrhage. There is multifocal hyperintense T2-weighted signal within the white matter. Parenchymal volume and CSF spaces are normal. The midline structures are normal. Vascular: Major flow voids are preserved. Skull and upper cervical spine: Normal calvarium and skull base. Visualized upper cervical spine and soft tissues are normal. Sinuses/Orbits:No paranasal sinus fluid levels or advanced mucosal thickening. No mastoid or middle ear effusion. Normal orbits. MRA HEAD FINDINGS POSTERIOR CIRCULATION: --Vertebral arteries: Normal --Inferior cerebellar arteries: Normal. --Basilar artery: Normal. --Superior cerebellar arteries: Normal. --Posterior cerebral arteries: There is now a stent in the left PCA P1 and P2 segments. There is normal distal flow related enhancement. Right PCA is normal. ANTERIOR CIRCULATION: --Intracranial internal carotid arteries: Normal. --Anterior cerebral arteries (ACA): Normal. --Middle cerebral arteries (MCA): Severe stenosis of the proximal M2 segment inferior division of the left MCA. No stenosis of the right MCA. ANATOMIC VARIANTS: None IMPRESSION: 1. Left PCA territory acute ischemia involving the left occipital lobe, medial left temporal lobe and left thalamus. No acute hemorrhage or mass effect. 2. Punctate focus of acute ischemia at the base of the right precentral gyrus. 3. Severe stenosis of the proximal M2 segment inferior division of the left MCA. 4. Status post stent placement of left PCA P1 and P2 segments with normal distal  flow related enhancement. Electronically Signed   By: Ulyses Jarred M.D.   On: 09/02/2020 03:38   MR BRAIN WO CONTRAST  Result Date: 09/02/2020 CLINICAL DATA:  Stroke follow-up status post thrombectomy. EXAM: MRI HEAD WITHOUT CONTRAST MRA HEAD WITHOUT CONTRAST TECHNIQUE: Multiplanar, multiecho pulse sequences of the brain and surrounding structures were obtained without intravenous contrast. Angiographic images of the head were obtained using MRA technique without contrast. COMPARISON:  Head CT 09/01/2020 FINDINGS: MRI HEAD FINDINGS Brain: Left PCA territory acute ischemia involving the left occipital lobe, medial left temporal lobe and left thalamus. There is also punctate focus of abnormal diffusion restriction at the base of the right precentral gyrus. Focus of chronic microhemorrhage in the brainstem. No acute hemorrhage. There is multifocal hyperintense T2-weighted signal within the white matter. Parenchymal volume and CSF spaces are normal. The midline structures are normal. Vascular: Major flow voids are preserved. Skull and upper cervical spine: Normal calvarium and skull base. Visualized upper cervical spine and soft tissues are normal. Sinuses/Orbits:No paranasal sinus fluid levels or advanced mucosal thickening. No mastoid or middle ear effusion. Normal orbits. MRA HEAD FINDINGS POSTERIOR CIRCULATION: --Vertebral arteries: Normal --Inferior cerebellar arteries: Normal. --Basilar artery: Normal. --Superior cerebellar arteries: Normal. --Posterior cerebral arteries: There is now a stent in the left PCA P1 and P2 segments. There is normal distal flow related enhancement. Right PCA is normal. ANTERIOR CIRCULATION: --Intracranial internal carotid arteries: Normal. --Anterior cerebral arteries (ACA): Normal. --Middle cerebral arteries (MCA): Severe stenosis of the proximal M2 segment inferior division of the left MCA. No stenosis of the right MCA. ANATOMIC VARIANTS: None IMPRESSION: 1. Left PCA territory  acute ischemia involving the left occipital lobe, medial left temporal lobe and left thalamus. No acute hemorrhage or mass effect. 2. Punctate focus of acute ischemia at the base of the right precentral gyrus. 3. Severe stenosis of the proximal M2 segment inferior division of the left MCA. 4. Status post stent placement of left PCA P1 and P2 segments with normal distal flow related enhancement. Electronically Signed   By: Ulyses Jarred M.D.   On: 09/02/2020 03:38  CT CEREBRAL PERFUSION W CONTRAST  Result Date: 09/01/2020 CLINICAL DATA:  Right-sided facial droop and weakness. Left P2 occlusion on CTA. EXAM: CT PERFUSION BRAIN TECHNIQUE: Multiphase CT imaging of the brain was performed following IV bolus contrast injection. Subsequent parametric perfusion maps were calculated using RAPID software. CONTRAST:  23mL OMNIPAQUE IOHEXOL 350 MG/ML SOLN COMPARISON:  Head and neck CTA earlier today FINDINGS: CT Brain Perfusion Findings: CBF (<30%) Volume: 0 mL Perfusion (Tmax>6.0s) volume: 42 mL Mismatch Volume: 42 mL Infarction Location: The small acute left temporo-occipital infarct on today's noncontrast CT was not detected by automated CTP processing. There is penumbra in the left PCA territory although the reported mismatch volume also includes tissue in both cerebellar hemispheres. IMPRESSION: Left PCA penumbra which is smaller than the reported 42 mL mismatch volume as that also includes bilateral cerebellar tissue. These results were communicated to Dr. Rory Percy at 8:43 am on 09/01/2020 by text page via the [facility] messaging system. Electronically Signed   By: Logan Bores M.D.   On: 09/01/2020 08:47   DG CHEST PORT 1 VIEW  Result Date: 09/02/2020 CLINICAL DATA:  Stroke. EXAM: PORTABLE CHEST 1 VIEW COMPARISON:  09/01/2020.  07/18/2018 FINDINGS: Interim extubation and removal of NG tube. Stable cardiomegaly. No pulmonary venous congestion. No focal infiltrate. Questionable density right upper lung possibly a small  area of atelectasis. Follow-up PA and lateral chest x-ray suggested pulmonary lesion. Tiny calcified pulmonary nodule in the right again noted consistent with calcified granuloma. Prominent hiatal hernia again noted. Degenerative changes scoliosis thoracic spine. IMPRESSION: 1.  Interim extubation and removal of NG tube. 2.  Stable cardiomegaly. 3. Questionable density right upper lung, possibly a small area of atelectasis. Follow-up PA lateral chest x-ray suggested to exclude a focal pulmonary lesion. 4.  Prominent hiatal hernia again noted. Electronically Signed   By: Marcello Moores  Register   On: 09/02/2020 06:29   DG Chest Port 1 View  Result Date: 09/01/2020 CLINICAL DATA:  Endotracheal tube placement. EXAM: PORTABLE CHEST 1 VIEW COMPARISON:  July 18, 2018. FINDINGS: The heart size and mediastinal contours are within normal limits. Endotracheal tube is in grossly good position. Nasogastric tube is looped within the hiatal hernia. No pneumothorax is noted. Left lung is clear. Mild right basilar subsegmental atelectasis is noted. The visualized skeletal structures are unremarkable. IMPRESSION: Endotracheal tube in grossly good position. Nasogastric tube tip is seen within the hiatal hernia. Mild right basilar subsegmental atelectasis. Electronically Signed   By: Marijo Conception M.D.   On: 09/01/2020 16:26   ECHOCARDIOGRAM COMPLETE  Result Date: 09/01/2020    ECHOCARDIOGRAM REPORT   Patient Name:   JAQUAYLA HEGE [facility] Date of Exam: 09/01/2020 Medical Rec #:  [id number]       Height:       63.0 in Accession #:    [id number]      Weight:       150.0 lb Date of Birth:  1929/04/18       BSA:          49.711 m Patient Age:    60 years        BP:           133/60 mmHg Patient Gender: F               HR:           86 bpm. Exam Location:  Inpatient Procedure: 2D Echo, Color Doppler and Cardiac Doppler Indications:    Stroke I63.9  History:  Patient has no prior history of Echocardiogram examinations.                 Risk  Factors:Hypertension and Dyslipidemia.  Sonographer:    Darlina Sicilian RDCS Referring Phys: [id number] [facility]  Sonographer Comments: Suboptimal parasternal window, suboptimal apical window and echo performed with patient supine and on artificial respirator. IMPRESSIONS  1. Left ventricular ejection fraction, by estimation, is 60 to 65%. The left ventricle has normal function. The left ventricle has no regional wall motion abnormalities. Left ventricular diastolic parameters are consistent with Grade I diastolic dysfunction (impaired relaxation).  2. Right ventricular systolic function is normal. The right ventricular size is normal. There is mildly elevated pulmonary artery systolic pressure.  3. The mitral valve is normal in structure. No evidence of mitral valve regurgitation. No evidence of mitral stenosis.  4. The aortic valve is normal in structure. Aortic valve regurgitation is not visualized. No aortic stenosis is present.  5. The inferior vena cava is normal in size with greater than 50% respiratory variability, suggesting right atrial pressure of 3 mmHg. FINDINGS  Left Ventricle: Left ventricular ejection fraction, by estimation, is 60 to 65%. The left ventricle has normal function. The left ventricle has no regional wall motion abnormalities. The left ventricular internal cavity size was normal in size. There is  no left ventricular hypertrophy. Left ventricular diastolic parameters are consistent with Grade I diastolic dysfunction (impaired relaxation). Normal left ventricular filling pressure. Right Ventricle: The right ventricular size is normal. No increase in right ventricular wall thickness. Right ventricular systolic function is normal. There is mildly elevated pulmonary artery systolic pressure. The tricuspid regurgitant velocity is 2.84  m/s, and with an assumed right atrial pressure of 8 mmHg, the estimated right ventricular systolic pressure is 81.1 mmHg. Left Atrium: Left atrial size was  normal in size. Right Atrium: Right atrial size was normal in size. Pericardium: There is no evidence of pericardial effusion. Mitral Valve: The mitral valve is normal in structure. No evidence of mitral valve regurgitation. No evidence of mitral valve stenosis. Tricuspid Valve: The tricuspid valve is normal in structure. Tricuspid valve regurgitation is mild . No evidence of tricuspid stenosis. Aortic Valve: The aortic valve is normal in structure. Aortic valve regurgitation is not visualized. No aortic stenosis is present. Pulmonic Valve: The pulmonic valve was normal in structure. Pulmonic valve regurgitation is not visualized. No evidence of pulmonic stenosis. Aorta: The aortic root is normal in size and structure. Venous: IVC assessment for right atrial pressure unable to be performed due to mechanical ventilation. The inferior vena cava is normal in size with greater than 50% respiratory variability, suggesting right atrial pressure of 3 mmHg. IAS/Shunts: No atrial level shunt detected by color flow Doppler.  LEFT VENTRICLE PLAX 2D LVIDd:         3.80 cm  Diastology LVIDs:         2.30 cm  LV e' medial:    6.31 cm/s LV PW:         0.90 cm  LV E/e' medial:  9.4 LV IVS:        0.90 cm  LV e' lateral:   9.14 cm/s LVOT diam:     2.00 cm  LV E/e' lateral: 6.5 LV SV:         54 LV SV Index:   32 LVOT Area:     3.14 cm  RIGHT VENTRICLE RV S prime:     12.90 cm/s LEFT ATRIUM  Index LA diam:        3.30 cm 1.93 cm/m LA Vol (A2C):   30.6 ml 17.88 ml/m LA Vol (A4C):   47.0 ml 27.47 ml/m LA Biplane Vol: 40.8 ml 23.84 ml/m  AORTIC VALVE LVOT Vmax:   95.80 cm/s LVOT Vmean:  55.500 cm/s LVOT VTI:    0.172 m  AORTA Ao Root diam: 2.90 cm MITRAL VALVE               TRICUSPID VALVE MV Area (PHT): 5.54 cm    TR Peak grad:   32.3 mmHg MV Decel Time: 137 msec    TR Vmax:        284.00 cm/s MV E velocity: 59.60 cm/s MV A velocity: 87.00 cm/s  SHUNTS MV E/A ratio:  0.69        Systemic VTI:  0.17 m                             Systemic Diam: 2.00 cm Dani Gobble Croitoru MD Electronically signed by Sanda Klein MD Signature Date/Time: 09/01/2020/2:22:50 PM    Final    CT HEAD CODE STROKE WO CONTRAST  Result Date: 09/01/2020 CLINICAL DATA:  Code stroke. Right-sided facial droop and weakness since 10 p.m. EXAM: CT HEAD WITHOUT CONTRAST TECHNIQUE: Contiguous axial images were obtained from the base of the skull through the vertex without intravenous contrast. COMPARISON:  Brain MRI 08/23/2013 FINDINGS: Brain: Subtle low-density in the left temporal occipital cortex. No other visible infarct. No hemorrhage, hydrocephalus, or collection. Vascular: Atheromatous calcification. Especially on coronal imaging the left P 2 or P3 segment appears dense Skull: Normal. Negative for fracture or focal lesion. Sinuses/Orbits: Negative Other: These results were called by telephone at the time of interpretation on 09/01/2020 at 7:04 am to provider Dr Charna Archer, who verbally acknowledged these results. IMPRESSION: 1. Small acute infarct in the left occipital parietal cortex. The adjacent PCA is hyperdense on reformats, suggesting acute thrombosis. The visible infarct is smaller than would be expected for the level of vessel hyperdensity, consider CTA/perfusion if a treatment candidate. 2. No intracranial hemorrhage. Electronically Signed   By: Monte Fantasia M.D.   On: 09/01/2020 07:08    Labs:  CBC: Recent Labs    01/21/20 1041 09/01/20 0654 09/01/20 1511 09/02/20 0435 09/02/20 0436  WBC 5.9 5.7  --  10.3  --   HGB 14.4 14.1 14.3 12.5 11.9*  HCT 41.3 42.2 42.0 38.4 35.0*  PLT 233 256  --  240  --     COAGS: Recent Labs    09/01/20 0654  INR 1.0  APTT 36    BMP: Recent Labs    01/21/20 1041 09/01/20 0654 09/01/20 1511 09/02/20 0435 09/02/20 0436  NA 139 137 138 137 139  K 4.5 4.7 3.6 3.6 3.5  CL 104 107  --  108  --   CO2 21 23  --  21*  --   GLUCOSE 100* 122*  --  116*  --   BUN 14 12  --  9  --   CALCIUM 9.8 9.0   --  8.5*  --   CREATININE 0.90 0.72  --  0.65  --   GFRNONAA 57* >60  --  >60  --   GFRAA 66  --   --   --   --     LIVER FUNCTION TESTS: Recent Labs    01/21/20 1041 09/01/20 0654  BILITOT 0.4  1.2  AST 17 24  ALT 10 12  ALKPHOS 74 75  PROT 6.2 6.4*  ALBUMIN 4.1 3.7    Assessment and Plan:  Acute Left P2 occlusion.  S/P cerebral angiography by Dr. Estanislado Pandy with successful intervention/thrombectomy of the left PCA.  Recommend 81 mg ASA and Brilinta BID x at  Least 3 months. I will send in her Brilinta to her pharmacy.   F/U with Dr. Estanislado Pandy in 3 months. IR schedulers will call patient with appointment details.  Electronically Signed: Murrell Redden, PA-C 09/02/2020, 10:57 AM    I spent a total of 15 Minutes at the the patient's bedside AND on the patient's hospital floor or unit, greater than 50% of which was counseling/coordinating care for stroke with cerebral intervention.

## 2020-09-02 NOTE — Consult Note (Signed)
Physical Medicine and Rehabilitation Consult   Reason for Consult: Stroke with functional deficits Referring Physician: Dr. Erlinda Hong    HPI: Deanna Williams is a 87 y.o. female with history of HTN, palpitations who was admitted via [facility] on 09/01/20 with right sided weakness with numbness.  CT head showed small acute infarct in left occipital parietal cortex with hyperdense signal suggesting acute thrombosis. She underwent cerebral angio with thrombectomy and rescue stent for reocclusion by Dr. Estanislado Pandy. MRI brain done for follow up and revealed L-PCA ischemia involving occipital, medial temporal and thalamus. 2D echo showed EF 60-65% with no wall abnormality and plans for loop recorder in am. Therapy evaluations in progress and patient limited by weakness with mild cognitive deficits. CIR recommended due to functional decline. Left hand edematous and mildly painful.    Review of Systems  Constitutional: Negative for fever.  HENT: Negative for hearing loss.   Eyes: Positive for blurred vision (wears glasses).  Respiratory: Negative for cough and shortness of breath.   Cardiovascular: Negative for chest pain and palpitations.  Gastrointestinal: Negative for constipation, heartburn and nausea.  Genitourinary: Negative for dysuria and urgency.  Musculoskeletal: Negative for joint pain and myalgias.  Skin: Negative for rash.  Neurological: Positive for sensory change and focal weakness. Negative for dizziness and headaches.  Psychiatric/Behavioral: Negative for memory loss.      Past Medical History:  Diagnosis Date  . Gastroesophageal reflux disease   . Hyperlipidemia   . Hypertension   . Lichen sclerosus   . Lichen sclerosus et atrophicus   . Osteopenia   . Palpitations     Past Surgical History:  Procedure Laterality Date  . CARPAL TUNNEL RELEASE    . FOOT SURGERY    . HYSTEROSCOPY WITH D & C N/A 11/02/2015   Procedure: DILATATION AND CURETTAGE /HYSTEROSCOPY;  Surgeon:  Brayton Mars, MD;  Location: [facility];  Service: Gynecology;  Laterality: N/A;  . RADIOLOGY WITH ANESTHESIA N/A 09/01/2020   Procedure: IR WITH ANESTHESIA;  Surgeon: Radiologist, Medication, MD;  Location: [facility];  Service: Radiology;  Laterality: N/A;    Family History  Problem Relation Age of Onset  . Cancer Mother   . Leukemia Mother   . Cancer Father        colon  . Hypertension Father   . Arthritis Sister   . Breast cancer Neg Hx     Social History:  Widowed. Used to work in a [employer]. She has a house at [facility] facility and was active PTA (cooks, drives other residents to MD offices). She reports that she has never smoked. She has never used smokeless tobacco. She reports current alcohol use--a mixed drink occasionally. She reports that she does not use drugs.    Allergies: No Known Allergies    Medications Prior to Admission  Medication Sig Dispense Refill  . conjugated estrogens (PREMARIN) vaginal cream Place 1 Applicatorful vaginally daily. (Patient not taking: Reported on 09/01/2020) 42.5 g 12  . omeprazole (PRILOSEC) 20 MG capsule Take 1 capsule (20 mg total) by mouth daily. (Patient not taking: Reported on 09/01/2020) 30 capsule 3  . terconazole (TERAZOL 3) 0.8 % vaginal cream Place 1 applicator vaginally at bedtime. (Patient not taking: No sig reported) 20 g 3    Home: Home Living Family/patient expects to be discharged to:: Private residence Living Arrangements: Alone Available Help at Discharge: Family Type of Home: Independent living facility Home Access: Level entry [place]: One level  Bathroom Shower/Tub: Multimedia programmer: Handicapped height Home Equipment: Grab bars - toilet,Grab bars - tub/shower,Hand held shower head,None Additional Comments: Pt lives in a retirement community with her yorkie  Lives With: Alone  Functional History: Prior Function Level of Independence: Independent Comments: drives, grocery shops,  cooks, walks her dog Functional Status:  Mobility: Bed Mobility Overal bed mobility: Needs Assistance Bed Mobility: Supine to Sit Supine to sit: Min assist General bed mobility comments: assist for balance Transfers Overall transfer level: Needs assistance Equipment used: 2 person hand held assist,Rolling walker (2 wheeled) Transfers: Sit to/from [place] to Stand: Mod assist,+2 physical assistance,+2 safety/equipment Stand pivot transfers: Mod assist,+2 physical assistance,+2 safety/equipment General transfer comment: Pt with heavy Rt lateral lean with Rt knee buckling.  She requires facilitation for Rt knee and hip extension. Pt also with R ankle instability, needing intermittent blocking to prevent inversion injury. Cues for hand placement with use of RW. Ambulation/Gait Ambulation/Gait assistance: Mod assist,+2 physical assistance,+2 safety/equipment Gait Distance (Feet): 3 Feet Assistive device: Rolling walker (2 wheeled) Gait Pattern/deviations: Step-through pattern,Decreased step length - right,Decreased step length - left,Decreased stride length,Decreased weight shift to right,Decreased weight shift to left,Decreased dorsiflexion - right,Trunk flexed (leans R)    ADL: ADL Overall ADL's : Needs assistance/impaired Eating/Feeding: Modified independent,Sitting,Bed level Grooming: Wash/dry hands,Wash/dry face,Oral care,Brushing hair,Set up,Supervision/safety,Sitting Grooming Details (indicate cue type and reason): looses balance to the Rt Upper Body Bathing: Minimal assistance,Sitting Upper Body Bathing Details (indicate cue type and reason): for sitting balance Lower Body Bathing: Moderate assistance,Sit to/from stand Upper Body Dressing : Moderate assistance,Sitting Lower Body Dressing: Moderate assistance,Sit to/from stand Lower Body Dressing Details (indicate cue type and reason): Pt looses balance to the Rt with poor awareness and with decreased  righting reaction.  She requires mod A for sitting balance.  Assist to pull socks over her feet Toilet Transfer: Moderate assistance,+2 for physical assistance,+2 for safety/equipment,Stand-pivot,BSC,RW Toilet Transfer Details (indicate cue type and reason): assist for balance and walker safety Toileting- Clothing Manipulation and Hygiene: Moderate assistance,+2 for physical assistance,+2 for safety/equipment,Sit to/from stand Toileting - Clothing Manipulation Details (indicate cue type and reason): Pt leans heavily to the Rt Functional mobility during ADLs: Moderate assistance,+2 for physical assistance,+2 for safety/equipment,Rolling walker  Cognition: Cognition Overall Cognitive Status: Impaired/Different from baseline Arousal/Alertness: Awake/alert Orientation Level: Oriented X4 Attention: Focused,Sustained,Alternating Focused Attention: Appears intact Sustained Attention: Impaired Sustained Attention Impairment: Verbal complex,Functional complex Alternating Attention: Impaired Alternating Attention Impairment: Verbal complex,Functional complex Memory: Impaired Memory Impairment: Decreased recall of new information,Decreased short term memory Decreased Short Term Memory: Verbal complex Awareness: Impaired Problem Solving: Impaired Problem Solving Impairment: Verbal complex,Functional complex Executive Function: Organizing,Self Monitoring Organizing: Impaired Organizing Impairment: Verbal complex,Functional complex Self Monitoring: Impaired Self Monitoring Impairment: Verbal complex,Functional complex Safety/Judgment: Impaired Cognition Arousal/Alertness: Awake/alert Behavior During Therapy: WFL for tasks assessed/performed Overall Cognitive Status: Impaired/Different from baseline Area of Impairment: Attention,Following commands,Awareness,Problem solving Current Attention Level: Selective Following Commands: Follows one step commands consistently,Follows multi-step commands  inconsistently Awareness: Intellectual Problem Solving: Slow processing,Difficulty sequencing,Requires verbal cues,Requires tactile cues General Comments: Pt with poor awareness of deficits and impact on function, needing repeated education on deficits and cause.   Blood pressure 121/70, pulse 75, temperature 98.4 F (36.9 C), temperature source Oral, resp. rate 20, SpO2 93 %. Physical Exam Gen: no distress, normal appearing HEENT: oral mucosa pink and moist, NCAT Cardio: Reg rate Chest: normal effort, normal rate of breathing Abd: soft, non-distended Ext: no edema Psych: pleasant, normal affect Skin: intact Musculoskeletal:  Comments: Left hand with 2-3+ edema with ecchymosis.   Neurological:     Mental Status: She is alert and oriented to person, place, and time.     Comments: Left facial weakness with ptosis. Speech clear.  LLE>LUE weakness (4/5) with sensory deficits.     Results for orders placed or performed during the hospital encounter of 09/01/20 (from the past 24 hour(s))  I-STAT 7, (LYTES, BLD GAS, ICA, H+H)     Status: Abnormal   Collection Time: 09/01/20  3:11 PM  Result Value Ref Range   pH, Arterial 7.351 7.350 - 7.450   pCO2 arterial 42.0 32.0 - 48.0 mmHg   pO2, Arterial 248 (H) 83.0 - 108.0 mmHg   Bicarbonate 23.4 20.0 - 28.0 mmol/L   TCO2 25 22 - 32 mmol/L   O2 Saturation 100.0 %   Acid-base deficit 2.0 0.0 - 2.0 mmol/L   Sodium 138 135 - 145 mmol/L   Potassium 3.6 3.5 - 5.1 mmol/L   Calcium, Ion 1.18 1.15 - 1.40 mmol/L   HCT 42.0 36.0 - 46.0 %   Hemoglobin 14.3 12.0 - 15.0 g/dL   Patient temperature 97.7 F    Collection site Magazine features editor by Operator    Sample type ARTERIAL   Hemoglobin A1c     Status: Abnormal   Collection Time: 09/02/20  4:35 AM  Result Value Ref Range   Hgb A1c MFr Bld 5.9 (H) 4.8 - 5.6 %   Mean Plasma Glucose 122.63 mg/dL  Basic metabolic panel     Status: Abnormal   Collection Time: 09/02/20  4:35 AM  Result Value Ref  Range   Sodium 137 135 - 145 mmol/L   Potassium 3.6 3.5 - 5.1 mmol/L   Chloride 108 98 - 111 mmol/L   CO2 21 (L) 22 - 32 mmol/L   Glucose, Bld 116 (H) 70 - 99 mg/dL   BUN 9 8 - 23 mg/dL   Creatinine, Ser 0.65 0.44 - 1.00 mg/dL   Calcium 8.5 (L) 8.9 - 10.3 mg/dL   GFR, Estimated >60 >60 mL/min   Anion gap 8 5 - 15  CBC     Status: None   Collection Time: 09/02/20  4:35 AM  Result Value Ref Range   WBC 10.3 4.0 - 10.5 K/uL   RBC 4.02 3.87 - 5.11 MIL/uL   Hemoglobin 12.5 12.0 - 15.0 g/dL   HCT 38.4 36.0 - 46.0 %   MCV 95.5 80.0 - 100.0 fL   MCH 31.1 26.0 - 34.0 pg   MCHC 32.6 30.0 - 36.0 g/dL   RDW 12.5 11.5 - 15.5 %   Platelets 240 150 - 400 K/uL   nRBC 0.0 0.0 - 0.2 %  Magnesium     Status: None   Collection Time: 09/02/20  4:35 AM  Result Value Ref Range   Magnesium 1.9 1.7 - 2.4 mg/dL  Phosphorus     Status: None   Collection Time: 09/02/20  4:35 AM  Result Value Ref Range   Phosphorus 2.6 2.5 - 4.6 mg/dL  I-STAT 7, (LYTES, BLD GAS, ICA, H+H)     Status: Abnormal   Collection Time: 09/02/20  4:36 AM  Result Value Ref Range   pH, Arterial 7.392 7.350 - 7.450   pCO2 arterial 36.3 32.0 - 48.0 mmHg   pO2, Arterial 77 (L) 83.0 - 108.0 mmHg   Bicarbonate 22.1 20.0 - 28.0 mmol/L   TCO2 23 22 - 32 mmol/L   O2  Saturation 95.0 %   Acid-base deficit 2.0 0.0 - 2.0 mmol/L   Sodium 139 135 - 145 mmol/L   Potassium 3.5 3.5 - 5.1 mmol/L   Calcium, Ion 1.24 1.15 - 1.40 mmol/L   HCT 35.0 (L) 36.0 - 46.0 %   Hemoglobin 11.9 (L) 12.0 - 15.0 g/dL   Patient temperature 98.8 F    Collection site art line    Drawn by HIDE    Sample type ARTERIAL   Lipid panel     Status: None   Collection Time: 09/02/20  4:41 AM  Result Value Ref Range   Cholesterol 188 0 - 200 mg/dL   Triglycerides 68 <150 mg/dL   HDL 75 >40 mg/dL   Total CHOL/HDL Ratio 2.5 RATIO   VLDL 14 0 - 40 mg/dL   LDL Cholesterol 99 0 - 99 mg/dL  Glucose, capillary     Status: Abnormal   Collection Time: 09/02/20  7:26  AM  Result Value Ref Range   Glucose-Capillary 107 (H) 70 - 99 mg/dL  Glucose, capillary     Status: Abnormal   Collection Time: 09/02/20 11:30 AM  Result Value Ref Range   Glucose-Capillary 128 (H) 70 - 99 mg/dL   CT Angio Head W or Wo Contrast  Result Date: 09/01/2020 CLINICAL DATA:  Right-sided facial droop and weakness since 10 p.m. EXAM: CT ANGIOGRAPHY HEAD AND NECK TECHNIQUE: Multidetector CT imaging of the head and neck was performed using the standard protocol during bolus administration of intravenous contrast. Multiplanar CT image reconstructions and MIPs were obtained to evaluate the vascular anatomy. Carotid stenosis measurements (when applicable) are obtained utilizing NASCET criteria, using the distal internal carotid diameter as the denominator. CONTRAST:  46mL OMNIPAQUE IOHEXOL 350 MG/ML SOLN COMPARISON:  Noncontrast head CT from earlier today FINDINGS: CTA NECK FINDINGS Aortic arch: Atheromatous plaque.  Three vessel branching. Right carotid system: Atheromatous wall thickening diffusely. Calcified plaque at the bifurcation without flow limiting stenosis or ulceration. Left carotid system: Circumferential atheromatous wall thickening. Calcified plaque at the bifurcation without ulceration or flow limiting stenosis. Vertebral arteries: No proximal subclavian flow limiting stenosis. Right dominant vertebral artery. Both vertebral arteries are patent to the dura. There is limitation to visualization of the proximal vertebral lumen on both sides due to artifact from intravenous contrast. Skeleton: Cervical spine degeneration with C3-4 and C4-5 anterolisthesis. Other neck: Extensive intravenous reflux of contrast. There is a stenotic appearance of the left brachiocephalic vein. The bolus dispersion cause a sub optimal opacification of arterial structures. Upper chest: Negative Review of the MIP images confirms the above findings CTA HEAD FINDINGS Anterior circulation: Atheromatous calcification  at the carotid siphons. Hypoplastic left A1 segment with mildly larger right ICA. Atheromatous changes to medium vessels. There is a notable high-grade narrowing at the right A2 level. Severe narrowing of the left lower M2 branch seen on reformats. Posterior circulation: Right dominant vertebral artery. Atheromatous plaque at both V4 segments with 50-60% narrowing of the left V4 segment. The basilar artery is smooth and diffusely patent. Left P2 branch occlusion with subsequent reconstitution and then subsequent severe stenosis at the PCA bifurcation. Negative for aneurysm. Venous sinuses: Diffusely patent Anatomic variants: None significant Review of the MIP images confirms the above findings These results were called by telephone at the time of interpretation on 09/01/2020 at 7:48 am to provider [facility] , who verbally acknowledged these results. IMPRESSION: 1. Confirmed left PCA occlusion at the left P2 segment with downstream reconstitution followed by a severe  stenosis at the left PCA bifurcation. 2. Intracranial atherosclerosis with notable right A2, left M2, and left V4 segment stenoses. 3. Atherosclerosis in the neck without flow limiting stenosis. 4. Left brachiocephalic vein stenosis. Electronically Signed   By: Monte Fantasia M.D.   On: 09/01/2020 07:49   CT HEAD WO CONTRAST  Result Date: 09/01/2020 CLINICAL DATA:  Stroke follow-up. EXAM: CT HEAD WITHOUT CONTRAST TECHNIQUE: Contiguous axial images were obtained from the base of the skull through the vertex without intravenous contrast. COMPARISON:  Same day CT head. FINDINGS: Brain: Similar subtle hypodensity in the left temporal occipital cortex. Areas of subtle cortical hyperdensity described on the recent prior have resolved and likely represented contrast staining recent catheter arteriogram. No evidence of new acute large vascular territory infarct. No acute hemorrhage. No progressive mass effect. No midline shift. Basal cisterns are  patent. No hydrocephalus. No mass lesion. No visible extra-axial fluid collections. Vascular: Left PCA stent. Vasculature better characterized on same day catheter arteriogram. Skull: No acute fracture. Sinuses/Orbits: No acute abnormality. Other: Small left mastoid effusion. IMPRESSION: 1. Similar suspected small acute infarct in the left PCA territory. No progressive mass effect. MRI could further characterize if clinically indicated. 2. Areas of subtle cortical hyperdensity described on the recent prior have resolved and likely represented contrast staining from recent catheter arteriogram. No evidence of acute hemorrhage. Electronically Signed   By: Margaretha Sheffield MD   On: 09/01/2020 16:16   CT HEAD WO CONTRAST  Result Date: 09/01/2020 CLINICAL DATA:  Stroke follow-up post intervention. EXAM: CT HEAD WITHOUT CONTRAST TECHNIQUE: Contiguous axial images were obtained from the base of the skull through the vertex without intravenous contrast. COMPARISON:  Same day CT exams. FINDINGS: Brain: Similar subtle loaded in the left temporal occipital cortex. No evidence of mass occupying acute hemorrhage. Subtle/faint cortical versus extra-axial hyperdensity in the posteroinferior right temporal and occipital lobes (for example see series 6, image 29 and image 19). Evaluation for small volume subarachnoid hemorrhage is somewhat limited due to residual contrast within the vasculature. No hydrocephalus. No midline shift. Basal cisterns are patent. No mass lesion. Vascular: Residual contrast is noted within the vasculature from same day contrasted studies. Vasculature is better evaluated on same day catheter arteriogram. Skull: No acute fracture. Sinuses/Orbits: No acute findings. Other: Mastoid effusions. IMPRESSION: 1. No substantial change in small acute infarct in the left occipital parietal cortex. No progressive mass effect 2. Subtle/faint cortical versus extra-axial hyperdensity in the posteroinferior right  temporal and occipital lobes, which could represent contrast staining given recent catheter arteriogram versus trace hemorrhage. No mass occupying acute hemorrhage. Recommend attention on close follow-up. Electronically Signed   By: Margaretha Sheffield MD   On: 09/01/2020 12:49   CT Angio Neck W and/or Wo Contrast  Result Date: 09/01/2020 CLINICAL DATA:  Right-sided facial droop and weakness since 10 p.m. EXAM: CT ANGIOGRAPHY HEAD AND NECK TECHNIQUE: Multidetector CT imaging of the head and neck was performed using the standard protocol during bolus administration of intravenous contrast. Multiplanar CT image reconstructions and MIPs were obtained to evaluate the vascular anatomy. Carotid stenosis measurements (when applicable) are obtained utilizing NASCET criteria, using the distal internal carotid diameter as the denominator. CONTRAST:  74mL OMNIPAQUE IOHEXOL 350 MG/ML SOLN COMPARISON:  Noncontrast head CT from earlier today FINDINGS: CTA NECK FINDINGS Aortic arch: Atheromatous plaque.  Three vessel branching. Right carotid system: Atheromatous wall thickening diffusely. Calcified plaque at the bifurcation without flow limiting stenosis or ulceration. Left carotid system: Circumferential atheromatous wall thickening.  Calcified plaque at the bifurcation without ulceration or flow limiting stenosis. Vertebral arteries: No proximal subclavian flow limiting stenosis. Right dominant vertebral artery. Both vertebral arteries are patent to the dura. There is limitation to visualization of the proximal vertebral lumen on both sides due to artifact from intravenous contrast. Skeleton: Cervical spine degeneration with C3-4 and C4-5 anterolisthesis. Other neck: Extensive intravenous reflux of contrast. There is a stenotic appearance of the left brachiocephalic vein. The bolus dispersion cause a sub optimal opacification of arterial structures. Upper chest: Negative Review of the MIP images confirms the above findings CTA  HEAD FINDINGS Anterior circulation: Atheromatous calcification at the carotid siphons. Hypoplastic left A1 segment with mildly larger right ICA. Atheromatous changes to medium vessels. There is a notable high-grade narrowing at the right A2 level. Severe narrowing of the left lower M2 branch seen on reformats. Posterior circulation: Right dominant vertebral artery. Atheromatous plaque at both V4 segments with 50-60% narrowing of the left V4 segment. The basilar artery is smooth and diffusely patent. Left P2 branch occlusion with subsequent reconstitution and then subsequent severe stenosis at the PCA bifurcation. Negative for aneurysm. Venous sinuses: Diffusely patent Anatomic variants: None significant Review of the MIP images confirms the above findings These results were called by telephone at the time of interpretation on 09/01/2020 at 7:48 am to provider [facility] , who verbally acknowledged these results. IMPRESSION: 1. Confirmed left PCA occlusion at the left P2 segment with downstream reconstitution followed by a severe stenosis at the left PCA bifurcation. 2. Intracranial atherosclerosis with notable right A2, left M2, and left V4 segment stenoses. 3. Atherosclerosis in the neck without flow limiting stenosis. 4. Left brachiocephalic vein stenosis. Electronically Signed   By: Monte Fantasia M.D.   On: 09/01/2020 07:49   MR ANGIO HEAD WO CONTRAST  Result Date: 09/02/2020 CLINICAL DATA:  Stroke follow-up status post thrombectomy. EXAM: MRI HEAD WITHOUT CONTRAST MRA HEAD WITHOUT CONTRAST TECHNIQUE: Multiplanar, multiecho pulse sequences of the brain and surrounding structures were obtained without intravenous contrast. Angiographic images of the head were obtained using MRA technique without contrast. COMPARISON:  Head CT 09/01/2020 FINDINGS: MRI HEAD FINDINGS Brain: Left PCA territory acute ischemia involving the left occipital lobe, medial left temporal lobe and left thalamus. There is also punctate  focus of abnormal diffusion restriction at the base of the right precentral gyrus. Focus of chronic microhemorrhage in the brainstem. No acute hemorrhage. There is multifocal hyperintense T2-weighted signal within the white matter. Parenchymal volume and CSF spaces are normal. The midline structures are normal. Vascular: Major flow voids are preserved. Skull and upper cervical spine: Normal calvarium and skull base. Visualized upper cervical spine and soft tissues are normal. Sinuses/Orbits:No paranasal sinus fluid levels or advanced mucosal thickening. No mastoid or middle ear effusion. Normal orbits. MRA HEAD FINDINGS POSTERIOR CIRCULATION: --Vertebral arteries: Normal --Inferior cerebellar arteries: Normal. --Basilar artery: Normal. --Superior cerebellar arteries: Normal. --Posterior cerebral arteries: There is now a stent in the left PCA P1 and P2 segments. There is normal distal flow related enhancement. Right PCA is normal. ANTERIOR CIRCULATION: --Intracranial internal carotid arteries: Normal. --Anterior cerebral arteries (ACA): Normal. --Middle cerebral arteries (MCA): Severe stenosis of the proximal M2 segment inferior division of the left MCA. No stenosis of the right MCA. ANATOMIC VARIANTS: None IMPRESSION: 1. Left PCA territory acute ischemia involving the left occipital lobe, medial left temporal lobe and left thalamus. No acute hemorrhage or mass effect. 2. Punctate focus of acute ischemia at the base of the right precentral  gyrus. 3. Severe stenosis of the proximal M2 segment inferior division of the left MCA. 4. Status post stent placement of left PCA P1 and P2 segments with normal distal flow related enhancement. Electronically Signed   By: Ulyses Jarred M.D.   On: 09/02/2020 03:38   MR BRAIN WO CONTRAST  Result Date: 09/02/2020 CLINICAL DATA:  Stroke follow-up status post thrombectomy. EXAM: MRI HEAD WITHOUT CONTRAST MRA HEAD WITHOUT CONTRAST TECHNIQUE: Multiplanar, multiecho pulse sequences of  the brain and surrounding structures were obtained without intravenous contrast. Angiographic images of the head were obtained using MRA technique without contrast. COMPARISON:  Head CT 09/01/2020 FINDINGS: MRI HEAD FINDINGS Brain: Left PCA territory acute ischemia involving the left occipital lobe, medial left temporal lobe and left thalamus. There is also punctate focus of abnormal diffusion restriction at the base of the right precentral gyrus. Focus of chronic microhemorrhage in the brainstem. No acute hemorrhage. There is multifocal hyperintense T2-weighted signal within the white matter. Parenchymal volume and CSF spaces are normal. The midline structures are normal. Vascular: Major flow voids are preserved. Skull and upper cervical spine: Normal calvarium and skull base. Visualized upper cervical spine and soft tissues are normal. Sinuses/Orbits:No paranasal sinus fluid levels or advanced mucosal thickening. No mastoid or middle ear effusion. Normal orbits. MRA HEAD FINDINGS POSTERIOR CIRCULATION: --Vertebral arteries: Normal --Inferior cerebellar arteries: Normal. --Basilar artery: Normal. --Superior cerebellar arteries: Normal. --Posterior cerebral arteries: There is now a stent in the left PCA P1 and P2 segments. There is normal distal flow related enhancement. Right PCA is normal. ANTERIOR CIRCULATION: --Intracranial internal carotid arteries: Normal. --Anterior cerebral arteries (ACA): Normal. --Middle cerebral arteries (MCA): Severe stenosis of the proximal M2 segment inferior division of the left MCA. No stenosis of the right MCA. ANATOMIC VARIANTS: None IMPRESSION: 1. Left PCA territory acute ischemia involving the left occipital lobe, medial left temporal lobe and left thalamus. No acute hemorrhage or mass effect. 2. Punctate focus of acute ischemia at the base of the right precentral gyrus. 3. Severe stenosis of the proximal M2 segment inferior division of the left MCA. 4. Status post stent  placement of left PCA P1 and P2 segments with normal distal flow related enhancement. Electronically Signed   By: Ulyses Jarred M.D.   On: 09/02/2020 03:38   CT CEREBRAL PERFUSION W CONTRAST  Result Date: 09/01/2020 CLINICAL DATA:  Right-sided facial droop and weakness. Left P2 occlusion on CTA. EXAM: CT PERFUSION BRAIN TECHNIQUE: Multiphase CT imaging of the brain was performed following IV bolus contrast injection. Subsequent parametric perfusion maps were calculated using RAPID software. CONTRAST:  28mL OMNIPAQUE IOHEXOL 350 MG/ML SOLN COMPARISON:  Head and neck CTA earlier today FINDINGS: CT Brain Perfusion Findings: CBF (<30%) Volume: 0 mL Perfusion (Tmax>6.0s) volume: 42 mL Mismatch Volume: 42 mL Infarction Location: The small acute left temporo-occipital infarct on today's noncontrast CT was not detected by automated CTP processing. There is penumbra in the left PCA territory although the reported mismatch volume also includes tissue in both cerebellar hemispheres. IMPRESSION: Left PCA penumbra which is smaller than the reported 42 mL mismatch volume as that also includes bilateral cerebellar tissue. These results were communicated to Dr. Rory Percy at 8:43 am on 09/01/2020 by text page via the [facility] messaging system. Electronically Signed   By: Logan Bores M.D.   On: 09/01/2020 08:47   DG CHEST PORT 1 VIEW  Result Date: 09/02/2020 CLINICAL DATA:  Stroke. EXAM: PORTABLE CHEST 1 VIEW COMPARISON:  09/01/2020.  07/18/2018 FINDINGS: Interim extubation and  removal of NG tube. Stable cardiomegaly. No pulmonary venous congestion. No focal infiltrate. Questionable density right upper lung possibly a small area of atelectasis. Follow-up PA and lateral chest x-ray suggested pulmonary lesion. Tiny calcified pulmonary nodule in the right again noted consistent with calcified granuloma. Prominent hiatal hernia again noted. Degenerative changes scoliosis thoracic spine. IMPRESSION: 1.  Interim extubation and removal of  NG tube. 2.  Stable cardiomegaly. 3. Questionable density right upper lung, possibly a small area of atelectasis. Follow-up PA lateral chest x-ray suggested to exclude a focal pulmonary lesion. 4.  Prominent hiatal hernia again noted. Electronically Signed   By: Marcello Moores  Register   On: 09/02/2020 06:29   DG Chest Port 1 View  Result Date: 09/01/2020 CLINICAL DATA:  Endotracheal tube placement. EXAM: PORTABLE CHEST 1 VIEW COMPARISON:  July 18, 2018. FINDINGS: The heart size and mediastinal contours are within normal limits. Endotracheal tube is in grossly good position. Nasogastric tube is looped within the hiatal hernia. No pneumothorax is noted. Left lung is clear. Mild right basilar subsegmental atelectasis is noted. The visualized skeletal structures are unremarkable. IMPRESSION: Endotracheal tube in grossly good position. Nasogastric tube tip is seen within the hiatal hernia. Mild right basilar subsegmental atelectasis. Electronically Signed   By: Marijo Conception M.D.   On: 09/01/2020 16:26   ECHOCARDIOGRAM COMPLETE  Result Date: 09/01/2020    ECHOCARDIOGRAM REPORT   Patient Name:   LAYLI CAPSHAW [facility] Date of Exam: 09/01/2020 Medical Rec #:  [id number]       Height:       63.0 in Accession #:    [id number]      Weight:       150.0 lb Date of Birth:  07-Aug-1928       BSA:          34.711 m Patient Age:    34 years        BP:           133/60 mmHg Patient Gender: F               HR:           86 bpm. Exam Location:  Inpatient Procedure: 2D Echo, Color Doppler and Cardiac Doppler Indications:    Stroke I63.9  History:        Patient has no prior history of Echocardiogram examinations.                 Risk Factors:Hypertension and Dyslipidemia.  Sonographer:    Darlina Sicilian RDCS Referring Phys: [id number] [facility]  Sonographer Comments: Suboptimal parasternal window, suboptimal apical window and echo performed with patient supine and on artificial respirator. IMPRESSIONS  1. Left ventricular ejection fraction, by  estimation, is 60 to 65%. The left ventricle has normal function. The left ventricle has no regional wall motion abnormalities. Left ventricular diastolic parameters are consistent with Grade I diastolic dysfunction (impaired relaxation).  2. Right ventricular systolic function is normal. The right ventricular size is normal. There is mildly elevated pulmonary artery systolic pressure.  3. The mitral valve is normal in structure. No evidence of mitral valve regurgitation. No evidence of mitral stenosis.  4. The aortic valve is normal in structure. Aortic valve regurgitation is not visualized. No aortic stenosis is present.  5. The inferior vena cava is normal in size with greater than 50% respiratory variability, suggesting right atrial pressure of 3 mmHg. FINDINGS  Left Ventricle: Left ventricular ejection fraction, by estimation, is 60 to 65%. The  left ventricle has normal function. The left ventricle has no regional wall motion abnormalities. The left ventricular internal cavity size was normal in size. There is  no left ventricular hypertrophy. Left ventricular diastolic parameters are consistent with Grade I diastolic dysfunction (impaired relaxation). Normal left ventricular filling pressure. Right Ventricle: The right ventricular size is normal. No increase in right ventricular wall thickness. Right ventricular systolic function is normal. There is mildly elevated pulmonary artery systolic pressure. The tricuspid regurgitant velocity is 2.84  m/s, and with an assumed right atrial pressure of 8 mmHg, the estimated right ventricular systolic pressure is 67.1 mmHg. Left Atrium: Left atrial size was normal in size. Right Atrium: Right atrial size was normal in size. Pericardium: There is no evidence of pericardial effusion. Mitral Valve: The mitral valve is normal in structure. No evidence of mitral valve regurgitation. No evidence of mitral valve stenosis. Tricuspid Valve: The tricuspid valve is normal in  structure. Tricuspid valve regurgitation is mild . No evidence of tricuspid stenosis. Aortic Valve: The aortic valve is normal in structure. Aortic valve regurgitation is not visualized. No aortic stenosis is present. Pulmonic Valve: The pulmonic valve was normal in structure. Pulmonic valve regurgitation is not visualized. No evidence of pulmonic stenosis. Aorta: The aortic root is normal in size and structure. Venous: IVC assessment for right atrial pressure unable to be performed due to mechanical ventilation. The inferior vena cava is normal in size with greater than 50% respiratory variability, suggesting right atrial pressure of 3 mmHg. IAS/Shunts: No atrial level shunt detected by color flow Doppler.  LEFT VENTRICLE PLAX 2D LVIDd:         3.80 cm  Diastology LVIDs:         2.30 cm  LV e' medial:    6.31 cm/s LV PW:         0.90 cm  LV E/e' medial:  9.4 LV IVS:        0.90 cm  LV e' lateral:   9.14 cm/s LVOT diam:     2.00 cm  LV E/e' lateral: 6.5 LV SV:         54 LV SV Index:   32 LVOT Area:     3.14 cm  RIGHT VENTRICLE RV S prime:     12.90 cm/s LEFT ATRIUM             Index LA diam:        3.30 cm 1.93 cm/m LA Vol (A2C):   30.6 ml 17.88 ml/m LA Vol (A4C):   47.0 ml 27.47 ml/m LA Biplane Vol: 40.8 ml 23.84 ml/m  AORTIC VALVE LVOT Vmax:   95.80 cm/s LVOT Vmean:  55.500 cm/s LVOT VTI:    0.172 m  AORTA Ao Root diam: 2.90 cm MITRAL VALVE               TRICUSPID VALVE MV Area (PHT): 5.54 cm    TR Peak grad:   32.3 mmHg MV Decel Time: 137 msec    TR Vmax:        284.00 cm/s MV E velocity: 59.60 cm/s MV A velocity: 87.00 cm/s  SHUNTS MV E/A ratio:  0.69        Systemic VTI:  0.17 m                            Systemic Diam: 2.00 cm Dani Gobble Croitoru MD Electronically signed by Sanda Klein MD Signature Date/Time: 09/01/2020/2:22:50 PM  Final    CT HEAD CODE STROKE WO CONTRAST  Result Date: 09/01/2020 CLINICAL DATA:  Code stroke. Right-sided facial droop and weakness since 10 p.m. EXAM: CT HEAD WITHOUT  CONTRAST TECHNIQUE: Contiguous axial images were obtained from the base of the skull through the vertex without intravenous contrast. COMPARISON:  Brain MRI 08/23/2013 FINDINGS: Brain: Subtle low-density in the left temporal occipital cortex. No other visible infarct. No hemorrhage, hydrocephalus, or collection. Vascular: Atheromatous calcification. Especially on coronal imaging the left P 2 or P3 segment appears dense Skull: Normal. Negative for fracture or focal lesion. Sinuses/Orbits: Negative Other: These results were called by telephone at the time of interpretation on 09/01/2020 at 7:04 am to provider Dr Charna Archer, who verbally acknowledged these results. IMPRESSION: 1. Small acute infarct in the left occipital parietal cortex. The adjacent PCA is hyperdense on reformats, suggesting acute thrombosis. The visible infarct is smaller than would be expected for the level of vessel hyperdensity, consider CTA/perfusion if a treatment candidate. 2. No intracranial hemorrhage. Electronically Signed   By: Monte Fantasia M.D.   On: 09/01/2020 07:08     Assessment/Plan: Diagnosis: Left PCA stroke involving occipital and temporal lobes and thalamus 1. Does the need for close, 24 hr/day medical supervision in concert with the patient's rehab needs make it unreasonable for this patient to be served in a less intensive setting? Yes 2. Co-Morbidities requiring supervision/potential complications: 1. Diffuse weakness: will require intensive PT and OT 2. GERD 3. HTN: monitor BP TID 4. HLD 5. Prediabetes: monitor CBGs 3. Due to bladder management, bowel management, safety, skin/wound care, disease management, medication administration, pain management and patient education, does the patient require 24 hr/day rehab nursing? Yes 4. Does the patient require coordinated care of a physician, rehab nurse, therapy disciplines of PT, OT to address physical and functional deficits in the context of the above medical  diagnosis(es)? Yes Addressing deficits in the following areas: balance, endurance, locomotion, strength, transferring, bowel/bladder control, bathing, dressing, feeding, grooming, toileting and psychosocial support 5. Can the patient actively participate in an intensive therapy program of at least 3 hrs of therapy per day at least 5 days per week? Yes 6. The potential for patient to make measurable gains while on inpatient rehab is excellent 7. Anticipated functional outcomes upon discharge from inpatient rehab are modified independent  with PT, modified independent with OT, independent with SLP. 8. Estimated rehab length of stay to reach the above functional goals is: 10-14 days 9. Anticipated discharge destination: Home 10. Overall Rehab/Functional Prognosis: excellent  RECOMMENDATIONS: This patient's condition is appropriate for continued rehabilitative care in the following setting: CIR Patient has agreed to participate in recommended program. Yes Note that insurance prior authorization may be required for reimbursement for recommended care.  Comment: Thank you for this consult. Admission coordinator to follow.   I have personally performed a face to face diagnostic evaluation, including, but not limited to relevant history and physical exam findings, of this patient and developed relevant assessment and plan.  Additionally, I have reviewed and concur with the physician assistant's documentation above.  Leeroy Cha, MD  Bary Leriche, PA-C 09/02/2020

## 2020-09-02 NOTE — Progress Notes (Signed)
NAME:  Deanna Williams, MRN:  [id number], DOB:  23-Nov-1928, LOS: 1 ADMISSION DATE:  09/01/2020, CONSULTATION DATE:  09/01/2020 REFERRING MD:  Estanislado Pandy, CHIEF COMPLAINT:  L PCA occlusion, s/p stent/ aggrastat infusion   History of Present Illness:  87 y.o. female past history of hypertension, hyperlipidemia, presented to the emergency room at [facility]: [facility] Rhonda W 09/01/2020 with right-sided numbness and weakness.  Found to have acute left PCA stroke/. She was transferred to [facility] 4/19 and underwent cerebral angiogram for occlusion of left posterior cerebral artery/ IR thrombectomy. S/P RT VA angiogram followed bt revascularization of occluded P1 Lt PCA with x 1 pass with 85mm x 40 mm solitaire X retriever and aspiration achieving a TICI 3 revascularization followed by placement of a rescue stent for re occlusion due to underlying ICAD achieving a TICI 5 recvascularization .  Slow flow in the stent requiring bolus of aggrastat and IV infusion for 4 hrs. Additionally she was given ASA 81 mg  and   brilinta 180 mg via OG tube.  Pertinent  Medical History  hypertension, hyperlipidemia, GERD  Significant Hospital Events: Including procedures, antibiotic start and stop dates in addition to other pertinent events   .  LKW: 10 PM on 08/31/2020 tpa given?: no, outside the window-determination has been made by telemedicine neurology already Premorbid modified Rankin scale (mRS): 0 Interim History / Subjective:  Patient tolerated spontaneous breathing trial, successfully extubated Currently requiring 2 L oxygen via nasal cannula  Objective   Blood pressure (!) 117/59, pulse 79, temperature 98.2 F (36.8 C), temperature source Oral, resp. rate 19, SpO2 100 %.    Vent Mode: CPAP;PSV FiO2 (%):  [30 %-100 %] 30 % Set Rate:  [12 bmp-16 bmp] 16 bmp Vt Set:  [420 mL] 420 mL PEEP:  [5 cmH20] 5 cmH20 Pressure Support:  [8 cmH20] 8 cmH20 Plateau Pressure:  [12 cmH20] 12 cmH20   Intake/Output Summary (Last 24 hours) at  09/02/2020 0901 Last data filed at 09/02/2020 0600 Gross per 24 hour  Intake 2292.92 ml  Output 1320 ml  Net 972.92 ml   There were no vitals filed for this visit.  Examination: General: Elderly Caucasian female, lying in bed  HENT: NCAT, moist mucous membranes, no JVD Lungs: Clear to auscultation bilaterally, no wheezes or rhonchi Cardiovascular: S1, S2, RRR, No RMG Abdomen: Soft, NT, ND, BS + Extremities: No obvious deformities, warm to touch with brisk capillary refill, pulses palpable or dopplerable Neuro: Alert, awake, oriented, has right upper extremity and right lower extremity drift otherwise stable Skin: No rash  Labs/imaging that I havepersonally reviewed  (right click and "Reselect all SmartList Selections" daily)  4/19 CT Cerebral Perfusion w Contrast Left PCA penumbra which is smaller than the reported 42 mL mismatch volume as that also includes bilateral cerebellar tissue  4/19 CT Head w/o contrast No substantial change in small acute infarct in the left occipital parietal cortex. No progressive mass effect Subtle/faint cortical versus extra-axial hyperdensity in the posteroinferior right temporal and occipital lobes, which could represent contrast staining given recent catheter arteriogram versus trace hemorrhage. No mass occupying acute hemorrhage. Recommend attention on close follow-up. 09/01/2020 CT Head and Neck w and w/o Contrast Confirmed left PCA occlusion at the left P2 segment with downstream reconstitution followed by a severe stenosis at the left PCA bifurcation. 2. Intracranial atherosclerosis with notable right A2, left M2, and left V4 segment stenoses. 3. Atherosclerosis in the neck without flow limiting stenosis. 4. Left brachiocephalic vein stenosis.   CMP Latest Ref  Rng & Units 09/02/2020 09/02/2020 09/01/2020  Glucose 70 - 99 mg/dL - 116(H) -  BUN 8 - 23 mg/dL - 9 -  Creatinine 0.44 - 1.00 mg/dL - 0.65 -  Sodium 135 - 145 mmol/L 139 137 138   Potassium 3.5 - 5.1 mmol/L 3.5 3.6 3.6  Chloride 98 - 111 mmol/L - 108 -  CO2 22 - 32 mmol/L - 21(L) -  Calcium 8.9 - 10.3 mg/dL - 8.5(L) -  Total Protein 6.5 - 8.1 g/dL - - -  Total Bilirubin 0.3 - 1.2 mg/dL - - -  Alkaline Phos 38 - 126 U/L - - -  AST 15 - 41 U/L - - -  ALT 0 - 44 U/L - - -   CBC    Component Value Date/Time   WBC 10.3 09/02/2020 0435   RBC 4.02 09/02/2020 0435   HGB 11.9 (L) 09/02/2020 0436   HGB 14.4 01/21/2020 1041   HCT 35.0 (L) 09/02/2020 0436   HCT 41.3 01/21/2020 1041   PLT 240 09/02/2020 0435   PLT 233 01/21/2020 1041   MCV 95.5 09/02/2020 0435   MCV 93 01/21/2020 1041   MCH 31.1 09/02/2020 0435   MCHC 32.6 09/02/2020 0435   RDW 12.5 09/02/2020 0435   RDW 12.2 01/21/2020 1041   LYMPHSABS 1.4 09/01/2020 0654   LYMPHSABS 1.6 01/21/2020 1041   MONOABS 0.4 09/01/2020 0654   EOSABS 0.1 09/01/2020 0654   EOSABS 0.1 01/21/2020 1041   BASOSABS 0.0 09/01/2020 0654   BASOSABS 0.0 01/21/2020 1041    Resolved Hospital Problem list    Respiratory Insufficieny in post-intervention  Assessment & Plan:  L PCA occlusion, s/p thrombectomy and and followed by stent/ aggrastat infusion Continue aspirin and Brilinta Continue secondary stroke prophylaxis Now SBP goal is less than 180 Titrate Cleviprex gtt  Continue neuro check   PCCM will sign off, please call with question Best practice (right click and "Reselect all SmartList Selections" daily)  Diet: Heart healthy Pain/Anxiety/Delirium protocol (if indicated): No VAP protocol (if indicated): N/A DVT prophylaxis: Subcu Lovenox GI prophylaxis: N/A Glucose control:  SSI Yes Central venous access:  N/A Arterial line: Yes, discontinue A-line Foley:  N/A Mobility: As tolerated PT consulted: Yes Last date of multidisciplinary goals of care discussion [Per primary team] Code Status:  full code Disposition: Per primary team  Labs   CBC: Recent Labs  Lab 09/01/20 0654 09/01/20 1511 09/02/20 0435  09/02/20 0436  WBC 5.7  --  10.3  --   NEUTROABS 3.9  --   --   --   HGB 14.1 14.3 12.5 11.9*  HCT 42.2 42.0 38.4 35.0*  MCV 93.8  --  95.5  --   PLT 256  --  240  --     Basic Metabolic Panel: Recent Labs  Lab 09/01/20 0654 09/01/20 1330 09/01/20 1511 09/02/20 0435 09/02/20 0436  NA 137  --  138 137 139  K 4.7  --  3.6 3.6 3.5  CL 107  --   --  108  --   CO2 23  --   --  21*  --   GLUCOSE 122*  --   --  116*  --   BUN 12  --   --  9  --   CREATININE 0.72  --   --  0.65  --   CALCIUM 9.0  --   --  8.5*  --   MG  --  1.9  --  1.9  --  PHOS  --   --   --  2.6  --    GFR: Estimated Creatinine Clearance: 44.1 mL/min (by C-G formula based on SCr of 0.65 mg/dL). Recent Labs  Lab 09/01/20 0654 09/02/20 0435  WBC 5.7 10.3    Liver Function Tests: Recent Labs  Lab 09/01/20 0654  AST 24  ALT 12  ALKPHOS 75  BILITOT 1.2  PROT 6.4*  ALBUMIN 3.7   No results for input(s): LIPASE, AMYLASE in the last 168 hours. No results for input(s): AMMONIA in the last 168 hours.  ABG    Component Value Date/Time   PHART 7.392 09/02/2020 0436   PCO2ART 36.3 09/02/2020 0436   PO2ART 77 (L) 09/02/2020 0436   HCO3 22.1 09/02/2020 0436   TCO2 23 09/02/2020 0436   ACIDBASEDEF 2.0 09/02/2020 0436   O2SAT 95.0 09/02/2020 0436     Coagulation Profile: Recent Labs  Lab 09/01/20 0654  INR 1.0    Cardiac Enzymes: No results for input(s): CKTOTAL, CKMB, CKMBINDEX, TROPONINI in the last 168 hours.  HbA1C: Hemoglobin A1C  Date/Time Value Ref Range Status  07/20/2020 08:32 AM 5.9 (A) 4.0 - 5.6 % Final   Hgb A1c MFr Bld  Date/Time Value Ref Range Status  09/02/2020 04:35 AM 5.9 (H) 4.8 - 5.6 % Final    Comment:    (NOTE) Pre diabetes:          5.7%-6.4%  Diabetes:              >6.4%  Glycemic control for   <7.0% adults with diabetes   01/21/2020 10:41 AM 6.3 (H) 4.8 - 5.6 % Final    Comment:             Prediabetes: 5.7 - 6.4          Diabetes: >6.4           Glycemic control for adults with diabetes: <7.0     CBG: Recent Labs  Lab 09/01/20 0648 09/02/20 0726  GLUCAP 132* 107*     Jacky Kindle MD [facility] See Amion for pager If no response to pager, please call [phone] until 7pm After 7pm, Please call E-link [phone]

## 2020-09-02 NOTE — Progress Notes (Signed)
Rehab Admissions Coordinator Note:  Patient was screened by Cleatrice Burke for appropriateness for an Inpatient Acute Rehab Consult per therapy recommendations.  At this time, we are recommending Inpatient Rehab consult. I will place order per protocol.  Cleatrice Burke RN MSN 09/02/2020, 2:54 PM  I can be reached at [phone].

## 2020-09-02 NOTE — Plan of Care (Signed)
  Problem: Education: Goal: Knowledge of disease or condition will improve Outcome: Progressing Goal: Knowledge of secondary prevention will improve Outcome: Progressing Goal: Knowledge of patient specific risk factors addressed and post discharge goals established will improve Outcome: Progressing Goal: Individualized Educational Video(s) Outcome: Progressing   Problem: Coping: Goal: Will verbalize positive feelings about self Outcome: Progressing Goal: Will identify appropriate support needs Outcome: Progressing   Problem: Health Behavior/Discharge Planning: Goal: Ability to manage health-related needs will improve Outcome: Progressing   Problem: Self-Care: Goal: Ability to participate in self-care as condition permits will improve Outcome: Progressing Goal: Verbalization of feelings and concerns over difficulty with self-care will improve Outcome: Progressing Goal: Ability to communicate needs accurately will improve Outcome: Progressing   Problem: Nutrition: Goal: Risk of aspiration will decrease Outcome: Progressing Goal: Dietary intake will improve Outcome: Progressing   Problem: Ischemic Stroke/TIA Tissue Perfusion: Goal: Complications of ischemic stroke/TIA will be minimized Outcome: Progressing   Problem: Spontaneous Subarachnoid Hemorrhage Tissue Perfusion: Goal: Complications of Spontaneous Subarachnoid Hemorrhage will be minimized Outcome: Progressing   Problem: Education: Goal: Knowledge of General Education information will improve Description: Including pain rating scale, medication(s)/side effects and non-pharmacologic comfort measures Outcome: Progressing   Problem: Health Behavior/Discharge Planning: Goal: Ability to manage health-related needs will improve Outcome: Progressing   Problem: Clinical Measurements: Goal: Ability to maintain clinical measurements within normal limits will improve Outcome: Progressing Goal: Will remain free from  infection Outcome: Progressing Goal: Diagnostic test results will improve Outcome: Progressing Goal: Respiratory complications will improve Outcome: Progressing Goal: Cardiovascular complication will be avoided Outcome: Progressing   Problem: Activity: Goal: Risk for activity intolerance will decrease Outcome: Progressing   Problem: Nutrition: Goal: Adequate nutrition will be maintained Outcome: Progressing   Problem: Coping: Goal: Level of anxiety will decrease Outcome: Progressing   Problem: Elimination: Goal: Will not experience complications related to bowel motility Outcome: Progressing Goal: Will not experience complications related to urinary retention Outcome: Progressing   Problem: Pain Managment: Goal: General experience of comfort will improve Outcome: Progressing   Problem: Safety: Goal: Ability to remain free from injury will improve Outcome: Progressing   Problem: Skin Integrity: Goal: Risk for impaired skin integrity will decrease Outcome: Progressing

## 2020-09-02 NOTE — Evaluation (Signed)
Speech Language Pathology Evaluation Patient Details Name: Deanna Williams MRN: [id number] DOB: October 25, 1928 Today's Date: 09/02/2020 Time: 4332-9518 SLP Time Calculation (min) (ACUTE ONLY): 24 min  Problem List:  Patient Active Problem List   Diagnosis Date Noted  . Acute ischemic left PCA stroke ([place]) 09/01/2020  . Stroke (cerebrum) ([place]) 09/01/2020  . Acute cerebrovascular accident (CVA) due to occlusion of right posterior cerebral artery ([place]) 09/01/2020  . Stroke ([place]) 09/01/2020  . Hyperglycemia 08/31/2016  . Endometrial polyp 11/11/2015  . Lichen sclerosus 84/16/6063  . Osteoporosis 08/11/2015  . Allergic rhinitis 04/14/2015  . Blood pressure elevated 04/14/2015  . 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 Acid reflux 04/14/2015  . 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 Hypercholesterolemia 04/14/2015  . BP (high blood pressure) 04/14/2015  . Arthritis, degenerative 04/14/2015  . Osteopenia 04/14/2015  . Awareness of heartbeats 04/14/2015   Past Medical History:  Past Medical History:  Diagnosis Date  . Gastroesophageal reflux disease   . Hyperlipidemia   . Hypertension   . Lichen sclerosus   . Lichen sclerosus et atrophicus   . Osteopenia   . Palpitations    Past Surgical History:  Past Surgical History:  Procedure Laterality Date  . CARPAL TUNNEL RELEASE    . FOOT SURGERY    . HYSTEROSCOPY WITH D & C N/A 11/02/2015   Procedure: DILATATION AND CURETTAGE /HYSTEROSCOPY;  Surgeon: Brayton Mars, MD;  Location: [facility];  Service: Gynecology;  Laterality: N/A;  . RADIOLOGY WITH ANESTHESIA N/A 09/01/2020   Procedure: IR WITH ANESTHESIA;  Surgeon: Radiologist, Medication, MD;  Location: [facility];  Service: Radiology;  Laterality: N/A;   HPI:  SHAYLEY MEDLIN is an 87 y.o. Caucasian female with PMH of hypertension, hyperlipidemia, and GERD who presented to [facility] ED with weakness. MRI confirmed Left PCA territory acute ischemia involving the left occipital lobe, medial left temporal  lobe and left thalamus. No acute hemorrhage or mass effect.   Assessment / Plan / Recommendation Clinical Impression  Pt presents with cognitive deficits s/p CVA appearing milder in nature. PLOF pt lives at independent living facility and denies baseline cognitive deficits. She was independent with all ADLS including driving. This date, pt demonstrated deficits in recall including short term memory and of novel information. Complex problem solving, executive function skills, and attention were impaired. Pt oriented x4 though with deficits in specifics of temporal orientation. Motor speech skills and receptive and expressive language appeared intact. Continued ST intervention indicated to maximize cognitive recovery.    SLP Assessment  SLP Recommendation/Assessment: Patient needs continued Speech Lanaguage Pathology Services SLP Visit Diagnosis: Cognitive communication deficit (R41.841)    Follow Up Recommendations  Other (comment) (per PT OT recommendations)    Frequency and Duration min 1 x/week  1 week      SLP Evaluation Cognition  Overall Cognitive Status: Impaired/Different from baseline Arousal/Alertness: Awake/alert Orientation Level: Oriented X4 Attention: Focused;Sustained;Alternating Focused Attention: Appears intact Sustained Attention: Impaired Sustained Attention Impairment: Verbal complex;Functional complex Alternating Attention: Impaired Alternating Attention Impairment: Verbal complex;Functional complex Memory: Impaired Memory Impairment: Decreased recall of new information;Decreased short term memory Decreased Short Term Memory: Verbal complex Awareness: Impaired Problem Solving: Impaired Problem Solving Impairment: Verbal complex;Functional complex Executive Function: Organizing;Self Monitoring Organizing: Impaired Organizing Impairment: Verbal complex;Functional complex Self Monitoring: Impaired Self Monitoring Impairment: Verbal complex;Functional  complex Safety/Judgment: Impaired       Comprehension  Auditory Comprehension Overall Auditory Comprehension: Appears within functional limits for tasks assessed    Expression Expression Primary Mode of Expression: Verbal  Verbal Expression Overall Verbal Expression: Appears within functional limits for tasks assessed Written Expression Dominant Hand: Left   Oral / Motor  Oral Motor/Sensory Function Overall Oral Motor/Sensory Function: Within functional limits Motor Speech Overall Motor Speech: Appears within functional limits for tasks assessed   GO                    Deanna Rasmussen MA, CCC-SLP Acute Rehabilitation Services   09/02/2020, 12:59 PM

## 2020-09-02 NOTE — Progress Notes (Addendum)
STROKE TEAM PROGRESS NOTE   INTERVAL HISTORY Patient is resting well this AM. Patient has been extubated successfully and is not on cleviprex.  MRI confirmed Left PCA territory acute ischemia involving the left occipital lobe, medial left temporal lobe and left thalamus. No acute hemorrhage or mass effect.  Vitals:   09/02/20 0900 09/02/20 1000 09/02/20 1035 09/02/20 1100  BP:  125/73 133/69 (!) 145/72  Pulse: 78 78  83  Resp: 17 14  19   Temp:      TempSrc:      SpO2: 93% 99%  97%   CBC:  Recent Labs  Lab 09/01/20 0654 09/01/20 1511 09/02/20 0435 09/02/20 0436  WBC 5.7  --  10.3  --   NEUTROABS 3.9  --   --   --   HGB 14.1   < > 12.5 11.9*  HCT 42.2   < > 38.4 35.0*  MCV 93.8  --  95.5  --   PLT 256  --  240  --    < > = values in this interval not displayed.   Basic Metabolic Panel:  Recent Labs  Lab 09/01/20 0654 09/01/20 1330 09/01/20 1511 09/02/20 0435 09/02/20 0436  NA 137  --    < > 137 139  K 4.7  --    < > 3.6 3.5  CL 107  --   --  108  --   CO2 23  --   --  21*  --   GLUCOSE 122*  --   --  116*  --   BUN 12  --   --  9  --   CREATININE 0.72  --   --  0.65  --   CALCIUM 9.0  --   --  8.5*  --   MG  --  1.9  --  1.9  --   PHOS  --   --   --  2.6  --    < > = values in this interval not displayed.   Lipid Panel:  Recent Labs  Lab 09/02/20 0441  CHOL 188  TRIG 68  HDL 75  CHOLHDL 2.5  VLDL 14  LDLCALC 99   HgbA1c:  Recent Labs  Lab 09/02/20 0435  HGBA1C 5.9*   Urine Drug Screen:  Recent Labs  Lab 09/01/20 0654  LABOPIA NONE DETECTED  COCAINSCRNUR NONE DETECTED  LABBENZ NONE DETECTED  AMPHETMU NONE DETECTED  THCU NONE DETECTED  LABBARB NONE DETECTED    Alcohol Level No results for input(s): ETH in the last 168 hours.  IMAGING past 24 hours CT HEAD WO CONTRAST  Result Date: 09/01/2020 CLINICAL DATA:  Stroke follow-up. EXAM: CT HEAD WITHOUT CONTRAST TECHNIQUE: Contiguous axial images were obtained from the base of the skull through  the vertex without intravenous contrast. COMPARISON:  Same day CT head. FINDINGS: Brain: Similar subtle hypodensity in the left temporal occipital cortex. Areas of subtle cortical hyperdensity described on the recent prior have resolved and likely represented contrast staining recent catheter arteriogram. No evidence of new acute large vascular territory infarct. No acute hemorrhage. No progressive mass effect. No midline shift. Basal cisterns are patent. No hydrocephalus. No mass lesion. No visible extra-axial fluid collections. Vascular: Left PCA stent. Vasculature better characterized on same day catheter arteriogram. Skull: No acute fracture. Sinuses/Orbits: No acute abnormality. Other: Small left mastoid effusion. IMPRESSION: 1. Similar suspected small acute infarct in the left PCA territory. No progressive mass effect. MRI could further characterize if clinically indicated.  2. Areas of subtle cortical hyperdensity described on the recent prior have resolved and likely represented contrast staining from recent catheter arteriogram. No evidence of acute hemorrhage. Electronically Signed   By: Margaretha Sheffield MD   On: 09/01/2020 16:16   CT HEAD WO CONTRAST  Result Date: 09/01/2020 CLINICAL DATA:  Stroke follow-up post intervention. EXAM: CT HEAD WITHOUT CONTRAST TECHNIQUE: Contiguous axial images were obtained from the base of the skull through the vertex without intravenous contrast. COMPARISON:  Same day CT exams. FINDINGS: Brain: Similar subtle loaded in the left temporal occipital cortex. No evidence of mass occupying acute hemorrhage. Subtle/faint cortical versus extra-axial hyperdensity in the posteroinferior right temporal and occipital lobes (for example see series 6, image 29 and image 19). Evaluation for small volume subarachnoid hemorrhage is somewhat limited due to residual contrast within the vasculature. No hydrocephalus. No midline shift. Basal cisterns are patent. No mass lesion. Vascular:  Residual contrast is noted within the vasculature from same day contrasted studies. Vasculature is better evaluated on same day catheter arteriogram. Skull: No acute fracture. Sinuses/Orbits: No acute findings. Other: Mastoid effusions. IMPRESSION: 1. No substantial change in small acute infarct in the left occipital parietal cortex. No progressive mass effect 2. Subtle/faint cortical versus extra-axial hyperdensity in the posteroinferior right temporal and occipital lobes, which could represent contrast staining given recent catheter arteriogram versus trace hemorrhage. No mass occupying acute hemorrhage. Recommend attention on close follow-up. Electronically Signed   By: Margaretha Sheffield MD   On: 09/01/2020 12:49   MR ANGIO HEAD WO CONTRAST  Result Date: 09/02/2020 CLINICAL DATA:  Stroke follow-up status post thrombectomy. EXAM: MRI HEAD WITHOUT CONTRAST MRA HEAD WITHOUT CONTRAST TECHNIQUE: Multiplanar, multiecho pulse sequences of the brain and surrounding structures were obtained without intravenous contrast. Angiographic images of the head were obtained using MRA technique without contrast. COMPARISON:  Head CT 09/01/2020 FINDINGS: MRI HEAD FINDINGS Brain: Left PCA territory acute ischemia involving the left occipital lobe, medial left temporal lobe and left thalamus. There is also punctate focus of abnormal diffusion restriction at the base of the right precentral gyrus. Focus of chronic microhemorrhage in the brainstem. No acute hemorrhage. There is multifocal hyperintense T2-weighted signal within the white matter. Parenchymal volume and CSF spaces are normal. The midline structures are normal. Vascular: Major flow voids are preserved. Skull and upper cervical spine: Normal calvarium and skull base. Visualized upper cervical spine and soft tissues are normal. Sinuses/Orbits:No paranasal sinus fluid levels or advanced mucosal thickening. No mastoid or middle ear effusion. Normal orbits. MRA HEAD FINDINGS  POSTERIOR CIRCULATION: --Vertebral arteries: Normal --Inferior cerebellar arteries: Normal. --Basilar artery: Normal. --Superior cerebellar arteries: Normal. --Posterior cerebral arteries: There is now a stent in the left PCA P1 and P2 segments. There is normal distal flow related enhancement. Right PCA is normal. ANTERIOR CIRCULATION: --Intracranial internal carotid arteries: Normal. --Anterior cerebral arteries (ACA): Normal. --Middle cerebral arteries (MCA): Severe stenosis of the proximal M2 segment inferior division of the left MCA. No stenosis of the right MCA. ANATOMIC VARIANTS: None IMPRESSION: 1. Left PCA territory acute ischemia involving the left occipital lobe, medial left temporal lobe and left thalamus. No acute hemorrhage or mass effect. 2. Punctate focus of acute ischemia at the base of the right precentral gyrus. 3. Severe stenosis of the proximal M2 segment inferior division of the left MCA. 4. Status post stent placement of left PCA P1 and P2 segments with normal distal flow related enhancement. Electronically Signed   By: Cletus Gash.D.  On: 09/02/2020 03:38   MR BRAIN WO CONTRAST  Result Date: 09/02/2020 CLINICAL DATA:  Stroke follow-up status post thrombectomy. EXAM: MRI HEAD WITHOUT CONTRAST MRA HEAD WITHOUT CONTRAST TECHNIQUE: Multiplanar, multiecho pulse sequences of the brain and surrounding structures were obtained without intravenous contrast. Angiographic images of the head were obtained using MRA technique without contrast. COMPARISON:  Head CT 09/01/2020 FINDINGS: MRI HEAD FINDINGS Brain: Left PCA territory acute ischemia involving the left occipital lobe, medial left temporal lobe and left thalamus. There is also punctate focus of abnormal diffusion restriction at the base of the right precentral gyrus. Focus of chronic microhemorrhage in the brainstem. No acute hemorrhage. There is multifocal hyperintense T2-weighted signal within the white matter. Parenchymal volume and CSF  spaces are normal. The midline structures are normal. Vascular: Major flow voids are preserved. Skull and upper cervical spine: Normal calvarium and skull base. Visualized upper cervical spine and soft tissues are normal. Sinuses/Orbits:No paranasal sinus fluid levels or advanced mucosal thickening. No mastoid or middle ear effusion. Normal orbits. MRA HEAD FINDINGS POSTERIOR CIRCULATION: --Vertebral arteries: Normal --Inferior cerebellar arteries: Normal. --Basilar artery: Normal. --Superior cerebellar arteries: Normal. --Posterior cerebral arteries: There is now a stent in the left PCA P1 and P2 segments. There is normal distal flow related enhancement. Right PCA is normal. ANTERIOR CIRCULATION: --Intracranial internal carotid arteries: Normal. --Anterior cerebral arteries (ACA): Normal. --Middle cerebral arteries (MCA): Severe stenosis of the proximal M2 segment inferior division of the left MCA. No stenosis of the right MCA. ANATOMIC VARIANTS: None IMPRESSION: 1. Left PCA territory acute ischemia involving the left occipital lobe, medial left temporal lobe and left thalamus. No acute hemorrhage or mass effect. 2. Punctate focus of acute ischemia at the base of the right precentral gyrus. 3. Severe stenosis of the proximal M2 segment inferior division of the left MCA. 4. Status post stent placement of left PCA P1 and P2 segments with normal distal flow related enhancement. Electronically Signed   By: Ulyses Jarred M.D.   On: 09/02/2020 03:38   DG CHEST PORT 1 VIEW  Result Date: 09/02/2020 CLINICAL DATA:  Stroke. EXAM: PORTABLE CHEST 1 VIEW COMPARISON:  09/01/2020.  07/18/2018 FINDINGS: Interim extubation and removal of NG tube. Stable cardiomegaly. No pulmonary venous congestion. No focal infiltrate. Questionable density right upper lung possibly a small area of atelectasis. Follow-up PA and lateral chest x-ray suggested pulmonary lesion. Tiny calcified pulmonary nodule in the right again noted consistent  with calcified granuloma. Prominent hiatal hernia again noted. Degenerative changes scoliosis thoracic spine. IMPRESSION: 1.  Interim extubation and removal of NG tube. 2.  Stable cardiomegaly. 3. Questionable density right upper lung, possibly a small area of atelectasis. Follow-up PA lateral chest x-ray suggested to exclude a focal pulmonary lesion. 4.  Prominent hiatal hernia again noted. Electronically Signed   By: Marcello Moores  Register   On: 09/02/2020 06:29   DG Chest Port 1 View  Result Date: 09/01/2020 CLINICAL DATA:  Endotracheal tube placement. EXAM: PORTABLE CHEST 1 VIEW COMPARISON:  July 18, 2018. FINDINGS: The heart size and mediastinal contours are within normal limits. Endotracheal tube is in grossly good position. Nasogastric tube is looped within the hiatal hernia. No pneumothorax is noted. Left lung is clear. Mild right basilar subsegmental atelectasis is noted. The visualized skeletal structures are unremarkable. IMPRESSION: Endotracheal tube in grossly good position. Nasogastric tube tip is seen within the hiatal hernia. Mild right basilar subsegmental atelectasis. Electronically Signed   By: Marijo Conception M.D.   On: 09/01/2020 16:26  ECHOCARDIOGRAM COMPLETE  Result Date: 09/01/2020    ECHOCARDIOGRAM REPORT   Patient Name:   Deanna Williams [facility] Date of Exam: 09/01/2020 Medical Rec #:  [id number]       Height:       63.0 in Accession #:    [id number]      Weight:       150.0 lb Date of Birth:  1929/04/18       BSA:          65.711 m Patient Age:    3 years        BP:           133/60 mmHg Patient Gender: F               HR:           86 bpm. Exam Location:  Inpatient Procedure: 2D Echo, Color Doppler and Cardiac Doppler Indications:    Stroke I63.9  History:        Patient has no prior history of Echocardiogram examinations.                 Risk Factors:Hypertension and Dyslipidemia.  Sonographer:    Darlina Sicilian RDCS Referring Phys: [id number] [facility]  Sonographer Comments: Suboptimal  parasternal window, suboptimal apical window and echo performed with patient supine and on artificial respirator. IMPRESSIONS  1. Left ventricular ejection fraction, by estimation, is 60 to 65%. The left ventricle has normal function. The left ventricle has no regional wall motion abnormalities. Left ventricular diastolic parameters are consistent with Grade I diastolic dysfunction (impaired relaxation).  2. Right ventricular systolic function is normal. The right ventricular size is normal. There is mildly elevated pulmonary artery systolic pressure.  3. The mitral valve is normal in structure. No evidence of mitral valve regurgitation. No evidence of mitral stenosis.  4. The aortic valve is normal in structure. Aortic valve regurgitation is not visualized. No aortic stenosis is present.  5. The inferior vena cava is normal in size with greater than 50% respiratory variability, suggesting right atrial pressure of 3 mmHg. FINDINGS  Left Ventricle: Left ventricular ejection fraction, by estimation, is 60 to 65%. The left ventricle has normal function. The left ventricle has no regional wall motion abnormalities. The left ventricular internal cavity size was normal in size. There is  no left ventricular hypertrophy. Left ventricular diastolic parameters are consistent with Grade I diastolic dysfunction (impaired relaxation). Normal left ventricular filling pressure. Right Ventricle: The right ventricular size is normal. No increase in right ventricular wall thickness. Right ventricular systolic function is normal. There is mildly elevated pulmonary artery systolic pressure. The tricuspid regurgitant velocity is 2.84  m/s, and with an assumed right atrial pressure of 8 mmHg, the estimated right ventricular systolic pressure is 20.2 mmHg. Left Atrium: Left atrial size was normal in size. Right Atrium: Right atrial size was normal in size. Pericardium: There is no evidence of pericardial effusion. Mitral Valve: The  mitral valve is normal in structure. No evidence of mitral valve regurgitation. No evidence of mitral valve stenosis. Tricuspid Valve: The tricuspid valve is normal in structure. Tricuspid valve regurgitation is mild . No evidence of tricuspid stenosis. Aortic Valve: The aortic valve is normal in structure. Aortic valve regurgitation is not visualized. No aortic stenosis is present. Pulmonic Valve: The pulmonic valve was normal in structure. Pulmonic valve regurgitation is not visualized. No evidence of pulmonic stenosis. Aorta: The aortic root is normal in size and structure. Venous: IVC assessment for  right atrial pressure unable to be performed due to mechanical ventilation. The inferior vena cava is normal in size with greater than 50% respiratory variability, suggesting right atrial pressure of 3 mmHg. IAS/Shunts: No atrial level shunt detected by color flow Doppler.  LEFT VENTRICLE PLAX 2D LVIDd:         3.80 cm  Diastology LVIDs:         2.30 cm  LV e' medial:    6.31 cm/s LV PW:         0.90 cm  LV E/e' medial:  9.4 LV IVS:        0.90 cm  LV e' lateral:   9.14 cm/s LVOT diam:     2.00 cm  LV E/e' lateral: 6.5 LV SV:         54 LV SV Index:   32 LVOT Area:     3.14 cm  RIGHT VENTRICLE RV S prime:     12.90 cm/s LEFT ATRIUM             Index LA diam:        3.30 cm 1.93 cm/m LA Vol (A2C):   30.6 ml 17.88 ml/m LA Vol (A4C):   47.0 ml 27.47 ml/m LA Biplane Vol: 40.8 ml 23.84 ml/m  AORTIC VALVE LVOT Vmax:   95.80 cm/s LVOT Vmean:  55.500 cm/s LVOT VTI:    0.172 m  AORTA Ao Root diam: 2.90 cm MITRAL VALVE               TRICUSPID VALVE MV Area (PHT): 5.54 cm    TR Peak grad:   32.3 mmHg MV Decel Time: 137 msec    TR Vmax:        284.00 cm/s MV E velocity: 59.60 cm/s MV A velocity: 87.00 cm/s  SHUNTS MV E/A ratio:  0.69        Systemic VTI:  0.17 m                            Systemic Diam: 2.00 cm Dani Gobble Croitoru MD Electronically signed by Sanda Klein MD Signature Date/Time: 09/01/2020/2:22:50 PM    Final      PHYSICAL EXAM General - Well nourished, well developed, in no apparent distress.  Cardiovascular - Regular rate and rhythm.  Mental Status -  Level of arousal and orientation to time, place, and person were intact. Language including expression, naming, repetition, comprehension was assessed and found intact. Attention span and concentration were normal. Recent and remote memory were intact. Fund of Knowledge was assessed and was intact.  Cranial Nerves II - XII - II - Visual field intact,pupils 31mm PERRL III, IV, VI - Extraocular movements intact. V - Facial sensation intact bilaterally. VII - Facial movement intact bilaterally. VIII - Hearing & vestibular intact bilaterally. X - Palate elevates symmetrically. XI -  shoulder shrug intact bilaterally. XII - Tongue protrusion intact.  Motor Strength - The patient's strength was 5/5 in BUE no drift noted. RLE vertical drift noted, 3/5. LLE at least 4/5 no drift noted.  Motor Tone - Muscle tone was assessed at the neck and appendages and was normal.  Sensory - Light touch intact symmetrically.  Coordination - The patient had normal movements in the hands and feet with no ataxia or dysmetria.  Tremor was absent.  Gait and Station - deferred.   ASSESSMENT/PLAN Deanna Williams is a 87 y.o. female with history of hypertension, hyperlipidemia,  and GERD presenting with weakness. MRI confirmed CVA however etiology remains unknown. Will have loop recorder placed as patient has no known hx of A fib. Patient MRA did not severe stenosis of the proximal M2 of the inferior L MCA. There was normal distal flow of the L PCA s/p stent placement.   Stroke: Left PCA territory acute ischemia due to left P1 occlusion s/p IR and stenting with TICI3. Etiology unknown, concerning for cardioembolic source.  CT head  IMPRESSION: 1. Small acute infarct in the left occipital parietal cortex. The adjacent PCA is hyperdense on reformats,  suggesting acute thrombosis. The visible infarct is smaller than would be expected for the level of vessel hyperdensity, consider CTA/perfusion if a treatment candidate. 2. No intracranial hemorrhage.   CTA head & neck  IMPRESSION: 1. Confirmed left PCA occlusion at the left P2 segment with downstream reconstitution followed by a severe stenosis at the left PCA bifurcation. 2. Intracranial atherosclerosis with notable right A2, left M2, and left V4 segment stenoses. 3. Atherosclerosis in the neck without flow limiting stenosis. 4. Left brachiocephalic vein stenosis.  CT perfusion: IMPRESSION: Left PCA penumbra which is smaller than the reported 42 mL mismatch volume as that also includes bilateral cerebellar tissue.  MRI  and MRA IMPRESSION: 1. Left PCA territory acute ischemia involving the left occipital lobe, medial left temporal lobe and left thalamus. No acute hemorrhage or mass effect. 2. Punctate focus of acute ischemia at the base of the right precentral gyrus. 3. Severe stenosis of the proximal M2 segment inferior division of the left MCA. 4. Status post stent placement of left PCA P1 and P2 segments with normal distal flow related enhancement.   2D Echo LVEF 60-65%. No evidence of structural abnormalities that could increase risk for CVA.  Recommend Loop recorder placement on the day of discharge  HgbA1c 5.9   SCDs for VTE prophylaxis  LDL 99  No antithrombotic prior to admission, now on aspirin 81 mg daily and Brilinta (ticagrelor) 90 mg bid.   Continue on discharge  Therapy recommendations:   CIR  Disposition:  Pending  Hypertension  Cleviprex off  Permissive hypertension (OK if < 180/105) but gradually normalize in 5-7 days  Long-term BP goal normotensive  Hyperlipidemia   Home meds:  none   LDL 110, goal < 98  Add lipitor 20 given advanced age  Continue statin on discharge  Other Stroke Risk Factors  Advanced age  Hospital  day # 1  Damita Dunnings, MD PGY-1  ATTENDING NOTE: I reviewed above note and agree with the assessment and plan. Pt was seen and examined.   87 year old female with history of hypertension, hyperlipidemia admitted for right-sided numbness and weakness.  CT head showed acute infarct small left PCA territory.  Left PCA hyperdense suggesting LVO.  Patient not a tPA candidate given outside window.  CTA head and neck showed left P1/P2 occlusion.  S/p IR and R PCA stenting with TICI3.  EF 60 to 65%, A1c 5.9, LDL 99.  She was extubated yesterday, overnight no acute event, this morning awake alert, passed a swallow on diet.  She denies any heart palpitation or history of cardiac disease.  On exam, patient awake, alert, eyes open, orientated to age, place, time and people. No aphasia, fluent language, following all simple commands, slight dysarthria. Able to name and repeat. No gaze palsy, tracking bilaterally, visual field full, simultagnosia resolved. PERRL. No facial droop. Tongue midline. LUE and LLE no drift. RUE mild drift and RLE  slight drift. Sensation symmetrical bilaterally, R FTN intact but L FTN mild dysmetria but seems not out of proportion to weakness, gait not tested.   Etiology for patient stroke unclear, concerning cardioembolic source.  Recommend loop recorder on the day of discharge.  Currently on aspirin and Brilinta for left PCA stenting.  Continue DAPT and Lipitor 20 on discharge.  PT/OT recommend CR.  Deanna Hawking, MD PhD Stroke Neurology 09/03/2020 12:15 AM  This patient is critically ill due to left PCA occlusion and left PCA stroke status post thrombectomy and at significant risk of neurological worsening, death form recurrent stroke, hemorrhagic conversion, seizure. This patient's care requires constant monitoring of vital signs, hemodynamics, respiratory and cardiac monitoring, review of multiple databases, neurological assessment, discussion with family, other specialists and  medical decision making of high complexity. I spent 35 minutes of neurocritical care time in the care of this patient.   To contact Stroke Continuity provider, please refer to [email or web address] After hours, contact General Neurology

## 2020-09-02 NOTE — Evaluation (Addendum)
Occupational Therapy Evaluation Patient Details Name: Deanna Williams MRN: [id number] DOB: 01/22/1929 Today's Date: 09/02/2020    History of Present Illness This 87 y.o. female admitted with Rt sided weakness.  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  She underwent placement of Lt PCA stent.  PMH includes: palpatations, osteopenia, Lichen sclerousus et atrophicus, HTN   Clinical Impression   Pt admitted with above. She demonstrates the below listed deficits and will benefit from continued OT to maximize safety and independence with BADLs.  Pt present to OT with impaired balance, generalized weakness, decreased coordination and function of Rt UE, impaired visual/perceptual skills.  She currently requires set up to mod A for ADLs and mod A +2 for functional transfers.  She reports she lives at home in an [place] apartment, and was fully independent PTA.   Recommend CIR  level rehab.       Follow Up Recommendations  CIR    Equipment Recommendations  None recommended by OT    Recommendations for Other Services Rehab consult     Precautions / Restrictions Precautions Precautions: Fall Precaution Comments: heavy Rt lateral lean      Mobility Bed Mobility Overal bed mobility: Needs Assistance Bed Mobility: Supine to Sit     Supine to sit: Min assist     General bed mobility comments: assist for balance    Transfers Overall transfer level: Needs assistance Equipment used: 2 person hand held assist;Rolling walker (2 wheeled) Transfers: Sit to/from [facility] Sit to Stand: Mod assist;+2 physical assistance;+2 safety/equipment Stand pivot transfers: Mod assist;+2 physical assistance;+2 safety/equipment       General transfer comment: Pt with heavy Rt lateral lean with Rt knee buckling.  She  requires facilitation for Rt knee and hip extension    Balance Overall balance assessment: Needs assistance Sitting-balance support: Feet supported;Single extremity supported Sitting balance-Leahy Scale: Poor Sitting balance - Comments: requires UE support for static sitting, and mod A for dynamic sitting Postural control: Right lateral lean Standing balance support: Bilateral upper extremity supported Standing balance-Leahy Scale: Poor Standing balance comment: requires assist due to Rt lateral lean and impaired righting reactions                           ADL either performed or assessed with clinical judgement   ADL Overall ADL's : Needs assistance/impaired Eating/Feeding: Modified independent;Sitting;Bed level   Grooming: Wash/dry hands;Wash/dry face;Oral care;Brushing hair;Set up;Supervision/safety;Sitting Grooming Details (indicate cue type and reason): looses balance to the Rt Upper Body Bathing: Minimal assistance;Sitting Upper Body Bathing Details (indicate cue type and reason): for sitting balance Lower Body Bathing: Moderate assistance;Sit to/from stand   Upper Body Dressing : Moderate assistance;Sitting   Lower Body Dressing: Moderate assistance;Sit to/from stand Lower Body Dressing Details (indicate cue type and reason): Pt looses balance to the Rt with poor awareness and with decreased righting reaction.  She requires mod A for sitting balance.  Assist to pull socks over her feet Toilet Transfer: Moderate assistance;+2 for physical assistance;+2 for safety/equipment;Stand-pivot;BSC;RW Toilet Transfer Details (indicate cue type and reason): assist for balance and walker safety Toileting- Clothing Manipulation and Hygiene: Moderate assistance;+2 for physical assistance;+2 for safety/equipment;Sit to/from stand Toileting - Clothing Manipulation Details (indicate cue type and reason): Pt leans heavily to the Rt  Functional mobility during ADLs: Moderate  assistance;+2 for physical assistance;+2 for safety/equipment;Rolling walker       Vision Baseline Vision/History: Wears glasses Wears Glasses: At all times Patient Visual Report: Diplopia;Blurring of vision Vision Assessment?: Yes Eye Alignment: Impaired (comment) Additional Comments: Pt with mild pstosis on the Lt eye and squints eyes when performing pursuits making it difficult to accurately assess eye movements.  She does demonstrate dysconjugate gaze and does endorse diplopia with Rt gaze.  Attempted partial occlusion, but pt with poor tolerance.     Perception Perception Perception Tested?: Yes Comments: will benefit from further assessment   Praxis Praxis Praxis tested?: Within functional limits    Pertinent Vitals/Pain Pain Assessment: No/denies pain     Hand Dominance Left   Extremity/Trunk Assessment Upper Extremity Assessment Upper Extremity Assessment: RUE deficits/detail RUE Deficits / Details: mild dysmetria RUE Coordination: decreased fine motor;decreased gross motor   Lower Extremity Assessment Lower Extremity Assessment: Defer to PT evaluation   Cervical / Trunk Assessment Cervical / Trunk Assessment: Kyphotic   Communication Communication Communication: No difficulties   Cognition Arousal/Alertness: Awake/alert Behavior During Therapy: WFL for tasks assessed/performed Overall Cognitive Status: Impaired/Different from baseline Area of Impairment: Attention;Following commands;Awareness;Problem solving                   Current Attention Level: Selective   Following Commands: Follows one step commands consistently;Follows multi-step commands inconsistently   Awareness: Intellectual Problem Solving: Slow processing;Difficulty sequencing;Requires verbal cues;Requires tactile cues General Comments: Pt with poor awareness of deficits and impact on function   General Comments  Pt's grand daughter present.  Discussed need/recommendation for Post  acute rehab with pt    Exercises     Shoulder Instructions      Home Living Family/patient expects to be discharged to:: Private residence Living Arrangements: Alone Available Help at Discharge: Family Type of Home: Independent living facility Home Access: Level entry     [place]: One level     Bathroom Shower/Tub: Occupational psychologist: Handicapped height     [place]: Grab bars - toilet;Grab bars - tub/shower;Hand held shower head;None   Additional Comments: Pt lives in a retirement community with her yorkie      Prior Functioning/Environment Level of Independence: Independent        Comments: drives, grocery shops, cooks, walks her dog        OT Problem List:        OT Treatment/Interventions: Self-care/ADL training;Neuromuscular education;DME and/or AE instruction;Therapeutic activities;Cognitive remediation/compensation;Patient/family education;Visual/perceptual remediation/compensation;Balance training    OT Goals(Current goals can be found in the care plan section) Acute Rehab OT Goals Patient Stated Goal: To get out of the bed OT Goal Formulation: With patient/family Time For Goal Achievement: 09/16/20 Potential to Achieve Goals: Good ADL Goals Pt Will Perform Grooming: with min guard assist;standing Pt Will Perform Upper Body Bathing: with supervision;with set-up;sitting Pt Will Perform Lower Body Bathing: with min assist;sit to/from stand Pt Will Perform Upper Body Dressing: with set-up;with supervision;sitting Pt Will Perform Lower Body Dressing: with min assist;sit to/from stand Pt Will Transfer to Toilet: with min assist;ambulating;regular height toilet;bedside commode;grab bars Pt Will Perform Toileting - Clothing Manipulation and hygiene: with min assist;sit to/from stand  OT Frequency: Min 2X/week   Barriers to D/C: Decreased caregiver support          Co-evaluation PT/OT/SLP Co-Evaluation/Treatment: Yes Reason for  Co-Treatment: For patient/therapist safety;To address functional/ADL transfers   OT goals addressed during session: ADL's and self-care  AM-PAC OT "6 Clicks" Daily Activity     Outcome Measure Help from another person eating meals?: None Help from another person taking care of personal grooming?: A Little Help from another person toileting, which includes using toliet, bedpan, or urinal?: A Lot Help from another person bathing (including washing, rinsing, drying)?: A Lot Help from another person to put on and taking off regular upper body clothing?: A Lot Help from another person to put on and taking off regular lower body clothing?: A Lot 6 Click Score: 15   End of Session Equipment Utilized During Treatment: Rolling walker;Gait belt Nurse Communication: Mobility status  Activity Tolerance: Patient tolerated treatment well Patient left: in chair;with call bell/phone within reach;with chair alarm set;with family/visitor present  OT Visit Diagnosis: Unsteadiness on feet (R26.81)                Time: 8242-3536 OT Time Calculation (min): 43 min Charges:  OT General Charges $OT Visit: 1 Visit OT Evaluation $OT Eval Moderate Complexity: 1 Mod OT Treatments $Self Care/Home Management : 8-22 mins  Nilsa Nutting., OTR/L Acute Rehabilitation Services Pager [phone] Office [phone]   Lucille Passy M 09/02/2020, 12:48 PM

## 2020-09-02 NOTE — Plan of Care (Incomplete)
Neuro - awake, alert, eyes open, orientated to age, place, time and people. No aphasia, fluent language, following all simple commands, slight dysarthria. Able to name and repeat. No gaze palsy, tracking bilaterally, visual field full, simultagnosia resolved. PERRL. No facial droop. Tongue midline. LUE and LLE no drift. RUE mild drift and RLE slight drift. Sensation symmetrical bilaterally, R FTN intact but L FTN mild dysmetria but seems not out of proportion to weakness, gait not tested.

## 2020-09-02 NOTE — Evaluation (Signed)
Physical Therapy Evaluation Patient Details Name: Deanna Williams MRN: [id number] DOB: [birth year] Today's Date: 09/02/2020   History of Present Illness  This 87 y.o. female admitted 4/19 with Rt sided weakness.  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  She underwent placement of Lt PCA stent.  PMH includes: palpatations, osteopenia, Lichen sclerousus et atrophicus, HTN    Clinical Impression  Pt presents with condition above and deficits mentioned below, see PT Problem List. PTA, she was living at an [employer] and performing all functional mobility independently without an AD. Currently, pt demonstrated deficits in balance, generalized weakness (R side weaker), decreased coordination, decreased activity tolerance, and impaired visual/perceptual skills. She is at high risk for falls and R ankle inversion injuries due to her deficits in strength, coordination, and proprioception. She currently requires minA to sit up EOB but mod A +2 for functional transfers and very short gait bouts with a RW. Due to her drastic change from her PLOF and her high motivation to participate and improve she could greatly benefit from intensive therapy in the CIR setting to maximize her independence and safety with all functional mobility, decrease her risk for falls, and maximize her quality of life. Will continue to follow acutely.     Follow Up Recommendations CIR    Equipment Recommendations  Rolling walker with 5" wheels    Recommendations for Other Services Rehab consult     Precautions / Restrictions Precautions Precautions: Fall Precaution Comments: heavy Rt lateral lean Restrictions Weight Bearing Restrictions: No      Mobility  Bed Mobility Overal bed mobility: Needs Assistance Bed Mobility: Supine to Sit      Supine to sit: Min assist     General bed mobility comments: assist for balance    Transfers Overall transfer level: Needs assistance Equipment used: 2 person hand held assist;Rolling walker (2 wheeled) Transfers: Sit to/from [facility] Sit to Stand: Mod assist;+2 physical assistance;+2 safety/equipment Stand pivot transfers: Mod assist;+2 physical assistance;+2 safety/equipment       General transfer comment: Pt with heavy Rt lateral lean with Rt knee buckling.  She requires facilitation for Rt knee and hip extension. Pt also with R ankle instability, needing intermittent blocking to prevent inversion injury. Cues for hand placement with use of RW.  Ambulation/Gait Ambulation/Gait assistance: Mod assist;+2 physical assistance;+2 safety/equipment Gait Distance (Feet): 3 Feet Assistive device: Rolling walker (2 wheeled) Gait Pattern/deviations: Step-through pattern;Decreased step length - right;Decreased step length - left;Decreased stride length;Decreased weight shift to right;Decreased weight shift to left;Decreased dorsiflexion - right;Trunk flexed (leans R) Gait velocity: reduced Gait velocity interpretation: <1.31 ft/sec, indicative of household ambulator General Gait Details: Pt with heavy Rt lateral lean with Rt knee buckling.  She requires facilitation for Rt knee and hip extension. Pt also with R ankle instability, needing intermittent blocking to prevent inversion injury. Cues for hand and body placement with use of RW. Slow, unsteady steps to L with assistance to manage RW to turn.  Stairs            Wheelchair Mobility    Modified Rankin (Stroke Patients Only) Modified Rankin (Stroke Patients Only) Pre-Morbid Rankin Score: No symptoms Modified Rankin: Moderately severe disability     Balance Overall balance assessment: Needs assistance Sitting-balance support: Feet supported;Single extremity supported Sitting balance-Leahy Scale:  Poor Sitting  balance - Comments: requires UE support for static sitting, and mod A for dynamic sitting Postural control: Right lateral lean Standing balance support: Bilateral upper extremity supported Standing balance-Leahy Scale: Poor Standing balance comment: requires assist due to Rt lateral lean and impaired righting reactions                             Pertinent Vitals/Pain Pain Assessment: No/denies pain    Home Living Family/patient expects to be discharged to:: Private residence Living Arrangements: Alone Available Help at Discharge: Family Type of Home: Independent living facility Home Access: Level entry     Home Layout: One level Home Equipment: Grab bars - toilet;Grab bars - tub/shower;Hand held shower head;None Additional Comments: Pt lives in a retirement community with her yorkie    Prior Function Level of Independence: Independent         Comments: drives, grocery shops, cooks, walks her Dealer Dominance   Dominant Hand: Left    Extremity/Trunk Assessment   Upper Extremity Assessment Upper Extremity Assessment: Defer to OT evaluation    Lower Extremity Assessment Lower Extremity Assessment: RLE deficits/detail RLE Deficits / Details: Decreased strength compared to L noted with functional mobility, primarily in gluts, quads, and peroneals. RLE Sensation: decreased proprioception RLE Coordination: decreased fine motor;decreased gross motor    Cervical / Trunk Assessment Cervical / Trunk Assessment: Kyphotic  Communication   Communication: No difficulties  Cognition Arousal/Alertness: Awake/alert Behavior During Therapy: WFL for tasks assessed/performed Overall Cognitive Status: Impaired/Different from baseline Area of Impairment: Attention;Following commands;Awareness;Problem solving                   Current Attention Level: Selective   Following Commands: Follows one step commands consistently;Follows multi-step  commands inconsistently   Awareness: Intellectual Problem Solving: Slow processing;Difficulty sequencing;Requires verbal cues;Requires tactile cues General Comments: Pt with poor awareness of deficits and impact on function, needing repeated education on deficits and cause.      General Comments General comments (skin integrity, edema, etc.): Pt's grand daughter present.  Discussed need/recommendation for Post acute rehab with pt    Exercises     Assessment/Plan    PT Assessment Patient needs continued PT services  PT Problem List Decreased strength;Decreased activity tolerance;Decreased balance;Decreased mobility;Decreased coordination;Decreased cognition;Decreased knowledge of use of DME;Decreased safety awareness;Impaired sensation       PT Treatment Interventions DME instruction;Gait training;Functional mobility training;Therapeutic activities;Therapeutic exercise;Balance training;Neuromuscular re-education;Cognitive remediation;Patient/family education    PT Goals (Current goals can be found in the Care Plan section)  Acute Rehab PT Goals Patient Stated Goal: To get out of the bed PT Goal Formulation: With patient/family Time For Goal Achievement: 09/16/20 Potential to Achieve Goals: Good    Frequency Min 4X/week   Barriers to discharge        Co-evaluation PT/OT/SLP Co-Evaluation/Treatment: Yes Reason for Co-Treatment: For patient/therapist safety;To address functional/ADL transfers PT goals addressed during session: Mobility/safety with mobility;Balance;Proper use of DME         AM-PAC PT "6 Clicks" Mobility  Outcome Measure Help needed turning from your back to your side while in a flat bed without using bedrails?: A Little Help needed moving from lying on your back to sitting on the side of a flat bed without using bedrails?: A Little Help needed moving to and from a bed to a chair (including a wheelchair)?: A Lot Help needed standing up from a chair using  your arms (e.g.,  wheelchair or bedside chair)?: A Lot Help needed to walk in hospital room?: A Lot Help needed climbing 3-5 steps with a railing? : Total 6 Click Score: 13    End of Session Equipment Utilized During Treatment: Gait belt Activity Tolerance: Patient tolerated treatment well Patient left: in chair;with call bell/phone within reach;with chair alarm set;with family/visitor present Nurse Communication: Mobility status PT Visit Diagnosis: Unsteadiness on feet (R26.81);Other abnormalities of gait and mobility (R26.89);Muscle weakness (generalized) (M62.81);Difficulty in walking, not elsewhere classified (R26.2);Other symptoms and signs involving the nervous system (R29.898);Hemiplegia and hemiparesis Hemiplegia - Right/Left: Right Hemiplegia - dominant/non-dominant: Non-dominant Hemiplegia - caused by: Cerebral infarction    Time: 1128-1211 PT Time Calculation (min) (ACUTE ONLY): 43 min   Charges:   PT Evaluation $PT Eval Moderate Complexity: 1 Mod          Moishe Spice, PT, DPT Acute Rehabilitation Services  Pager: [phone] Office: [phone]   Orvan Falconer 09/02/2020, 7:59 PM

## 2020-09-02 NOTE — Consult Note (Addendum)
ELECTROPHYSIOLOGY CONSULT NOTE  Patient ID: Deanna Williams MRN: [id number], DOB/AGE: Oct 01, 1981   Admit date: 09/01/2020 Date of Consult: 09/04/2020  Primary Physician: Jerrol Banana., MD Primary Cardiologist: No primary care provider on file.  Primary Electrophysiologist: New to Dr. Curt Bears Reason for Consultation: Cryptogenic stroke; recommendations regarding Implantable Loop Recorder Insurance: [employer]  History of Present Illness EP has been asked to evaluate Deanna Williams for placement of an implantable loop recorder to monitor for atrial fibrillation by Dr Erlinda Hong.  The patient was admitted on 09/01/2020 with R sided weakness and numbness.  Imaging demonstrated Left PCA territory acute ischemia due to left P1 occlusion s/p IR and stenting with TICI3. Etiology unknown, concerning for cardioembolic source.  She has undergone workup for stroke including:   CT head IMPRESSION: 1. Small acute infarct in the left occipital parietal cortex. The adjacent PCA is hyperdense on reformats, suggesting acute thrombosis. The visible infarct is smaller than would be expected for the level of vessel hyperdensity, consider CTA/perfusion if a treatment candidate. 2. No intracranial hemorrhage.  CTA head & neck IMPRESSION: 1. Confirmed left PCA occlusion at the left P2 segment with downstream reconstitution followed by a severe stenosis at the left PCA bifurcation. 2. Intracranial atherosclerosis with notable right A2, left M2, and left V4 segment stenoses. 3. Atherosclerosis in the neck without flow limiting stenosis. 4. Left brachiocephalic vein stenosis.  CT perfusion: IMPRESSION: Left PCA penumbra which is smaller than the reported 42 mL mismatch volume as that also includes bilateral cerebellar tissue. MRIand MRA IMPRESSION: 1. Left PCA territory acute ischemia involving the left occipital lobe, medial left temporal lobe and left thalamus. No acute hemorrhage  or mass effect. 2. Punctate focus of acute ischemia at the base of the right precentral gyrus. 3. Severe stenosis of the proximal M2 segment inferior division of the left MCA. 4. Status post stent placement of left PCA P1 and P2 segments with normal distal flow related enhancement.   Echo 09/01/20 LVEF 60-65%. No evidence of structural abnormalities that could increase risk for CVA.  HgbA1c5.9   SCDsfor VTE prophylaxis  LDL 99  DVT US negative   The patient has been monitored on telemetry which has demonstrated sinus rhythm. This am she had a brief period where she was in atrial fibrillation with rates in the 100-130s.  Inpatient stroke work-up Deanna Williams not require a TEE per Neurology.   Echocardiogram as above. Lab work is reviewed.  Prior to admission, the patient denies chest pain, shortness of breath, dizziness, palpitations, or syncope.  She is recovering from her stroke with plans to attend CIR  at discharge.  Past Medical History:  Diagnosis Date  . Gastroesophageal reflux disease   . Hyperlipidemia   . Hypertension   . Lichen sclerosus   . Lichen sclerosus et atrophicus   . Osteopenia   . Palpitations      Surgical History:  Past Surgical History:  Procedure Laterality Date  . CARPAL TUNNEL RELEASE    . FOOT SURGERY    . HYSTEROSCOPY WITH D & C N/A 11/02/2015   Procedure: DILATATION AND CURETTAGE /HYSTEROSCOPY;  Surgeon: Brayton Mars, MD;  Location: [facility];  Service: Gynecology;  Laterality: N/A;  . IR CT HEAD LTD  09/01/2020  . IR INTRA CRAN STENT  09/01/2020  . IR PERCUTANEOUS ART THROMBECTOMY/INFUSION INTRACRANIAL INC DIAG ANGIO  09/01/2020  . RADIOLOGY WITH ANESTHESIA N/A 09/01/2020   Procedure: IR WITH ANESTHESIA;  Surgeon: Radiologist, Medication, MD;  Location: [facility];  Service: Radiology;  Laterality: N/A;     Medications Prior to Admission  Medication Sig Dispense Refill Last Dose  . conjugated estrogens (PREMARIN) vaginal cream Place 1  Applicatorful vaginally daily. (Patient not taking: Reported on 09/01/2020) 42.5 g 12 Not Taking at Unknown time  . omeprazole (PRILOSEC) 20 MG capsule Take 1 capsule (20 mg total) by mouth daily. (Patient not taking: Reported on 09/01/2020) 30 capsule 3 Not Taking at Unknown time  . terconazole (TERAZOL 3) 0.8 % vaginal cream Place 1 applicator vaginally at bedtime. (Patient not taking: No sig reported) 20 g 3 Not Taking at Unknown time    Inpatient Medications:  . amLODipine  10 mg Oral Daily  . aspirin  81 mg Oral Daily   Or  . aspirin  81 mg Per Tube Daily  . atorvastatin  20 mg Oral Daily  . Chlorhexidine Gluconate Cloth  6 each Topical Daily  . enoxaparin (LOVENOX) injection  40 mg Subcutaneous Q24H  . ticagrelor  90 mg Oral BID   Or  . ticagrelor  90 mg Per Tube BID    Allergies: No Known Allergies  Social History   Socioeconomic History  . Marital status: Widowed    Spouse name: Not on file  . Number of children: 1  . Years of education: Not on file  . Highest education level: 11th grade  Occupational History  . Not on file  Tobacco Use  . Smoking status: Never Smoker  . Smokeless tobacco: Never Used  Vaping Use  . Vaping Use: Never used  Substance and Sexual Activity  . Alcohol use: Yes    Alcohol/week: 0.0 standard drinks    Comment: rare- wine  . Drug use: No  . Sexual activity: Never  Other Topics Concern  . Not on file  Social History Narrative  . Not on file   Social Determinants of Health   Financial Resource Strain: Not on file  Food Insecurity: Not on file  Transportation Needs: Not on file  Physical Activity: Not on file  Stress: Not on file  Social Connections: Not on file  Intimate Partner Violence: Not on file     Family History  Problem Relation Age of Onset  . Cancer Mother   . Leukemia Mother   . Cancer Father        colon  . Hypertension Father   . Arthritis Sister   . Breast cancer Neg Hx       Review of Systems: All other  systems reviewed and are otherwise negative except as noted above.  Physical Exam: Vitals:   09/04/20 0003 09/04/20 0316 09/04/20 0500 09/04/20 0823  BP: (!) 142/72 (!) 146/110  (!) 151/64  Pulse: 77 82  76  Resp:      Temp: 98.1 F (36.7 C) 98 F (36.7 C)  98.2 F (36.8 C)  TempSrc: Oral Oral  Oral  SpO2: 99% 98%  97%  Weight:   67.4 kg     GEN- The patient is well appearing, alert and oriented x 3 today.   Head- normocephalic, atraumatic Eyes-  Sclera clear, conjunctiva pink Ears- hearing intact Oropharynx- clear Neck- supple Lungs- Clear to ausculation bilaterally, normal work of breathing Heart- Regular rate and rhythm, no murmurs, rubs or gallops  GI- soft, NT, ND, + BS Extremities- no clubbing, cyanosis, or edema MS- no significant deformity or atrophy Skin- no rash or lesion Psych- euthymic mood, full affect   Labs:   Lab  Results  Component Value Date   WBC 6.7 09/04/2020   HGB 12.7 09/04/2020   HCT 38.5 09/04/2020   MCV 94.8 09/04/2020   PLT 221 09/04/2020    Recent Labs  Lab 09/01/20 0654 09/01/20 1511 09/04/20 0556  NA 137   < > 135  K 4.7   < > 3.9  CL 107   < > 105  CO2 23   < > 22  BUN 12   < > 11  CREATININE 0.72   < > 0.71  CALCIUM 9.0   < > 8.7*  PROT 6.4*  --   --   BILITOT 1.2  --   --   ALKPHOS 75  --   --   ALT 12  --   --   AST 24  --   --   GLUCOSE 122*   < > 119*   < > = values in this interval not displayed.     Radiology/Studies: CT Angio Head W or Wo Contrast  Result Date: 09/01/2020 CLINICAL DATA:  Right-sided facial droop and weakness since 10 p.m. EXAM: CT ANGIOGRAPHY HEAD AND NECK TECHNIQUE: Multidetector CT imaging of the head and neck was performed using the standard protocol during bolus administration of intravenous contrast. Multiplanar CT image reconstructions and MIPs were obtained to evaluate the vascular anatomy. Carotid stenosis measurements (when applicable) are obtained utilizing NASCET criteria, using the  distal internal carotid diameter as the denominator. CONTRAST:  92mL OMNIPAQUE IOHEXOL 350 MG/ML SOLN COMPARISON:  Noncontrast head CT from earlier today FINDINGS: CTA NECK FINDINGS Aortic arch: Atheromatous plaque.  Three vessel branching. Right carotid system: Atheromatous wall thickening diffusely. Calcified plaque at the bifurcation without flow limiting stenosis or ulceration. Left carotid system: Circumferential atheromatous wall thickening. Calcified plaque at the bifurcation without ulceration or flow limiting stenosis. Vertebral arteries: No proximal subclavian flow limiting stenosis. Right dominant vertebral artery. Both vertebral arteries are patent to the dura. There is limitation to visualization of the proximal vertebral lumen on both sides due to artifact from intravenous contrast. Skeleton: Cervical spine degeneration with C3-4 and C4-5 anterolisthesis. Other neck: Extensive intravenous reflux of contrast. There is a stenotic appearance of the left brachiocephalic vein. The bolus dispersion cause a sub optimal opacification of arterial structures. Upper chest: Negative Review of the MIP images confirms the above findings CTA HEAD FINDINGS Anterior circulation: Atheromatous calcification at the carotid siphons. Hypoplastic left A1 segment with mildly larger right ICA. Atheromatous changes to medium vessels. There is a notable high-grade narrowing at the right A2 level. Severe narrowing of the left lower M2 branch seen on reformats. Posterior circulation: Right dominant vertebral artery. Atheromatous plaque at both V4 segments with 50-60% narrowing of the left V4 segment. The basilar artery is smooth and diffusely patent. Left P2 branch occlusion with subsequent reconstitution and then subsequent severe stenosis at the PCA bifurcation. Negative for aneurysm. Venous sinuses: Diffusely patent Anatomic variants: None significant Review of the MIP images confirms the above findings These results were  called by telephone at the time of interpretation on 09/01/2020 at 7:48 am to provider [facility] , who verbally acknowledged these results. IMPRESSION: 1. Confirmed left PCA occlusion at the left P2 segment with downstream reconstitution followed by a severe stenosis at the left PCA bifurcation. 2. Intracranial atherosclerosis with notable right A2, left M2, and left V4 segment stenoses. 3. Atherosclerosis in the neck without flow limiting stenosis. 4. Left brachiocephalic vein stenosis. Electronically Signed   By: Monte Fantasia  M.D.   On: 09/01/2020 07:49   CT HEAD WO CONTRAST  Result Date: 09/01/2020 CLINICAL DATA:  Stroke follow-up. EXAM: CT HEAD WITHOUT CONTRAST TECHNIQUE: Contiguous axial images were obtained from the base of the skull through the vertex without intravenous contrast. COMPARISON:  Same day CT head. FINDINGS: Brain: Similar subtle hypodensity in the left temporal occipital cortex. Areas of subtle cortical hyperdensity described on the recent prior have resolved and likely represented contrast staining recent catheter arteriogram. No evidence of new acute large vascular territory infarct. No acute hemorrhage. No progressive mass effect. No midline shift. Basal cisterns are patent. No hydrocephalus. No mass lesion. No visible extra-axial fluid collections. Vascular: Left PCA stent. Vasculature better characterized on same day catheter arteriogram. Skull: No acute fracture. Sinuses/Orbits: No acute abnormality. Other: Small left mastoid effusion. IMPRESSION: 1. Similar suspected small acute infarct in the left PCA territory. No progressive mass effect. MRI could further characterize if clinically indicated. 2. Areas of subtle cortical hyperdensity described on the recent prior have resolved and likely represented contrast staining from recent catheter arteriogram. No evidence of acute hemorrhage. Electronically Signed   By: Margaretha Sheffield MD   On: 09/01/2020 16:16   CT HEAD WO  CONTRAST  Result Date: 09/01/2020 CLINICAL DATA:  Stroke follow-up post intervention. EXAM: CT HEAD WITHOUT CONTRAST TECHNIQUE: Contiguous axial images were obtained from the base of the skull through the vertex without intravenous contrast. COMPARISON:  Same day CT exams. FINDINGS: Brain: Similar subtle loaded in the left temporal occipital cortex. No evidence of mass occupying acute hemorrhage. Subtle/faint cortical versus extra-axial hyperdensity in the posteroinferior right temporal and occipital lobes (for example see series 6, image 29 and image 19). Evaluation for small volume subarachnoid hemorrhage is somewhat limited due to residual contrast within the vasculature. No hydrocephalus. No midline shift. Basal cisterns are patent. No mass lesion. Vascular: Residual contrast is noted within the vasculature from same day contrasted studies. Vasculature is better evaluated on same day catheter arteriogram. Skull: No acute fracture. Sinuses/Orbits: No acute findings. Other: Mastoid effusions. IMPRESSION: 1. No substantial change in small acute infarct in the left occipital parietal cortex. No progressive mass effect 2. Subtle/faint cortical versus extra-axial hyperdensity in the posteroinferior right temporal and occipital lobes, which could represent contrast staining given recent catheter arteriogram versus trace hemorrhage. No mass occupying acute hemorrhage. Recommend attention on close follow-up. Electronically Signed   By: Margaretha Sheffield MD   On: 09/01/2020 12:49   CT Angio Neck W and/or Wo Contrast  Result Date: 09/01/2020 CLINICAL DATA:  Right-sided facial droop and weakness since 10 p.m. EXAM: CT ANGIOGRAPHY HEAD AND NECK TECHNIQUE: Multidetector CT imaging of the head and neck was performed using the standard protocol during bolus administration of intravenous contrast. Multiplanar CT image reconstructions and MIPs were obtained to evaluate the vascular anatomy. Carotid stenosis measurements  (when applicable) are obtained utilizing NASCET criteria, using the distal internal carotid diameter as the denominator. CONTRAST:  48mL OMNIPAQUE IOHEXOL 350 MG/ML SOLN COMPARISON:  Noncontrast head CT from earlier today FINDINGS: CTA NECK FINDINGS Aortic arch: Atheromatous plaque.  Three vessel branching. Right carotid system: Atheromatous wall thickening diffusely. Calcified plaque at the bifurcation without flow limiting stenosis or ulceration. Left carotid system: Circumferential atheromatous wall thickening. Calcified plaque at the bifurcation without ulceration or flow limiting stenosis. Vertebral arteries: No proximal subclavian flow limiting stenosis. Right dominant vertebral artery. Both vertebral arteries are patent to the dura. There is limitation to visualization of the proximal vertebral lumen on  both sides due to artifact from intravenous contrast. Skeleton: Cervical spine degeneration with C3-4 and C4-5 anterolisthesis. Other neck: Extensive intravenous reflux of contrast. There is a stenotic appearance of the left brachiocephalic vein. The bolus dispersion cause a sub optimal opacification of arterial structures. Upper chest: Negative Review of the MIP images confirms the above findings CTA HEAD FINDINGS Anterior circulation: Atheromatous calcification at the carotid siphons. Hypoplastic left A1 segment with mildly larger right ICA. Atheromatous changes to medium vessels. There is a notable high-grade narrowing at the right A2 level. Severe narrowing of the left lower M2 branch seen on reformats. Posterior circulation: Right dominant vertebral artery. Atheromatous plaque at both V4 segments with 50-60% narrowing of the left V4 segment. The basilar artery is smooth and diffusely patent. Left P2 branch occlusion with subsequent reconstitution and then subsequent severe stenosis at the PCA bifurcation. Negative for aneurysm. Venous sinuses: Diffusely patent Anatomic variants: None significant Review of  the MIP images confirms the above findings These results were called by telephone at the time of interpretation on 09/01/2020 at 7:48 am to provider [facility] , who verbally acknowledged these results. IMPRESSION: 1. Confirmed left PCA occlusion at the left P2 segment with downstream reconstitution followed by a severe stenosis at the left PCA bifurcation. 2. Intracranial atherosclerosis with notable right A2, left M2, and left V4 segment stenoses. 3. Atherosclerosis in the neck without flow limiting stenosis. 4. Left brachiocephalic vein stenosis. Electronically Signed   By: Monte Fantasia M.D.   On: 09/01/2020 07:49   MR ANGIO HEAD WO CONTRAST  Result Date: 09/02/2020 CLINICAL DATA:  Stroke follow-up status post thrombectomy. EXAM: MRI HEAD WITHOUT CONTRAST MRA HEAD WITHOUT CONTRAST TECHNIQUE: Multiplanar, multiecho pulse sequences of the brain and surrounding structures were obtained without intravenous contrast. Angiographic images of the head were obtained using MRA technique without contrast. COMPARISON:  Head CT 09/01/2020 FINDINGS: MRI HEAD FINDINGS Brain: Left PCA territory acute ischemia involving the left occipital lobe, medial left temporal lobe and left thalamus. There is also punctate focus of abnormal diffusion restriction at the base of the right precentral gyrus. Focus of chronic microhemorrhage in the brainstem. No acute hemorrhage. There is multifocal hyperintense T2-weighted signal within the white matter. Parenchymal volume and CSF spaces are normal. The midline structures are normal. Vascular: Major flow voids are preserved. Skull and upper cervical spine: Normal calvarium and skull base. Visualized upper cervical spine and soft tissues are normal. Sinuses/Orbits:No paranasal sinus fluid levels or advanced mucosal thickening. No mastoid or middle ear effusion. Normal orbits. MRA HEAD FINDINGS POSTERIOR CIRCULATION: --Vertebral arteries: Normal --Inferior cerebellar arteries: Normal.  --Basilar artery: Normal. --Superior cerebellar arteries: Normal. --Posterior cerebral arteries: There is now a stent in the left PCA P1 and P2 segments. There is normal distal flow related enhancement. Right PCA is normal. ANTERIOR CIRCULATION: --Intracranial internal carotid arteries: Normal. --Anterior cerebral arteries (ACA): Normal. --Middle cerebral arteries (MCA): Severe stenosis of the proximal M2 segment inferior division of the left MCA. No stenosis of the right MCA. ANATOMIC VARIANTS: None IMPRESSION: 1. Left PCA territory acute ischemia involving the left occipital lobe, medial left temporal lobe and left thalamus. No acute hemorrhage or mass effect. 2. Punctate focus of acute ischemia at the base of the right precentral gyrus. 3. Severe stenosis of the proximal M2 segment inferior division of the left MCA. 4. Status post stent placement of left PCA P1 and P2 segments with normal distal flow related enhancement. Electronically Signed   By: Cletus Gash.D.  On: 09/02/2020 03:38   MR BRAIN WO CONTRAST  Result Date: 09/02/2020 CLINICAL DATA:  Stroke follow-up status post thrombectomy. EXAM: MRI HEAD WITHOUT CONTRAST MRA HEAD WITHOUT CONTRAST TECHNIQUE: Multiplanar, multiecho pulse sequences of the brain and surrounding structures were obtained without intravenous contrast. Angiographic images of the head were obtained using MRA technique without contrast. COMPARISON:  Head CT 09/01/2020 FINDINGS: MRI HEAD FINDINGS Brain: Left PCA territory acute ischemia involving the left occipital lobe, medial left temporal lobe and left thalamus. There is also punctate focus of abnormal diffusion restriction at the base of the right precentral gyrus. Focus of chronic microhemorrhage in the brainstem. No acute hemorrhage. There is multifocal hyperintense T2-weighted signal within the white matter. Parenchymal volume and CSF spaces are normal. The midline structures are normal. Vascular: Major flow voids are  preserved. Skull and upper cervical spine: Normal calvarium and skull base. Visualized upper cervical spine and soft tissues are normal. Sinuses/Orbits:No paranasal sinus fluid levels or advanced mucosal thickening. No mastoid or middle ear effusion. Normal orbits. MRA HEAD FINDINGS POSTERIOR CIRCULATION: --Vertebral arteries: Normal --Inferior cerebellar arteries: Normal. --Basilar artery: Normal. --Superior cerebellar arteries: Normal. --Posterior cerebral arteries: There is now a stent in the left PCA P1 and P2 segments. There is normal distal flow related enhancement. Right PCA is normal. ANTERIOR CIRCULATION: --Intracranial internal carotid arteries: Normal. --Anterior cerebral arteries (ACA): Normal. --Middle cerebral arteries (MCA): Severe stenosis of the proximal M2 segment inferior division of the left MCA. No stenosis of the right MCA. ANATOMIC VARIANTS: None IMPRESSION: 1. Left PCA territory acute ischemia involving the left occipital lobe, medial left temporal lobe and left thalamus. No acute hemorrhage or mass effect. 2. Punctate focus of acute ischemia at the base of the right precentral gyrus. 3. Severe stenosis of the proximal M2 segment inferior division of the left MCA. 4. Status post stent placement of left PCA P1 and P2 segments with normal distal flow related enhancement. Electronically Signed   By: Ulyses Jarred M.D.   On: 09/02/2020 03:38   IR Intra Cran Stent  Result Date: 09/04/2020 INDICATION: Acute onset of right-sided weakness and numbness. Reportedly also had right facial numbness and right facial droop. CT angiogram of the head and neck revealed a left PCA occlusion. EXAM: 1. EMERGENT LARGE VESSEL OCCLUSION THROMBOLYSIS (POSTERIOR CIRCULATION) COMPARISON:  CT angiogram of the head and neck of September 01, 2020. MEDICATIONS: Ancef 2 g IV antibiotic was administered within 1 hour of the procedure. ANESTHESIA/SEDATION: General anesthesia CONTRAST:  Isovue 300 approximately 110 mL  FLUOROSCOPY TIME:  Fluoroscopy Time: 66 minutes 24 seconds (2192mG y). COMPLICATIONS: None immediate. TECHNIQUE: Following a full explanation of the procedure along with the potential associated complications, an informed witnessed consent was obtained. The risks of intracranial hemorrhage of 10%, worsening neurological deficit, ventilator dependency, death and inability to revascularize were all reviewed in detail with the patient. The patient was then put under general anesthesia by the Department of Anesthesiology at [facility]. The right groin was prepped and draped in the usual sterile fashion. Thereafter using modified Seldinger technique, transfemoral access into the right common femoral artery was obtained without difficulty. Over a 0.035 inch guidewire an 8 French 25 cm Pinnacle sheath was inserted. Through this, and also over a 0.035 inch guidewire a 5 [place] JB 1 catheter was advanced to the aortic arch region and selectively positioned in the left subclavian artery, and the right vertebral artery subclavian artery. FINDINGS: Left subclavian arteriogram demonstrates patency of the left vertebral artery, proximally  associated with mild to moderate tortuosity more distally. Patency is maintained of the left vertebrobasilar junction and basilar artery and the left superior inferior cerebellar artery. Unopacified blood is seen in the basilar artery from the contralateral right vertebral artery. The right subclavian arteriogram demonstrates the right subclavian artery to be widely patent. The dominant right vertebral artery demonstrates patency proximally associated with moderate tortuosity. More distally the vessel is seen to opacify to the cranial skull base. Patency is seen of the right vertebrobasilar junction and the right posterior-inferior cerebellar artery. The basilar artery, the right posterior cerebral artery, the superior cerebellar arteries and the anterior-inferior cerebellar arteries  opacify into the capillary and venous phases. Complete angiographic occlusion is seen of the left posterior cerebral artery mid P1 segment. PROCEDURE: The diagnostic JB 1 catheter in the more distal right subclavian artery was then exchanged over a 0.035 inch 300 cm Rosen exchange guidewire for an 80 cm 8 [place] Neuron Max sheath. Good aspiration obtained from the hub of the Neuron Max sheath following removal of the exchange Rosen guidewire. A control arteriogram performed through the Neuron Max sheath demonstrated no evidence of vasospasm, or of dissections. Through the Neuron Max sheath in the right subclavian artery, over a 0.035 inch Roadrunner guidewire, a 5 [place] JB 1 catheter was then advanced without difficulty to the distal right vertebral artery followed by the Neuron Max sheath. The guidewire was removed. Good aspiration obtained from the hub of the Neuron Max sheath with the distal end at the junction of the distal and the mid 1/3 of the dominant right vertebral artery. A gentle control arteriogram performed through this demonstrated no evidence of spasm, dissection or of intraluminal filling defects. Over a 0.14 inch standard Synchro micro guidewire, and inside an 055 130 cm Zoom aspiration catheter an 021 microcatheter combination was advanced to the distal basilar artery. The micro guidewire was then gently advanced through the occluded left posterior cerebral artery into the P2 P3 segment followed by the microcatheter. The micro guidewire was removed. Good aspiration obtained from the hub of the microcatheter. A gentle control arteriogram performed through the microcatheter demonstrated safe position of the tip of the microcatheter with excellent antegrade washout of the contrast. This was then connected to continuous heparinized saline infusion. A 3 mm x 40 mm Solitaire X retrieval device was then advanced to the distal end of the microcatheter and deployed by unsheathing. The 55 Zoom aspiration  catheter was now advanced into the previously occluded left posterior cerebral artery. With constant aspiration being applied at the hub of the aspiration catheter, and at the hub of the Neuron Max sheath, the combination of the retrieval device, and the microcatheter were retrieved and removed. The Zoom aspiration catheter was now retrieved to the distal right vertebrobasilar junction. Arteriogram performed through the 55 aspiration catheter now demonstrated complete revascularization of the left posterior cerebral artery achieving a TICI 3 revascularization. Also uncovered was approximately 50-60% stenosis at the site of the previous occlusion, indicative of intracranial arteriosclerosis. A post thrombectomy 10 minute arteriogram performed through the aspiration catheter now demonstrated the angiographic occlusion of the left posterior cerebral artery. At this time a CT of the brain demonstrated no evidence of intracranial hemorrhage. Over a 0.014 inch standard Synchro micro guidewire the 80 Zoom aspiration catheter, an 021 Trevo microcatheter was advanced to the proximal left posterior cerebral artery. Using a torque device, the micro guidewire was gently advanced without difficulty to the distal P2 P3 segment followed by the  microcatheter. The guidewire was removed. Good aspiration was obtained from the hub of the microcatheter. Again gentle contrast injection demonstrated safe position of the tip of the microcatheter which was now connected to continuous heparinized saline infusion. The left posterior cerebral artery P1 to P2 segment was then measured in terms of length and also diameter. It was elected to proceed with placement of a 4.5 mm x 24 mm Neuroform Atlas stent for revascularization. Prior to this, the patient was loaded with a bolus dose of weight based Aggrastat IV followed by a 4 hour infusion. Patient was also given Brilinta 180 mg, and aspirin 81 mg via an orogastric tube. The 4.5 mm x 24 mm Atlas  Neuroform stent was then advanced into the distal end of the microcatheter. The O ring on the delivery microcatheter was then loosened. The proximal and the distal landing zones were then defined. Thereafter, with slight forward gentle traction with the right hand on the delivery micro guidewire with the left hand the distal and then the proximal portion of the stent were deployed. A control arteriogram performed through the 55 aspiration catheter in the basilar artery again demonstrated reconstitution of a TICI 3 revascularization of the left posterior cerebral artery territory. Patency was also noted of the previously noted site of intracranial arteriosclerosis. The micro guidewire, and the micro guidewire were retrieved and removed. Control arteriogram were then performed at 10 and 20 minutes post stent deployment with steadily slow flow proximally and distally through the stent. Given the patient had already been loaded with dual antiplatelets as described above, Neuron Max sheath in the right vertebral artery was removed. The 8 French sheath was then replaced with an 8 French Angio-Seal closure device with hemostasis. Distal pulses continued to be patent by Doppler in both feet unchanged. CT of the brain performed in the CT suite demonstrated no evidence of hemorrhage or mass effect or midline shift. Patient was then transferred to the neuro ICU for post revascularization management. IMPRESSION: Status post endovascular complete revascularization of the left posterior cerebral artery P1 segment with 1 pass with a 3 mm x 40 mm Solitaire X retrieval device and contact aspiration achieving a TICI 3 revascularization. Status post rescue stenting of reocclusion with restoration of TICI 3 recanalization of the left posterior cerebral artery territory. Slow antegrade flow seen in the left posterior cerebral artery at the termination of the procedure. PLAN: Follow-up in clinic 2-4 weeks post discharge. Electronically  Signed   By: Luanne Bras M.D.   On: 09/02/2020 14:48   IR CT Head Ltd  Result Date: 09/04/2020 INDICATION: Acute onset of right-sided weakness and numbness. Reportedly also had right facial numbness and right facial droop. CT angiogram of the head and neck revealed a left PCA occlusion. EXAM: 1. EMERGENT LARGE VESSEL OCCLUSION THROMBOLYSIS (POSTERIOR CIRCULATION) COMPARISON:  CT angiogram of the head and neck of September 01, 2020. MEDICATIONS: Ancef 2 g IV antibiotic was administered within 1 hour of the procedure. ANESTHESIA/SEDATION: General anesthesia CONTRAST:  Isovue 300 approximately 110 mL FLUOROSCOPY TIME:  Fluoroscopy Time: 66 minutes 24 seconds (2192mG y). COMPLICATIONS: None immediate. TECHNIQUE: Following a full explanation of the procedure along with the potential associated complications, an informed witnessed consent was obtained. The risks of intracranial hemorrhage of 10%, worsening neurological deficit, ventilator dependency, death and inability to revascularize were all reviewed in detail with the patient. The patient was then put under general anesthesia by the Department of Anesthesiology at [facility]. The right groin was prepped  and draped in the usual sterile fashion. Thereafter using modified Seldinger technique, transfemoral access into the right common femoral artery was obtained without difficulty. Over a 0.035 inch guidewire an 8 French 25 cm Pinnacle sheath was inserted. Through this, and also over a 0.035 inch guidewire a 5 [place] JB 1 catheter was advanced to the aortic arch region and selectively positioned in the left subclavian artery, and the right vertebral artery subclavian artery. FINDINGS: Left subclavian arteriogram demonstrates patency of the left vertebral artery, proximally associated with mild to moderate tortuosity more distally. Patency is maintained of the left vertebrobasilar junction and basilar artery and the left superior inferior cerebellar artery.  Unopacified blood is seen in the basilar artery from the contralateral right vertebral artery. The right subclavian arteriogram demonstrates the right subclavian artery to be widely patent. The dominant right vertebral artery demonstrates patency proximally associated with moderate tortuosity. More distally the vessel is seen to opacify to the cranial skull base. Patency is seen of the right vertebrobasilar junction and the right posterior-inferior cerebellar artery. The basilar artery, the right posterior cerebral artery, the superior cerebellar arteries and the anterior-inferior cerebellar arteries opacify into the capillary and venous phases. Complete angiographic occlusion is seen of the left posterior cerebral artery mid P1 segment. PROCEDURE: The diagnostic JB 1 catheter in the more distal right subclavian artery was then exchanged over a 0.035 inch 300 cm Rosen exchange guidewire for an 80 cm 8 [place] Neuron Max sheath. Good aspiration obtained from the hub of the Neuron Max sheath following removal of the exchange Rosen guidewire. A control arteriogram performed through the Neuron Max sheath demonstrated no evidence of vasospasm, or of dissections. Through the Neuron Max sheath in the right subclavian artery, over a 0.035 inch Roadrunner guidewire, a 5 [place] JB 1 catheter was then advanced without difficulty to the distal right vertebral artery followed by the Neuron Max sheath. The guidewire was removed. Good aspiration obtained from the hub of the Neuron Max sheath with the distal end at the junction of the distal and the mid 1/3 of the dominant right vertebral artery. A gentle control arteriogram performed through this demonstrated no evidence of spasm, dissection or of intraluminal filling defects. Over a 0.14 inch standard Synchro micro guidewire, and inside an 055 130 cm Zoom aspiration catheter an 021 microcatheter combination was advanced to the distal basilar artery. The micro guidewire was then  gently advanced through the occluded left posterior cerebral artery into the P2 P3 segment followed by the microcatheter. The micro guidewire was removed. Good aspiration obtained from the hub of the microcatheter. A gentle control arteriogram performed through the microcatheter demonstrated safe position of the tip of the microcatheter with excellent antegrade washout of the contrast. This was then connected to continuous heparinized saline infusion. A 3 mm x 40 mm Solitaire X retrieval device was then advanced to the distal end of the microcatheter and deployed by unsheathing. The 1 Zoom aspiration catheter was now advanced into the previously occluded left posterior cerebral artery. With constant aspiration being applied at the hub of the aspiration catheter, and at the hub of the Neuron Max sheath, the combination of the retrieval device, and the microcatheter were retrieved and removed. The Zoom aspiration catheter was now retrieved to the distal right vertebrobasilar junction. Arteriogram performed through the 55 aspiration catheter now demonstrated complete revascularization of the left posterior cerebral artery achieving a TICI 3 revascularization. Also uncovered was approximately 50-60% stenosis at the site of the previous occlusion,  indicative of intracranial arteriosclerosis. A post thrombectomy 10 minute arteriogram performed through the aspiration catheter now demonstrated the angiographic occlusion of the left posterior cerebral artery. At this time a CT of the brain demonstrated no evidence of intracranial hemorrhage. Over a 0.014 inch standard Synchro micro guidewire the 21 Zoom aspiration catheter, an 021 Trevo microcatheter was advanced to the proximal left posterior cerebral artery. Using a torque device, the micro guidewire was gently advanced without difficulty to the distal P2 P3 segment followed by the microcatheter. The guidewire was removed. Good aspiration was obtained from the hub of the  microcatheter. Again gentle contrast injection demonstrated safe position of the tip of the microcatheter which was now connected to continuous heparinized saline infusion. The left posterior cerebral artery P1 to P2 segment was then measured in terms of length and also diameter. It was elected to proceed with placement of a 4.5 mm x 24 mm Neuroform Atlas stent for revascularization. Prior to this, the patient was loaded with a bolus dose of weight based Aggrastat IV followed by a 4 hour infusion. Patient was also given Brilinta 180 mg, and aspirin 81 mg via an orogastric tube. The 4.5 mm x 24 mm Atlas Neuroform stent was then advanced into the distal end of the microcatheter. The O ring on the delivery microcatheter was then loosened. The proximal and the distal landing zones were then defined. Thereafter, with slight forward gentle traction with the right hand on the delivery micro guidewire with the left hand the distal and then the proximal portion of the stent were deployed. A control arteriogram performed through the 55 aspiration catheter in the basilar artery again demonstrated reconstitution of a TICI 3 revascularization of the left posterior cerebral artery territory. Patency was also noted of the previously noted site of intracranial arteriosclerosis. The micro guidewire, and the micro guidewire were retrieved and removed. Control arteriogram were then performed at 10 and 20 minutes post stent deployment with steadily slow flow proximally and distally through the stent. Given the patient had already been loaded with dual antiplatelets as described above, Neuron Max sheath in the right vertebral artery was removed. The 8 French sheath was then replaced with an 8 French Angio-Seal closure device with hemostasis. Distal pulses continued to be patent by Doppler in both feet unchanged. CT of the brain performed in the CT suite demonstrated no evidence of hemorrhage or mass effect or midline shift. Patient was  then transferred to the neuro ICU for post revascularization management. IMPRESSION: Status post endovascular complete revascularization of the left posterior cerebral artery P1 segment with 1 pass with a 3 mm x 40 mm Solitaire X retrieval device and contact aspiration achieving a TICI 3 revascularization. Status post rescue stenting of reocclusion with restoration of TICI 3 recanalization of the left posterior cerebral artery territory. Slow antegrade flow seen in the left posterior cerebral artery at the termination of the procedure. PLAN: Follow-up in clinic 2-4 weeks post discharge. Electronically Signed   By: Luanne Bras M.D.   On: 09/02/2020 14:48   CT CEREBRAL PERFUSION W CONTRAST  Result Date: 09/01/2020 CLINICAL DATA:  Right-sided facial droop and weakness. Left P2 occlusion on CTA. EXAM: CT PERFUSION BRAIN TECHNIQUE: Multiphase CT imaging of the brain was performed following IV bolus contrast injection. Subsequent parametric perfusion maps were calculated using RAPID software. CONTRAST:  74mL OMNIPAQUE IOHEXOL 350 MG/ML SOLN COMPARISON:  Head and neck CTA earlier today FINDINGS: CT Brain Perfusion Findings: CBF (<30%) Volume: 0 mL Perfusion (Tmax>6.0s)  volume: 42 mL Mismatch Volume: 42 mL Infarction Location: The small acute left temporo-occipital infarct on today's noncontrast CT was not detected by automated CTP processing. There is penumbra in the left PCA territory although the reported mismatch volume also includes tissue in both cerebellar hemispheres. IMPRESSION: Left PCA penumbra which is smaller than the reported 42 mL mismatch volume as that also includes bilateral cerebellar tissue. These results were communicated to Dr. Rory Percy at 8:43 am on 09/01/2020 by text page via the [facility] messaging system. Electronically Signed   By: Logan Bores M.D.   On: 09/01/2020 08:47   DG CHEST PORT 1 VIEW  Result Date: 09/02/2020 CLINICAL DATA:  Stroke. EXAM: PORTABLE CHEST 1 VIEW COMPARISON:   09/01/2020.  07/18/2018 FINDINGS: Interim extubation and removal of NG tube. Stable cardiomegaly. No pulmonary venous congestion. No focal infiltrate. Questionable density right upper lung possibly a small area of atelectasis. Follow-up PA and lateral chest x-ray suggested pulmonary lesion. Tiny calcified pulmonary nodule in the right again noted consistent with calcified granuloma. Prominent hiatal hernia again noted. Degenerative changes scoliosis thoracic spine. IMPRESSION: 1.  Interim extubation and removal of NG tube. 2.  Stable cardiomegaly. 3. Questionable density right upper lung, possibly a small area of atelectasis. Follow-up PA lateral chest x-ray suggested to exclude a focal pulmonary lesion. 4.  Prominent hiatal hernia again noted. Electronically Signed   By: Marcello Moores  Register   On: 09/02/2020 06:29   DG Chest Port 1 View  Result Date: 09/01/2020 CLINICAL DATA:  Endotracheal tube placement. EXAM: PORTABLE CHEST 1 VIEW COMPARISON:  July 18, 2018. FINDINGS: The heart size and mediastinal contours are within normal limits. Endotracheal tube is in grossly good position. Nasogastric tube is looped within the hiatal hernia. No pneumothorax is noted. Left lung is clear. Mild right basilar subsegmental atelectasis is noted. The visualized skeletal structures are unremarkable. IMPRESSION: Endotracheal tube in grossly good position. Nasogastric tube tip is seen within the hiatal hernia. Mild right basilar subsegmental atelectasis. Electronically Signed   By: Marijo Conception M.D.   On: 09/01/2020 16:26   ECHOCARDIOGRAM COMPLETE  Result Date: 09/01/2020    ECHOCARDIOGRAM REPORT   Patient Name:   Deanna Williams [facility] Date of Exam: 09/01/2020 Medical Rec #:  [id number]       Height:       63.0 in Accession #:    [id number]      Weight:       150.0 lb Date of Birth:  [birth year]       BSA:          64.711 m Patient Age:    31 years        BP:           133/60 mmHg Patient Gender: F               HR:           86 bpm.  Exam Location:  Inpatient Procedure: 2D Echo, Color Doppler and Cardiac Doppler Indications:    Stroke I63.9  History:        Patient has no prior history of Echocardiogram examinations.                 Risk Factors:Hypertension and Dyslipidemia.  Sonographer:    Darlina Sicilian RDCS Referring Phys: [id number] [facility]  Sonographer Comments: Suboptimal parasternal window, suboptimal apical window and echo performed with patient supine and on artificial respirator. IMPRESSIONS  1. Left ventricular ejection fraction, by estimation, is 60  to 65%. The left ventricle has normal function. The left ventricle has no regional wall motion abnormalities. Left ventricular diastolic parameters are consistent with Grade I diastolic dysfunction (impaired relaxation).  2. Right ventricular systolic function is normal. The right ventricular size is normal. There is mildly elevated pulmonary artery systolic pressure.  3. The mitral valve is normal in structure. No evidence of mitral valve regurgitation. No evidence of mitral stenosis.  4. The aortic valve is normal in structure. Aortic valve regurgitation is not visualized. No aortic stenosis is present.  5. The inferior vena cava is normal in size with greater than 50% respiratory variability, suggesting right atrial pressure of 3 mmHg. FINDINGS  Left Ventricle: Left ventricular ejection fraction, by estimation, is 60 to 65%. The left ventricle has normal function. The left ventricle has no regional wall motion abnormalities. The left ventricular internal cavity size was normal in size. There is  no left ventricular hypertrophy. Left ventricular diastolic parameters are consistent with Grade I diastolic dysfunction (impaired relaxation). Normal left ventricular filling pressure. Right Ventricle: The right ventricular size is normal. No increase in right ventricular wall thickness. Right ventricular systolic function is normal. There is mildly elevated pulmonary artery systolic  pressure. The tricuspid regurgitant velocity is 2.84  m/s, and with an assumed right atrial pressure of 8 mmHg, the estimated right ventricular systolic pressure is 40.9 mmHg. Left Atrium: Left atrial size was normal in size. Right Atrium: Right atrial size was normal in size. Pericardium: There is no evidence of pericardial effusion. Mitral Valve: The mitral valve is normal in structure. No evidence of mitral valve regurgitation. No evidence of mitral valve stenosis. Tricuspid Valve: The tricuspid valve is normal in structure. Tricuspid valve regurgitation is mild . No evidence of tricuspid stenosis. Aortic Valve: The aortic valve is normal in structure. Aortic valve regurgitation is not visualized. No aortic stenosis is present. Pulmonic Valve: The pulmonic valve was normal in structure. Pulmonic valve regurgitation is not visualized. No evidence of pulmonic stenosis. Aorta: The aortic root is normal in size and structure. Venous: IVC assessment for right atrial pressure unable to be performed due to mechanical ventilation. The inferior vena cava is normal in size with greater than 50% respiratory variability, suggesting right atrial pressure of 3 mmHg. IAS/Shunts: No atrial level shunt detected by color flow Doppler.  LEFT VENTRICLE PLAX 2D LVIDd:         3.80 cm  Diastology LVIDs:         2.30 cm  LV e' medial:    6.31 cm/s LV PW:         0.90 cm  LV E/e' medial:  9.4 LV IVS:        0.90 cm  LV e' lateral:   9.14 cm/s LVOT diam:     2.00 cm  LV E/e' lateral: 6.5 LV SV:         54 LV SV Index:   32 LVOT Area:     3.14 cm  RIGHT VENTRICLE RV S prime:     12.90 cm/s LEFT ATRIUM             Index LA diam:        3.30 cm 1.93 cm/m LA Vol (A2C):   30.6 ml 17.88 ml/m LA Vol (A4C):   47.0 ml 27.47 ml/m LA Biplane Vol: 40.8 ml 23.84 ml/m  AORTIC VALVE LVOT Vmax:   95.80 cm/s LVOT Vmean:  55.500 cm/s LVOT VTI:    0.172 m  AORTA  Ao Root diam: 2.90 cm MITRAL VALVE               TRICUSPID VALVE MV Area (PHT): 5.54 cm     TR Peak grad:   32.3 mmHg MV Decel Time: 137 msec    TR Vmax:        284.00 cm/s MV E velocity: 59.60 cm/s MV A velocity: 87.00 cm/s  SHUNTS MV E/A ratio:  0.69        Systemic VTI:  0.17 m                            Systemic Diam: 2.00 cm Dani Gobble Croitoru MD Electronically signed by Sanda Klein MD Signature Date/Time: 09/01/2020/2:22:50 PM    Final    IR PERCUTANEOUS ART THROMBECTOMY/INFUSION INTRACRANIAL INC DIAG ANGIO  Result Date: 09/04/2020 INDICATION: Acute onset of right-sided weakness and numbness. Reportedly also had right facial numbness and right facial droop. CT angiogram of the head and neck revealed a left PCA occlusion. EXAM: 1. EMERGENT LARGE VESSEL OCCLUSION THROMBOLYSIS (POSTERIOR CIRCULATION) COMPARISON:  CT angiogram of the head and neck of September 01, 2020. MEDICATIONS: Ancef 2 g IV antibiotic was administered within 1 hour of the procedure. ANESTHESIA/SEDATION: General anesthesia CONTRAST:  Isovue 300 approximately 110 mL FLUOROSCOPY TIME:  Fluoroscopy Time: 66 minutes 24 seconds (2192mG y). COMPLICATIONS: None immediate. TECHNIQUE: Following a full explanation of the procedure along with the potential associated complications, an informed witnessed consent was obtained. The risks of intracranial hemorrhage of 10%, worsening neurological deficit, ventilator dependency, death and inability to revascularize were all reviewed in detail with the patient. The patient was then put under general anesthesia by the Department of Anesthesiology at [facility]. The right groin was prepped and draped in the usual sterile fashion. Thereafter using modified Seldinger technique, transfemoral access into the right common femoral artery was obtained without difficulty. Over a 0.035 inch guidewire an 8 French 25 cm Pinnacle sheath was inserted. Through this, and also over a 0.035 inch guidewire a 5 [place] JB 1 catheter was advanced to the aortic arch region and selectively positioned in the left  subclavian artery, and the right vertebral artery subclavian artery. FINDINGS: Left subclavian arteriogram demonstrates patency of the left vertebral artery, proximally associated with mild to moderate tortuosity more distally. Patency is maintained of the left vertebrobasilar junction and basilar artery and the left superior inferior cerebellar artery. Unopacified blood is seen in the basilar artery from the contralateral right vertebral artery. The right subclavian arteriogram demonstrates the right subclavian artery to be widely patent. The dominant right vertebral artery demonstrates patency proximally associated with moderate tortuosity. More distally the vessel is seen to opacify to the cranial skull base. Patency is seen of the right vertebrobasilar junction and the right posterior-inferior cerebellar artery. The basilar artery, the right posterior cerebral artery, the superior cerebellar arteries and the anterior-inferior cerebellar arteries opacify into the capillary and venous phases. Complete angiographic occlusion is seen of the left posterior cerebral artery mid P1 segment. PROCEDURE: The diagnostic JB 1 catheter in the more distal right subclavian artery was then exchanged over a 0.035 inch 300 cm Rosen exchange guidewire for an 80 cm 8 [place] Neuron Max sheath. Good aspiration obtained from the hub of the Neuron Max sheath following removal of the exchange Rosen guidewire. A control arteriogram performed through the Neuron Max sheath demonstrated no evidence of vasospasm, or of dissections. Through the Neuron Max sheath in the  right subclavian artery, over a 0.035 inch Roadrunner guidewire, a 5 [place] JB 1 catheter was then advanced without difficulty to the distal right vertebral artery followed by the Neuron Max sheath. The guidewire was removed. Good aspiration obtained from the hub of the Neuron Max sheath with the distal end at the junction of the distal and the mid 1/3 of the dominant right  vertebral artery. A gentle control arteriogram performed through this demonstrated no evidence of spasm, dissection or of intraluminal filling defects. Over a 0.14 inch standard Synchro micro guidewire, and inside an 055 130 cm Zoom aspiration catheter an 021 microcatheter combination was advanced to the distal basilar artery. The micro guidewire was then gently advanced through the occluded left posterior cerebral artery into the P2 P3 segment followed by the microcatheter. The micro guidewire was removed. Good aspiration obtained from the hub of the microcatheter. A gentle control arteriogram performed through the microcatheter demonstrated safe position of the tip of the microcatheter with excellent antegrade washout of the contrast. This was then connected to continuous heparinized saline infusion. A 3 mm x 40 mm Solitaire X retrieval device was then advanced to the distal end of the microcatheter and deployed by unsheathing. The 51 Zoom aspiration catheter was now advanced into the previously occluded left posterior cerebral artery. With constant aspiration being applied at the hub of the aspiration catheter, and at the hub of the Neuron Max sheath, the combination of the retrieval device, and the microcatheter were retrieved and removed. The Zoom aspiration catheter was now retrieved to the distal right vertebrobasilar junction. Arteriogram performed through the 55 aspiration catheter now demonstrated complete revascularization of the left posterior cerebral artery achieving a TICI 3 revascularization. Also uncovered was approximately 50-60% stenosis at the site of the previous occlusion, indicative of intracranial arteriosclerosis. A post thrombectomy 10 minute arteriogram performed through the aspiration catheter now demonstrated the angiographic occlusion of the left posterior cerebral artery. At this time a CT of the brain demonstrated no evidence of intracranial hemorrhage. Over a 0.014 inch standard  Synchro micro guidewire the 66 Zoom aspiration catheter, an 021 Trevo microcatheter was advanced to the proximal left posterior cerebral artery. Using a torque device, the micro guidewire was gently advanced without difficulty to the distal P2 P3 segment followed by the microcatheter. The guidewire was removed. Good aspiration was obtained from the hub of the microcatheter. Again gentle contrast injection demonstrated safe position of the tip of the microcatheter which was now connected to continuous heparinized saline infusion. The left posterior cerebral artery P1 to P2 segment was then measured in terms of length and also diameter. It was elected to proceed with placement of a 4.5 mm x 24 mm Neuroform Atlas stent for revascularization. Prior to this, the patient was loaded with a bolus dose of weight based Aggrastat IV followed by a 4 hour infusion. Patient was also given Brilinta 180 mg, and aspirin 81 mg via an orogastric tube. The 4.5 mm x 24 mm Atlas Neuroform stent was then advanced into the distal end of the microcatheter. The O ring on the delivery microcatheter was then loosened. The proximal and the distal landing zones were then defined. Thereafter, with slight forward gentle traction with the right hand on the delivery micro guidewire with the left hand the distal and then the proximal portion of the stent were deployed. A control arteriogram performed through the 55 aspiration catheter in the basilar artery again demonstrated reconstitution of a TICI 3 revascularization of the left  posterior cerebral artery territory. Patency was also noted of the previously noted site of intracranial arteriosclerosis. The micro guidewire, and the micro guidewire were retrieved and removed. Control arteriogram were then performed at 10 and 20 minutes post stent deployment with steadily slow flow proximally and distally through the stent. Given the patient had already been loaded with dual antiplatelets as described  above, Neuron Max sheath in the right vertebral artery was removed. The 8 French sheath was then replaced with an 8 French Angio-Seal closure device with hemostasis. Distal pulses continued to be patent by Doppler in both feet unchanged. CT of the brain performed in the CT suite demonstrated no evidence of hemorrhage or mass effect or midline shift. Patient was then transferred to the neuro ICU for post revascularization management. IMPRESSION: Status post endovascular complete revascularization of the left posterior cerebral artery P1 segment with 1 pass with a 3 mm x 40 mm Solitaire X retrieval device and contact aspiration achieving a TICI 3 revascularization. Status post rescue stenting of reocclusion with restoration of TICI 3 recanalization of the left posterior cerebral artery territory. Slow antegrade flow seen in the left posterior cerebral artery at the termination of the procedure. PLAN: Follow-up in clinic 2-4 weeks post discharge. Electronically Signed   By: Luanne Bras M.D.   On: 09/02/2020 14:48   CT HEAD CODE STROKE WO CONTRAST  Result Date: 09/01/2020 CLINICAL DATA:  Code stroke. Right-sided facial droop and weakness since 10 p.m. EXAM: CT HEAD WITHOUT CONTRAST TECHNIQUE: Contiguous axial images were obtained from the base of the skull through the vertex without intravenous contrast. COMPARISON:  Brain MRI 08/23/2013 FINDINGS: Brain: Subtle low-density in the left temporal occipital cortex. No other visible infarct. No hemorrhage, hydrocephalus, or collection. Vascular: Atheromatous calcification. Especially on coronal imaging the left P 2 or P3 segment appears dense Skull: Normal. Negative for fracture or focal lesion. Sinuses/Orbits: Negative Other: These results were called by telephone at the time of interpretation on 09/01/2020 at 7:04 am to provider Dr Charna Archer, who verbally acknowledged these results. IMPRESSION: 1. Small acute infarct in the left occipital parietal cortex. The  adjacent PCA is hyperdense on reformats, suggesting acute thrombosis. The visible infarct is smaller than would be expected for the level of vessel hyperdensity, consider CTA/perfusion if a treatment candidate. 2. No intracranial hemorrhage. Electronically Signed   By: Monte Fantasia M.D.   On: 09/01/2020 07:08   VAS [place] LOWER EXTREMITY VENOUS (DVT)  Result Date: 09/03/2020  Lower Venous DVT Study Indications: Pain.  Limitations: Bandages. Comparison Study: no prior Performing Technologist: Abram Sander RVS  Examination Guidelines: A complete evaluation includes B-mode imaging, spectral Doppler, color Doppler, and power Doppler as needed of all accessible portions of each vessel. Bilateral testing is considered an integral part of a complete examination. Limited examinations for reoccurring indications may be performed as noted. The reflux portion of the exam is performed with the patient in reverse Trendelenburg.  +---------+---------------+---------+-----------+----------+--------------+ RIGHT    CompressibilityPhasicitySpontaneityPropertiesThrombus Aging +---------+---------------+---------+-----------+----------+--------------+ CFV                                                   bandages       +---------+---------------+---------+-----------+----------+--------------+ SFJ  bandages       +---------+---------------+---------+-----------+----------+--------------+ FV Prox                                               bandages       +---------+---------------+---------+-----------+----------+--------------+ FV Mid   Full                                                        +---------+---------------+---------+-----------+----------+--------------+ FV DistalFull                                                        +---------+---------------+---------+-----------+----------+--------------+ PFV      Full                                                         +---------+---------------+---------+-----------+----------+--------------+ POP      Full           Yes      Yes                                 +---------+---------------+---------+-----------+----------+--------------+ PTV      Full                                                        +---------+---------------+---------+-----------+----------+--------------+ PERO     Full                                                        +---------+---------------+---------+-----------+----------+--------------+   +---------+---------------+---------+-----------+----------+--------------+ LEFT     CompressibilityPhasicitySpontaneityPropertiesThrombus Aging +---------+---------------+---------+-----------+----------+--------------+ CFV      Full           Yes      Yes                                 +---------+---------------+---------+-----------+----------+--------------+ SFJ      Full                                                        +---------+---------------+---------+-----------+----------+--------------+ FV Prox  Full                                                        +---------+---------------+---------+-----------+----------+--------------+  FV Mid   Full                                                        +---------+---------------+---------+-----------+----------+--------------+ FV DistalFull                                                        +---------+---------------+---------+-----------+----------+--------------+ PFV      Full                                                        +---------+---------------+---------+-----------+----------+--------------+ POP      Full           Yes      Yes                                 +---------+---------------+---------+-----------+----------+--------------+ PTV      Full                                                         +---------+---------------+---------+-----------+----------+--------------+ PERO     Full                                                        +---------+---------------+---------+-----------+----------+--------------+     Summary: BILATERAL: - No evidence of deep vein thrombosis seen in the lower extremities, bilaterally. - No evidence of superficial venous thrombosis in the lower extremities, bilaterally. -No evidence of popliteal cyst, bilaterally.   *See table(s) above for measurements and observations. Electronically signed by Harold Barban MD on 09/03/2020 at 9:26:38 PM.    Final     12-lead ECG on arrival showed NSR at 74 bpm (personally reviewed) All prior EKG's in EPIC reviewed with no documented atrial fibrillation  Telemetry NSR 70-80s, This am she had a brief period of AF in the 120-130s (personally reviewed)  Assessment and Plan:  1. Cryptogenic stroke -> paroxysmal AF identified on tele Recovering well from stroke. Plan on CIR She was asymptomatic from her AF; new diagnosis this admission. Deanna Williams have pharmacy dose Eliquis for CHA2DS2VASC of at least 5.  Should qualify for normal dosing.  Echo with LVEF 60-65%, normal LA and RA size.  Discussed new diagnosis with patient and her Son and Daughter in Sports coach via phone.  Deanna Williams follow up in 4-6 weeks in EP clinic for continued cardiac care.   Deanna Friar, PA-C 09/04/2020 11:47 AM  I have seen and examined this patient with Oda Kilts.  Agree with above, note added to reflect my findings.  On exam, RRR, no murmurs.  Patient presented to the hospital individually  with cryptogenic stroke.  Work-up is thus far been negative.  We were consulted for possible Linq monitor implant.  In review of telemetry, she was noted to have an episode of atrial fibrillation.  We Shaylene Paganelli consult pharmacy for Eliquis started.  We Drey Shaff arrange for follow-up in cardiology clinic.  Moria Brophy M. Tatumn Corbridge MD 09/04/2020 2:05 PM

## 2020-09-03 ENCOUNTER — Inpatient Hospital Stay (HOSPITAL_COMMUNITY): Payer: PPO

## 2020-09-03 DIAGNOSIS — M79661 Pain in right lower leg: Secondary | ICD-10-CM

## 2020-09-03 DIAGNOSIS — M79604 Pain in right leg: Secondary | ICD-10-CM

## 2020-09-03 LAB — BASIC METABOLIC PANEL
Anion gap: 6 (ref 5–15)
BUN: 11 mg/dL (ref 8–23)
CO2: 23 mmol/L (ref 22–32)
Calcium: 8.5 mg/dL — ABNORMAL LOW (ref 8.9–10.3)
Chloride: 108 mmol/L (ref 98–111)
Creatinine, Ser: 0.71 mg/dL (ref 0.44–1.00)
GFR, Estimated: >60 mL/min (ref >60)
Glucose, Bld: 114 mg/dL — ABNORMAL HIGH (ref 70–99)
Potassium: 4 mmol/L (ref 3.5–5.1)
Sodium: 137 mmol/L (ref 135–145)

## 2020-09-03 LAB — CBC
HCT: 38.2 % (ref 36.0–46.0)
Hemoglobin: 12.6 g/dL (ref 12.0–15.0)
MCH: 31.2 pg (ref 26.0–34.0)
MCHC: 33 g/dL (ref 30.0–36.0)
MCV: 94.6 fL (ref 80.0–100.0)
Platelets: 221 10*3/uL (ref 150–400)
RBC: 4.04 MIL/uL (ref 3.87–5.11)
RDW: 12.5 % (ref 11.5–15.5)
WBC: 8.2 10*3/uL (ref 4.0–10.5)
nRBC: 0 % (ref 0.0–0.2)

## 2020-09-03 LAB — GLUCOSE, CAPILLARY
Glucose-Capillary: 133 mg/dL — ABNORMAL HIGH (ref 70–99)
Glucose-Capillary: 109 mg/dL — ABNORMAL HIGH (ref 70–99)

## 2020-09-03 MED ORDER — ATORVASTATIN CALCIUM 10 MG PO TABS
20.0000 mg | ORAL_TABLET | Freq: Every day | ORAL | Status: DC
  Administered 2020-09-03 – 2020-09-05 (×3): 20 mg via ORAL
  Filled 2020-09-03 (×3): qty 2

## 2020-09-03 MED ORDER — FENTANYL CITRATE (PF) 100 MCG/2ML IJ SOLN
12.5000 ug | Freq: Once | INTRAMUSCULAR | Status: AC
  Administered 2020-09-03: 12.5 ug via INTRAVENOUS
  Filled 2020-09-03: qty 2

## 2020-09-03 NOTE — Progress Notes (Signed)
Physical Therapy Treatment Patient Details Name: Deanna Williams MRN: [id number] DOB: 02-11-1929 Today's Date: 09/03/2020    History of Present Illness 87 y.o. female admitted 4/19 with Rt sided weakness.  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  She underwent placement of Lt PCA stent.  PMH includes: palpatations, osteopenia, Lichen sclerousus et atrophicus, HTN    PT Comments    Patient progressing towards physical therapy goals. Patient progressed ambulation distance to 61' with modA and RW, noted R ankle instability. Encouraged family to bring non-skid shoes for next session for support around the ankle. Patient motivated to return to PLOF. Continue to recommend comprehensive inpatient rehab (CIR) for post-acute therapy needs.     Follow Up Recommendations  CIR     Equipment Recommendations  Rolling Webster Patrone with 5" wheels    Recommendations for Other Services       Precautions / Restrictions Precautions Precautions: Fall Restrictions Weight Bearing Restrictions: No    Mobility  Bed Mobility Overal bed mobility: Needs Assistance Bed Mobility: Supine to Sit     Supine to sit: Min guard     General bed mobility comments: min guard for safety    Transfers Overall transfer level: Needs assistance Equipment used: Rolling Pavneet [place] (2 wheeled) Transfers: Sit to/from Stand Sit to Stand: Min assist         General transfer comment: minA for boost up. Cues for hand placement. Sit to stand x 4  Ambulation/Gait Ambulation/Gait assistance: +2 safety/equipment;Mod assist Gait Distance (Feet): 40 Feet Assistive device: Rolling Glory Graefe (2 wheeled) Gait Pattern/deviations: Step-through pattern;Decreased step length - right;Decreased step length - left;Decreased stride length;Decreased weight shift to  right;Decreased weight shift to left;Decreased dorsiflexion - right;Trunk flexed Gait velocity: decreased   General Gait Details: noted R ankle instability with ambulation requiring blocking to prevent inversion. Leans R at times with progression. Cues for increasing step length on the R   Stairs             Wheelchair Mobility    Modified Rankin (Stroke Patients Only) Modified Rankin (Stroke Patients Only) Pre-Morbid Rankin Score: No symptoms Modified Rankin: Moderately severe disability     Balance Overall balance assessment: Needs assistance Sitting-balance support: Feet supported;Single extremity supported Sitting balance-Leahy Scale: Fair Sitting balance - Comments: close supervision for safety   Standing balance support: Bilateral upper extremity supported Standing balance-Leahy Scale: Poor Standing balance comment: reliant on UE support and external assist                            Cognition Arousal/Alertness: Awake/alert Behavior During Therapy: WFL for tasks assessed/performed Overall Cognitive Status: Impaired/Different from baseline Area of Impairment: Attention;Following commands;Awareness;Problem solving                   Current Attention Level: Selective   Following Commands: Follows one step commands consistently;Follows multi-step commands inconsistently;Follows multi-step commands with increased time   Awareness: Emergent Problem Solving: Slow processing;Difficulty sequencing;Requires verbal cues;Requires tactile cues        Exercises      General Comments        Pertinent Vitals/Pain Pain Assessment: No/denies pain    Home Living  Prior Function            PT Goals (current goals can now be found in the care plan section) Acute Rehab PT Goals Patient Stated Goal: to get better PT Goal Formulation: With patient/family Time For Goal Achievement: 09/16/20 Potential to Achieve Goals:  Good Progress towards PT goals: Progressing toward goals    Frequency    Min 4X/week      PT Plan Current plan remains appropriate    Co-evaluation              AM-PAC PT "6 Clicks" Mobility   Outcome Measure  Help needed turning from your back to your side while in a flat bed without using bedrails?: A Little Help needed moving from lying on your back to sitting on the side of a flat bed without using bedrails?: A Little Help needed moving to and from a bed to a chair (including a wheelchair)?: A Little Help needed standing up from a chair using your arms (e.g., wheelchair or bedside chair)?: A Little Help needed to walk in hospital room?: A Lot Help needed climbing 3-5 steps with a railing? : A Lot 6 Click Score: 16    End of Session Equipment Utilized During Treatment: Gait belt Activity Tolerance: Patient tolerated treatment well Patient left: in chair;with call bell/phone within reach;with chair alarm set;with family/visitor present Nurse Communication: Mobility status PT Visit Diagnosis: Unsteadiness on feet (R26.81);Other abnormalities of gait and mobility (R26.89);Muscle weakness (generalized) (M62.81);Difficulty in walking, not elsewhere classified (R26.2);Other symptoms and signs involving the nervous system (R29.898);Hemiplegia and hemiparesis Hemiplegia - Right/Left: Right Hemiplegia - dominant/non-dominant: Non-dominant Hemiplegia - caused by: Cerebral infarction     Time: 1201-1227 PT Time Calculation (min) (ACUTE ONLY): 26 min  Charges:  $Gait Training: 23-37 mins                     Darrell Leonhardt A. Gilford Rile PT, DPT Acute Rehabilitation Services Pager [phone] Office [phone]    Linna Hoff 09/03/2020, 1:33 PM

## 2020-09-03 NOTE — PMR Pre-admission (Signed)
PMR Admission Coordinator Pre-Admission Assessment  Patient: Deanna Williams is an 87 y.o., female MRN: [id number] DOB: 10-19-1928 Height:   Weight: 72.3 kg              Insurance Information HMO: Yes    PPO:       PCP:       IPA:       80/20:       OTHER:   PRIMARY: [employer]      Policy#: [id number]      Subscriber: patient CM Name: Christinia Gully      Phone#: [phone]   Fax#: [phone] Pre cert # [id number] Christinia Gully from [facility] called with approval on 09/04/20 for admission 09/04/20 for 7 days.  Pre-Cert#: [id number]    Employer: Retired Benefits:  Phone #: [phone]     Name: Mateo Flow. Date: 05/16/14     Deduct: $0      Out of Pocket Max: $3450 (met $93.04)      Life Max: N/A  CIR: $325 days 1-6      SNF: $0 days 1-20; $184 days 21-100 Outpatient: medical necessity     Co-Pay: $15/visit copay Home Health: 80%      Co-Pay: 20% DME: 80%     Co-Pay: 20%  Providers: in network  SECONDARY:        Policy#:        Phone#:    Development worker, community:        Phone#:    The Engineer, petroleum" for patients in Inpatient Rehabilitation Facilities with attached "Privacy Act [place] Records" was provided and verbally reviewed with: Patient and Family  Emergency Contact Information Contact Information    Name Relation Home Work Mobile   Waldvogel,Maynard Son [phone]     January,Pam Other [phone]       Current Medical History  Patient Admitting Diagnosis: CVA  History of Present Illness: Deanna Williams is a 87 y.o. female with history of HTN, palpitations who was admitted via [facility] on 09/01/20 with right sided weakness with numbness.  CT head showed small acute infarct in left occipital parietal cortex with hyperdense signal suggesting acute thrombosis. She underwent cerebral angio with thrombectomy and rescue stent for reocclusion by Dr. Estanislado Pandy. MRI brain done for follow up and revealed L-PCA ischemia involving occipital, medial  temporal and thalamus. 2D echo showed EF 60-65% with no wall abnormality and plans for loop recorder in am. Therapy evaluations in progress and patient limited by weakness with mild cognitive deficits. CIR recommended due to functional decline. Left hand edematous and mildly painful.   Complete NIHSS TOTAL: 2 Glasgow Coma Scale Score: 15  Past Medical History  Past Medical History:  Diagnosis Date  . Gastroesophageal reflux disease   . Hyperlipidemia   . Hypertension   . Lichen sclerosus   . Lichen sclerosus et atrophicus   . Osteopenia   . Palpitations     Family History  family history includes Arthritis in her sister; Cancer in her father and mother; Hypertension in her father; Leukemia in her mother.  Prior Rehab/Hospitalizations:  Has the patient had prior rehab or hospitalizations prior to admission? No  Has the patient had major surgery during 100 days prior to admission? No  Current Medications   Current Facility-Administered Medications:  .  acetaminophen (TYLENOL) tablet 650 mg, 650 mg, Oral, Q4H PRN, 650 mg at 09/03/20 1102 **OR** acetaminophen (TYLENOL) 160 MG/5ML solution 650 mg, 650 mg, Per Tube, Q4H PRN **OR**  acetaminophen (TYLENOL) suppository 650 mg, 650 mg, Rectal, Q4H PRN, Deveshwar, Sanjeev, MD .  amLODipine (NORVASC) tablet 10 mg, 10 mg, Oral, Daily, Chand, Sudham, MD, 10 mg at 09/03/20 1102 .  aspirin chewable tablet 81 mg, 81 mg, Oral, Daily, 81 mg at 09/03/20 1102 **OR** aspirin chewable tablet 81 mg, 81 mg, Per Tube, Daily, Deveshwar, Sanjeev, MD .  atorvastatin (LIPITOR) tablet 20 mg, 20 mg, Oral, Daily, Rosalin Hawking, MD, 20 mg at 09/03/20 1101 .  Chlorhexidine Gluconate Cloth 2 % PADS 6 each, 6 each, Topical, Daily, Rosalin Hawking, MD, 6 each at 09/03/20 1103 .  docusate sodium (COLACE) capsule 100 mg, 100 mg, Oral, BID PRN, Magdalen Spatz, NP .  enoxaparin (LOVENOX) injection 40 mg, 40 mg, Subcutaneous, Q24H, Chand, Sudham, MD, 40 mg at 09/03/20 1103 .   labetalol (NORMODYNE) injection 5-10 mg, 5-10 mg, Intravenous, Q2H PRN, Rosalin Hawking, MD .  nitroGLYCERIN 25 mcg in sodium chloride (PF) 0.9 % 59.71 mL (25 mcg/mL) syringe, , Intra-arterial, PRN, Luanne Bras, MD, 25 mcg at 09/01/20 1036 .  ondansetron (ZOFRAN) injection 4 mg, 4 mg, Intravenous, Q6H PRN, Elie Confer, Sarah F, NP .  polyethylene glycol (MIRALAX / GLYCOLAX) packet 17 g, 17 g, Oral, Daily PRN, Magdalen Spatz, NP .  senna-docusate (Senokot-S) tablet 1 tablet, 1 tablet, Oral, QHS PRN, Rosalin Hawking, MD .  ticagrelor [facility]) tablet 90 mg, 90 mg, Oral, BID, 90 mg at 09/03/20 1103 **OR** ticagrelor (BRILINTA) tablet 90 mg, 90 mg, Per Tube, BID, Deveshwar, Sanjeev, MD, 90 mg at 09/01/20 2101  Patients Current Diet:  Diet Order            Diet Heart Room service appropriate? Yes; Fluid consistency: Thin  Diet effective now                 Precautions / Restrictions Precautions Precautions: Fall Precaution Comments: heavy Rt lateral lean Restrictions Weight Bearing Restrictions: No   Has the patient had 2 or more falls or a fall with injury in the past year?No  Prior Activity Level Community (5-7x/wk): Went out daily.  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  Prior Functional Level Prior Function Level of Independence: Independent Comments: drives, grocery shops, cooks, walks her dog  Self Care: Did the patient need help bathing, dressing, using the toilet or eating?  Independent  Indoor Mobility: Did the patient need assistance with walking from room to room (with or without device)? Independent  Stairs: Did the patient need assistance with internal or external stairs (with or without device)? Independent  Functional Cognition: Did the patient need help planning regular tasks such as shopping or remembering to take medications? Independent  Home Assistive Devices / Equipment Home Assistive Devices/Equipment: None Home Equipment: Grab bars - toilet,Grab bars -  tub/shower,Hand held shower head,None  Prior Device Use: Indicate devices/aids used by the patient prior to current illness, exacerbation or injury? None of the above  Current Functional Level Cognition  Arousal/Alertness: Awake/alert Overall Cognitive Status: Impaired/Different from baseline Current Attention Level: Selective Orientation Level: Oriented X4 Following Commands: Follows one step commands consistently,Follows multi-step commands inconsistently,Follows multi-step commands with increased time General Comments: Pt with poor awareness of deficits and impact on function, needing repeated education on deficits and cause. Attention: Focused,Sustained,Alternating Focused Attention: Appears intact Sustained Attention: Impaired Sustained Attention Impairment: Verbal complex,Functional complex Alternating Attention: Impaired Alternating Attention Impairment: Verbal complex,Functional complex Memory: Impaired Memory Impairment: Decreased recall of new information,Decreased short term memory Decreased Short Term Memory: Verbal  complex Awareness: Impaired Problem Solving: Impaired Problem Solving Impairment: Verbal complex,Functional complex Executive Function: Organizing,Self Monitoring Organizing: Impaired Organizing Impairment: Verbal complex,Functional complex Self Monitoring: Impaired Self Monitoring Impairment: Verbal complex,Functional complex Safety/Judgment: Impaired    Extremity Assessment (includes Sensation/Coordination)  Upper Extremity Assessment: Defer to OT evaluation RUE Deficits / Details: mild dysmetria RUE Coordination: decreased fine motor,decreased gross motor  Lower Extremity Assessment: RLE deficits/detail RLE Deficits / Details: Decreased strength compared to L noted with functional mobility, primarily in gluts, quads, and peroneals. RLE Sensation: decreased proprioception RLE Coordination: decreased fine motor,decreased gross motor    ADLs  Overall  ADL's : Needs assistance/impaired Eating/Feeding: Modified independent,Sitting,Bed level Grooming: Wash/dry hands,Wash/dry face,Oral care,Brushing hair,Set up,Supervision/safety,Sitting Grooming Details (indicate cue type and reason): looses balance to the Rt Upper Body Bathing: Minimal assistance,Sitting Upper Body Bathing Details (indicate cue type and reason): for sitting balance Lower Body Bathing: Moderate assistance,Sit to/from stand Upper Body Dressing : Moderate assistance,Sitting Lower Body Dressing: Moderate assistance,Sit to/from stand Lower Body Dressing Details (indicate cue type and reason): Pt looses balance to the Rt with poor awareness and with decreased righting reaction.  She requires mod A for sitting balance.  Assist to pull socks over her feet Toilet Transfer: Moderate assistance,+2 for physical assistance,+2 for safety/equipment,Stand-pivot,BSC,RW Toilet Transfer Details (indicate cue type and reason): assist for balance and walker safety Toileting- Clothing Manipulation and Hygiene: Moderate assistance,+2 for physical assistance,+2 for safety/equipment,Sit to/from stand Toileting - Clothing Manipulation Details (indicate cue type and reason): Pt leans heavily to the Rt Functional mobility during ADLs: Moderate assistance,+2 for physical assistance,+2 for safety/equipment,Rolling walker    Mobility  Overal bed mobility: Needs Assistance Bed Mobility: Supine to Sit Supine to sit: Min guard General bed mobility comments: min guard for safety    Transfers  Overall transfer level: Needs assistance Equipment used: Rolling walker (2 wheeled) Transfers: Sit to/from Stand Sit to Stand: Min assist Stand pivot transfers: Mod assist,+2 physical assistance,+2 safety/equipment General transfer comment: minA for boost up. Cues for hand placement. Sit to stand x 4    Ambulation / Gait / Stairs / Wheelchair Mobility  Ambulation/Gait Ambulation/Gait assistance: +2  safety/equipment,Mod assist Gait Distance (Feet): 40 Feet Assistive device: Rolling walker (2 wheeled) Gait Pattern/deviations: Step-through pattern,Decreased step length - right,Decreased step length - left,Decreased stride length,Decreased weight shift to right,Decreased weight shift to left,Decreased dorsiflexion - right,Trunk flexed General Gait Details: noted R ankle instability with ambulation requiring blocking to prevent inversion. Leans R at times with progression. Cues for increasing step length on the R Gait velocity: decreased Gait velocity interpretation: <1.31 ft/sec, indicative of household ambulator    Posture / Balance Dynamic Sitting Balance Sitting balance - Comments: close supervision for safety Balance Overall balance assessment: Needs assistance Sitting-balance support: Feet supported,Single extremity supported Sitting balance-Leahy Scale: Fair Sitting balance - Comments: close supervision for safety Postural control: Right lateral lean Standing balance support: Bilateral upper extremity supported Standing balance-Leahy Scale: Poor Standing balance comment: reliant on UE support and external assist    Special needs/care consideration Skin Echhymosis L arm, surgical incision      Previous Home Environment (from acute therapy documentation) Living Arrangements: Alone  Lives With: Alone Available Help at Discharge: Family Type of Home: Independent living facility Home Layout: One level Home Access: Level entry Bathroom Shower/Tub: Multimedia programmer: Handicapped [place]: No Additional Comments: Pt lives in a retirement community with her yorkie  Discharge Living Setting Plans for Discharge Living Setting: [place] (Lives in [place]  retirement community) Type of Home at Discharge: House Discharge Home Layout: One level Discharge Home Access: Level entry Discharge Bathroom Shower/Tub: Tub/shower unit,Walk-in  shower,Curtain Discharge Bathroom Toilet: Handicapped height Discharge Bathroom Accessibility: Yes How Accessible: Accessible via wheelchair,Accessible via walker Does the patient have any problems obtaining your medications?: No  Social/Family/Support Systems Patient Roles: Parent,Other (Comment) (Has son, dtr in law and grand daughter.) Contact Information: Wynette [place] - son - [phone]- Anticipated Caregiver: great grand daughter or can hire help Ability/Limitations of Caregiver: Pt.'s granddaughter in law, Acasia Skilton to come and stay with Pt. In her ILF apartment. Her number is [phone] Caregiver Availability: 24/7 Discharge Plan Discussed with Primary Caregiver: Yes Is Caregiver In Agreement with Plan?: Yes Does Caregiver/Family have Issues with Lodging/Transportation while Pt is in Rehab?: No  Goals Patient/Family Goal for Rehab: PT/OT supervision to mod I goals Expected length of stay: 7-10 days Cultural Considerations: none Pt/Family Agrees to Admission and willing to participate: Yes Program Orientation Provided & Reviewed with Pt/Caregiver Including Roles  & Responsibilities: Yes  Decrease burden of Care through IP rehab admission: N/A  Possible need for SNF placement upon discharge: Not anticipated  Patient Condition: This patient's medical and functional status has changed since the consult dated: 09/02/20 in which the Rehabilitation Physician determined and documented that the patient's condition is appropriate for intensive rehabilitative care in an inpatient rehabilitation facility. See "History of Present Illness" (above) for medical update. Functional changes are:Pt. Min A with transfer,s Mod A+2 for 40 ft of gait.. Patient's medical and functional status update has been discussed with the Rehabilitation physician and patient remains appropriate for inpatient rehabilitation. Will admit to inpatient rehab 09/05/2020.  Preadmission Screen Completed By:  Retta Diones, RN, 09/03/2020 3:15 PM ______________________________________________________________________   Discussed status with Dr. Naaman Plummer on 09/04/20 at 11:00am and received approval for admission 09/05/2020.  Admission Coordinator:  Retta Diones,  With updates by Clemens Catholic time 1640 Sudie Grumbling 09/04/20

## 2020-09-03 NOTE — Progress Notes (Signed)
IP rehab admissions - I met with patient and her grand daughter/ great grand daughter at the bedside.  They have requested that I call her son.  They are interested in CIR.  A rehab MD consult is pending.  I will await rehab consult.  I have begun insurance pre-authorization for acute inpatient rehab admission.  I will have my partner follow up tomorrow.  Call for questions.  [phone]

## 2020-09-03 NOTE — Progress Notes (Signed)
Lower extremity venous has been completed.   Preliminary results in CV Proc.   Abram Sander 09/03/2020 1:52 PM

## 2020-09-03 NOTE — Progress Notes (Addendum)
STROKE TEAM PROGRESS NOTE   INTERVAL HISTORY  Great granddaughter at bedside.    Afebrile. VSS.  Neuroexam: patient awake, alert, orientated to age, place, timeand situation. No aphasia, fluent language, following all simple commands, LUE and LLE no drift (4/5). RUE mild drift (4-/5) and RLE (4-/5).    Loop recorder on the day of discharge given concern for cardioembolic source for stroke.  Currently on aspirin and Brilinta for left PCA stenting. PT/OT recommend CIR.  Vitals:   09/03/20 0348 09/03/20 0855 09/03/20 1257 09/03/20 1616  BP:  (!) 162/77 121/72 (!) 145/70  Pulse:  72 83 75  Resp:  18 (!) 23 20  Temp:  97.9 F (36.6 C) 98 F (36.7 C) 98.4 F (36.9 C)  TempSrc:  Oral Oral Oral  SpO2:  97% 97% 96%  Weight: 72.3 kg      CBC:  Recent Labs  Lab 09/01/20 0654 09/01/20 1511 09/02/20 0435 09/02/20 0436 09/03/20 0225  WBC 5.7  --  10.3  --  8.2  NEUTROABS 3.9  --   --   --   --   HGB 14.1   < > 12.5 11.9* 12.6  HCT 42.2   < > 38.4 35.0* 38.2  MCV 93.8  --  95.5  --  94.6  PLT 256  --  240  --  221   < > = values in this interval not displayed.   Basic Metabolic Panel:  Recent Labs  Lab 09/01/20 1330 09/01/20 1511 09/02/20 0435 09/02/20 0436 09/03/20 0225  NA  --    < > 137 139 137  K  --    < > 3.6 3.5 4.0  CL  --   --  108  --  108  CO2  --   --  21*  --  23  GLUCOSE  --   --  116*  --  114*  BUN  --   --  9  --  11  CREATININE  --   --  0.65  --  0.71  CALCIUM  --   --  8.5*  --  8.5*  MG 1.9  --  1.9  --   --   PHOS  --   --  2.6  --   --    < > = values in this interval not displayed.   Lipid Panel:  Recent Labs  Lab 09/02/20 0441  CHOL 188  TRIG 68  HDL 75  CHOLHDL 2.5  VLDL 14  LDLCALC 99   HgbA1c:  Recent Labs  Lab 09/02/20 0435  HGBA1C 5.9*   Urine Drug Screen:  Recent Labs  Lab 09/01/20 0654  LABOPIA NONE DETECTED  COCAINSCRNUR NONE DETECTED  LABBENZ NONE DETECTED  AMPHETMU NONE DETECTED  THCU NONE DETECTED  LABBARB NONE  DETECTED    Alcohol Level No results for input(s): ETH in the last 168 hours.  IMAGING past 24 hours VAS [place] LOWER EXTREMITY VENOUS (DVT)  Result Date: 09/03/2020 Summary: BILATERAL: - No evidence of deep vein thrombosis seen in the lower extremities, bilaterally. - No evidence of superficial venous thrombosis in the lower extremities, bilaterally. -No evidence of popliteal cyst, bilaterally.   *See table(s) above for measurements and observations.    Preliminary    Result date 09/02/2020 MRI/MRA IMPRESSION: 1. Left PCA territory acute ischemia involving the left occipital lobe, medial left temporal lobe and left thalamus. No acute hemorrhage or mass effect. 2. Punctate focus of acute ischemia at the  base of the right precentral gyrus. 3. Severe stenosis of the proximal M2 segment inferior division of the left MCA. 4. Status post stent placement of left PCA P1 and P2 segments with normal distal flow related enhancement.   Result date 09/01/2020 IMPRESSION: 1. Similar suspected small acute infarct in the left PCA territory. No progressive mass effect. MRI could further characterize if clinically indicated. 2. Areas of subtle cortical hyperdensity described on the recent prior have resolved and likely represented contrast staining from recent catheter arteriogram. No evidence of acute hemorrhage.  Result date: 09/01/2020 CT perfusion IMPRESSION: Left PCA penumbra which is smaller than the reported 42 mL mismatch volume as that also includes bilateral cerebellar tissue  Result date: 09/01/2020 CTA head/neck IMPRESSION: 1. Confirmed left PCA occlusion at the left P2 segment with downstream reconstitution followed by a severe stenosis at the left PCA bifurcation. 2. Intracranial atherosclerosis with notable right A2, left M2, and left V4 segment stenoses. 3. Atherosclerosis in the neck without flow limiting stenosis. 4. Left brachiocephalic vein stenosis.  CT head code  stroke Result Date: 09/01/2020 IMPRESSION: 1. Small acute infarct in the left occipital parietal cortex. The adjacent PCA is hyperdense on reformats, suggesting acute thrombosis.  2. No intracranial hemorrhage.   PHYSICAL EXAM General - Well nourished, well developed, in no apparent distress.  Cardiovascular - Regular rate and rhythm.  Mental Status -  Level of arousal and orientation to time, place, and person were intact. Language including expression, naming, repetition, comprehension was assessed and found intact. Attention span and concentration were normal. Recent and remote memory were intact. Fund of Knowledge was assessed and was intact.  Cranial Nerves II - XII - II - Visual field intact,pupils 12mm PERRL III, IV, VI - Extraocular movements intact. V - Facial sensation intact bilaterally. VII - Facial movement intact bilaterally. VIII - Hearing & vestibular intact bilaterally. X - Palate elevates symmetrically. XI -  shoulder shrug intact bilaterally. XII - Tongue protrusion intact.  Motor Strength - The patient's strength LUE and LLE no drift (4/5). RUE mild drift (4-/5) and RLE (4-/5)   Motor Tone - Muscle tone was assessed at the neck and appendages and was normal.  Sensory - Light touch intact symmetrically.  Coordination - RFTN intact but L FTN mild dysmetria  Tremor was absent.  Gait and Station - deferred.   ASSESSMENT/PLAN Ms. Deanna Williams is a 87 y.o. female with history of hypertension, hyperlipidemia, and GERD presenting with weakness. MRI confirmed CVA however etiology remains unknown. Will have loop recorder placed as patient has no known hx of A fib. Patient MRA did not severe stenosis of the proximal M2 of the inferior L MCA. There was normal distal flow of the L PCA s/p stent placement.   Stroke: Left PCA territory acute ischemia due to left P1 occlusion s/p IR and stenting with TICI3. Etiology unknown, concerning for cardioembolic  source.  CT head small acute infarct in the left occipital parietal cortex.   CTA head & neck:  CTA head and neck showed left P1/P2 occlusion.  CT perfusion: left PCA penumbra which is smaller than the reported 42 mL mismatch volume as that also includes bilateral cerebellar tissue.  MRI/MRA: Left PCA territory acute ischemia involving the left occipital lobe, medial left temporal lobe and left thalamus. No acute       hemorrhage or mass effect.  Echo: LVEF 60-65%.   Recommend Loop recorder placement on the day of discharge  HgbA1c 5.9  VTE prophylaxis: Lovenox  LDL 99  No antithrombotic prior to admission, now on aspirin 81 mg daily and Brilinta (ticagrelor) 90 mg bid.   Continue on discharge  Therapy recommendations:   CIR  Disposition:  Pending  Hypertension  Cleviprex off  BP goal < 180/105, gradually normalize in 5-7 days  Long-term BP goal normotensive  Hyperlipidemia   Home meds:  none   LDL 110, goal < 70  lipitor 20 given advanced age  Continue statin on discharge  Other Stroke Risk Factors  Advanced age  Hospital day # 2  ATTENDING NOTE: I reviewed above note and agree with the assessment and plan. Pt was seen and examined.   On exam, great granddaughter at bedside, patient awake alert, lying in bed, mildly lethargic.  Still has mild right hemiparesis.  PT/OT recommend CIR, currently pending insurance approval and bed availability.  On aspirin and Brilinta as well as Lipitor 20.  Pending loop recorder on the day of discharge.  For detailed assessment and plan, please refer to above as I have made changes wherever appropriate.   Rosalin Hawking, MD PhD Stroke Neurology 09/03/2020 10:29 PM     To contact Stroke Continuity provider, please refer to [email or web address] After hours, contact General Neurology

## 2020-09-04 ENCOUNTER — Encounter (HOSPITAL_COMMUNITY)
Admission: EM | Disposition: A | Discharge status: Rehabilitation Facility | Payer: Self-pay | Source: Other Acute Inpatient Hospital | Attending: Neurology

## 2020-09-04 DIAGNOSIS — E7800 Pure hypercholesterolemia, unspecified: Secondary | ICD-10-CM

## 2020-09-04 DIAGNOSIS — I480 Paroxysmal atrial fibrillation: Secondary | ICD-10-CM

## 2020-09-04 DIAGNOSIS — I6389 Other cerebral infarction: Secondary | ICD-10-CM

## 2020-09-04 LAB — GLUCOSE, CAPILLARY
Glucose-Capillary: 119 mg/dL — ABNORMAL HIGH (ref 70–99)
Glucose-Capillary: 109 mg/dL — ABNORMAL HIGH (ref 70–99)
Glucose-Capillary: 151 mg/dL — ABNORMAL HIGH (ref 70–99)

## 2020-09-04 LAB — CBC
HCT: 38.5 % (ref 36.0–46.0)
Hemoglobin: 12.7 g/dL (ref 12.0–15.0)
MCH: 31.3 pg (ref 26.0–34.0)
MCHC: 33 g/dL (ref 30.0–36.0)
MCV: 94.8 fL (ref 80.0–100.0)
Platelets: 221 10*3/uL (ref 150–400)
RBC: 4.06 MIL/uL (ref 3.87–5.11)
RDW: 12.3 % (ref 11.5–15.5)
WBC: 6.7 10*3/uL (ref 4.0–10.5)
nRBC: 0 % (ref 0.0–0.2)

## 2020-09-04 LAB — BASIC METABOLIC PANEL
Anion gap: 8 (ref 5–15)
BUN: 11 mg/dL (ref 8–23)
CO2: 22 mmol/L (ref 22–32)
Calcium: 8.7 mg/dL — ABNORMAL LOW (ref 8.9–10.3)
Chloride: 105 mmol/L (ref 98–111)
Creatinine, Ser: 0.71 mg/dL (ref 0.44–1.00)
GFR, Estimated: >60 mL/min (ref >60)
Glucose, Bld: 119 mg/dL — ABNORMAL HIGH (ref 70–99)
Potassium: 3.9 mmol/L (ref 3.5–5.1)
Sodium: 135 mmol/L (ref 135–145)

## 2020-09-04 LAB — MAGNESIUM: Magnesium: 2.1 mg/dL (ref 1.7–2.4)

## 2020-09-04 SURGERY — LOOP RECORDER INSERTION
Anesthesia: LOCAL

## 2020-09-04 MED ORDER — APIXABAN 5 MG PO TABS
5.0000 mg | ORAL_TABLET | Freq: Two times a day (BID) | ORAL | Status: DC
  Administered 2020-09-04 (×2): 5 mg via ORAL
  Filled 2020-09-04 (×3): qty 1

## 2020-09-04 MED ORDER — ENSURE ENLIVE PO LIQD
237.0000 mL | ORAL | Status: DC
  Administered 2020-09-04: 237 mL via ORAL

## 2020-09-04 NOTE — Progress Notes (Addendum)
Inpatient Rehab Admissions Coordinator:    I have insurance authorization for CIR, but do not have a bed for this patient today. Family updated. I will continue to follow for potential admit pending bed availability.  I confirmed with grand-daughter in law, Nera Haworth, that she plans to come and stay with Pt. In her apartment and provide 24/7 assistance.   Clemens Catholic, [place] Admissions Coordinator  [phone] ([place]) [phone] (office)

## 2020-09-04 NOTE — Progress Notes (Signed)
Occupational Therapy Treatment Patient Details Name: Deanna Williams MRN: [id number] DOB: 25-Jan-1929 Today's Date: 09/04/2020    History of present illness 87 y.o. female admitted 4/19 with Rt sided weakness.  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  She underwent placement of Lt PCA stent.  PMH includes: palpatations, osteopenia, Lichen sclerousus et atrophicus, HTN   OT comments  Pt making steady progress towards OT goals this session. Session conducted in conjunction with PT to maximize pts activity tolerance. Pt continues to present with decreased activity tolerance, impaired balance and generalized deconditioning. Pt currently requires supervision- min guard assist for standing grooming tasks, MINA for LB ADLs and MIN A for UB ADLs. Pt would continue to benefit from skilled occupational therapy while admitted and after d/c to address the below listed limitations in order to improve overall functional mobility and facilitate independence with BADL participation. DC plan remains appropriate, will follow acutely per POC.     Follow Up Recommendations  CIR    Equipment Recommendations  None recommended by OT    Recommendations for Other Services      Precautions / Restrictions Precautions Precautions: Fall Precaution Comments: Rt lateral lean Restrictions Weight Bearing Restrictions: No       Mobility Bed Mobility Overal bed mobility: Needs Assistance Bed Mobility: Supine to Sit;Sit to Supine     Supine to sit: Supervision;HOB elevated Sit to supine: Supervision;HOB elevated   General bed mobility comments: supervision for safety    Transfers Overall transfer level: Needs assistance Equipment used: Rolling walker (2 wheeled) Transfers: Sit to/from Stand Sit to Stand: Min guard;+2 safety/equipment          General transfer comment: minguard +2 for safety to rise from EOB and recliner, good hand placement noted    Balance Overall balance assessment: Needs assistance Sitting-balance support: Feet supported Sitting balance-Leahy Scale: Fair Sitting balance - Comments: completed LB ADLs from EOB with no LOB   Standing balance support: No upper extremity supported;During functional activity Standing balance-Leahy Scale: Fair Standing balance comment: completing ADls at sink with no UE support and no LOB                           ADL either performed or assessed with clinical judgement   ADL Overall ADL's : Needs assistance/impaired     Grooming: Wash/dry face;Oral care;Wash/dry hands;Brushing hair;Standing;Supervision/safety;Min guard Grooming Details (indicate cue type and reason): standign at sink with supervision- min guard assist         Upper Body Dressing : Minimal assistance;Sitting Upper Body Dressing Details (indicate cue type and reason): to don posterior gown Lower Body Dressing: Minimal assistance;Sit to/from stand;Supervision/safety;Set up Lower Body Dressing Details (indicate cue type and reason): pt requires assist to don R sock but able to don shoes and doff socks from supervision- set- up assist. MINA  to don breif needing assist to pull brief up to waist line Toilet Transfer: Min guard;RW;Ambulation;+2 for safety/equipment Toilet Transfer Details (indicate cue type and reason): simulated via functional mobility with Rw and MIN guard assist +2 for safety         Functional mobility during ADLs: Min guard;+2 for safety/equipment;Rolling walker General ADL Comments: pt continues to present with decreased activity tolerance, impaired balance, and generalized deconditioning     Vision  Perception     Praxis      Cognition Arousal/Alertness: Awake/alert Behavior During Therapy: WFL for tasks assessed/performed Overall Cognitive Status:  Impaired/Different from baseline Area of Impairment: Attention;Following commands;Awareness;Problem solving;Orientation                 Orientation Level: Disoriented to;Place (asking COTA if she is from [place]( with pt thinking she was in the hospital [place])) Current Attention Level: Selective   Following Commands: Follows one step commands consistently;Follows multi-step commands with increased time   Awareness: Emergent Problem Solving: Slow processing;Difficulty sequencing;Requires verbal cues;Requires tactile cues General Comments: pt slightly disoriented to location thinking we were in [facility]        Exercises     Shoulder Instructions       General Comments      Pertinent Vitals/ Pain       Pain Assessment: No/denies pain  Home Living                                          Prior Functioning/Environment              Frequency  Min 2X/week        Progress Toward Goals  OT Goals(current goals can now be found in the care plan section)  Progress towards OT goals: Progressing toward goals  Acute Rehab OT Goals Patient Stated Goal: to go CIR OT Goal Formulation: With patient/family Time For Goal Achievement: 09/16/20 Potential to Achieve Goals: Good  Plan Discharge plan remains appropriate;Frequency remains appropriate    Co-evaluation      Reason for Co-Treatment: For patient/therapist safety;To address functional/ADL transfers          AM-PAC OT "6 Clicks" Daily Activity     Outcome Measure   Help from another person eating meals?: None Help from another person taking care of personal grooming?: A Little Help from another person toileting, which includes using toliet, bedpan, or urinal?: A Little Help from another person bathing (including washing, rinsing, drying)?: A Little Help from another person to put on and taking off regular upper body clothing?: None Help from another person to put on and taking off  regular lower body clothing?: A Little 6 Click Score: 20    End of Session Equipment Utilized During Treatment: Gait belt;Rolling walker  OT Visit Diagnosis: Unsteadiness on feet (R26.81)   Activity Tolerance Patient tolerated treatment well   Patient Left in bed;with call bell/phone within reach   Nurse Communication Mobility status        Time: 3546-5681 OT Time Calculation (min): 28 min  Charges: OT General Charges $OT Visit: 1 Visit OT Treatments $Self Care/Home Management : 8-22 mins  Harley Alto., COTA/L Acute Rehabilitation Services [phone]    Precious Haws 09/04/2020, 4:14 PM

## 2020-09-04 NOTE — Progress Notes (Signed)
Inpatient Rehab Admissions Coordinator:    I have a CIR bed for this Pt. For tomorrow Saturday 09/05/20. RN may call report to [phone] after 12 pm tomorrow.   Clemens Catholic, [place], [place] Admissions Coordinator  [phone] ([place]) [phone] (office)

## 2020-09-04 NOTE — Progress Notes (Signed)
ANTICOAGULATION CONSULT NOTE - Initial Consult  Pharmacy Consult for Apixaban Indication: nonvalvular atrial fibrillation   No Known Allergies  Patient Measurements: Weight: 67.4 kg (148 lb 9.4 oz) Height 5 feet, 3 in. (63 in)  Vital Signs: Temp: 98.2 F (36.8 C) (04/22 1300) Temp Source: Oral (04/22 1300) BP: 127/70 (04/22 1300) Pulse Rate: 79 (04/22 1300)  Labs: Recent Labs    09/02/20 0435 09/02/20 0436 09/03/20 0225 09/04/20 0556  HGB 12.5 11.9* 12.6 12.7  HCT 38.4 35.0* 38.2 38.5  PLT 240  --  221 221  CREATININE 0.65  --  0.71 0.71    Estimated Creatinine Clearance: 44 mL/min (by C-G formula based on SCr of 0.71 mg/dL).   Medical History: Past Medical History:  Diagnosis Date  . Gastroesophageal reflux disease   . Hyperlipidemia   . Hypertension   . Lichen sclerosus   . Lichen sclerosus et atrophicus   . Osteopenia   . Palpitations     Medications:  Medications Prior to Admission  Medication Sig Dispense Refill Last Dose  . conjugated estrogens (PREMARIN) vaginal cream Place 1 Applicatorful vaginally daily. (Patient not taking: Reported on 09/01/2020) 42.5 g 12 Not Taking at Unknown time  . omeprazole (PRILOSEC) 20 MG capsule Take 1 capsule (20 mg total) by mouth daily. (Patient not taking: Reported on 09/01/2020) 30 capsule 3 Not Taking at Unknown time  . terconazole (TERAZOL 3) 0.8 % vaginal cream Place 1 applicator vaginally at bedtime. (Patient not taking: No sig reported) 20 g 3 Not Taking at Unknown time   Scheduled:  . amLODipine  10 mg Oral Daily  . aspirin  81 mg Oral Daily   Or  . aspirin  81 mg Per Tube Daily  . atorvastatin  20 mg Oral Daily  . Chlorhexidine Gluconate Cloth  6 each Topical Daily  . enoxaparin (LOVENOX) injection  40 mg Subcutaneous Q24H  . ticagrelor  90 mg Oral BID   Or  . ticagrelor  90 mg Per Tube BID    Assessment: Pharmacy consulted to start Apixaban in this 87 y.o female for  New nonvalvular AFib who was  admitted on 09/01/20 for acute CVA.  CHA2DS2VASC of at least 5.   87 y.o , wt >60kg,  Scr <1.5 thus appropriate Apixaban dose is 5 mg BID. H/H  wnl, Plt wnl    BLE venous duplex 4/21: negative   CT head: acute CVA,  no intracranial    Goal of Therapy:  Stroke prevention Monitor platelets by anticoagulation protocol: Yes   Plan:  Apixaban 5 mg BID Monitor renal function and for s/sx of bleeding  Thank you for allowing pharmacy to be part of this patients care team.  Nicole Cella, [place] Pharmacist [phone] Please check AMION for all [facility] phone numbers After 10:00 PM, call [place] [phone] 09/04/2020,2:08 PM

## 2020-09-04 NOTE — Care Management Important Message (Signed)
Important Message  Patient Details  Name: Deanna Williams MRN: [id number] Date of Birth: [birth year]   Medicare Important Message Given:  Yes     Orbie Pyo 09/04/2020, 2:14 PM

## 2020-09-04 NOTE — H&P (Signed)
Physical Medicine and Rehabilitation Admission H&P    Chief Complaint  Patient presents with  . Functional deficits due to stroke    HPI: Deanna Williams is an 87 year old female with history of HTN, GERD, Lichen sclerosus who was admitted on 09/01/20 with right sided weakness and numbness. She was found to have left occipital parietal cortex infarct with hyperdense signal suggesting acute thrombosis. She underwent cerebral angio with thrombectomy and rescue stent for reocclusion by Dr. Estanislado Pandy. Follow up MRI brain showed L-PCA ischemia involving occipital, medial temporal and thalamus. 2 D echo showed EF 60-65% with no wall abnormality.Dr. Erlinda Hong recommended Loop placement due to concerns of embolic source of unknown source and she was found to have episode of A fib on 04/22. Eliquis recommended by Dr. Curt Bears with plans to follow up in office. She continues to have balance deficits, difficulty with higher level cognitive tasks as well as  weakness affecting ADLs and mobility. CIR recommended due to functional decline.    Review of Systems  Constitutional: Negative for chills and fever.  HENT: Negative for hearing loss and tinnitus.   Eyes: Negative for blurred vision and double vision.  Respiratory: Negative for cough and shortness of breath.   Gastrointestinal: Positive for constipation. Negative for heartburn and nausea.  Genitourinary: Negative for dysuria and urgency.  Musculoskeletal: Negative for myalgias and neck pain.  Skin: Negative for rash.  Neurological: Positive for sensory change and focal weakness.  Psychiatric/Behavioral: The patient is not nervous/anxious and does not have insomnia.       Past Medical History:  Diagnosis Date  . Gastroesophageal reflux disease   . Hyperlipidemia   . Hypertension   . Lichen sclerosus   . Lichen sclerosus et atrophicus   . Osteopenia   . Palpitations     Past Surgical History:  Procedure Laterality Date  . CARPAL TUNNEL  RELEASE    . FOOT SURGERY    . HYSTEROSCOPY WITH D & C N/A 11/02/2015   Procedure: DILATATION AND CURETTAGE /HYSTEROSCOPY;  Surgeon: Brayton Mars, MD;  Location: [facility];  Service: Gynecology;  Laterality: N/A;  . IR CT HEAD LTD  09/01/2020  . IR INTRA CRAN STENT  09/01/2020  . IR PERCUTANEOUS ART THROMBECTOMY/INFUSION INTRACRANIAL INC DIAG ANGIO  09/01/2020  . RADIOLOGY WITH ANESTHESIA N/A 09/01/2020   Procedure: IR WITH ANESTHESIA;  Surgeon: Radiologist, Medication, MD;  Location: [facility];  Service: Radiology;  Laterality: N/A;    Family History  Problem Relation Age of Onset  . Cancer Mother   . Leukemia Mother   . Cancer Father        colon  . Hypertension Father   . Arthritis Sister   . Breast cancer Neg Hx     Social History:  Widowed. Used to work I a [employer]. Lives in [place] and active PTA. She reports that she has never smoked. She has never used smokeless tobacco. She reports current alcohol use--mixed drink occasionally. She reports that she does not use drugs.   Allergies: No Known Allergies    Medications Prior to Admission  Medication Sig Dispense Refill  . conjugated estrogens (PREMARIN) vaginal cream Place 1 Applicatorful vaginally daily. (Patient not taking: Reported on 09/01/2020) 42.5 g 12  . omeprazole (PRILOSEC) 20 MG capsule Take 1 capsule (20 mg total) by mouth daily. (Patient not taking: Reported on 09/01/2020) 30 capsule 3  . terconazole (TERAZOL 3) 0.8 % vaginal cream Place 1 applicator vaginally at bedtime. (Patient  not taking: No sig reported) 20 g 3    Drug Regimen Review  Drug regimen was reviewed and remains appropriate with no significant issues identified  Home: Home Living Family/patient expects to be discharged to:: Private residence Living Arrangements: Alone Available Help at Discharge: Family Type of Home: Independent living facility Home Access: Level entry Home Layout: One level Bathroom Shower/Tub: Multimedia programmer:  Handicapped height Home Equipment: Grab bars - toilet,Grab bars - tub/shower,Hand held shower head,None Additional Comments: Pt lives in a retirement community with her yorkie  Lives With: Alone   Functional History: Prior Function Level of Independence: Independent Comments: drives, grocery shops, cooks, walks her dog  Functional Status:  Mobility: Bed Mobility Overal bed mobility: Needs Assistance Bed Mobility: Supine to Sit,Sit to Supine Supine to sit: Supervision,HOB elevated Sit to supine: Supervision,HOB elevated General bed mobility comments: supervision for safety Transfers Overall transfer level: Needs assistance Equipment used: Rolling walker (2 wheeled) Transfers: Sit to/from Stand Sit to Stand: Min guard,+2 safety/equipment Stand pivot transfers: Mod assist,+2 physical assistance,+2 safety/equipment General transfer comment: minguard +2 for safety to rise from EOB and recliner, good hand placement noted Ambulation/Gait Ambulation/Gait assistance: +2 safety/equipment,Mod assist Gait Distance (Feet): 40 Feet Assistive device: Rolling walker (2 wheeled) Gait Pattern/deviations: Step-through pattern,Decreased step length - right,Decreased step length - left,Decreased stride length,Decreased weight shift to right,Decreased weight shift to left,Decreased dorsiflexion - right,Trunk flexed General Gait Details: noted R ankle instability with ambulation requiring blocking to prevent inversion. Leans R at times with progression. Cues for increasing step length on the R Gait velocity: decreased Gait velocity interpretation: <1.31 ft/sec, indicative of household ambulator    ADL: ADL Overall ADL's : Needs assistance/impaired Eating/Feeding: Modified independent,Sitting,Bed level Grooming: Wash/dry face,Oral care,Wash/dry hands,Brushing hair,Standing,Supervision/safety,Min guard Grooming Details (indicate cue type and reason): standign at sink with supervision- min guard  assist Upper Body Bathing: Minimal assistance,Sitting Upper Body Bathing Details (indicate cue type and reason): for sitting balance Lower Body Bathing: Moderate assistance,Sit to/from stand Upper Body Dressing : Minimal assistance,Sitting Upper Body Dressing Details (indicate cue type and reason): to don posterior gown Lower Body Dressing: Minimal assistance,Sit to/from stand,Supervision/safety,Set up Lower Body Dressing Details (indicate cue type and reason): pt requires assist to don R sock but able to don shoes and doff socks from supervision- set- up assist. MINA  to don breif needing assist to pull brief up to waist line Toilet Transfer: Min guard,RW,Ambulation,+2 for safety/equipment Toilet Transfer Details (indicate cue type and reason): simulated via functional mobility with Rw and MIN guard assist +2 for safety Toileting- Clothing Manipulation and Hygiene: Moderate assistance,+2 for physical assistance,+2 for safety/equipment,Sit to/from stand Toileting - Clothing Manipulation Details (indicate cue type and reason): Pt leans heavily to the Rt Functional mobility during ADLs: Min guard,+2 for safety/equipment,Rolling walker General ADL Comments: pt continues to present with decreased activity tolerance, impaired balance, and generalized deconditioning  Cognition: Cognition Overall Cognitive Status: Impaired/Different from baseline Arousal/Alertness: Awake/alert Orientation Level: Oriented X4 Attention: Focused,Sustained,Alternating Focused Attention: Appears intact Sustained Attention: Impaired Sustained Attention Impairment: Verbal complex,Functional complex Alternating Attention: Impaired Alternating Attention Impairment: Verbal complex,Functional complex Memory: Impaired Memory Impairment: Decreased recall of new information,Decreased short term memory Decreased Short Term Memory: Verbal complex Awareness: Impaired Problem Solving: Impaired Problem Solving Impairment:  Verbal complex,Functional complex Executive Function: Organizing,Self Monitoring Organizing: Impaired Organizing Impairment: Verbal complex,Functional complex Self Monitoring: Impaired Self Monitoring Impairment: Verbal complex,Functional complex Safety/Judgment: Impaired Cognition Arousal/Alertness: Awake/alert Behavior During Therapy: WFL for tasks assessed/performed Overall Cognitive Status: Impaired/Different from  baseline Area of Impairment: Attention,Following commands,Awareness,Problem solving,Orientation Orientation Level: Disoriented to,Place (asking COTA if she is from [place]( with pt thinking she was in the hospital [place])) Current Attention Level: Selective Following Commands: Follows one step commands consistently,Follows multi-step commands with increased time Awareness: Emergent Problem Solving: Slow processing,Difficulty sequencing,Requires verbal cues,Requires tactile cues General Comments: pt slightly disoriented to location thinking we were in [place]  Physical Exam: Blood pressure 127/70, pulse 79, temperature 98.2 F (36.8 C), temperature source Oral, resp. rate 19, weight 67.4 kg, SpO2 97 %. Physical Exam Vitals and nursing note reviewed.  Constitutional:      Appearance: Normal appearance.  HENT:     Head: Normocephalic and atraumatic.     Right Ear: External ear normal.     Left Ear: External ear normal.     Nose: Nose normal.     Mouth/Throat:     Mouth: Mucous membranes are moist.     Pharynx: Oropharynx is clear.  Eyes:     Extraocular Movements: Extraocular movements intact.     Pupils: Pupils are equal, round, and reactive to light.  Cardiovascular:     Rate and Rhythm: Normal rate and regular rhythm.     Pulses: Normal pulses.     Heart sounds: No murmur heard. No gallop.   Pulmonary:     Effort: Pulmonary effort is normal. No respiratory distress.     Breath sounds: No wheezing.  Abdominal:     General: Bowel sounds are normal. There is  no distension.     Palpations: Abdomen is soft.     Tenderness: There is no abdominal tenderness.  Musculoskeletal:        General: Swelling (LUE) present.     Cervical back: Normal range of motion.  Skin:    General: Skin is warm.     Findings: Bruising present.  Neurological:     Mental Status: She is alert and oriented to person, place, and time.     Comments: Speech clear and follows simple motor commands without difficulty. No focal CN abnl.  RUE 4/5. LUE 4+/5. RLE 3/5 prox to 4/5 distally.  LLE 3+/5 prox to 4+/5 distally. Stocking glove sensory loss from knees to feet, also hands. Feet most affected. DTR's 1+. No gross limb ataxia. No resting tone.  Psychiatric:        Mood and Affect: Mood normal.        Behavior: Behavior normal.        Thought Content: Thought content normal.     Results for orders placed or performed during the hospital encounter of 09/01/20 (from the past 48 hour(s))  Basic metabolic panel     Status: Abnormal   Collection Time: 09/03/20  2:25 AM  Result Value Ref Range   Sodium 137 135 - 145 mmol/L   Potassium 4.0 3.5 - 5.1 mmol/L   Chloride 108 98 - 111 mmol/L   CO2 23 22 - 32 mmol/L   Glucose, Bld 114 (H) 70 - 99 mg/dL    Comment: Glucose reference range applies only to samples taken after fasting for at least 8 hours.   BUN 11 8 - 23 mg/dL   Creatinine, Ser 0.71 0.44 - 1.00 mg/dL   Calcium 8.5 (L) 8.9 - 10.3 mg/dL   GFR, Estimated >60 >60 mL/min    Comment: (NOTE) Calculated using the CKD-EPI Creatinine Equation (2021)    Anion gap 6 5 - 15    Comment: Performed at [facility], [address] [place], [place] [address]  CBC     Status: None   Collection Time: 09/03/20  2:25 AM  Result Value Ref Range   WBC 8.2 4.0 - 10.5 K/uL   RBC 4.04 3.87 - 5.11 MIL/uL   Hemoglobin 12.6 12.0 - 15.0 g/dL   HCT 38.2 36.0 - 46.0 %   MCV 94.6 80.0 - 100.0 fL   MCH 31.2 26.0 - 34.0 pg   MCHC 33.0 30.0 - 36.0 g/dL   RDW 12.5 11.5 - 15.5 %    Platelets 221 150 - 400 K/uL   nRBC 0.0 0.0 - 0.2 %    Comment: Performed at [facility], [place] [address] [place], [place] [address]  Glucose, capillary     Status: Abnormal   Collection Time: 09/03/20  1:00 PM  Result Value Ref Range   Glucose-Capillary 133 (H) 70 - 99 mg/dL    Comment: Glucose reference range applies only to samples taken after fasting for at least 8 hours.  Glucose, capillary     Status: Abnormal   Collection Time: 09/03/20  4:19 PM  Result Value Ref Range   Glucose-Capillary 109 (H) 70 - 99 mg/dL    Comment: Glucose reference range applies only to samples taken after fasting for at least 8 hours.   Comment 1 Notify RN    Comment 2 Document in Chart   Glucose, capillary     Status: Abnormal   Collection Time: 09/04/20  3:17 AM  Result Value Ref Range   Glucose-Capillary 119 (H) 70 - 99 mg/dL    Comment: Glucose reference range applies only to samples taken after fasting for at least 8 hours.  Basic metabolic panel     Status: Abnormal   Collection Time: 09/04/20  5:56 AM  Result Value Ref Range   Sodium 135 135 - 145 mmol/L   Potassium 3.9 3.5 - 5.1 mmol/L   Chloride 105 98 - 111 mmol/L   CO2 22 22 - 32 mmol/L   Glucose, Bld 119 (H) 70 - 99 mg/dL    Comment: Glucose reference range applies only to samples taken after fasting for at least 8 hours.   BUN 11 8 - 23 mg/dL   Creatinine, Ser 0.71 0.44 - 1.00 mg/dL   Calcium 8.7 (L) 8.9 - 10.3 mg/dL   GFR, Estimated >60 >60 mL/min    Comment: (NOTE) Calculated using the CKD-EPI Creatinine Equation (2021)    Anion gap 8 5 - 15    Comment: Performed at [facility] [address], [place], [place] [address]  CBC     Status: None   Collection Time: 09/04/20  5:56 AM  Result Value Ref Range   WBC 6.7 4.0 - 10.5 K/uL   RBC 4.06 3.87 - 5.11 MIL/uL   Hemoglobin 12.7 12.0 - 15.0 g/dL   HCT 38.5 36.0 - 46.0 %   MCV 94.8 80.0 - 100.0 fL   MCH 31.3 26.0 - 34.0 pg   MCHC 33.0 30.0 - 36.0 g/dL   RDW 12.3  11.5 - 15.5 %   Platelets 221 150 - 400 K/uL   nRBC 0.0 0.0 - 0.2 %    Comment: Performed at [facility], [place] [address] [place], [place] [address]  Magnesium     Status: None   Collection Time: 09/04/20  5:56 AM  Result Value Ref Range   Magnesium 2.1 1.7 - 2.4 mg/dL    Comment: Performed at [facility], [place] [address] [place], [place] [address]  Glucose, capillary     Status: Abnormal   Collection Time: 09/04/20  1:03 PM  Result Value Ref Range   Glucose-Capillary 109 (H) 70 - 99 mg/dL    Comment: Glucose reference range applies only to samples taken after fasting for at least 8 hours.   Comment 1 Notify RN    Comment 2 Document in Chart    VAS [place] LOWER EXTREMITY VENOUS (DVT)  Result Date: 09/03/2020  Lower Venous DVT Study Indications: Pain.  Limitations: Bandages. Comparison Study: no prior Performing Technologist: Abram Sander RVS  Examination Guidelines: A complete evaluation includes B-mode imaging, spectral Doppler, color Doppler, and power Doppler as needed of all accessible portions of each vessel. Bilateral testing is considered an integral part of a complete examination. Limited examinations for reoccurring indications may be performed as noted. The reflux portion of the exam is performed with the patient in reverse Trendelenburg.  +---------+---------------+---------+-----------+----------+--------------+ RIGHT    CompressibilityPhasicitySpontaneityPropertiesThrombus Aging +---------+---------------+---------+-----------+----------+--------------+ CFV                                                   bandages       +---------+---------------+---------+-----------+----------+--------------+ SFJ                                                   bandages       +---------+---------------+---------+-----------+----------+--------------+ FV Prox                                               bandages        +---------+---------------+---------+-----------+----------+--------------+ FV Mid   Full                                                        +---------+---------------+---------+-----------+----------+--------------+ FV DistalFull                                                        +---------+---------------+---------+-----------+----------+--------------+ PFV      Full                                                        +---------+---------------+---------+-----------+----------+--------------+ POP      Full           Yes      Yes                                 +---------+---------------+---------+-----------+----------+--------------+ PTV      Full                                                        +---------+---------------+---------+-----------+----------+--------------+  PERO     Full                                                        +---------+---------------+---------+-----------+----------+--------------+   +---------+---------------+---------+-----------+----------+--------------+ LEFT     CompressibilityPhasicitySpontaneityPropertiesThrombus Aging +---------+---------------+---------+-----------+----------+--------------+ CFV      Full           Yes      Yes                                 +---------+---------------+---------+-----------+----------+--------------+ SFJ      Full                                                        +---------+---------------+---------+-----------+----------+--------------+ FV Prox  Full                                                        +---------+---------------+---------+-----------+----------+--------------+ FV Mid   Full                                                        +---------+---------------+---------+-----------+----------+--------------+ FV DistalFull                                                         +---------+---------------+---------+-----------+----------+--------------+ PFV      Full                                                        +---------+---------------+---------+-----------+----------+--------------+ POP      Full           Yes      Yes                                 +---------+---------------+---------+-----------+----------+--------------+ PTV      Full                                                        +---------+---------------+---------+-----------+----------+--------------+ PERO     Full                                                        +---------+---------------+---------+-----------+----------+--------------+  Summary: BILATERAL: - No evidence of deep vein thrombosis seen in the lower extremities, bilaterally. - No evidence of superficial venous thrombosis in the lower extremities, bilaterally. -No evidence of popliteal cyst, bilaterally.   *See table(s) above for measurements and observations. Electronically signed by Harold Barban MD on 09/03/2020 at 9:26:38 PM.    Final        Medical Problem List and Plan: 1.  Functional deficits and mild right hemiparesis secondary to left PCA infarct  -patient may shower  -ELOS/Goals: 7-10 days, supervision to mod I with PT and OT 2.  Antithrombotics: -DVT/anticoagulation:  Pharmaceutical: Other (comment)--Eliquis  -antiplatelet therapy: ASA/Brilinta 3. Pain Management: N/A 4. Mood: LCSW to follow for evaluation and support.   -antipsychotic agents: N/A 5. Neuropsych: This patient is capable of making decisions on her own behalf. 6. Skin/Wound Care: Routine pressure relief measures 7. Fluids/Electrolytes/Nutrition: Monitor I/O. Check lytes in am. 8. HTN: Monitor BP TID--continue Norvasc daily 9. A fib: New diagnosis--monitor HR TID--now on Eliquis.   -HR controlled, regular on admit 10. Prediabetes: Hgb A1C-5.9. Add CM restrictions and educate patient on CM diet.  11. L-PCA stent: On  ASA, Brilinta and Lipitor.         Bary Leriche, PA-C 09/04/2020

## 2020-09-04 NOTE — Progress Notes (Addendum)
Nutrition Follow-up  DOCUMENTATION CODES:   Not applicable  INTERVENTION:  Heart Healthy diet   Ensure Enlive daily  Recommend consider bowel regimen. Last BM prior to admission.  NUTRITION DIAGNOSIS:   Inadequate oral intake related to acute illness (stroke) as evidenced by patient unable to consume food orally due to intubation. Diet now fully advanced.  -progressing with full diet advancement  GOAL:   Patient will meet greater than or equal to 90% of their needs   MONITOR:   PO intake,Supplement acceptance,Weight trends,Labs,I & O's   ASSESSMENT:  RD working remotely  Pt with PMH of HTN, GERD, and HLD admitted with R-sided weakness per CT pt with L PCA territory stroke s/p thrombectomy with stent.   Patient extubated and diet advanced. Procedure that had been scheduled for insertion of loop recorder was cancelled per patient. New finding of A fib.   SLP evaluated 4/20 -cognitive communication deficit.  Spoke with nursing and patient by telephone. Patients reports good appetite. Meal intake 100%. Modified independent with feeding per PT.   Plans for discharge to IP rehab.   Weight 68 kg on admission. Current 67.4 kg.   Diet Order:   Diet Order            Diet Heart Room service appropriate? Yes; Fluid consistency: Thin  Diet effective now                 EDUCATION NEEDS:   No education needs have been identified at this time  Skin:  Skin Assessment: Reviewed RN Assessment  Last BM:  unknown   Height:   Ht Readings from Last 1 Encounters:  09/01/20 5\' 3"  (1.6 m)    Weight:   Wt Readings from Last 1 Encounters:  09/04/20 67.4 kg    Ideal Body Weight:  52.2 kg  BMI:  Body mass index is 26.32 kg/m.  Estimated Nutritional Needs:   Kcal:  1829-9371  Protein:  87-94 gr  Fluid:  >1600 ml daily  Colman Cater MS,RD,CSG,LDN Contact: [email or web address]

## 2020-09-04 NOTE — Progress Notes (Addendum)
STROKE TEAM PROGRESS NOTE   INTERVAL HISTORY Her daughter and son are at the bedside.  Patient is up to chair today and is pleasant and communicates well.  Peer to peer this AM with Dr. Amalia Hailey from patient's insurance company. After discussion he will approve patient for CIR. Patient was also noted to have A fib on tele this AM. EP was already coming to see the patient for possible loop recorder placement; however given new finding they recommend that patient must be started on Eliquis. Patient's son and daughter were in agreement.   Vitals:   09/04/20 0316 09/04/20 0500 09/04/20 0823 09/04/20 1300  BP: (!) 146/110  (!) 151/64 127/70  Pulse: 82  76 79  Resp:    19  Temp: 98 F (36.7 C)  98.2 F (36.8 C) 98.2 F (36.8 C)  TempSrc: Oral  Oral Oral  SpO2: 98%  97% 97%  Weight:  67.4 kg     CBC:  Recent Labs  Lab 09/01/20 0654 09/01/20 1511 09/03/20 0225 09/04/20 0556  WBC 5.7   < > 8.2 6.7  NEUTROABS 3.9  --   --   --   HGB 14.1   < > 12.6 12.7  HCT 42.2   < > 38.2 38.5  MCV 93.8   < > 94.6 94.8  PLT 256   < > 221 221   < > = values in this interval not displayed.   Basic Metabolic Panel:  Recent Labs  Lab 09/02/20 0435 09/02/20 0436 09/03/20 0225 09/04/20 0556  NA 137   < > 137 135  K 3.6   < > 4.0 3.9  CL 108  --  108 105  CO2 21*  --  23 22  GLUCOSE 116*  --  114* 119*  BUN 9  --  11 11  CREATININE 0.65  --  0.71 0.71  CALCIUM 8.5*  --  8.5* 8.7*  MG 1.9  --   --  2.1  PHOS 2.6  --   --   --    < > = values in this interval not displayed.   Lipid Panel:  Recent Labs  Lab 09/02/20 0441  CHOL 188  TRIG 68  HDL 75  CHOLHDL 2.5  VLDL 14  LDLCALC 99   HgbA1c:  Recent Labs  Lab 09/02/20 0435  HGBA1C 5.9*   Urine Drug Screen:  Recent Labs  Lab 09/01/20 0654  LABOPIA NONE DETECTED  COCAINSCRNUR NONE DETECTED  LABBENZ NONE DETECTED  AMPHETMU NONE DETECTED  THCU NONE DETECTED  LABBARB NONE DETECTED    Alcohol Level No results for input(s): ETH in  the last 168 hours.  IMAGING past 24 hours No results found.  PHYSICAL EXAM General- Well nourished, well developed, in no apparent distress.  Cardiovascular - Regular rate and rhythm.  Extremities: Ecchymosis noted from CBG fingerstick's in the fingers.   Mental Status-  Level of arousal and orientation to time, place, and person were intact.Patient up in chair eating food brought by family. Language including expression, naming, repetition, comprehension was assessed and found intact. Attention span and concentration were normal. Recent and remote memory were intact. Fund of Knowledge was assessed and was intact.  Cranial Nerves II - XII- II - Visual field intact,pupils 44mm PERRL III, IV, VI - Extraocular movements intact. V - Facial sensation intact bilaterally. VII - Facial movement intact bilaterally. VIII - Hearing & vestibular intact bilaterally. X - Palate elevates symmetrically. XI - shoulder shrug intact bilaterally.  XII - Tongue protrusion intact.  Motor Strength -The patient's strength LUE and LLE no drift (4/5). RUE (4-/5) and RLE (3-/5)with mild drift   Motor Tone- Muscle tone was assessed at the neck and appendages and was normal.  Sensory-Light touch intact symmetrically.  Coordination-FTN intact bilaterally. Tremor was absent.  Gait and Station-deferred.  ASSESSMENT/PLAN Deanna Williams a 87 y.o.femalewith history of hypertension, hyperlipidemia, and GERDpresenting withweakness.MRI confirmed CVA however etiology remains unknown. Will have loop recorder placed as patient has no known hx of A fib. Patient MRA did not severe stenosis of the proximal M2 of the inferior L MCA. There was normal distal flow of the L PCA s/p stent placement.  Patient doing very well this AM and ready to go to CIR. Patient is eating well and strength continues to improve. Patient continues to have some weakness on her right side but exerts effort  to continue to improve. Currently awaiting bed in CIR. Patient will not have Loop recorder placed but will instead be started on Eliquis due to A fib being noted on tele this AM.   Stroke: Left PCA territory acute ischemia due to left P1 occlusion s/p IR and stenting with TICI3. Etiology likely new diagnosed afib.  CT head small acute infarct in the left occipital parietal cortex.   CTA head & neck: CTA head and neck showed left P1/P2 occlusion.  CT perfusion:left PCA penumbra which is smaller than the reported 42 mL mismatch volume as that also includes bilateral cerebellar tissue.  MRI/MRA: Left PCA territory acute ischemia involving the left occipital lobe, medial left temporal lobe and left thalamus. No acute       hemorrhage or mass effect.  Echo:LVEF 60-65%.   Loop recorder cancelled due to PAF found overnight  HgbA1c5.9   VTE prophylaxis: Lovenox  LDL 99  No antithromboticprior to admission, now on Brilinta (ticagrelor) 90 mg bid.Consult to pharmacy to start Eliquis. Discontinue ASA  Therapy recommendations: CIR  Disposition: Pending  New finding of A fib  Consult to Pharmacy to start Eliquis  Follow up with cardiology OP  Loop recorder cancelled.  Hypertension  BP goal < 180/105, gradually normalize in 5-7 days  Continue Amlodipine 10mg    Long-term BP goal normotensive  Hyperlipidemia  Home meds:none   LDL 110, goal < 22  lipitor 20 given advanced age  Continue statin on discharge  Other Stroke Risk Factors  Advanced age   Hospital day # 3  Damita Dunnings, MD PGY-1  ATTENDING NOTE: I reviewed above note and agree with the assessment and plan. Pt was seen and examined.   Son and daughter are at the bedside, pt lying in bed, awake alert and no neuro changes. Still has mild right sided weakness, leg worsen than arm. Found to have PAF overnight, loop recorder cancelled. Will start on eliquis, continue brilinta for stenting but  will d/c ASA to lower risk of bleeding. PT/OT recommend CIR. We did peer to peer with Dr. Amalia Hailey and decision of CIR approval, will admit in am.   For detailed assessment and plan, please refer to above as I have made changes wherever appropriate.   Rosalin Hawking, MD PhD Stroke Neurology 09/04/2020 11:19 PM     To contact Stroke Continuity provider, please refer to [email or web address] After hours, contact General Neurology

## 2020-09-04 NOTE — Progress Notes (Signed)
Physical Therapy Treatment Patient Details Name: Deanna Williams MRN: [id number] DOB: 1928-08-13 Today's Date: 09/04/2020    History of Present Illness 87 y.o. female admitted 4/19 with Rt sided weakness.  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  She underwent placement of Lt PCA stent.  PMH includes: palpatations, osteopenia, Lichen sclerousus et atrophicus, HTN    PT Comments    Patient progressing towards physical therapy goals. Patient ambulating at [facility] level with RW, chair follow beneficial as patient has difficulty with activity pacing and awareness of fatigue. Patient performed standing balance at sink with no UE support and min guard. Dynamic balance deficits more prevalent with no UE support. Continue to recommend comprehensive inpatient rehab (CIR) for post-acute therapy needs.     Follow Up Recommendations  CIR     Equipment Recommendations  Rolling Jontrell Bushong with 5" wheels    Recommendations for Other Services       Precautions / Restrictions Precautions Precautions: Fall Precaution Comments: Rt lateral lean Restrictions Weight Bearing Restrictions: No    Mobility  Bed Mobility Overal bed mobility: Needs Assistance Bed Mobility: Supine to Sit;Sit to Supine     Supine to sit: Supervision;HOB elevated Sit to supine: Supervision;HOB elevated   General bed mobility comments: supervision for safety    Transfers Overall transfer level: Needs assistance Equipment used: Rolling Jonasia Coiner (2 wheeled) Transfers: Sit to/from Stand Sit to Stand: Min guard;+2 safety/equipment         General transfer comment: minguard +2 for safety to rise from EOB and recliner, good hand placement noted  Ambulation/Gait Ambulation/Gait assistance: Min assist;+2 safety/equipment Gait Distance (Feet): 100  Feet Assistive device: Rolling Zyia Kaneko (2 wheeled) Gait Pattern/deviations: Step-through pattern;Decreased step length - right;Decreased step length - left;Decreased stride length;Decreased weight shift to right;Decreased weight shift to left;Decreased dorsiflexion - right;Trunk flexed Gait velocity: decreased   General Gait Details: ambulated with shoes with improved R ankle stability with support from shoe. Patient ambulated with minA for balance and RW management. Cues for upright posture and RW proximity. Difficulty with activity pacing and awareness of fatigue requiring chair follow   Stairs             Wheelchair Mobility    Modified Rankin (Stroke Patients Only)       Balance Overall balance assessment: Needs assistance Sitting-balance support: Feet supported Sitting balance-Leahy Scale: Fair Sitting balance - Comments: completed LB ADLs from EOB with no LOB   Standing balance support: No upper extremity supported;During functional activity Standing balance-Leahy Scale: Fair Standing balance comment: standing sink side completing ADLs with no UE support                            Cognition Arousal/Alertness: Awake/alert Behavior During Therapy: WFL for tasks assessed/performed Overall Cognitive Status: Impaired/Different from baseline Area of Impairment: Attention;Following commands;Awareness;Problem solving;Orientation                 Orientation Level: Disoriented to;Place Current Attention Level: Selective   Following Commands: Follows one step commands consistently;Follows multi-step commands with increased time   Awareness: Emergent Problem Solving: Slow processing;Difficulty sequencing;Requires verbal cues;Requires tactile cues General Comments: pt slightly disoriented to location thinking we were in [place]  Pertinent Vitals/Pain Pain Assessment: No/denies pain    Home Living                       Prior Function            PT Goals (current goals can now be found in the care plan section) Acute Rehab PT Goals Patient Stated Goal: to go CIR PT Goal Formulation: With patient/family Time For Goal Achievement: 09/16/20 Potential to Achieve Goals: Good Progress towards PT goals: Progressing toward goals    Frequency    Min 4X/week      PT Plan Current plan remains appropriate    Co-evaluation PT/OT/SLP Co-Evaluation/Treatment: Yes Reason for Co-Treatment: For patient/therapist safety;To address functional/ADL transfers PT goals addressed during session: Mobility/safety with mobility;Balance;Proper use of DME        AM-PAC PT "6 Clicks" Mobility   Outcome Measure  Help needed turning from your back to your side while in a flat bed without using bedrails?: A Little Help needed moving from lying on your back to sitting on the side of a flat bed without using bedrails?: A Little Help needed moving to and from a bed to a chair (including a wheelchair)?: A Little Help needed standing up from a chair using your arms (e.g., wheelchair or bedside chair)?: A Little Help needed to walk in hospital room?: A Little Help needed climbing 3-5 steps with a railing? : A Lot 6 Click Score: 17    End of Session Equipment Utilized During Treatment: Gait belt Activity Tolerance: Patient tolerated treatment well Patient left: in bed;with call bell/phone within reach;with bed alarm set Nurse Communication: Mobility status PT Visit Diagnosis: Unsteadiness on feet (R26.81);Other abnormalities of gait and mobility (R26.89);Muscle weakness (generalized) (M62.81);Difficulty in walking, not elsewhere classified (R26.2);Other symptoms and signs involving the nervous system (R29.898);Hemiplegia and hemiparesis Hemiplegia - Right/Left: Right Hemiplegia - dominant/non-dominant: Non-dominant Hemiplegia - caused by: Cerebral infarction     Time: 1610-9604 PT Time  Calculation (min) (ACUTE ONLY): 28 min  Charges:  $Gait Training: 8-22 mins                     Nikeria Kalman A. Gilford Rile PT, DPT Acute Rehabilitation Services Pager [phone] Office [phone]    Linna Hoff 09/04/2020, 4:49 PM

## 2020-09-05 ENCOUNTER — Other Ambulatory Visit: Payer: Self-pay

## 2020-09-05 ENCOUNTER — Inpatient Hospital Stay (HOSPITAL_COMMUNITY)
Admission: RE | Admit: 2020-09-05 | Discharge: 2020-09-11 | DRG: 057 | Disposition: A | Discharge status: Home Health | Payer: PPO | Source: Intra-hospital | Attending: Physical Medicine and Rehabilitation | Admitting: Physical Medicine and Rehabilitation

## 2020-09-05 ENCOUNTER — Encounter (HOSPITAL_COMMUNITY): Payer: Self-pay | Admitting: Physical Medicine and Rehabilitation

## 2020-09-05 DIAGNOSIS — R42 Dizziness and giddiness: Secondary | ICD-10-CM | POA: Diagnosis not present

## 2020-09-05 DIAGNOSIS — Y92239 Unspecified place in hospital as the place of occurrence of the external cause: Secondary | ICD-10-CM | POA: Diagnosis not present

## 2020-09-05 DIAGNOSIS — K5900 Constipation, unspecified: Secondary | ICD-10-CM | POA: Diagnosis not present

## 2020-09-05 DIAGNOSIS — I10 Essential (primary) hypertension: Secondary | ICD-10-CM | POA: Diagnosis present

## 2020-09-05 DIAGNOSIS — Z7901 Long term (current) use of anticoagulants: Secondary | ICD-10-CM

## 2020-09-05 DIAGNOSIS — Z8261 Family history of arthritis: Secondary | ICD-10-CM

## 2020-09-05 DIAGNOSIS — I63532 Cerebral infarction due to unspecified occlusion or stenosis of left posterior cerebral artery: Secondary | ICD-10-CM | POA: Diagnosis not present

## 2020-09-05 DIAGNOSIS — I4819 Other persistent atrial fibrillation: Secondary | ICD-10-CM | POA: Diagnosis not present

## 2020-09-05 DIAGNOSIS — I63531 Cerebral infarction due to unspecified occlusion or stenosis of right posterior cerebral artery: Secondary | ICD-10-CM

## 2020-09-05 DIAGNOSIS — E785 Hyperlipidemia, unspecified: Secondary | ICD-10-CM | POA: Diagnosis not present

## 2020-09-05 DIAGNOSIS — I69319 Unspecified symptoms and signs involving cognitive functions following cerebral infarction: Secondary | ICD-10-CM | POA: Diagnosis not present

## 2020-09-05 DIAGNOSIS — Z8673 Personal history of transient ischemic attack (TIA), and cerebral infarction without residual deficits: Secondary | ICD-10-CM | POA: Diagnosis present

## 2020-09-05 DIAGNOSIS — Z806 Family history of leukemia: Secondary | ICD-10-CM | POA: Diagnosis not present

## 2020-09-05 DIAGNOSIS — I4891 Unspecified atrial fibrillation: Secondary | ICD-10-CM | POA: Diagnosis not present

## 2020-09-05 DIAGNOSIS — Z8249 Family history of ischemic heart disease and other diseases of the circulatory system: Secondary | ICD-10-CM | POA: Diagnosis not present

## 2020-09-05 DIAGNOSIS — K219 Gastro-esophageal reflux disease without esophagitis: Secondary | ICD-10-CM | POA: Diagnosis present

## 2020-09-05 DIAGNOSIS — E871 Hypo-osmolality and hyponatremia: Secondary | ICD-10-CM | POA: Diagnosis present

## 2020-09-05 DIAGNOSIS — Z95820 Peripheral vascular angioplasty status with implants and grafts: Secondary | ICD-10-CM

## 2020-09-05 DIAGNOSIS — R7303 Prediabetes: Secondary | ICD-10-CM | POA: Diagnosis not present

## 2020-09-05 DIAGNOSIS — T45525A Adverse effect of antithrombotic drugs, initial encounter: Secondary | ICD-10-CM | POA: Diagnosis not present

## 2020-09-05 DIAGNOSIS — I69351 Hemiplegia and hemiparesis following cerebral infarction affecting right dominant side: Principal | ICD-10-CM

## 2020-09-05 LAB — BASIC METABOLIC PANEL
Anion gap: 7 (ref 5–15)
BUN: 13 mg/dL (ref 8–23)
CO2: 23 mmol/L (ref 22–32)
Calcium: 9.2 mg/dL (ref 8.9–10.3)
Chloride: 106 mmol/L (ref 98–111)
Creatinine, Ser: 0.74 mg/dL (ref 0.44–1.00)
GFR, Estimated: >60 mL/min (ref >60)
Glucose, Bld: 128 mg/dL — ABNORMAL HIGH (ref 70–99)
Potassium: 4.3 mmol/L (ref 3.5–5.1)
Sodium: 136 mmol/L (ref 135–145)

## 2020-09-05 LAB — CBC
HCT: 38.2 % (ref 36.0–46.0)
Hemoglobin: 12.9 g/dL (ref 12.0–15.0)
MCH: 31.6 pg (ref 26.0–34.0)
MCHC: 33.8 g/dL (ref 30.0–36.0)
MCV: 93.6 fL (ref 80.0–100.0)
Platelets: 225 10*3/uL (ref 150–400)
RBC: 4.08 MIL/uL (ref 3.87–5.11)
RDW: 12.4 % (ref 11.5–15.5)
WBC: 6.7 10*3/uL (ref 4.0–10.5)
nRBC: 0 % (ref 0.0–0.2)

## 2020-09-05 LAB — GLUCOSE, CAPILLARY: Glucose-Capillary: 126 mg/dL — ABNORMAL HIGH (ref 70–99)

## 2020-09-05 MED ORDER — GUAIFENESIN-DM 100-10 MG/5ML PO SYRP
5.0000 mL | ORAL_SOLUTION | Freq: Four times a day (QID) | ORAL | Status: DC | PRN
  Filled 2020-09-05: qty 10

## 2020-09-05 MED ORDER — ALUM & MAG HYDROXIDE-SIMETH 200-200-20 MG/5ML PO SUSP
30.0000 mL | ORAL | Status: DC | PRN
Start: 2020-09-05 — End: 2020-09-11

## 2020-09-05 MED ORDER — ATORVASTATIN CALCIUM 10 MG PO TABS
20.0000 mg | ORAL_TABLET | Freq: Every day | ORAL | Status: DC
  Administered 2020-09-06 – 2020-09-11 (×6): 20 mg via ORAL
  Filled 2020-09-05 (×6): qty 2

## 2020-09-05 MED ORDER — PROCHLORPERAZINE 25 MG RE SUPP: 12.5000 mg | Freq: Four times a day (QID) | RECTAL | Status: DC | PRN

## 2020-09-05 MED ORDER — AMLODIPINE BESYLATE 10 MG PO TABS
10.0000 mg | ORAL_TABLET | Freq: Every day | ORAL | Status: DC
  Administered 2020-09-06 – 2020-09-11 (×6): 10 mg via ORAL
  Filled 2020-09-05 (×6): qty 1

## 2020-09-05 MED ORDER — DOCUSATE SODIUM 100 MG PO CAPS
100.0000 mg | ORAL_CAPSULE | Freq: Two times a day (BID) | ORAL | Status: DC | PRN
  Administered 2020-09-05 – 2020-09-08 (×3): 100 mg via ORAL
  Filled 2020-09-05 (×2): qty 1

## 2020-09-05 MED ORDER — ENSURE ENLIVE PO LIQD
237.0000 mL | ORAL | Status: DC
  Administered 2020-09-05 – 2020-09-07 (×3): 237 mL via ORAL

## 2020-09-05 MED ORDER — AMLODIPINE BESYLATE 10 MG PO TABS: 10.0000 mg | ORAL_TABLET | Freq: Every day | ORAL | 0 refills | Status: DC

## 2020-09-05 MED ORDER — FLEET ENEMA 7-19 GM/118ML RE ENEM: 1.0000 | ENEMA | Freq: Once | RECTAL | Status: DC | PRN

## 2020-09-05 MED ORDER — PROCHLORPERAZINE MALEATE 5 MG PO TABS: 5.0000 mg | ORAL_TABLET | Freq: Four times a day (QID) | ORAL | Status: DC | PRN

## 2020-09-05 MED ORDER — TRAZODONE HCL 50 MG PO TABS
25.0000 mg | ORAL_TABLET | Freq: Every evening | ORAL | Status: DC | PRN
  Administered 2020-09-06 – 2020-09-07 (×2): 50 mg via ORAL
  Filled 2020-09-05 (×2): qty 1

## 2020-09-05 MED ORDER — ACETAMINOPHEN 325 MG PO TABS: 325.0000 mg | ORAL_TABLET | ORAL | Status: DC | PRN

## 2020-09-05 MED ORDER — DIPHENHYDRAMINE HCL 12.5 MG/5ML PO ELIX: 12.5000 mg | ORAL_SOLUTION | Freq: Four times a day (QID) | ORAL | Status: DC | PRN

## 2020-09-05 MED ORDER — APIXABAN 5 MG PO TABS: 5.0000 mg | ORAL_TABLET | Freq: Two times a day (BID) | ORAL | 0 refills | Status: DC

## 2020-09-05 MED ORDER — TICAGRELOR 90 MG PO TABS
90.0000 mg | ORAL_TABLET | Freq: Two times a day (BID) | ORAL | Status: DC
  Administered 2020-09-05 – 2020-09-11 (×12): 90 mg via ORAL
  Filled 2020-09-05 (×12): qty 1

## 2020-09-05 MED ORDER — TICAGRELOR 90 MG PO TABS: 90.0000 mg | ORAL_TABLET | Freq: Two times a day (BID) | ORAL | 0 refills | Status: DC

## 2020-09-05 MED ORDER — APIXABAN 5 MG PO TABS
5.0000 mg | ORAL_TABLET | Freq: Once | ORAL | Status: AC
  Administered 2020-09-05: 5 mg via ORAL
  Filled 2020-09-05: qty 1

## 2020-09-05 MED ORDER — APIXABAN 5 MG PO TABS
5.0000 mg | ORAL_TABLET | Freq: Two times a day (BID) | ORAL | Status: DC
  Administered 2020-09-05 – 2020-09-11 (×12): 5 mg via ORAL
  Filled 2020-09-05 (×12): qty 1

## 2020-09-05 MED ORDER — BISACODYL 10 MG RE SUPP
10.0000 mg | Freq: Every day | RECTAL | Status: DC | PRN
  Administered 2020-09-08: 10 mg via RECTAL
  Filled 2020-09-05 (×2): qty 1

## 2020-09-05 MED ORDER — PROCHLORPERAZINE EDISYLATE 10 MG/2ML IJ SOLN: 5.0000 mg | Freq: Four times a day (QID) | INTRAMUSCULAR | Status: DC | PRN

## 2020-09-05 MED ORDER — ATORVASTATIN CALCIUM 10 MG PO TABS: 20.0000 mg | ORAL_TABLET | Freq: Every day | ORAL | 0 refills | Status: DC

## 2020-09-05 MED ORDER — POLYETHYLENE GLYCOL 3350 17 G PO PACK
17.0000 g | PACK | Freq: Every day | ORAL | Status: DC | PRN
  Administered 2020-09-05 – 2020-09-06 (×2): 17 g via ORAL
  Filled 2020-09-05 (×2): qty 1

## 2020-09-05 NOTE — Discharge Summary (Signed)
Stroke Discharge Summary  Patient ID: Deanna Williams   MRN: [id number]      DOB: 12-21-1928  Date of Admission: 09/01/2020 Date of Discharge: 09/05/2020  Attending Physician:  Stroke, Md, MD, Stroke MD Consultant(s):   cardiology EP Patient's PCP:  Jerrol Banana., MD  Discharge Diagnoses: Active Problems:   Stroke (cerebrum) (HCC)   Acute cerebrovascular accident (CVA) due to occlusion of right posterior cerebral artery ([place])   Stroke [facility])   Atrial fibrillation ([place])   Medications to be continued on Rehab Allergies as of 09/05/2020   No Known Allergies     Medication List    STOP taking these medications   omeprazole 20 MG capsule Commonly known as: PRILOSEC   Premarin vaginal cream Generic drug: conjugated estrogens   terconazole 0.8 % vaginal cream Commonly known as: Terazol 3     TAKE these medications   amLODipine 10 MG tablet Commonly known as: NORVASC Take 1 tablet (10 mg total) by mouth daily. Start taking on: September 06, 2020   apixaban 5 MG Tabs tablet Commonly known as: ELIQUIS Take 1 tablet (5 mg total) by mouth 2 (two) times daily.   atorvastatin 10 MG tablet Commonly known as: LIPITOR Take 2 tablets (20 mg total) by mouth daily. Start taking on: September 06, 2020   ticagrelor 90 MG Tabs tablet Commonly known as: BRILINTA Take 1 tablet (90 mg total) by mouth 2 (two) times daily.       LABORATORY STUDIES CBC    Component Value Date/Time   WBC 6.7 09/05/2020 0314   RBC 4.08 09/05/2020 0314   HGB 12.9 09/05/2020 0314   HGB 14.4 01/21/2020 1041   HCT 38.2 09/05/2020 0314   HCT 41.3 01/21/2020 1041   PLT 225 09/05/2020 0314   PLT 233 01/21/2020 1041   MCV 93.6 09/05/2020 0314   MCV 93 01/21/2020 1041   MCH 31.6 09/05/2020 0314   MCHC 33.8 09/05/2020 0314   RDW 12.4 09/05/2020 0314   RDW 12.2 01/21/2020 1041   LYMPHSABS 1.4 09/01/2020 0654   LYMPHSABS 1.6 01/21/2020 1041   MONOABS 0.4 09/01/2020 0654   EOSABS 0.1 09/01/2020  0654   EOSABS 0.1 01/21/2020 1041   BASOSABS 0.0 09/01/2020 0654   BASOSABS 0.0 01/21/2020 1041   CMP    Component Value Date/Time   NA 136 09/05/2020 0314   NA 139 01/21/2020 1041   K 4.3 09/05/2020 0314   CL 106 09/05/2020 0314   CO2 23 09/05/2020 0314   GLUCOSE 128 (H) 09/05/2020 0314   BUN 13 09/05/2020 0314   BUN 14 01/21/2020 1041   CREATININE 0.74 09/05/2020 0314   CREATININE 0.82 03/07/2017 0855   CALCIUM 9.2 09/05/2020 0314   PROT 6.4 (L) 09/01/2020 0654   PROT 6.2 01/21/2020 1041   ALBUMIN 3.7 09/01/2020 0654   ALBUMIN 4.1 01/21/2020 1041   AST 24 09/01/2020 0654   ALT 12 09/01/2020 0654   ALKPHOS 75 09/01/2020 0654   BILITOT 1.2 09/01/2020 0654   BILITOT 0.4 01/21/2020 1041   GFRNONAA >60 09/05/2020 0314   GFRNONAA 65 03/07/2017 0855   GFRAA 66 01/21/2020 1041   GFRAA 75 03/07/2017 0855   COAGS Lab Results  Component Value Date   INR 1.0 09/01/2020   Lipid Panel    Component Value Date/Time   CHOL 188 09/02/2020 0441   CHOL 205 (H) 01/21/2020 1041   TRIG 68 09/02/2020 0441   HDL 75 09/02/2020 0441  HDL 81 01/21/2020 1041   CHOLHDL 2.5 09/02/2020 0441   VLDL 14 09/02/2020 0441   LDLCALC 99 09/02/2020 0441   LDLCALC 110 (H) 01/21/2020 1041   HgbA1C  Lab Results  Component Value Date   HGBA1C 5.9 (H) 09/02/2020   Urinalysis    Component Value Date/Time   COLORURINE STRAW (A) 09/01/2020 0654   APPEARANCEUR CLEAR (A) 09/01/2020 0654   LABSPEC 1.018 09/01/2020 0654   PHURINE 9.0 (H) 09/01/2020 0654   GLUCOSEU NEGATIVE 09/01/2020 0654   HGBUR MODERATE (A) 09/01/2020 0654   BILIRUBINUR NEGATIVE 09/01/2020 0654   BILIRUBINUR Negative 07/20/2020 0832   KETONESUR NEGATIVE 09/01/2020 0654   PROTEINUR NEGATIVE 09/01/2020 0654   UROBILINOGEN 0.2 07/20/2020 0832   NITRITE NEGATIVE 09/01/2020 0654   LEUKOCYTESUR MODERATE (A) 09/01/2020 0654   Urine Drug Screen     Component Value Date/Time   LABOPIA NONE DETECTED 09/01/2020 0654   COCAINSCRNUR  NONE DETECTED 09/01/2020 0654   LABBENZ NONE DETECTED 09/01/2020 0654   AMPHETMU NONE DETECTED 09/01/2020 0654   THCU NONE DETECTED 09/01/2020 0654   LABBARB NONE DETECTED 09/01/2020 0654    Alcohol Level No results found for: ETH   SIGNIFICANT DIAGNOSTIC STUDIES CT Angio Head W or Wo Contrast  Result Date: 09/01/2020 CLINICAL DATA:  Right-sided facial droop and weakness since 10 p.m. EXAM: CT ANGIOGRAPHY HEAD AND NECK TECHNIQUE: Multidetector CT imaging of the head and neck was performed using the standard protocol during bolus administration of intravenous contrast. Multiplanar CT image reconstructions and MIPs were obtained to evaluate the vascular anatomy. Carotid stenosis measurements (when applicable) are obtained utilizing NASCET criteria, using the distal internal carotid diameter as the denominator. CONTRAST:  50mL OMNIPAQUE IOHEXOL 350 MG/ML SOLN COMPARISON:  Noncontrast head CT from earlier today FINDINGS: CTA NECK FINDINGS Aortic arch: Atheromatous plaque.  Three vessel branching. Right carotid system: Atheromatous wall thickening diffusely. Calcified plaque at the bifurcation without flow limiting stenosis or ulceration. Left carotid system: Circumferential atheromatous wall thickening. Calcified plaque at the bifurcation without ulceration or flow limiting stenosis. Vertebral arteries: No proximal subclavian flow limiting stenosis. Right dominant vertebral artery. Both vertebral arteries are patent to the dura. There is limitation to visualization of the proximal vertebral lumen on both sides due to artifact from intravenous contrast. Skeleton: Cervical spine degeneration with C3-4 and C4-5 anterolisthesis. Other neck: Extensive intravenous reflux of contrast. There is a stenotic appearance of the left brachiocephalic vein. The bolus dispersion cause a sub optimal opacification of arterial structures. Upper chest: Negative Review of the MIP images confirms the above findings CTA HEAD  FINDINGS Anterior circulation: Atheromatous calcification at the carotid siphons. Hypoplastic left A1 segment with mildly larger right ICA. Atheromatous changes to medium vessels. There is a notable high-grade narrowing at the right A2 level. Severe narrowing of the left lower M2 branch seen on reformats. Posterior circulation: Right dominant vertebral artery. Atheromatous plaque at both V4 segments with 50-60% narrowing of the left V4 segment. The basilar artery is smooth and diffusely patent. Left P2 branch occlusion with subsequent reconstitution and then subsequent severe stenosis at the PCA bifurcation. Negative for aneurysm. Venous sinuses: Diffusely patent Anatomic variants: None significant Review of the MIP images confirms the above findings These results were called by telephone at the time of interpretation on 09/01/2020 at 7:48 am to provider [facility] , who verbally acknowledged these results. IMPRESSION: 1. Confirmed left PCA occlusion at the left P2 segment with downstream reconstitution followed by a severe stenosis at the left  PCA bifurcation. 2. Intracranial atherosclerosis with notable right A2, left M2, and left V4 segment stenoses. 3. Atherosclerosis in the neck without flow limiting stenosis. 4. Left brachiocephalic vein stenosis. Electronically Signed   By: Monte Fantasia M.D.   On: 09/01/2020 07:49   CT HEAD WO CONTRAST  Result Date: 09/01/2020 CLINICAL DATA:  Stroke follow-up. EXAM: CT HEAD WITHOUT CONTRAST TECHNIQUE: Contiguous axial images were obtained from the base of the skull through the vertex without intravenous contrast. COMPARISON:  Same day CT head. FINDINGS: Brain: Similar subtle hypodensity in the left temporal occipital cortex. Areas of subtle cortical hyperdensity described on the recent prior have resolved and likely represented contrast staining recent catheter arteriogram. No evidence of new acute large vascular territory infarct. No acute hemorrhage. No  progressive mass effect. No midline shift. Basal cisterns are patent. No hydrocephalus. No mass lesion. No visible extra-axial fluid collections. Vascular: Left PCA stent. Vasculature better characterized on same day catheter arteriogram. Skull: No acute fracture. Sinuses/Orbits: No acute abnormality. Other: Small left mastoid effusion. IMPRESSION: 1. Similar suspected small acute infarct in the left PCA territory. No progressive mass effect. MRI could further characterize if clinically indicated. 2. Areas of subtle cortical hyperdensity described on the recent prior have resolved and likely represented contrast staining from recent catheter arteriogram. No evidence of acute hemorrhage. Electronically Signed   By: Margaretha Sheffield MD   On: 09/01/2020 16:16   CT HEAD WO CONTRAST  Result Date: 09/01/2020 CLINICAL DATA:  Stroke follow-up post intervention. EXAM: CT HEAD WITHOUT CONTRAST TECHNIQUE: Contiguous axial images were obtained from the base of the skull through the vertex without intravenous contrast. COMPARISON:  Same day CT exams. FINDINGS: Brain: Similar subtle loaded in the left temporal occipital cortex. No evidence of mass occupying acute hemorrhage. Subtle/faint cortical versus extra-axial hyperdensity in the posteroinferior right temporal and occipital lobes (for example see series 6, image 29 and image 19). Evaluation for small volume subarachnoid hemorrhage is somewhat limited due to residual contrast within the vasculature. No hydrocephalus. No midline shift. Basal cisterns are patent. No mass lesion. Vascular: Residual contrast is noted within the vasculature from same day contrasted studies. Vasculature is better evaluated on same day catheter arteriogram. Skull: No acute fracture. Sinuses/Orbits: No acute findings. Other: Mastoid effusions. IMPRESSION: 1. No substantial change in small acute infarct in the left occipital parietal cortex. No progressive mass effect 2. Subtle/faint cortical  versus extra-axial hyperdensity in the posteroinferior right temporal and occipital lobes, which could represent contrast staining given recent catheter arteriogram versus trace hemorrhage. No mass occupying acute hemorrhage. Recommend attention on close follow-up. Electronically Signed   By: Margaretha Sheffield MD   On: 09/01/2020 12:49   CT Angio Neck W and/or Wo Contrast  Result Date: 09/01/2020 CLINICAL DATA:  Right-sided facial droop and weakness since 10 p.m. EXAM: CT ANGIOGRAPHY HEAD AND NECK TECHNIQUE: Multidetector CT imaging of the head and neck was performed using the standard protocol during bolus administration of intravenous contrast. Multiplanar CT image reconstructions and MIPs were obtained to evaluate the vascular anatomy. Carotid stenosis measurements (when applicable) are obtained utilizing NASCET criteria, using the distal internal carotid diameter as the denominator. CONTRAST:  75mL OMNIPAQUE IOHEXOL 350 MG/ML SOLN COMPARISON:  Noncontrast head CT from earlier today FINDINGS: CTA NECK FINDINGS Aortic arch: Atheromatous plaque.  Three vessel branching. Right carotid system: Atheromatous wall thickening diffusely. Calcified plaque at the bifurcation without flow limiting stenosis or ulceration. Left carotid system: Circumferential atheromatous wall thickening. Calcified plaque at the  bifurcation without ulceration or flow limiting stenosis. Vertebral arteries: No proximal subclavian flow limiting stenosis. Right dominant vertebral artery. Both vertebral arteries are patent to the dura. There is limitation to visualization of the proximal vertebral lumen on both sides due to artifact from intravenous contrast. Skeleton: Cervical spine degeneration with C3-4 and C4-5 anterolisthesis. Other neck: Extensive intravenous reflux of contrast. There is a stenotic appearance of the left brachiocephalic vein. The bolus dispersion cause a sub optimal opacification of arterial structures. Upper chest:  Negative Review of the MIP images confirms the above findings CTA HEAD FINDINGS Anterior circulation: Atheromatous calcification at the carotid siphons. Hypoplastic left A1 segment with mildly larger right ICA. Atheromatous changes to medium vessels. There is a notable high-grade narrowing at the right A2 level. Severe narrowing of the left lower M2 branch seen on reformats. Posterior circulation: Right dominant vertebral artery. Atheromatous plaque at both V4 segments with 50-60% narrowing of the left V4 segment. The basilar artery is smooth and diffusely patent. Left P2 branch occlusion with subsequent reconstitution and then subsequent severe stenosis at the PCA bifurcation. Negative for aneurysm. Venous sinuses: Diffusely patent Anatomic variants: None significant Review of the MIP images confirms the above findings These results were called by telephone at the time of interpretation on 09/01/2020 at 7:48 am to provider [facility] , who verbally acknowledged these results. IMPRESSION: 1. Confirmed left PCA occlusion at the left P2 segment with downstream reconstitution followed by a severe stenosis at the left PCA bifurcation. 2. Intracranial atherosclerosis with notable right A2, left M2, and left V4 segment stenoses. 3. Atherosclerosis in the neck without flow limiting stenosis. 4. Left brachiocephalic vein stenosis. Electronically Signed   By: Monte Fantasia M.D.   On: 09/01/2020 07:49   MR ANGIO HEAD WO CONTRAST  Result Date: 09/02/2020 CLINICAL DATA:  Stroke follow-up status post thrombectomy. EXAM: MRI HEAD WITHOUT CONTRAST MRA HEAD WITHOUT CONTRAST TECHNIQUE: Multiplanar, multiecho pulse sequences of the brain and surrounding structures were obtained without intravenous contrast. Angiographic images of the head were obtained using MRA technique without contrast. COMPARISON:  Head CT 09/01/2020 FINDINGS: MRI HEAD FINDINGS Brain: Left PCA territory acute ischemia involving the left occipital lobe,  medial left temporal lobe and left thalamus. There is also punctate focus of abnormal diffusion restriction at the base of the right precentral gyrus. Focus of chronic microhemorrhage in the brainstem. No acute hemorrhage. There is multifocal hyperintense T2-weighted signal within the white matter. Parenchymal volume and CSF spaces are normal. The midline structures are normal. Vascular: Major flow voids are preserved. Skull and upper cervical spine: Normal calvarium and skull base. Visualized upper cervical spine and soft tissues are normal. Sinuses/Orbits:No paranasal sinus fluid levels or advanced mucosal thickening. No mastoid or middle ear effusion. Normal orbits. MRA HEAD FINDINGS POSTERIOR CIRCULATION: --Vertebral arteries: Normal --Inferior cerebellar arteries: Normal. --Basilar artery: Normal. --Superior cerebellar arteries: Normal. --Posterior cerebral arteries: There is now a stent in the left PCA P1 and P2 segments. There is normal distal flow related enhancement. Right PCA is normal. ANTERIOR CIRCULATION: --Intracranial internal carotid arteries: Normal. --Anterior cerebral arteries (ACA): Normal. --Middle cerebral arteries (MCA): Severe stenosis of the proximal M2 segment inferior division of the left MCA. No stenosis of the right MCA. ANATOMIC VARIANTS: None IMPRESSION: 1. Left PCA territory acute ischemia involving the left occipital lobe, medial left temporal lobe and left thalamus. No acute hemorrhage or mass effect. 2. Punctate focus of acute ischemia at the base of the right precentral gyrus. 3. Severe stenosis  of the proximal M2 segment inferior division of the left MCA. 4. Status post stent placement of left PCA P1 and P2 segments with normal distal flow related enhancement. Electronically Signed   By: Ulyses Jarred M.D.   On: 09/02/2020 03:38   MR BRAIN WO CONTRAST  Result Date: 09/02/2020 CLINICAL DATA:  Stroke follow-up status post thrombectomy. EXAM: MRI HEAD WITHOUT CONTRAST MRA HEAD  WITHOUT CONTRAST TECHNIQUE: Multiplanar, multiecho pulse sequences of the brain and surrounding structures were obtained without intravenous contrast. Angiographic images of the head were obtained using MRA technique without contrast. COMPARISON:  Head CT 09/01/2020 FINDINGS: MRI HEAD FINDINGS Brain: Left PCA territory acute ischemia involving the left occipital lobe, medial left temporal lobe and left thalamus. There is also punctate focus of abnormal diffusion restriction at the base of the right precentral gyrus. Focus of chronic microhemorrhage in the brainstem. No acute hemorrhage. There is multifocal hyperintense T2-weighted signal within the white matter. Parenchymal volume and CSF spaces are normal. The midline structures are normal. Vascular: Major flow voids are preserved. Skull and upper cervical spine: Normal calvarium and skull base. Visualized upper cervical spine and soft tissues are normal. Sinuses/Orbits:No paranasal sinus fluid levels or advanced mucosal thickening. No mastoid or middle ear effusion. Normal orbits. MRA HEAD FINDINGS POSTERIOR CIRCULATION: --Vertebral arteries: Normal --Inferior cerebellar arteries: Normal. --Basilar artery: Normal. --Superior cerebellar arteries: Normal. --Posterior cerebral arteries: There is now a stent in the left PCA P1 and P2 segments. There is normal distal flow related enhancement. Right PCA is normal. ANTERIOR CIRCULATION: --Intracranial internal carotid arteries: Normal. --Anterior cerebral arteries (ACA): Normal. --Middle cerebral arteries (MCA): Severe stenosis of the proximal M2 segment inferior division of the left MCA. No stenosis of the right MCA. ANATOMIC VARIANTS: None IMPRESSION: 1. Left PCA territory acute ischemia involving the left occipital lobe, medial left temporal lobe and left thalamus. No acute hemorrhage or mass effect. 2. Punctate focus of acute ischemia at the base of the right precentral gyrus. 3. Severe stenosis of the proximal M2  segment inferior division of the left MCA. 4. Status post stent placement of left PCA P1 and P2 segments with normal distal flow related enhancement. Electronically Signed   By: Ulyses Jarred M.D.   On: 09/02/2020 03:38   IR Intra Cran Stent  Result Date: 09/04/2020 INDICATION: Acute onset of right-sided weakness and numbness. Reportedly also had right facial numbness and right facial droop. CT angiogram of the head and neck revealed a left PCA occlusion. EXAM: 1. EMERGENT LARGE VESSEL OCCLUSION THROMBOLYSIS (POSTERIOR CIRCULATION) COMPARISON:  CT angiogram of the head and neck of September 01, 2020. MEDICATIONS: Ancef 2 g IV antibiotic was administered within 1 hour of the procedure. ANESTHESIA/SEDATION: General anesthesia CONTRAST:  Isovue 300 approximately 110 mL FLUOROSCOPY TIME:  Fluoroscopy Time: 66 minutes 24 seconds (2192mG y). COMPLICATIONS: None immediate. TECHNIQUE: Following a full explanation of the procedure along with the potential associated complications, an informed witnessed consent was obtained. The risks of intracranial hemorrhage of 10%, worsening neurological deficit, ventilator dependency, death and inability to revascularize were all reviewed in detail with the patient. The patient was then put under general anesthesia by the Department of Anesthesiology at [facility]. The right groin was prepped and draped in the usual sterile fashion. Thereafter using modified Seldinger technique, transfemoral access into the right common femoral artery was obtained without difficulty. Over a 0.035 inch guidewire an 8 French 25 cm Pinnacle sheath was inserted. Through this, and also over a 0.035 inch guidewire  a 5 [place] JB 1 catheter was advanced to the aortic arch region and selectively positioned in the left subclavian artery, and the right vertebral artery subclavian artery. FINDINGS: Left subclavian arteriogram demonstrates patency of the left vertebral artery, proximally associated with mild  to moderate tortuosity more distally. Patency is maintained of the left vertebrobasilar junction and basilar artery and the left superior inferior cerebellar artery. Unopacified blood is seen in the basilar artery from the contralateral right vertebral artery. The right subclavian arteriogram demonstrates the right subclavian artery to be widely patent. The dominant right vertebral artery demonstrates patency proximally associated with moderate tortuosity. More distally the vessel is seen to opacify to the cranial skull base. Patency is seen of the right vertebrobasilar junction and the right posterior-inferior cerebellar artery. The basilar artery, the right posterior cerebral artery, the superior cerebellar arteries and the anterior-inferior cerebellar arteries opacify into the capillary and venous phases. Complete angiographic occlusion is seen of the left posterior cerebral artery mid P1 segment. PROCEDURE: The diagnostic JB 1 catheter in the more distal right subclavian artery was then exchanged over a 0.035 inch 300 cm Rosen exchange guidewire for an 80 cm 8 [place] Neuron Max sheath. Good aspiration obtained from the hub of the Neuron Max sheath following removal of the exchange Rosen guidewire. A control arteriogram performed through the Neuron Max sheath demonstrated no evidence of vasospasm, or of dissections. Through the Neuron Max sheath in the right subclavian artery, over a 0.035 inch Roadrunner guidewire, a 5 [place] JB 1 catheter was then advanced without difficulty to the distal right vertebral artery followed by the Neuron Max sheath. The guidewire was removed. Good aspiration obtained from the hub of the Neuron Max sheath with the distal end at the junction of the distal and the mid 1/3 of the dominant right vertebral artery. A gentle control arteriogram performed through this demonstrated no evidence of spasm, dissection or of intraluminal filling defects. Over a 0.14 inch standard Synchro micro  guidewire, and inside an 055 130 cm Zoom aspiration catheter an 021 microcatheter combination was advanced to the distal basilar artery. The micro guidewire was then gently advanced through the occluded left posterior cerebral artery into the P2 P3 segment followed by the microcatheter. The micro guidewire was removed. Good aspiration obtained from the hub of the microcatheter. A gentle control arteriogram performed through the microcatheter demonstrated safe position of the tip of the microcatheter with excellent antegrade washout of the contrast. This was then connected to continuous heparinized saline infusion. A 3 mm x 40 mm Solitaire X retrieval device was then advanced to the distal end of the microcatheter and deployed by unsheathing. The 30 Zoom aspiration catheter was now advanced into the previously occluded left posterior cerebral artery. With constant aspiration being applied at the hub of the aspiration catheter, and at the hub of the Neuron Max sheath, the combination of the retrieval device, and the microcatheter were retrieved and removed. The Zoom aspiration catheter was now retrieved to the distal right vertebrobasilar junction. Arteriogram performed through the 55 aspiration catheter now demonstrated complete revascularization of the left posterior cerebral artery achieving a TICI 3 revascularization. Also uncovered was approximately 50-60% stenosis at the site of the previous occlusion, indicative of intracranial arteriosclerosis. A post thrombectomy 10 minute arteriogram performed through the aspiration catheter now demonstrated the angiographic occlusion of the left posterior cerebral artery. At this time a CT of the brain demonstrated no evidence of intracranial hemorrhage. Over a 0.014 inch standard Synchro micro  guidewire the 55 Zoom aspiration catheter, an 021 Trevo microcatheter was advanced to the proximal left posterior cerebral artery. Using a torque device, the micro guidewire was  gently advanced without difficulty to the distal P2 P3 segment followed by the microcatheter. The guidewire was removed. Good aspiration was obtained from the hub of the microcatheter. Again gentle contrast injection demonstrated safe position of the tip of the microcatheter which was now connected to continuous heparinized saline infusion. The left posterior cerebral artery P1 to P2 segment was then measured in terms of length and also diameter. It was elected to proceed with placement of a 4.5 mm x 24 mm Neuroform Atlas stent for revascularization. Prior to this, the patient was loaded with a bolus dose of weight based Aggrastat IV followed by a 4 hour infusion. Patient was also given Brilinta 180 mg, and aspirin 81 mg via an orogastric tube. The 4.5 mm x 24 mm Atlas Neuroform stent was then advanced into the distal end of the microcatheter. The O ring on the delivery microcatheter was then loosened. The proximal and the distal landing zones were then defined. Thereafter, with slight forward gentle traction with the right hand on the delivery micro guidewire with the left hand the distal and then the proximal portion of the stent were deployed. A control arteriogram performed through the 55 aspiration catheter in the basilar artery again demonstrated reconstitution of a TICI 3 revascularization of the left posterior cerebral artery territory. Patency was also noted of the previously noted site of intracranial arteriosclerosis. The micro guidewire, and the micro guidewire were retrieved and removed. Control arteriogram were then performed at 10 and 20 minutes post stent deployment with steadily slow flow proximally and distally through the stent. Given the patient had already been loaded with dual antiplatelets as described above, Neuron Max sheath in the right vertebral artery was removed. The 8 French sheath was then replaced with an 8 French Angio-Seal closure device with hemostasis. Distal pulses continued to  be patent by Doppler in both feet unchanged. CT of the brain performed in the CT suite demonstrated no evidence of hemorrhage or mass effect or midline shift. Patient was then transferred to the neuro ICU for post revascularization management. IMPRESSION: Status post endovascular complete revascularization of the left posterior cerebral artery P1 segment with 1 pass with a 3 mm x 40 mm Solitaire X retrieval device and contact aspiration achieving a TICI 3 revascularization. Status post rescue stenting of reocclusion with restoration of TICI 3 recanalization of the left posterior cerebral artery territory. Slow antegrade flow seen in the left posterior cerebral artery at the termination of the procedure. PLAN: Follow-up in clinic 2-4 weeks post discharge. Electronically Signed   By: Luanne Bras M.D.   On: 09/02/2020 14:48   IR CT Head Ltd  Result Date: 09/04/2020 INDICATION: Acute onset of right-sided weakness and numbness. Reportedly also had right facial numbness and right facial droop. CT angiogram of the head and neck revealed a left PCA occlusion. EXAM: 1. EMERGENT LARGE VESSEL OCCLUSION THROMBOLYSIS (POSTERIOR CIRCULATION) COMPARISON:  CT angiogram of the head and neck of September 01, 2020. MEDICATIONS: Ancef 2 g IV antibiotic was administered within 1 hour of the procedure. ANESTHESIA/SEDATION: General anesthesia CONTRAST:  Isovue 300 approximately 110 mL FLUOROSCOPY TIME:  Fluoroscopy Time: 66 minutes 24 seconds (2192mG y). COMPLICATIONS: None immediate. TECHNIQUE: Following a full explanation of the procedure along with the potential associated complications, an informed witnessed consent was obtained. The risks of intracranial hemorrhage of 10%,  worsening neurological deficit, ventilator dependency, death and inability to revascularize were all reviewed in detail with the patient. The patient was then put under general anesthesia by the Department of Anesthesiology at [facility]. The right  groin was prepped and draped in the usual sterile fashion. Thereafter using modified Seldinger technique, transfemoral access into the right common femoral artery was obtained without difficulty. Over a 0.035 inch guidewire an 8 French 25 cm Pinnacle sheath was inserted. Through this, and also over a 0.035 inch guidewire a 5 [place] JB 1 catheter was advanced to the aortic arch region and selectively positioned in the left subclavian artery, and the right vertebral artery subclavian artery. FINDINGS: Left subclavian arteriogram demonstrates patency of the left vertebral artery, proximally associated with mild to moderate tortuosity more distally. Patency is maintained of the left vertebrobasilar junction and basilar artery and the left superior inferior cerebellar artery. Unopacified blood is seen in the basilar artery from the contralateral right vertebral artery. The right subclavian arteriogram demonstrates the right subclavian artery to be widely patent. The dominant right vertebral artery demonstrates patency proximally associated with moderate tortuosity. More distally the vessel is seen to opacify to the cranial skull base. Patency is seen of the right vertebrobasilar junction and the right posterior-inferior cerebellar artery. The basilar artery, the right posterior cerebral artery, the superior cerebellar arteries and the anterior-inferior cerebellar arteries opacify into the capillary and venous phases. Complete angiographic occlusion is seen of the left posterior cerebral artery mid P1 segment. PROCEDURE: The diagnostic JB 1 catheter in the more distal right subclavian artery was then exchanged over a 0.035 inch 300 cm Rosen exchange guidewire for an 80 cm 8 [place] Neuron Max sheath. Good aspiration obtained from the hub of the Neuron Max sheath following removal of the exchange Rosen guidewire. A control arteriogram performed through the Neuron Max sheath demonstrated no evidence of vasospasm, or of  dissections. Through the Neuron Max sheath in the right subclavian artery, over a 0.035 inch Roadrunner guidewire, a 5 [place] JB 1 catheter was then advanced without difficulty to the distal right vertebral artery followed by the Neuron Max sheath. The guidewire was removed. Good aspiration obtained from the hub of the Neuron Max sheath with the distal end at the junction of the distal and the mid 1/3 of the dominant right vertebral artery. A gentle control arteriogram performed through this demonstrated no evidence of spasm, dissection or of intraluminal filling defects. Over a 0.14 inch standard Synchro micro guidewire, and inside an 055 130 cm Zoom aspiration catheter an 021 microcatheter combination was advanced to the distal basilar artery. The micro guidewire was then gently advanced through the occluded left posterior cerebral artery into the P2 P3 segment followed by the microcatheter. The micro guidewire was removed. Good aspiration obtained from the hub of the microcatheter. A gentle control arteriogram performed through the microcatheter demonstrated safe position of the tip of the microcatheter with excellent antegrade washout of the contrast. This was then connected to continuous heparinized saline infusion. A 3 mm x 40 mm Solitaire X retrieval device was then advanced to the distal end of the microcatheter and deployed by unsheathing. The 7 Zoom aspiration catheter was now advanced into the previously occluded left posterior cerebral artery. With constant aspiration being applied at the hub of the aspiration catheter, and at the hub of the Neuron Max sheath, the combination of the retrieval device, and the microcatheter were retrieved and removed. The Zoom aspiration catheter was now retrieved to  the distal right vertebrobasilar junction. Arteriogram performed through the 55 aspiration catheter now demonstrated complete revascularization of the left posterior cerebral artery achieving a TICI 3  revascularization. Also uncovered was approximately 50-60% stenosis at the site of the previous occlusion, indicative of intracranial arteriosclerosis. A post thrombectomy 10 minute arteriogram performed through the aspiration catheter now demonstrated the angiographic occlusion of the left posterior cerebral artery. At this time a CT of the brain demonstrated no evidence of intracranial hemorrhage. Over a 0.014 inch standard Synchro micro guidewire the 47 Zoom aspiration catheter, an 021 Trevo microcatheter was advanced to the proximal left posterior cerebral artery. Using a torque device, the micro guidewire was gently advanced without difficulty to the distal P2 P3 segment followed by the microcatheter. The guidewire was removed. Good aspiration was obtained from the hub of the microcatheter. Again gentle contrast injection demonstrated safe position of the tip of the microcatheter which was now connected to continuous heparinized saline infusion. The left posterior cerebral artery P1 to P2 segment was then measured in terms of length and also diameter. It was elected to proceed with placement of a 4.5 mm x 24 mm Neuroform Atlas stent for revascularization. Prior to this, the patient was loaded with a bolus dose of weight based Aggrastat IV followed by a 4 hour infusion. Patient was also given Brilinta 180 mg, and aspirin 81 mg via an orogastric tube. The 4.5 mm x 24 mm Atlas Neuroform stent was then advanced into the distal end of the microcatheter. The O ring on the delivery microcatheter was then loosened. The proximal and the distal landing zones were then defined. Thereafter, with slight forward gentle traction with the right hand on the delivery micro guidewire with the left hand the distal and then the proximal portion of the stent were deployed. A control arteriogram performed through the 55 aspiration catheter in the basilar artery again demonstrated reconstitution of a TICI 3 revascularization of the  left posterior cerebral artery territory. Patency was also noted of the previously noted site of intracranial arteriosclerosis. The micro guidewire, and the micro guidewire were retrieved and removed. Control arteriogram were then performed at 10 and 20 minutes post stent deployment with steadily slow flow proximally and distally through the stent. Given the patient had already been loaded with dual antiplatelets as described above, Neuron Max sheath in the right vertebral artery was removed. The 8 French sheath was then replaced with an 8 French Angio-Seal closure device with hemostasis. Distal pulses continued to be patent by Doppler in both feet unchanged. CT of the brain performed in the CT suite demonstrated no evidence of hemorrhage or mass effect or midline shift. Patient was then transferred to the neuro ICU for post revascularization management. IMPRESSION: Status post endovascular complete revascularization of the left posterior cerebral artery P1 segment with 1 pass with a 3 mm x 40 mm Solitaire X retrieval device and contact aspiration achieving a TICI 3 revascularization. Status post rescue stenting of reocclusion with restoration of TICI 3 recanalization of the left posterior cerebral artery territory. Slow antegrade flow seen in the left posterior cerebral artery at the termination of the procedure. PLAN: Follow-up in clinic 2-4 weeks post discharge. Electronically Signed   By: Luanne Bras M.D.   On: 09/02/2020 14:48   CT CEREBRAL PERFUSION W CONTRAST  Result Date: 09/01/2020 CLINICAL DATA:  Right-sided facial droop and weakness. Left P2 occlusion on CTA. EXAM: CT PERFUSION BRAIN TECHNIQUE: Multiphase CT imaging of the brain was performed following IV  bolus contrast injection. Subsequent parametric perfusion maps were calculated using RAPID software. CONTRAST:  68mL OMNIPAQUE IOHEXOL 350 MG/ML SOLN COMPARISON:  Head and neck CTA earlier today FINDINGS: CT Brain Perfusion Findings: CBF  (<30%) Volume: 0 mL Perfusion (Tmax>6.0s) volume: 42 mL Mismatch Volume: 42 mL Infarction Location: The small acute left temporo-occipital infarct on today's noncontrast CT was not detected by automated CTP processing. There is penumbra in the left PCA territory although the reported mismatch volume also includes tissue in both cerebellar hemispheres. IMPRESSION: Left PCA penumbra which is smaller than the reported 42 mL mismatch volume as that also includes bilateral cerebellar tissue. These results were communicated to Dr. Rory Percy at 8:43 am on 09/01/2020 by text page via the [facility] messaging system. Electronically Signed   By: Logan Bores M.D.   On: 09/01/2020 08:47   DG CHEST PORT 1 VIEW  Result Date: 09/02/2020 CLINICAL DATA:  Stroke. EXAM: PORTABLE CHEST 1 VIEW COMPARISON:  09/01/2020.  07/18/2018 FINDINGS: Interim extubation and removal of NG tube. Stable cardiomegaly. No pulmonary venous congestion. No focal infiltrate. Questionable density right upper lung possibly a small area of atelectasis. Follow-up PA and lateral chest x-ray suggested pulmonary lesion. Tiny calcified pulmonary nodule in the right again noted consistent with calcified granuloma. Prominent hiatal hernia again noted. Degenerative changes scoliosis thoracic spine. IMPRESSION: 1.  Interim extubation and removal of NG tube. 2.  Stable cardiomegaly. 3. Questionable density right upper lung, possibly a small area of atelectasis. Follow-up PA lateral chest x-ray suggested to exclude a focal pulmonary lesion. 4.  Prominent hiatal hernia again noted. Electronically Signed   By: Marcello Moores  Register   On: 09/02/2020 06:29   DG Chest Port 1 View  Result Date: 09/01/2020 CLINICAL DATA:  Endotracheal tube placement. EXAM: PORTABLE CHEST 1 VIEW COMPARISON:  July 18, 2018. FINDINGS: The heart size and mediastinal contours are within normal limits. Endotracheal tube is in grossly good position. Nasogastric tube is looped within the hiatal hernia. No  pneumothorax is noted. Left lung is clear. Mild right basilar subsegmental atelectasis is noted. The visualized skeletal structures are unremarkable. IMPRESSION: Endotracheal tube in grossly good position. Nasogastric tube tip is seen within the hiatal hernia. Mild right basilar subsegmental atelectasis. Electronically Signed   By: Marijo Conception M.D.   On: 09/01/2020 16:26   ECHOCARDIOGRAM COMPLETE  Result Date: 09/01/2020    ECHOCARDIOGRAM REPORT   Patient Name:   Deanna Williams [facility] Date of Exam: 09/01/2020 Medical Rec #:  [id number]       Height:       63.0 in Accession #:    [id number]      Weight:       150.0 lb Date of Birth:  [birth year]       BSA:          7.711 m Patient Age:    39 years        BP:           133/60 mmHg Patient Gender: F               HR:           86 bpm. Exam Location:  Inpatient Procedure: 2D Echo, Color Doppler and Cardiac Doppler Indications:    Stroke I63.9  History:        Patient has no prior history of Echocardiogram examinations.                 Risk Factors:Hypertension and Dyslipidemia.  Sonographer:  Darlina Sicilian RDCS Referring Phys: [id number] [facility]  Sonographer Comments: Suboptimal parasternal window, suboptimal apical window and echo performed with patient supine and on artificial respirator. IMPRESSIONS  1. Left ventricular ejection fraction, by estimation, is 60 to 65%. The left ventricle has normal function. The left ventricle has no regional wall motion abnormalities. Left ventricular diastolic parameters are consistent with Grade I diastolic dysfunction (impaired relaxation).  2. Right ventricular systolic function is normal. The right ventricular size is normal. There is mildly elevated pulmonary artery systolic pressure.  3. The mitral valve is normal in structure. No evidence of mitral valve regurgitation. No evidence of mitral stenosis.  4. The aortic valve is normal in structure. Aortic valve regurgitation is not visualized. No aortic stenosis is present.  5.  The inferior vena cava is normal in size with greater than 50% respiratory variability, suggesting right atrial pressure of 3 mmHg. FINDINGS  Left Ventricle: Left ventricular ejection fraction, by estimation, is 60 to 65%. The left ventricle has normal function. The left ventricle has no regional wall motion abnormalities. The left ventricular internal cavity size was normal in size. There is  no left ventricular hypertrophy. Left ventricular diastolic parameters are consistent with Grade I diastolic dysfunction (impaired relaxation). Normal left ventricular filling pressure. Right Ventricle: The right ventricular size is normal. No increase in right ventricular wall thickness. Right ventricular systolic function is normal. There is mildly elevated pulmonary artery systolic pressure. The tricuspid regurgitant velocity is 2.84  m/s, and with an assumed right atrial pressure of 8 mmHg, the estimated right ventricular systolic pressure is 0000000 mmHg. Left Atrium: Left atrial size was normal in size. Right Atrium: Right atrial size was normal in size. Pericardium: There is no evidence of pericardial effusion. Mitral Valve: The mitral valve is normal in structure. No evidence of mitral valve regurgitation. No evidence of mitral valve stenosis. Tricuspid Valve: The tricuspid valve is normal in structure. Tricuspid valve regurgitation is mild . No evidence of tricuspid stenosis. Aortic Valve: The aortic valve is normal in structure. Aortic valve regurgitation is not visualized. No aortic stenosis is present. Pulmonic Valve: The pulmonic valve was normal in structure. Pulmonic valve regurgitation is not visualized. No evidence of pulmonic stenosis. Aorta: The aortic root is normal in size and structure. Venous: IVC assessment for right atrial pressure unable to be performed due to mechanical ventilation. The inferior vena cava is normal in size with greater than 50% respiratory variability, suggesting right atrial pressure  of 3 mmHg. IAS/Shunts: No atrial level shunt detected by color flow Doppler.  LEFT VENTRICLE PLAX 2D LVIDd:         3.80 cm  Diastology LVIDs:         2.30 cm  LV e' medial:    6.31 cm/s LV PW:         0.90 cm  LV E/e' medial:  9.4 LV IVS:        0.90 cm  LV e' lateral:   9.14 cm/s LVOT diam:     2.00 cm  LV E/e' lateral: 6.5 LV SV:         54 LV SV Index:   32 LVOT Area:     3.14 cm  RIGHT VENTRICLE RV S prime:     12.90 cm/s LEFT ATRIUM             Index LA diam:        3.30 cm 1.93 cm/m LA Vol (A2C):   30.6 ml 17.88 ml/m  LA Vol (A4C):   47.0 ml 27.47 ml/m LA Biplane Vol: 40.8 ml 23.84 ml/m  AORTIC VALVE LVOT Vmax:   95.80 cm/s LVOT Vmean:  55.500 cm/s LVOT VTI:    0.172 m  AORTA Ao Root diam: 2.90 cm MITRAL VALVE               TRICUSPID VALVE MV Area (PHT): 5.54 cm    TR Peak grad:   32.3 mmHg MV Decel Time: 137 msec    TR Vmax:        284.00 cm/s MV E velocity: 59.60 cm/s MV A velocity: 87.00 cm/s  SHUNTS MV E/A ratio:  0.69        Systemic VTI:  0.17 m                            Systemic Diam: 2.00 cm Dani Gobble Croitoru MD Electronically signed by Sanda Klein MD Signature Date/Time: 09/01/2020/2:22:50 PM    Final    IR PERCUTANEOUS ART THROMBECTOMY/INFUSION INTRACRANIAL INC DIAG ANGIO  Result Date: 09/04/2020 INDICATION: Acute onset of right-sided weakness and numbness. Reportedly also had right facial numbness and right facial droop. CT angiogram of the head and neck revealed a left PCA occlusion. EXAM: 1. EMERGENT LARGE VESSEL OCCLUSION THROMBOLYSIS (POSTERIOR CIRCULATION) COMPARISON:  CT angiogram of the head and neck of September 01, 2020. MEDICATIONS: Ancef 2 g IV antibiotic was administered within 1 hour of the procedure. ANESTHESIA/SEDATION: General anesthesia CONTRAST:  Isovue 300 approximately 110 mL FLUOROSCOPY TIME:  Fluoroscopy Time: 66 minutes 24 seconds (2192mG y). COMPLICATIONS: None immediate. TECHNIQUE: Following a full explanation of the procedure along with the potential associated  complications, an informed witnessed consent was obtained. The risks of intracranial hemorrhage of 10%, worsening neurological deficit, ventilator dependency, death and inability to revascularize were all reviewed in detail with the patient. The patient was then put under general anesthesia by the Department of Anesthesiology at [facility]. The right groin was prepped and draped in the usual sterile fashion. Thereafter using modified Seldinger technique, transfemoral access into the right common femoral artery was obtained without difficulty. Over a 0.035 inch guidewire an 8 French 25 cm Pinnacle sheath was inserted. Through this, and also over a 0.035 inch guidewire a 5 [place] JB 1 catheter was advanced to the aortic arch region and selectively positioned in the left subclavian artery, and the right vertebral artery subclavian artery. FINDINGS: Left subclavian arteriogram demonstrates patency of the left vertebral artery, proximally associated with mild to moderate tortuosity more distally. Patency is maintained of the left vertebrobasilar junction and basilar artery and the left superior inferior cerebellar artery. Unopacified blood is seen in the basilar artery from the contralateral right vertebral artery. The right subclavian arteriogram demonstrates the right subclavian artery to be widely patent. The dominant right vertebral artery demonstrates patency proximally associated with moderate tortuosity. More distally the vessel is seen to opacify to the cranial skull base. Patency is seen of the right vertebrobasilar junction and the right posterior-inferior cerebellar artery. The basilar artery, the right posterior cerebral artery, the superior cerebellar arteries and the anterior-inferior cerebellar arteries opacify into the capillary and venous phases. Complete angiographic occlusion is seen of the left posterior cerebral artery mid P1 segment. PROCEDURE: The diagnostic JB 1 catheter in the more  distal right subclavian artery was then exchanged over a 0.035 inch 300 cm Rosen exchange guidewire for an 80 cm 8 [place] Neuron Max sheath. Good aspiration obtained  from the hub of the Neuron Max sheath following removal of the exchange Rosen guidewire. A control arteriogram performed through the Neuron Max sheath demonstrated no evidence of vasospasm, or of dissections. Through the Neuron Max sheath in the right subclavian artery, over a 0.035 inch Roadrunner guidewire, a 5 [place] JB 1 catheter was then advanced without difficulty to the distal right vertebral artery followed by the Neuron Max sheath. The guidewire was removed. Good aspiration obtained from the hub of the Neuron Max sheath with the distal end at the junction of the distal and the mid 1/3 of the dominant right vertebral artery. A gentle control arteriogram performed through this demonstrated no evidence of spasm, dissection or of intraluminal filling defects. Over a 0.14 inch standard Synchro micro guidewire, and inside an 055 130 cm Zoom aspiration catheter an 021 microcatheter combination was advanced to the distal basilar artery. The micro guidewire was then gently advanced through the occluded left posterior cerebral artery into the P2 P3 segment followed by the microcatheter. The micro guidewire was removed. Good aspiration obtained from the hub of the microcatheter. A gentle control arteriogram performed through the microcatheter demonstrated safe position of the tip of the microcatheter with excellent antegrade washout of the contrast. This was then connected to continuous heparinized saline infusion. A 3 mm x 40 mm Solitaire X retrieval device was then advanced to the distal end of the microcatheter and deployed by unsheathing. The 19 Zoom aspiration catheter was now advanced into the previously occluded left posterior cerebral artery. With constant aspiration being applied at the hub of the aspiration catheter, and at the hub of the Neuron  Max sheath, the combination of the retrieval device, and the microcatheter were retrieved and removed. The Zoom aspiration catheter was now retrieved to the distal right vertebrobasilar junction. Arteriogram performed through the 55 aspiration catheter now demonstrated complete revascularization of the left posterior cerebral artery achieving a TICI 3 revascularization. Also uncovered was approximately 50-60% stenosis at the site of the previous occlusion, indicative of intracranial arteriosclerosis. A post thrombectomy 10 minute arteriogram performed through the aspiration catheter now demonstrated the angiographic occlusion of the left posterior cerebral artery. At this time a CT of the brain demonstrated no evidence of intracranial hemorrhage. Over a 0.014 inch standard Synchro micro guidewire the 29 Zoom aspiration catheter, an 021 Trevo microcatheter was advanced to the proximal left posterior cerebral artery. Using a torque device, the micro guidewire was gently advanced without difficulty to the distal P2 P3 segment followed by the microcatheter. The guidewire was removed. Good aspiration was obtained from the hub of the microcatheter. Again gentle contrast injection demonstrated safe position of the tip of the microcatheter which was now connected to continuous heparinized saline infusion. The left posterior cerebral artery P1 to P2 segment was then measured in terms of length and also diameter. It was elected to proceed with placement of a 4.5 mm x 24 mm Neuroform Atlas stent for revascularization. Prior to this, the patient was loaded with a bolus dose of weight based Aggrastat IV followed by a 4 hour infusion. Patient was also given Brilinta 180 mg, and aspirin 81 mg via an orogastric tube. The 4.5 mm x 24 mm Atlas Neuroform stent was then advanced into the distal end of the microcatheter. The O ring on the delivery microcatheter was then loosened. The proximal and the distal landing zones were then  defined. Thereafter, with slight forward gentle traction with the right hand on the delivery micro guidewire with  the left hand the distal and then the proximal portion of the stent were deployed. A control arteriogram performed through the 55 aspiration catheter in the basilar artery again demonstrated reconstitution of a TICI 3 revascularization of the left posterior cerebral artery territory. Patency was also noted of the previously noted site of intracranial arteriosclerosis. The micro guidewire, and the micro guidewire were retrieved and removed. Control arteriogram were then performed at 10 and 20 minutes post stent deployment with steadily slow flow proximally and distally through the stent. Given the patient had already been loaded with dual antiplatelets as described above, Neuron Max sheath in the right vertebral artery was removed. The 8 French sheath was then replaced with an 8 French Angio-Seal closure device with hemostasis. Distal pulses continued to be patent by Doppler in both feet unchanged. CT of the brain performed in the CT suite demonstrated no evidence of hemorrhage or mass effect or midline shift. Patient was then transferred to the neuro ICU for post revascularization management. IMPRESSION: Status post endovascular complete revascularization of the left posterior cerebral artery P1 segment with 1 pass with a 3 mm x 40 mm Solitaire X retrieval device and contact aspiration achieving a TICI 3 revascularization. Status post rescue stenting of reocclusion with restoration of TICI 3 recanalization of the left posterior cerebral artery territory. Slow antegrade flow seen in the left posterior cerebral artery at the termination of the procedure. PLAN: Follow-up in clinic 2-4 weeks post discharge. Electronically Signed   By: Luanne Bras M.D.   On: 09/02/2020 14:48   CT HEAD CODE STROKE WO CONTRAST  Result Date: 09/01/2020 CLINICAL DATA:  Code stroke. Right-sided facial droop and weakness  since 10 p.m. EXAM: CT HEAD WITHOUT CONTRAST TECHNIQUE: Contiguous axial images were obtained from the base of the skull through the vertex without intravenous contrast. COMPARISON:  Brain MRI 08/23/2013 FINDINGS: Brain: Subtle low-density in the left temporal occipital cortex. No other visible infarct. No hemorrhage, hydrocephalus, or collection. Vascular: Atheromatous calcification. Especially on coronal imaging the left P 2 or P3 segment appears dense Skull: Normal. Negative for fracture or focal lesion. Sinuses/Orbits: Negative Other: These results were called by telephone at the time of interpretation on 09/01/2020 at 7:04 am to provider Dr Charna Archer, who verbally acknowledged these results. IMPRESSION: 1. Small acute infarct in the left occipital parietal cortex. The adjacent PCA is hyperdense on reformats, suggesting acute thrombosis. The visible infarct is smaller than would be expected for the level of vessel hyperdensity, consider CTA/perfusion if a treatment candidate. 2. No intracranial hemorrhage. Electronically Signed   By: Monte Fantasia M.D.   On: 09/01/2020 07:08   VAS [place] LOWER EXTREMITY VENOUS (DVT)  Result Date: 09/03/2020  Lower Venous DVT Study Indications: Pain.  Limitations: Bandages. Comparison Study: no prior Performing Technologist: Abram Sander RVS  Examination Guidelines: A complete evaluation includes B-mode imaging, spectral Doppler, color Doppler, and power Doppler as needed of all accessible portions of each vessel. Bilateral testing is considered an integral part of a complete examination. Limited examinations for reoccurring indications may be performed as noted. The reflux portion of the exam is performed with the patient in reverse Trendelenburg.  +---------+---------------+---------+-----------+----------+--------------+ RIGHT    CompressibilityPhasicitySpontaneityPropertiesThrombus Aging +---------+---------------+---------+-----------+----------+--------------+ CFV  bandages       +---------+---------------+---------+-----------+----------+--------------+ SFJ                                                   bandages       +---------+---------------+---------+-----------+----------+--------------+ FV Prox                                               bandages       +---------+---------------+---------+-----------+----------+--------------+ FV Mid   Full                                                        +---------+---------------+---------+-----------+----------+--------------+ FV DistalFull                                                        +---------+---------------+---------+-----------+----------+--------------+ PFV      Full                                                        +---------+---------------+---------+-----------+----------+--------------+ POP      Full           Yes      Yes                                 +---------+---------------+---------+-----------+----------+--------------+ PTV      Full                                                        +---------+---------------+---------+-----------+----------+--------------+ PERO     Full                                                        +---------+---------------+---------+-----------+----------+--------------+   +---------+---------------+---------+-----------+----------+--------------+ LEFT     CompressibilityPhasicitySpontaneityPropertiesThrombus Aging +---------+---------------+---------+-----------+----------+--------------+ CFV      Full           Yes      Yes                                 +---------+---------------+---------+-----------+----------+--------------+ SFJ      Full                                                        +---------+---------------+---------+-----------+----------+--------------+  FV Prox  Full                                                         +---------+---------------+---------+-----------+----------+--------------+ FV Mid   Full                                                        +---------+---------------+---------+-----------+----------+--------------+ FV DistalFull                                                        +---------+---------------+---------+-----------+----------+--------------+ PFV      Full                                                        +---------+---------------+---------+-----------+----------+--------------+ POP      Full           Yes      Yes                                 +---------+---------------+---------+-----------+----------+--------------+ PTV      Full                                                        +---------+---------------+---------+-----------+----------+--------------+ PERO     Full                                                        +---------+---------------+---------+-----------+----------+--------------+     Summary: BILATERAL: - No evidence of deep vein thrombosis seen in the lower extremities, bilaterally. - No evidence of superficial venous thrombosis in the lower extremities, bilaterally. -No evidence of popliteal cyst, bilaterally.   *See table(s) above for measurements and observations. Electronically signed by Harold Barban MD on 09/03/2020 at 9:26:38 PM.    Final        HISTORY OF PRESENT ILLNESS  "Deanna Williams is a 87 y.o. female past history of hypertension, hyperlipidemia, presented to the emergency room with right-sided numbness and weakness.  Her last known well was when she went to bed around 10 to 10:30 PM.  When she woke up this morning she noted that her right arm and leg are heavy and feel numb.  She also noted some weakness on the right side.  She was brought to the hospital for evaluation of those symptoms.  She also noticed some right facial numbness around her mouth and lips. Her symptoms of not  improved since arrival.  A code stroke was activated around 7 AM, seen by telemedicine neurology, found to have a small hypodensity in the left PCA territory, deemed to be outside the window for IV tPA and further recommendations for vessel imaging were given. I was called after the CT angiogram of the head and neck was completed that showed a left P2 occlusion for further recommendations. Patient was given aspirin 325 at 8:04 AM. I reexamined the patient-see my detailed exam below. Given that she is a modified Rankin score of 0 at baseline and has disabling symptoms, I discussed this with the neuro interventional radiology-Dr. Estanislado Pandy who suggested that we do a CT perfusion study to ensure that there is not a large score already established. CT perfusion study was completed with no core and a penumbra that included the left PCA territory as well as bilateral cerebelli of total 42 cc. Case discussed with neuro interventional radiology Dr. Estanislado Pandy again and the patient.  Endovascular thrombectomy option offered along with risk-benefit discussion and the patient agreed. Transfer made to [facility] accepting physician to her service." - Dr. Salome Spotted COURSE Patient underwent successful thrombectomy and stenting of Left PCA occlusion.Following procedure patient was extubated and passed her swallow study. Patient was started on ASA and Brilinta as well as Lipitor 20mg  for further CVA prevention. Patient home amlodipine also increased to 10mg  due to continued hypertension.  Patient A1c was 5.9 < 7.0. Patient neurological exam improved with only slight dysarthria and R sided extremity weakness. PT and OT continued to work with patient and reported that patient would be a good candidate for CIR. Etiology of stroke remains cryptogenic. VAS [place] for DVT was negative.  Patient echo was negative for structural abnormalities and LVEF was 60-65%.  Cardiology EP was consulted for loop  recorder placement day before discharge; however on review of telemetry patient was noted to have an episode of A fib. At their recommendation ASA was discontinued and patient was started on Eliquis with the Brilinta.   DISCHARGE EXAM Blood pressure (!) 150/79, pulse 69, temperature 98.1 F (36.7 C), temperature source Oral, resp. rate 20, weight 67.4 kg, SpO2 97 %. General- Well nourished, well developed, in no apparent distress.  Cardiovascular - Regular rate and rhythm.  Extremities: Ecchymosis noted from CBG fingerstick's in the fingers.   Mental Status-  Level of arousal and orientation to time, place, and person were intact. Patient talking on phone. Language including expression, naming,comprehension was assessed and found intact. Attention span and concentration were normal. Recent and remote memory were intact. Fund of Knowledge was assessed and was intact.  Cranial Nerves II - XII- II - Visual field intact,pupils 77mm PERRL III, IV, VI - Extraocular movements intact. V - Facial sensation intact bilaterally. VII - Facial movement intact bilaterally. VIII - Hearing & vestibular intact bilaterally. X - Palate elevates symmetrically. XI - shoulder shrug intact bilaterally. XII - Tongue protrusion intact.  Motor Strength -The patient's strengthLUE and LLE no drift(4/5). RUE (4-/5)and RLE (3-/5)with no drift   Motor Tone- Muscle tone was assessed at the neck and appendages and was normal.  Sensory-Light touch intact symmetrically.  Coordination-FTN intact bilaterally. Tremor was absent.  Gait and Station-deferred.   Discharge Diet      Diet   Diet Heart Room service appropriate? Yes; Fluid consistency: Thin   liquids  DISCHARGE PLAN  Disposition:  Transfer to [facility] for ongoing PT, OT and ST  Brilinta (ticagrelor) 90 mg bid and  Eliquis (apixaban) daily for secondary stroke prevention   Recommend ongoing stroke  risk factor control by Primary Care Physician at time of discharge from inpatient rehabilitation.  Follow-up PCP Jerrol Banana., MD in 2 weeks following discharge from rehab.  Follow-up in [facility] in 4 weeks following discharge from rehab, office to schedule an appointment.   20  minutes were spent preparing discharge.  Damita Dunnings, MD PGY-1

## 2020-09-05 NOTE — Plan of Care (Signed)
  Problem: Consults Goal: RH STROKE PATIENT EDUCATION Description: See Patient Education module for education specifics  Outcome: Progressing   Problem: RH SKIN INTEGRITY Goal: RH STG SKIN FREE OF INFECTION/BREAKDOWN Description: Supervision assist Outcome: Progressing Goal: RH STG MAINTAIN SKIN INTEGRITY WITH ASSISTANCE Description: STG Maintain Skin Integrity With min Assistance. Outcome: Progressing   Problem: RH SAFETY Goal: RH STG ADHERE TO SAFETY PRECAUTIONS W/ASSISTANCE/DEVICE Description: STG Adhere to Safety Precautions With supervision Assistance/Device. Outcome: Progressing   Problem: RH COGNITION-NURSING Goal: RH STG USES MEMORY AIDS/STRATEGIES W/ASSIST TO PROBLEM SOLVE Description: STG Uses Memory Aids/Strategies With min Assistance to Problem Solve. Outcome: Progressing Goal: RH STG ANTICIPATES NEEDS/CALLS FOR ASSIST W/ASSIST/CUES Description: STG Anticipates Needs/Calls for Assist With supervision Assistance/Cues. Outcome: Progressing   Problem: RH KNOWLEDGE DEFICIT Goal: RH STG INCREASE KNOWLEDGE OF HYPERTENSION Description: Patient will be able to manage HTN with medications and dietary modifications using handouts and educational tools with cues/supervision assist  Outcome: Progressing Goal: RH STG INCREASE KNOWLEGDE OF HYPERLIPIDEMIA Description: Patient will be able to manage HLD with medications and dietary modifications using handouts and educational tools with cues/supervision assist Outcome: Progressing Goal: RH STG INCREASE KNOWLEDGE OF STROKE PROPHYLAXIS Description: Patient will be able to manage secondary stroke risks with medications and dietary modifications using handouts and educational tools with cues/reminders assist  Outcome: Progressing

## 2020-09-05 NOTE — H&P (Signed)
Physical Medicine and Rehabilitation Admission H&P        Chief Complaint  Patient presents with  . Functional deficits due to stroke      HPI: Deanna Williams is an 87 year old female with history of HTN, GERD, Lichen sclerosus who was admitted on 09/01/20 with right sided weakness and numbness. She was found to have left occipital parietal cortex infarct with hyperdense signal suggesting acute thrombosis. She underwent cerebral angio with thrombectomy and rescue stent for reocclusion by Dr. Estanislado Pandy. Follow up MRI brain showed L-PCA ischemia involving occipital, medial temporal and thalamus. 2 D echo showed EF 60-65% with no wall abnormality.Dr. Erlinda Hong recommended Loop placement due to concerns of embolic source of unknown source and she was found to have episode of A fib on 04/22. Eliquis recommended by Dr. Curt Bears with plans to follow up in office. She continues to have balance deficits, difficulty with higher level cognitive tasks as well as  weakness affecting ADLs and mobility. CIR recommended due to functional decline.      Review of Systems  Constitutional: Negative for chills and fever.  HENT: Negative for hearing loss and tinnitus.   Eyes: Negative for blurred vision and double vision.  Respiratory: Negative for cough and shortness of breath.   Gastrointestinal: Positive for constipation. Negative for heartburn and nausea.  Genitourinary: Negative for dysuria and urgency.  Musculoskeletal: Negative for myalgias and neck pain.  Skin: Negative for rash.  Neurological: Positive for sensory change and focal weakness.  Psychiatric/Behavioral: The patient is not nervous/anxious and does not have insomnia.             Past Medical History:  Diagnosis Date  . Gastroesophageal reflux disease    . Hyperlipidemia    . Hypertension    . Lichen sclerosus    . Lichen sclerosus et atrophicus    . Osteopenia    . Palpitations        Past Surgical History:  Procedure Laterality  Date  . CARPAL TUNNEL RELEASE      . FOOT SURGERY      . HYSTEROSCOPY WITH D & C N/A 11/02/2015    Procedure: DILATATION AND CURETTAGE /HYSTEROSCOPY;  Surgeon: Brayton Mars, MD;  Location: [facility];  Service: Gynecology;  Laterality: N/A;  . IR CT HEAD LTD   09/01/2020  . IR INTRA CRAN STENT   09/01/2020  . IR PERCUTANEOUS ART THROMBECTOMY/INFUSION INTRACRANIAL INC DIAG ANGIO   09/01/2020  . RADIOLOGY WITH ANESTHESIA N/A 09/01/2020    Procedure: IR WITH ANESTHESIA;  Surgeon: Radiologist, Medication, MD;  Location: [place];  Service: Radiology;  Laterality: N/A;           Family History  Problem Relation Age of Onset  . Cancer Mother    . Leukemia Mother    . Cancer Father          colon  . Hypertension Father    . Arthritis Sister    . Breast cancer Neg Hx        Social History:  Widowed. Used to work I a [employer]. Lives in [place] and active PTA. She reports that she has never smoked. She has never used smokeless tobacco. She reports current alcohol use--mixed drink occasionally. She reports that she does not use drugs.     Allergies: No Known Allergies      Medications Prior to Admission  Medication Sig Dispense Refill  . conjugated estrogens (PREMARIN) vaginal cream Place  1 Applicatorful vaginally daily. (Patient not taking: Reported on 09/01/2020) 42.5 g 12  . omeprazole (PRILOSEC) 20 MG capsule Take 1 capsule (20 mg total) by mouth daily. (Patient not taking: Reported on 09/01/2020) 30 capsule 3  . terconazole (TERAZOL 3) 0.8 % vaginal cream Place 1 applicator vaginally at bedtime. (Patient not taking: No sig reported) 20 g 3      Drug Regimen Review  Drug regimen was reviewed and remains appropriate with no significant issues identified   Home: Home Living Family/patient expects to be discharged to:: Private residence Living Arrangements: Alone Available Help at Discharge: Family Type of Home: Independent living facility Home Access: Level entry Home Layout: One  level Bathroom Shower/Tub: Multimedia programmer: Handicapped height Home Equipment: Grab bars - toilet,Grab bars - tub/shower,Hand held shower head,None Additional Comments: Pt lives in a retirement community with her yorkie  Lives With: Alone   Functional History: Prior Function Level of Independence: Independent Comments: drives, grocery shops, cooks, walks her dog   Functional Status:  Mobility: Bed Mobility Overal bed mobility: Needs Assistance Bed Mobility: Supine to Sit,Sit to Supine Supine to sit: Supervision,HOB elevated Sit to supine: Supervision,HOB elevated General bed mobility comments: supervision for safety Transfers Overall transfer level: Needs assistance Equipment used: Rolling walker (2 wheeled) Transfers: Sit to/from Stand Sit to Stand: Min guard,+2 safety/equipment Stand pivot transfers: Mod assist,+2 physical assistance,+2 safety/equipment General transfer comment: minguard +2 for safety to rise from EOB and recliner, good hand placement noted Ambulation/Gait Ambulation/Gait assistance: +2 safety/equipment,Mod assist Gait Distance (Feet): 40 Feet Assistive device: Rolling walker (2 wheeled) Gait Pattern/deviations: Step-through pattern,Decreased step length - right,Decreased step length - left,Decreased stride length,Decreased weight shift to right,Decreased weight shift to left,Decreased dorsiflexion - right,Trunk flexed General Gait Details: noted R ankle instability with ambulation requiring blocking to prevent inversion. Leans R at times with progression. Cues for increasing step length on the R Gait velocity: decreased Gait velocity interpretation: <1.31 ft/sec, indicative of household ambulator   ADL: ADL Overall ADL's : Needs assistance/impaired Eating/Feeding: Modified independent,Sitting,Bed level Grooming: Wash/dry face,Oral care,Wash/dry hands,Brushing hair,Standing,Supervision/safety,Min guard Grooming Details (indicate cue type and  reason): standign at sink with supervision- min guard assist Upper Body Bathing: Minimal assistance,Sitting Upper Body Bathing Details (indicate cue type and reason): for sitting balance Lower Body Bathing: Moderate assistance,Sit to/from stand Upper Body Dressing : Minimal assistance,Sitting Upper Body Dressing Details (indicate cue type and reason): to don posterior gown Lower Body Dressing: Minimal assistance,Sit to/from stand,Supervision/safety,Set up Lower Body Dressing Details (indicate cue type and reason): pt requires assist to don R sock but able to don shoes and doff socks from supervision- set- up assist. MINA  to don breif needing assist to pull brief up to waist line Toilet Transfer: Min guard,RW,Ambulation,+2 for safety/equipment Toilet Transfer Details (indicate cue type and reason): simulated via functional mobility with Rw and MIN guard assist +2 for safety Toileting- Clothing Manipulation and Hygiene: Moderate assistance,+2 for physical assistance,+2 for safety/equipment,Sit to/from stand Toileting - Clothing Manipulation Details (indicate cue type and reason): Pt leans heavily to the Rt Functional mobility during ADLs: Min guard,+2 for safety/equipment,Rolling walker General ADL Comments: pt continues to present with decreased activity tolerance, impaired balance, and generalized deconditioning   Cognition: Cognition Overall Cognitive Status: Impaired/Different from baseline Arousal/Alertness: Awake/alert Orientation Level: Oriented X4 Attention: Focused,Sustained,Alternating Focused Attention: Appears intact Sustained Attention: Impaired Sustained Attention Impairment: Verbal complex,Functional complex Alternating Attention: Impaired Alternating Attention Impairment: Verbal complex,Functional complex Memory: Impaired Memory Impairment: Decreased recall of new information,Decreased  short term memory Decreased Short Term Memory: Verbal complex Awareness:  Impaired Problem Solving: Impaired Problem Solving Impairment: Verbal complex,Functional complex Executive Function: Organizing,Self Monitoring Organizing: Impaired Organizing Impairment: Verbal complex,Functional complex Self Monitoring: Impaired Self Monitoring Impairment: Verbal complex,Functional complex Safety/Judgment: Impaired Cognition Arousal/Alertness: Awake/alert Behavior During Therapy: WFL for tasks assessed/performed Overall Cognitive Status: Impaired/Different from baseline Area of Impairment: Attention,Following commands,Awareness,Problem solving,Orientation Orientation Level: Disoriented to,Place (asking COTA if she is from [place]( with pt thinking she was in the hospital [place])) Current Attention Level: Selective Following Commands: Follows one step commands consistently,Follows multi-step commands with increased time Awareness: Emergent Problem Solving: Slow processing,Difficulty sequencing,Requires verbal cues,Requires tactile cues General Comments: pt slightly disoriented to location thinking we were in [place]   Physical Exam: Blood pressure 127/70, pulse 79, temperature 98.2 F (36.8 C), temperature source Oral, resp. rate 19, weight 67.4 kg, SpO2 97 %. Physical Exam Vitals and nursing note reviewed.  Constitutional:      Appearance: Normal appearance.  HENT:     Head: Normocephalic and atraumatic.     Right Ear: External ear normal.     Left Ear: External ear normal.     Nose: Nose normal.     Mouth/Throat:     Mouth: Mucous membranes are moist.     Pharynx: Oropharynx is clear.  Eyes:     Extraocular Movements: Extraocular movements intact.     Pupils: Pupils are equal, round, and reactive to light.  Cardiovascular:     Rate and Rhythm: Normal rate and regular rhythm.     Pulses: Normal pulses.     Heart sounds: No murmur heard. No gallop.   Pulmonary:     Effort: Pulmonary effort is normal. No respiratory distress.     Breath sounds: No  wheezing.  Abdominal:     General: Bowel sounds are normal. There is no distension.     Palpations: Abdomen is soft.     Tenderness: There is no abdominal tenderness.  Musculoskeletal:        General: Swelling (LUE) present.     Cervical back: Normal range of motion.  Skin:    General: Skin is warm.     Findings: Bruising present.  Neurological:     Mental Status: She is alert and oriented to person, place, and time.     Comments: Speech clear and follows simple motor commands without difficulty. No focal CN abnl.  RUE 4/5. LUE 4+/5. RLE 3/5 prox to 4/5 distally.  LLE 3+/5 prox to 4+/5 distally. Stocking glove sensory loss from knees to feet, also hands. Feet most affected. DTR's 1+. No gross limb ataxia. No resting tone.  Psychiatric:        Mood and Affect: Mood normal.        Behavior: Behavior normal.        Thought Content: Thought content normal.        Lab Results Last 48 Hours        Results for orders placed or performed during the hospital encounter of 09/01/20 (from the past 48 hour(s))  Basic metabolic panel     Status: Abnormal    Collection Time: 09/03/20  2:25 AM  Result Value Ref Range    Sodium 137 135 - 145 mmol/L    Potassium 4.0 3.5 - 5.1 mmol/L    Chloride 108 98 - 111 mmol/L    CO2 23 22 - 32 mmol/L    Glucose, Bld 114 (H) 70 - 99 mg/dL  Comment: Glucose reference range applies only to samples taken after fasting for at least 8 hours.    BUN 11 8 - 23 mg/dL    Creatinine, Ser 0.71 0.44 - 1.00 mg/dL    Calcium 8.5 (L) 8.9 - 10.3 mg/dL    GFR, Estimated >60 >60 mL/min      Comment: (NOTE) Calculated using the CKD-EPI Creatinine Equation (2021)      Anion gap 6 5 - 15      Comment: Performed at [facility] [address], [place], [place] [address]  CBC     Status: None    Collection Time: 09/03/20  2:25 AM  Result Value Ref Range    WBC 8.2 4.0 - 10.5 K/uL    RBC 4.04 3.87 - 5.11 MIL/uL    Hemoglobin 12.6 12.0 - 15.0 g/dL    HCT 38.2  36.0 - 46.0 %    MCV 94.6 80.0 - 100.0 fL    MCH 31.2 26.0 - 34.0 pg    MCHC 33.0 30.0 - 36.0 g/dL    RDW 12.5 11.5 - 15.5 %    Platelets 221 150 - 400 K/uL    nRBC 0.0 0.0 - 0.2 %      Comment: Performed at [facility], [place] [address] [place], [place] [address]  Glucose, capillary     Status: Abnormal    Collection Time: 09/03/20  1:00 PM  Result Value Ref Range    Glucose-Capillary 133 (H) 70 - 99 mg/dL      Comment: Glucose reference range applies only to samples taken after fasting for at least 8 hours.  Glucose, capillary     Status: Abnormal    Collection Time: 09/03/20  4:19 PM  Result Value Ref Range    Glucose-Capillary 109 (H) 70 - 99 mg/dL      Comment: Glucose reference range applies only to samples taken after fasting for at least 8 hours.    Comment 1 Notify RN      Comment 2 Document in Chart    Glucose, capillary     Status: Abnormal    Collection Time: 09/04/20  3:17 AM  Result Value Ref Range    Glucose-Capillary 119 (H) 70 - 99 mg/dL      Comment: Glucose reference range applies only to samples taken after fasting for at least 8 hours.  Basic metabolic panel     Status: Abnormal    Collection Time: 09/04/20  5:56 AM  Result Value Ref Range    Sodium 135 135 - 145 mmol/L    Potassium 3.9 3.5 - 5.1 mmol/L    Chloride 105 98 - 111 mmol/L    CO2 22 22 - 32 mmol/L    Glucose, Bld 119 (H) 70 - 99 mg/dL      Comment: Glucose reference range applies only to samples taken after fasting for at least 8 hours.    BUN 11 8 - 23 mg/dL    Creatinine, Ser 0.71 0.44 - 1.00 mg/dL    Calcium 8.7 (L) 8.9 - 10.3 mg/dL    GFR, Estimated >60 >60 mL/min      Comment: (NOTE) Calculated using the CKD-EPI Creatinine Equation (2021)      Anion gap 8 5 - 15      Comment: Performed at [facility] [address], [place], [place] [address]  CBC     Status: None    Collection Time: 09/04/20  5:56 AM  Result Value Ref Range    WBC 6.7 4.0 - 10.5 K/uL    RBC 4.06 3.87  - 5.11 MIL/uL    Hemoglobin 12.7 12.0 - 15.0 g/dL    HCT 38.5 36.0 - 46.0 %    MCV 94.8 80.0 - 100.0 fL    MCH 31.3 26.0 - 34.0 pg    MCHC 33.0 30.0 - 36.0 g/dL    RDW 12.3 11.5 - 15.5 %    Platelets 221 150 - 400 K/uL    nRBC 0.0 0.0 - 0.2 %      Comment: Performed at [facility], [place] [address], [place], [place] [address]  Magnesium     Status: None    Collection Time: 09/04/20  5:56 AM  Result Value Ref Range    Magnesium 2.1 1.7 - 2.4 mg/dL      Comment: Performed at [facility] [address] [place], [place] [address]  Glucose, capillary     Status: Abnormal    Collection Time: 09/04/20  1:03 PM  Result Value Ref Range    Glucose-Capillary 109 (H) 70 - 99 mg/dL      Comment: Glucose reference range applies only to samples taken after fasting for at least 8 hours.    Comment 1 Notify RN      Comment 2 Document in Chart         Imaging Results (Last 48 hours)  VAS [place] LOWER EXTREMITY VENOUS (DVT)   Result Date: 09/03/2020  Lower Venous DVT Study Indications: Pain.  Limitations: Bandages. Comparison Study: no prior Performing Technologist: Abram Sander RVS  Examination Guidelines: A complete evaluation includes B-mode imaging, spectral Doppler, color Doppler, and power Doppler as needed of all accessible portions of each vessel. Bilateral testing is considered an integral part of a complete examination. Limited examinations for reoccurring indications may be performed as noted. The reflux portion of the exam is performed with the patient in reverse Trendelenburg.  +---------+---------------+---------+-----------+----------+--------------+ RIGHT    CompressibilityPhasicitySpontaneityPropertiesThrombus Aging +---------+---------------+---------+-----------+----------+--------------+ CFV                                                   bandages       +---------+---------------+---------+-----------+----------+--------------+ SFJ                                                    bandages       +---------+---------------+---------+-----------+----------+--------------+ FV Prox                                               bandages       +---------+---------------+---------+-----------+----------+--------------+ FV Mid   Full                                                        +---------+---------------+---------+-----------+----------+--------------+ FV DistalFull                                                        +---------+---------------+---------+-----------+----------+--------------+  PFV      Full                                                        +---------+---------------+---------+-----------+----------+--------------+ POP      Full           Yes      Yes                                 +---------+---------------+---------+-----------+----------+--------------+ PTV      Full                                                        +---------+---------------+---------+-----------+----------+--------------+ PERO     Full                                                        +---------+---------------+---------+-----------+----------+--------------+   +---------+---------------+---------+-----------+----------+--------------+ LEFT     CompressibilityPhasicitySpontaneityPropertiesThrombus Aging +---------+---------------+---------+-----------+----------+--------------+ CFV      Full           Yes      Yes                                 +---------+---------------+---------+-----------+----------+--------------+ SFJ      Full                                                        +---------+---------------+---------+-----------+----------+--------------+ FV Prox  Full                                                        +---------+---------------+---------+-----------+----------+--------------+ FV Mid   Full                                                         +---------+---------------+---------+-----------+----------+--------------+ FV DistalFull                                                        +---------+---------------+---------+-----------+----------+--------------+ PFV      Full                                                        +---------+---------------+---------+-----------+----------+--------------+  POP      Full           Yes      Yes                                 +---------+---------------+---------+-----------+----------+--------------+ PTV      Full                                                        +---------+---------------+---------+-----------+----------+--------------+ PERO     Full                                                        +---------+---------------+---------+-----------+----------+--------------+     Summary: BILATERAL: - No evidence of deep vein thrombosis seen in the lower extremities, bilaterally. - No evidence of superficial venous thrombosis in the lower extremities, bilaterally. -No evidence of popliteal cyst, bilaterally.   *See table(s) above for measurements and observations. Electronically signed by Harold Barban MD on 09/03/2020 at 9:26:38 PM.    Final              Medical Problem List and Plan: 1.  Functional deficits and mild right hemiparesis secondary to left PCA infarct             -patient may shower             -ELOS/Goals: 7-10 days, supervision to mod I with PT and OT 2.  Antithrombotics: -DVT/anticoagulation:  Pharmaceutical: Other (comment)--Eliquis             -antiplatelet therapy: ASA/Brilinta 3. Pain Management: N/A 4. Mood: LCSW to follow for evaluation and support.              -antipsychotic agents: N/A 5. Neuropsych: This patient is capable of making decisions on her own behalf. 6. Skin/Wound Care: Routine pressure relief measures 7. Fluids/Electrolytes/Nutrition: Monitor I/O. Check lytes in am. 8. HTN: Monitor BP TID--continue Norvasc  daily 9. A fib: New diagnosis--monitor HR TID--now on Eliquis.              -HR controlled, regular on admit 10. Prediabetes: Hgb A1C-5.9. Add CM restrictions and educate patient on CM diet.  11. L-PCA stent: On ASA, Brilinta and Lipitor.              Bary Leriche, PA-C 09/04/2020  I have personally performed a face to face diagnostic evaluation of this patient and formulated the key components of the plan.  Additionally, I have personally reviewed laboratory data, imaging studies, as well as relevant notes and concur with the physician assistant's documentation above.  The patient's status has not changed from the original H&P.  Any changes in documentation from the acute care chart have been noted above.  Meredith Staggers, MD, Mell[facility] Drown

## 2020-09-05 NOTE — Progress Notes (Addendum)
Inpatient Rehabilitation Medication Review by a Pharmacist  A complete drug regimen review was completed for this patient to identify any potential clinically significant medication issues.  Clinically significant medication issues were identified:  yes    Name of provider notified for urgent issues identified: Dr. Naaman Plummer and Dr. Ranell Patrick   Pharmacist comments: Kary Kos not yet resumed from hospital discharge, have entered order with cosign requirement  Time spent performing this drug regimen review (minutes):  10 minutes   Tad Moore 09/05/2020 3:10 PM   ADDENDUM: Apixaban 5 mg reordered for next dose this PM. Per inpatient RN did not give AM dose this morning because had stat order to discontinue (I do not see this on the [facility]). Sent secure chat to Dr. Naaman Plummer and Reesa Chew to recommend to give Apixaban 5mg  dose now and evening dose as scheduled.   Per Dr. Naaman Plummer okay to give one time dose now and then next dose as scheduled tonight.   Fara Olden, PharmD PGY-1 Pharmacy Resident 09/05/2020 3:17 PM Please see AMION for all pharmacy numbers

## 2020-09-06 DIAGNOSIS — I63532 Cerebral infarction due to unspecified occlusion or stenosis of left posterior cerebral artery: Secondary | ICD-10-CM | POA: Diagnosis not present

## 2020-09-06 DIAGNOSIS — I10 Essential (primary) hypertension: Secondary | ICD-10-CM | POA: Diagnosis not present

## 2020-09-06 NOTE — Plan of Care (Signed)
  Problem: RH Balance Goal: LTG Patient will maintain dynamic standing balance (PT) Description: LTG:  Patient will maintain dynamic standing balance with assistance during mobility activities (PT) Flowsheets (Taken 09/06/2020 1236) LTG: Pt will maintain dynamic standing balance during mobility activities with:: Independent with assistive device    Problem: Sit to Stand Goal: LTG:  Patient will perform sit to stand with assistance level (PT) Description: LTG:  Patient will perform sit to stand with assistance level (PT) Flowsheets (Taken 09/06/2020 1236) LTG: PT will perform sit to stand in preparation for functional mobility with assistance level: Independent with assistive device   Problem: RH Bed Mobility Goal: LTG Patient will perform bed mobility with assist (PT) Description: LTG: Patient will perform bed mobility with assistance, with/without cues (PT). Flowsheets (Taken 09/06/2020 1236) LTG: Pt will perform bed mobility with assistance level of: Independent   Problem: RH Bed to Chair Transfers Goal: LTG Patient will perform bed/chair transfers w/assist (PT) Description: LTG: Patient will perform bed to chair transfers with assistance (PT). Flowsheets (Taken 09/06/2020 1236) LTG: Pt will perform Bed to Chair Transfers with assistance level: Independent with assistive device    Problem: RH Car Transfers Goal: LTG Patient will perform car transfers with assist (PT) Description: LTG: Patient will perform car transfers with assistance (PT). Flowsheets (Taken 09/06/2020 1236) LTG: Pt will perform car transfers with assist:: Independent with assistive device    Problem: RH Ambulation Goal: LTG Patient will ambulate in controlled environment (PT) Description: LTG: Patient will ambulate in a controlled environment, # of feet with assistance (PT). Flowsheets (Taken 09/06/2020 1236) LTG: Pt will ambulate in controlled environ  assist needed:: Independent with assistive device LTG: Ambulation  distance in controlled environment: 150 ft with LRAD Goal: LTG Patient will ambulate in home environment (PT) Description: LTG: Patient will ambulate in home environment, # of feet with assistance (PT). Flowsheets (Taken 09/06/2020 1236) LTG: Pt will ambulate in home environ  assist needed:: Independent with assistive device LTG: Ambulation distance in home environment: 75 ft with LRAD Goal: LTG Patient will ambulate in community environment (PT) Description: LTG: Patient will ambulate in community environment, # of feet with assistance (PT). Flowsheets (Taken 09/06/2020 1236) LTG: Pt will ambulate in community environ  assist needed:: Independent with assistive device LTG: Ambulation distance in community environment: 300 ft with LRAD

## 2020-09-06 NOTE — Evaluation (Signed)
Physical Therapy Assessment and Plan  Patient Details  Name: LEDIA HANFORD MRN: [id number] Date of Birth: [birth year]  PT Diagnosis: Abnormal posture, Abnormality of gait and Hemiparesis dominant Rehab Potential: Excellent ELOS: 7-10 days   Today's Date: 09/06/2020 PT Individual Time: 0900-1000 PT Individual Time Calculation (min): 60 min    Hospital Problem: Principal Problem:   Acute ischemic left PCA stroke [facility])   Past Medical History:  Past Medical History:  Diagnosis Date  . Gastroesophageal reflux disease   . Hyperlipidemia   . Hypertension   . Lichen sclerosus   . Lichen sclerosus et atrophicus   . Osteopenia   . Palpitations    Past Surgical History:  Past Surgical History:  Procedure Laterality Date  . CARPAL TUNNEL RELEASE    . FOOT SURGERY    . HYSTEROSCOPY WITH D & C N/A 11/02/2015   Procedure: DILATATION AND CURETTAGE /HYSTEROSCOPY;  Surgeon: Brayton Mars, MD;  Location: [facility] ORS;  Service: Gynecology;  Laterality: N/A;  . IR CT HEAD LTD  09/01/2020  . IR INTRA CRAN STENT  09/01/2020  . IR PERCUTANEOUS ART THROMBECTOMY/INFUSION INTRACRANIAL INC DIAG ANGIO  09/01/2020  . RADIOLOGY WITH ANESTHESIA N/A 09/01/2020   Procedure: IR WITH ANESTHESIA;  Surgeon: Radiologist, Medication, MD;  Location: [place];  Service: Radiology;  Laterality: N/A;    Assessment & Plan Clinical Impression:  Simaya Lumadue is an 87 year old female with history of HTN, GERD, Lichen sclerosus who was admitted on 09/01/20 with right sided weakness and numbness. She was found to have left occipital parietal cortex infarct with hyperdense signal suggesting acute thrombosis. She underwent cerebral angio with thrombectomy and rescue stent for reocclusion by Dr. Estanislado Pandy. Follow up MRI brain showed L-PCA ischemia involving occipital, medial temporal and thalamus. 2 D echo showed EF 60-65% with no wall abnormality.Dr. Erlinda Hong recommended Loop placement due to concerns of embolic source of unknown  source and she was found to have episode of A fib on 04/22. Eliquis recommended by Dr. Curt Bears with plans to follow up in office. She continues to have balance deficits, difficulty with higher level cognitive tasks as well as weakness affecting ADLs and mobility. CIR recommended due to functional decline. Patient transferred to CIR on 09/05/2020 .   Patient currently requires min with mobility secondary to muscle weakness, decreased cardiorespiratoy endurance and decreased standing balance, decreased postural control, hemiplegia and decreased balance strategies.  Prior to hospitalization, patient was independent  with mobility and lived with Alone in a Independent living facility home.  Home access is  Level entry.  Patient will benefit from skilled PT intervention to maximize safe functional mobility, minimize fall risk and decrease caregiver burden for planned discharge home with intermittent assist.  Anticipate patient will benefit from follow up [facility] at discharge.  PT - End of Session Activity Tolerance: Tolerates 30+ min activity with multiple rests Endurance Deficit: Yes Endurance Deficit Description: fatigued after ambulation PT Assessment Rehab Potential (ACUTE/IP ONLY): Excellent PT Patient demonstrates impairments in the following area(s): Balance;Endurance;Motor;Safety;Sensory PT Transfers Functional Problem(s): Bed Mobility;Bed to Chair;Car;Furniture;Floor PT Locomotion Functional Problem(s): Ambulation;Wheelchair Mobility;Stairs PT Plan PT Intensity: Minimum of 1-2 x/day ,45 to 90 minutes PT Frequency: 5 out of 7 days PT Duration Estimated Length of Stay: 7-10 days PT Treatment/Interventions: Ambulation/gait training;Balance/vestibular training;Community reintegration;Discharge planning;Disease management/prevention;DME/adaptive equipment instruction;Functional mobility training;Neuromuscular re-education;Patient/family education;Stair training;Therapeutic Activities;Therapeutic  Exercise;UE/LE Strength taining/ROM;UE/LE Coordination activities;Wheelchair propulsion/positioning PT Transfers Anticipated Outcome(s): mod I PT Locomotion Anticipated Outcome(s): mod I with LRAD PT Recommendation Recommendations  for Other Services: Therapeutic Recreation consult Therapeutic Recreation Interventions: Stress management Follow Up Recommendations: Home health PT Patient destination: Home Equipment Details: TBD pending progress   PT Evaluation Precautions/Restrictions Precautions Precautions: Fall Restrictions Weight Bearing Restrictions: No Home Living/Prior Functioning Home Living Available Help at Discharge: Family Type of Home: Independent living facility Home Access: Level entry Home Layout: One level Additional Comments: Pt lives in a retirement community with her yorkie  Lives With: Alone Prior Function Level of Independence: Independent with gait;Independent with transfers  Able to Take Stairs?: Yes Driving: Yes Vocation: Retired Comments: drives, grocery shops, cooks, walks her dog Vision/Perception  Vision - History Baseline Vision: Wears glasses all the time Patient Visual Report: Diplopia Perception Perception: Within Functional Limits Praxis Praxis: Intact  Cognition Overall Cognitive Status: Within Functional Limits for tasks assessed Arousal/Alertness: Awake/alert Orientation Level: Oriented X4 Attention: Focused;Sustained Focused Attention: Appears intact Sustained Attention: Appears intact Memory: Appears intact Awareness: Appears intact Problem Solving: Appears intact Safety/Judgment: Appears intact Sensation Sensation Light Touch: Impaired Detail Light Touch Impaired Details: Impaired RLE;Impaired LLE (reports tingling in BLE and around her mouth) Proprioception: Impaired Detail Proprioception Impaired Details: Impaired RLE Coordination Gross Motor Movements are Fluid and Coordinated: No Fine Motor Movements are Fluid and  Coordinated: Yes Coordination and Movement Description: mild R hemi Motor  Motor Motor: Hemiplegia;Abnormal postural alignment and control Motor - Skilled Clinical Observations: mild R hemi  Trunk/Postural Assessment  Cervical Assessment Cervical Assessment: Exceptions to [facility] (forward head) Thoracic Assessment Thoracic Assessment: Exceptions to [facility] (kyphotic) Lumbar Assessment Lumbar Assessment: Exceptions to [facility] (posterior pelvic tilt) Postural Control Postural Control: Deficits on evaluation Righting Reactions: delayed  Balance Balance Balance Assessed: Yes Static Sitting Balance Static Sitting - Balance Support: No upper extremity supported;Feet supported Static Sitting - Level of Assistance: 6: Modified independent (Device/Increase time) Dynamic Sitting Balance Dynamic Sitting - Balance Support: Feet supported;No upper extremity supported;During functional activity Dynamic Sitting - Level of Assistance: 5: Stand by assistance Static Standing Balance Static Standing - Balance Support: Bilateral upper extremity supported;During functional activity Static Standing - Level of Assistance: 5: Stand by assistance;4: Min assist Dynamic Standing Balance Dynamic Standing - Balance Support: Bilateral upper extremity supported;During functional activity Dynamic Standing - Level of Assistance: 4: Min assist Extremity Assessment   RLE Assessment RLE Assessment: Exceptions to [facility] General Strength Comments: see below RLE Strength Right Hip Flexion: 3+/5 Right Knee Flexion: 4/5 Right Knee Extension: 5/5 Right Ankle Dorsiflexion: 5/5 LLE Assessment LLE Assessment: Within Functional Limits General Strength Comments: 5/5 grossly  Care Tool Care Tool Bed Mobility Roll left and right activity   Roll left and right assist level: Independent    Sit to lying activity   Sit to lying assist level: Supervision/Verbal cueing    Lying to sitting edge of bed activity   Lying to sitting edge  of bed assist level: Supervision/Verbal cueing     Care Tool Transfers Sit to stand transfer   Sit to stand assist level: Contact Guard/Touching assist    Chair/bed transfer   Chair/bed transfer assist level: Contact Guard/Touching assist     Toilet transfer   Assist Level: Contact Guard/Touching assist    Car transfer   Car transfer assist level: Contact Guard/Touching assist      Care Tool Locomotion Ambulation   Assist level: Contact Guard/Touching assist Assistive device: Walker-rolling Max distance: 150'  Walk 10 feet activity   Assist level: Contact Guard/Touching assist Assistive device: Walker-rolling   Walk 50 feet with 2 turns activity  Assist level: Contact Guard/Touching assist Assistive device: Walker-rolling  Walk 150 feet activity   Assist level: Contact Guard/Touching assist Assistive device: Walker-rolling  Walk 10 feet on uneven surfaces activity Walk 10 feet on uneven surfaces activity did not occur: Safety/medical concerns      Stairs Stair activity did not occur: Safety/medical concerns        Walk up/down 1 step activity Walk up/down 1 step or curb (drop down) activity did not occur: Safety/medical concerns     Walk up/down 4 steps activity did not occuR: Safety/medical concerns  Walk up/down 4 steps activity      Walk up/down 12 steps activity Walk up/down 12 steps activity did not occur: Safety/medical concerns      Pick up small objects from floor Pick up small object from the floor (from standing position) activity did not occur: Safety/medical concerns      Wheelchair Will patient use wheelchair at discharge?: No          Wheel 50 feet with 2 turns activity      Wheel 150 feet activity        Refer to Care Plan for Long Term Goals  SHORT TERM GOAL WEEK 1 PT Short Term Goal 1 (Week 1): =LTG due to ELOS  Recommendations for other services: Therapeutic Recreation  Stress management  Skilled Therapeutic  Intervention Evaluation completed (see details above and below) with education on PT POC and goals and individual treatment initiated with focus on functional transfer and gait assessment as well as orientation to rehab unit and schedule. Pt received seated in recliner in room, agreeable to PT evaluation. No complaints of pain. Sit to stand with CGA with and without RW throughout session. Toilet transfer with CGA with use of RW, pt Supervision for standing balance for pericare and clothing management. Ambulation x 150 ft with use of RW and CGA, cues to keep RW closer to her body for improved balance. Pt reports feeling very fatigued following gait, requires seated rest break. Car transfer with CGA and RW with cues for safe transfer technique. Pt left seated in w/c in room with needs in reach, chair alarm in place at end of session.  Mobility Bed Mobility Bed Mobility: Rolling Right;Rolling Left;Supine to Sit;Sit to Supine Rolling Right: Independent Rolling Left: Independent Supine to Sit: Supervision/Verbal cueing Sit to Supine: Supervision/Verbal cueing Transfers Transfers: Sit to Stand;Stand Pivot Transfers Sit to Stand: Contact Guard/Touching assist Stand Pivot Transfers: Contact Guard/Touching assist Transfer (Assistive device): Rolling walker Locomotion  Gait Gait Distance (Feet): 150 Feet Assistive device: Rolling walker Gait Gait Pattern: Within Functional Limits Gait velocity: decreased Stairs / Additional Locomotion Stairs: No Wheelchair Mobility Wheelchair Mobility: No   Discharge Criteria: Patient will be discharged from PT if patient refuses treatment 3 consecutive times without medical reason, if treatment goals not met, if there is a change in medical status, if patient makes no progress towards goals or if patient is discharged from hospital.  The above assessment, treatment plan, treatment alternatives and goals were discussed and mutually agreed upon: by  patient   Excell Seltzer, PT, DPT, CSRS 09/06/2020, 12:29 PM

## 2020-09-06 NOTE — Evaluation (Signed)
Speech Language Pathology Assessment and Plan  Patient Details  Name: Deanna Williams MRN: [id number] Date of Birth: [birth year]  SLP Diagnosis: Cognitive Impairments  Rehab Potential: Excellent ELOS: 7-10 days    Today's Date: 09/06/2020 SLP Individual Time: 1300-1355 SLP Individual Time Calculation (min): 65 min   Hospital Problem: Principal Problem:   Acute ischemic left PCA stroke [facility])  Past Medical History:  Past Medical History:  Diagnosis Date  . Gastroesophageal reflux disease   . Hyperlipidemia   . Hypertension   . Lichen sclerosus   . Lichen sclerosus et atrophicus   . Osteopenia   . Palpitations    Past Surgical History:  Past Surgical History:  Procedure Laterality Date  . CARPAL TUNNEL RELEASE    . FOOT SURGERY    . HYSTEROSCOPY WITH D & C N/A 11/02/2015   Procedure: DILATATION AND CURETTAGE /HYSTEROSCOPY;  Surgeon: Brayton Mars, MD;  Location: [facility] ORS;  Service: Gynecology;  Laterality: N/A;  . IR CT HEAD LTD  09/01/2020  . IR INTRA CRAN STENT  09/01/2020  . IR PERCUTANEOUS ART THROMBECTOMY/INFUSION INTRACRANIAL INC DIAG ANGIO  09/01/2020  . RADIOLOGY WITH ANESTHESIA N/A 09/01/2020   Procedure: IR WITH ANESTHESIA;  Surgeon: Radiologist, Medication, MD;  Location: [place];  Service: Radiology;  Laterality: N/A;    Assessment / Plan / Recommendation Clinical Impression   Deanna Williams is an 87 year old female with history of HTN, GERD, Lichen sclerosus who was admitted on 09/01/20 with right sided weakness and numbness. She was found to have left occipital parietal cortex infarct with hyperdense signal suggesting acute thrombosis. She underwent cerebral angio with thrombectomy and rescue stent for reocclusion by Dr. Estanislado Pandy. Follow up MRI brain showed L-PCA ischemia involving occipital, medial temporal and thalamus. 2 D echo showed EF 60-65% with no wall abnormality.Dr. Erlinda Hong recommended Loop placement due to concerns of embolic source of unknown source and  she was found to have episode of A fib on 04/22. Eliquis recommended by Dr. Curt Bears with plans to follow up in office. She continues to have balance deficits, difficulty with higher level cognitive tasks as well as weakness affecting ADLs and mobility. CIR recommended due to functional decline. SLP evaluation was completed on 09/06/2020 with results as follows:  Pt presents with mild higher level cognitive deficits in the areas of memory and complex problem solving as verified by scores on the Cognistat (2 on the constructional ability subtest, 7 on the delayed recall subtest).  Functionally, pt's presentation is near her baseline per report; however, pt stated that the delayed recall subtest was harder than expected.  Her speech was fluent and free from dysarthria or word finding impairment and she denies any changes in swallowing function.  Given pt's high level of independence prior to admission, she would benefit from skilled ST while inpatient in order to address the deficits mentioned above although I do not anticipate her needing ST follow up post discharge.     Skilled Therapeutic Interventions          Cognitive-linguistic evaluation completed with results and recommendations reviewed with family.     SLP Assessment  Patient will need skilled [place] [facility] during CIR admission    Recommendations  Patient destination: Home Follow up Recommendations: None Equipment Recommended: None recommended by SLP    SLP Frequency 1 to 3 out of 7 days   SLP Duration  SLP Intensity  SLP Treatment/Interventions 7-10 days  Minumum of 1-2 x/day, 30 to 90  minutes  Cognitive remediation/compensation;Cueing hierarchy;Environmental controls;Patient/family education;Internal/external aids    Pain Pain Assessment Pain Scale: 0-10 Pain Score: 0-No pain  Prior Functioning Cognitive/Linguistic Baseline: Within functional limits Type of Home: Independent living facility  Lives  With: Alone Available Help at Discharge: Family Education: high school Vocation: Retired  Programmer, systems Overall Cognitive Status: Impaired/Different from baseline Arousal/Alertness: Awake/alert Orientation Level: Oriented X4 Attention: Selective Focused Attention: Appears intact Sustained Attention: Appears intact Selective Attention: Appears intact Memory: Impaired Memory Impairment: Retrieval deficit Awareness: Appears intact Problem Solving: Impaired Problem Solving Impairment: Functional complex Safety/Judgment: Appears intact  Comprehension Auditory Comprehension Overall Auditory Comprehension: Appears within functional limits for tasks assessed Expression Expression Primary Mode of Expression: Verbal Verbal Expression Overall Verbal Expression: Appears within functional limits for tasks assessed Written Expression Dominant Hand: Left Oral Motor Oral Motor/Sensory Function Overall Oral Motor/Sensory Function: Within functional limits Motor Speech Overall Motor Speech: Appears within functional limits for tasks assessed  Care Tool Care Tool Cognition Expression of Ideas and Wants Expression of Ideas and Wants: Without difficulty (complex and basic) - expresses complex messages without difficulty and with speech that is clear and easy to understand   Understanding Verbal and Non-Verbal Content Understanding Verbal and Non-Verbal Content: Understands (complex and basic) - clear comprehension without cues or repetitions   Memory/Recall Ability *first 3 days only Memory/Recall Ability *first 3 days only: Current season;Location of own room;Staff names and faces;That he or she is in a hospital/hospital unit        Short Term Goals: Week 1: SLP Short Term Goal 1 (Week 1): STG=LTG due to ELOS  Refer to Care Plan for Long Term Goals  Recommendations for other services: None   Discharge Criteria: Patient will be discharged from SLP if patient refuses  treatment 3 consecutive times without medical reason, if treatment goals not met, if there is a change in medical status, if patient makes no progress towards goals or if patient is discharged from hospital.  The above assessment, treatment plan, treatment alternatives and goals were discussed and mutually agreed upon: by patient  Deanna Williams 09/06/2020, 2:05 PM

## 2020-09-06 NOTE — Progress Notes (Signed)
PROGRESS NOTE   Subjective/Complaints: Had a restful night. Anxious to get moving with therapy today!  ROS: Patient denies fever, rash, sore throat, blurred vision, nausea, vomiting, diarrhea, cough, shortness of breath or chest pain, joint or back pain, headache, or mood change.    Objective:   No results found. Recent Labs    09/04/20 0556 09/05/20 0314  WBC 6.7 6.7  HGB 12.7 12.9  HCT 38.5 38.2  PLT 221 225   Recent Labs    09/04/20 0556 09/05/20 0314  NA 135 136  K 3.9 4.3  CL 105 106  CO2 22 23  GLUCOSE 119* 128*  BUN 11 13  CREATININE 0.71 0.74  CALCIUM 8.7* 9.2    Intake/Output Summary (Last 24 hours) at 09/06/2020 0916 Last data filed at 09/06/2020 0710 Gross per 24 hour  Intake 413 ml  Output --  Net 413 ml        Physical Exam: Vital Signs Blood pressure 126/83, pulse 72, temperature 97.9 F (36.6 C), temperature source Oral, resp. rate 14, height 5\' 2"  (1.575 m), weight 66.2 kg, SpO2 95 %.   General: Alert and oriented x 3, No apparent distress HEENT: Head is normocephalic, atraumatic, PERRLA, EOMI, sclera anicteric, oral mucosa pink and moist, dentition intact, ext ear canals clear,  Neck: Supple without JVD or lymphadenopathy Heart: Reg rate and rhythm. No murmurs rubs or gallops Chest: CTA bilaterally without wheezes, rales, or rhonchi; no distress Abdomen: Soft, non-tender, non-distended, bowel sounds positive. Extremities: No clubbing, cyanosis, or edema. Pulses are 2+ Psych: Pt's affect is appropriate. Pt is cooperative Skin: Clean and intact without signs of breakdown Neuro: Alert and oriented x 3. Normal insight and awareness. Intact Memory. Normal language and speech. Cranial nerve exam unremarkable. RUE 4/5 prox to distal. RLE 3-4/5 prox to distal. LUE 4+/5. LLE 3+ to 4/5. Stocking glove sensory loss from knees to feet and bilateral hadns. DTR's 1+. No focal limb ataxia. No abnormal  tone Musculoskeletal: Full ROM, No pain with AROM or PROM in the neck, trunk, or extremities. Posture appropriate    Assessment/Plan: 1. Functional deficits which require 3+ hours per day of interdisciplinary therapy in a comprehensive inpatient rehab setting.  Physiatrist is providing close team supervision and 24 hour management of active medical problems listed below.  Physiatrist and rehab team continue to assess barriers to discharge/monitor patient progress toward functional and medical goals  Care Tool:  Bathing              Bathing assist       Upper Body Dressing/Undressing Upper body dressing        Upper body assist      Lower Body Dressing/Undressing Lower body dressing            Lower body assist       Toileting Toileting    Toileting assist       Transfers Chair/bed transfer  Transfers assist           Locomotion Ambulation   Ambulation assist              Walk 10 feet activity   Assist  Walk 50 feet activity   Assist           Walk 150 feet activity   Assist           Walk 10 feet on uneven surface  activity   Assist           Wheelchair     Assist               Wheelchair 50 feet with 2 turns activity    Assist            Wheelchair 150 feet activity     Assist          Blood pressure 126/83, pulse 72, temperature 97.9 F (36.6 C), temperature source Oral, resp. rate 14, height 5\' 2"  (1.575 m), weight 66.2 kg, SpO2 95 %.  Medical Problem List and Plan: 1.Functional deficits and mild right hemiparesissecondary to left PCA infarct -patient may shower -ELOS/Goals: 7-10 days, supervision to mod I with PT and OT  -Patient is beginning CIR therapies today including PT and OT  2. Antithrombotics: -DVT/anticoagulation:Pharmaceutical:Other (comment)--Eliquis -antiplatelet therapy: ASA/Brilinta 3. Pain  Management:N/A 4. Mood:LCSW to follow for evaluation and support. -antipsychotic agents: N/A 5. Neuropsych: This patientiscapable of making decisions on herown behalf. 6. Skin/Wound Care:Routine pressure relief measures 7. Fluids/Electrolytes/Nutrition:Monitor I/O. Check lytes Monday. 8. HTN: Monitor BP TID--continue Norvasc daily 9. A fib: New diagnosis--monitor HR TID--now on Eliquis. -HR controlled 4/24 10. Prediabetes: Hgb A1C-5.9. Add CM restrictions and educate patient on CM diet.  11. L-PCA stent: On ASA, Brilinta and Lipitor.    LOS: 1 days A FACE TO FACE EVALUATION WAS PERFORMED  Meredith Staggers 09/06/2020, 9:16 AM

## 2020-09-06 NOTE — Plan of Care (Signed)
  Problem: RH Balance Goal: LTG Patient will maintain dynamic standing with ADLs (OT) Description: LTG:  Patient will maintain dynamic standing balance with assist during activities of daily living (OT)  Flowsheets (Taken 09/06/2020 1501) LTG: Pt will maintain dynamic standing balance during ADLs with: Independent with assistive device   Problem: RH Dressing Goal: LTG Patient will perform lower body dressing w/assist (OT) Description: LTG: Patient will perform lower body dressing with assist, with/without cues in positioning using equipment (OT) Flowsheets (Taken 09/06/2020 1501) LTG: Pt will perform lower body dressing with assistance level of: Independent with assistive device   Problem: RH Toileting Goal: LTG Patient will perform toileting task (3/3 steps) with assistance level (OT) Description: LTG: Patient will perform toileting task (3/3 steps) with assistance level (OT)  Flowsheets (Taken 09/06/2020 1501) LTG: Pt will perform toileting task (3/3 steps) with assistance level: Independent with assistive device   Problem: RH Simple Meal Prep Goal: LTG Patient will perform simple meal prep w/assist (OT) Description: LTG: Patient will perform simple meal prep with assistance, with/without cues (OT). Flowsheets (Taken 09/06/2020 1501) LTG: Pt will perform simple meal prep with assistance level of: Independent with assistive device   Problem: RH Laundry Goal: LTG Patient will perform laundry w/assist, cues (OT) Description: LTG: Patient will perform laundry with assistance, with/without cues (OT). Flowsheets (Taken 09/06/2020 1501) LTG: Pt will perform laundry with assistance level of: Independent with assistive device   Problem: RH Light Housekeeping Goal: LTG Patient will perform light housekeeping w/assist (OT) Description: LTG: Patient will perform light housekeeping with assistance, with/without cues (OT). Flowsheets (Taken 09/06/2020 1501) LTG: Pt will perform light housekeeping  with assistance level of: Independent with assistive device   Problem: RH Toilet Transfers Goal: LTG Patient will perform toilet transfers w/assist (OT) Description: LTG: Patient will perform toilet transfers with assist, with/without cues using equipment (OT) Flowsheets (Taken 09/06/2020 1501) LTG: Pt will perform toilet transfers with assistance level of: Independent with assistive device   Problem: RH Tub/Shower Transfers Goal: LTG Patient will perform tub/shower transfers w/assist (OT) Description: LTG: Patient will perform tub/shower transfers with assist, with/without cues using equipment (OT) Flowsheets (Taken 09/06/2020 1501) LTG: Pt will perform tub/shower stall transfers with assistance level of: Independent with assistive device   Problem: RH Memory Goal: LTG Patient will demonstrate ability for day to day recall/carry over during activities of daily living with assistance level (OT) Description: LTG:  Patient will demonstrate ability for day to day recall/carry over during activities of daily living with assistance level (OT). Flowsheets (Taken 09/06/2020 1501) LTG:  Patient will demonstrate ability for day to day recall/carry over during activities of daily living with assistance level (OT): Modified Independent

## 2020-09-06 NOTE — Progress Notes (Signed)
PMR Admission Coordinator Pre-Admission Assessment  Patient: Deanna Williams is an 87 y.o., female MRN: [id number] DOB: 10/17/1928 Height:   Weight: 72.3 kg                                                                                                                                                  Insurance Information HMO: Yes    PPO:       PCP:       IPA:       80/20:       OTHER:   PRIMARY: [employer]      Policy#: [id number]      Subscriber: patient CM Name: Christinia Gully      Phone#: [phone]   Fax#: [phone] Pre cert # [id number] Christinia Gully from [employer] called with approval on 09/04/20 for admission 09/04/20 for 7 days.  Pre-Cert#: [id number]    Employer: Retired Benefits:  Phone #: [phone]     Name: Mateo Flow. Date: 05/16/14     Deduct: $0      Out of Pocket Max: $3450 (met $93.04)      Life Max: N/A  CIR: $325 days 1-6      SNF: $0 days 1-20; $184 days 21-100 Outpatient: medical necessity     Co-Pay: $15/visit copay Home Health: 80%      Co-Pay: 20% DME: 80%     Co-Pay: 20%  Providers: in network  SECONDARY:        Policy#:        Phone#:    Development worker, community:        Phone#:    The Engineer, petroleum" for patients in Inpatient Rehabilitation Facilities with attached "Privacy Act [place] Records" was provided and verbally reviewed with: Patient and Family  Emergency Contact Information         Contact Information    Name Relation Home Work Mobile   Brame,Maynard Son [phone]     Amesquita,Pam Other [phone]       Current Medical History  Patient Admitting Diagnosis: CVA  History of Present Illness: Deanna Sterling Hardingis a 87 y.o.femalewith history of HTN, palpitations who was admitted via [facility] on 09/01/20 with right sided weakness with numbness. CT head showed small acute infarct in left occipital parietal cortex with hyperdense signal suggesting acute thrombosis. She underwent cerebral angio  with thrombectomy and rescue stent for reocclusion by Dr. Estanislado Pandy. MRI brain done for follow up and revealed L-PCA ischemia involving occipital, medial temporal and thalamus. 2D echo showed EF 60-65% with no wall abnormality and plans for loop recorder in am. Therapy evaluations in progress and patient limited by weakness with mild cognitive deficits. CIR recommended due to functional decline. Left hand edematous and mildly painful.   Complete NIHSS TOTAL: 2 Glasgow Coma Scale Score: 15  Past Medical History  Past Medical History:  Diagnosis Date  . Gastroesophageal reflux disease   . Hyperlipidemia   . Hypertension   . Lichen sclerosus   . Lichen sclerosus et atrophicus   . Osteopenia   . Palpitations     Family History  family history includes Arthritis in her sister; Cancer in her father and mother; Hypertension in her father; Leukemia in her mother.  Prior Rehab/Hospitalizations:  Has the patient had prior rehab or hospitalizations prior to admission? No  Has the patient had major surgery during 100 days prior to admission? No  Current Medications   Current Facility-Administered Medications:  .  acetaminophen (TYLENOL) tablet 650 mg, 650 mg, Oral, Q4H PRN, 650 mg at 09/03/20 1102 **OR** acetaminophen (TYLENOL) 160 MG/5ML solution 650 mg, 650 mg, Per Tube, Q4H PRN **OR** acetaminophen (TYLENOL) suppository 650 mg, 650 mg, Rectal, Q4H PRN, Deveshwar, Sanjeev, MD .  amLODipine (NORVASC) tablet 10 mg, 10 mg, Oral, Daily, Chand, Sudham, MD, 10 mg at 09/03/20 1102 .  aspirin chewable tablet 81 mg, 81 mg, Oral, Daily, 81 mg at 09/03/20 1102 **OR** aspirin chewable tablet 81 mg, 81 mg, Per Tube, Daily, Deveshwar, Sanjeev, MD .  atorvastatin (LIPITOR) tablet 20 mg, 20 mg, Oral, Daily, Rosalin Hawking, MD, 20 mg at 09/03/20 1101 .  Chlorhexidine Gluconate Cloth 2 % PADS 6 each, 6 each, Topical, Daily, Rosalin Hawking, MD, 6 each at 09/03/20 1103 .  docusate sodium (COLACE)  capsule 100 mg, 100 mg, Oral, BID PRN, Magdalen Spatz, NP .  enoxaparin (LOVENOX) injection 40 mg, 40 mg, Subcutaneous, Q24H, Chand, Sudham, MD, 40 mg at 09/03/20 1103 .  labetalol (NORMODYNE) injection 5-10 mg, 5-10 mg, Intravenous, Q2H PRN, Rosalin Hawking, MD .  nitroGLYCERIN 25 mcg in sodium chloride (PF) 0.9 % 59.71 mL (25 mcg/mL) syringe, , Intra-arterial, PRN, Luanne Bras, MD, 25 mcg at 09/01/20 1036 .  ondansetron (ZOFRAN) injection 4 mg, 4 mg, Intravenous, Q6H PRN, Elie Confer, Sarah F, NP .  polyethylene glycol (MIRALAX / GLYCOLAX) packet 17 g, 17 g, Oral, Daily PRN, Magdalen Spatz, NP .  senna-docusate (Senokot-S) tablet 1 tablet, 1 tablet, Oral, QHS PRN, Rosalin Hawking, MD .  ticagrelor [facility]) tablet 90 mg, 90 mg, Oral, BID, 90 mg at 09/03/20 1103 **OR** ticagrelor (BRILINTA) tablet 90 mg, 90 mg, Per Tube, BID, Deveshwar, Sanjeev, MD, 90 mg at 09/01/20 2101  Patients Current Diet:     Diet Order                  Diet Heart Room service appropriate? Yes; Fluid consistency: Thin  Diet effective now                  Precautions / Restrictions Precautions Precautions: Fall Precaution Comments: heavy Rt lateral lean Restrictions Weight Bearing Restrictions: No   Has the patient had 2 or more falls or a fall with injury in the past year?No  Prior Activity Level Community (5-7x/wk): Went out daily.  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  Prior Functional Level Prior Function Level of Independence: Independent Comments: drives, grocery shops, cooks, walks her dog  Self Care: Did the patient need help bathing, dressing, using the toilet or eating?  Independent  Indoor Mobility: Did the patient need assistance with walking from room to room (with or without device)? Independent  Stairs: Did the patient need assistance with internal or external stairs (with or without device)? Independent  Functional Cognition: Did the patient need help planning  regular tasks such  as shopping or remembering to take medications? Independent  Home Assistive Devices / Equipment Home Assistive Devices/Equipment: None Home Equipment: Grab bars - toilet,Grab bars - tub/shower,Hand held shower head,None  Prior Device Use: Indicate devices/aids used by the patient prior to current illness, exacerbation or injury? None of the above  Current Functional Level Cognition  Arousal/Alertness: Awake/alert Overall Cognitive Status: Impaired/Different from baseline Current Attention Level: Selective Orientation Level: Oriented X4 Following Commands: Follows one step commands consistently,Follows multi-step commands inconsistently,Follows multi-step commands with increased time General Comments: Pt with poor awareness of deficits and impact on function, needing repeated education on deficits and cause. Attention: Focused,Sustained,Alternating Focused Attention: Appears intact Sustained Attention: Impaired Sustained Attention Impairment: Verbal complex,Functional complex Alternating Attention: Impaired Alternating Attention Impairment: Verbal complex,Functional complex Memory: Impaired Memory Impairment: Decreased recall of new information,Decreased short term memory Decreased Short Term Memory: Verbal complex Awareness: Impaired Problem Solving: Impaired Problem Solving Impairment: Verbal complex,Functional complex Executive Function: Organizing,Self Monitoring Organizing: Impaired Organizing Impairment: Verbal complex,Functional complex Self Monitoring: Impaired Self Monitoring Impairment: Verbal complex,Functional complex Safety/Judgment: Impaired    Extremity Assessment (includes Sensation/Coordination)  Upper Extremity Assessment: Defer to OT evaluation RUE Deficits / Details: mild dysmetria RUE Coordination: decreased fine motor,decreased gross motor  Lower Extremity Assessment: RLE deficits/detail RLE Deficits / Details: Decreased strength  compared to L noted with functional mobility, primarily in gluts, quads, and peroneals. RLE Sensation: decreased proprioception RLE Coordination: decreased fine motor,decreased gross motor    ADLs  Overall ADL's : Needs assistance/impaired Eating/Feeding: Modified independent,Sitting,Bed level Grooming: Wash/dry hands,Wash/dry face,Oral care,Brushing hair,Set up,Supervision/safety,Sitting Grooming Details (indicate cue type and reason): looses balance to the Rt Upper Body Bathing: Minimal assistance,Sitting Upper Body Bathing Details (indicate cue type and reason): for sitting balance Lower Body Bathing: Moderate assistance,Sit to/from stand Upper Body Dressing : Moderate assistance,Sitting Lower Body Dressing: Moderate assistance,Sit to/from stand Lower Body Dressing Details (indicate cue type and reason): Pt looses balance to the Rt with poor awareness and with decreased righting reaction.  She requires mod A for sitting balance.  Assist to pull socks over her feet Toilet Transfer: Moderate assistance,+2 for physical assistance,+2 for safety/equipment,Stand-pivot,BSC,RW Toilet Transfer Details (indicate cue type and reason): assist for balance and walker safety Toileting- Clothing Manipulation and Hygiene: Moderate assistance,+2 for physical assistance,+2 for safety/equipment,Sit to/from stand Toileting - Clothing Manipulation Details (indicate cue type and reason): Pt leans heavily to the Rt Functional mobility during ADLs: Moderate assistance,+2 for physical assistance,+2 for safety/equipment,Rolling walker    Mobility  Overal bed mobility: Needs Assistance Bed Mobility: Supine to Sit Supine to sit: Min guard General bed mobility comments: min guard for safety    Transfers  Overall transfer level: Needs assistance Equipment used: Rolling walker (2 wheeled) Transfers: Sit to/from Stand Sit to Stand: Min assist Stand pivot transfers: Mod assist,+2 physical assistance,+2  safety/equipment General transfer comment: minA for boost up. Cues for hand placement. Sit to stand x 4    Ambulation / Gait / Stairs / Wheelchair Mobility  Ambulation/Gait Ambulation/Gait assistance: +2 safety/equipment,Mod assist Gait Distance (Feet): 40 Feet Assistive device: Rolling walker (2 wheeled) Gait Pattern/deviations: Step-through pattern,Decreased step length - right,Decreased step length - left,Decreased stride length,Decreased weight shift to right,Decreased weight shift to left,Decreased dorsiflexion - right,Trunk flexed General Gait Details: noted R ankle instability with ambulation requiring blocking to prevent inversion. Leans R at times with progression. Cues for increasing step length on the R Gait velocity: decreased Gait velocity interpretation: <1.31 ft/sec, indicative of household ambulator    Posture / Balance  Dynamic Sitting Balance Sitting balance - Comments: close supervision for safety Balance Overall balance assessment: Needs assistance Sitting-balance support: Feet supported,Single extremity supported Sitting balance-Leahy Scale: Fair Sitting balance - Comments: close supervision for safety Postural control: Right lateral lean Standing balance support: Bilateral upper extremity supported Standing balance-Leahy Scale: Poor Standing balance comment: reliant on UE support and external assist    Special needs/care consideration Skin Echhymosis L arm, surgical incision      Previous Home Environment (from acute therapy documentation) Living Arrangements: Alone  Lives With: Alone Available Help at Discharge: Family Type of Home: Independent living facility Home Layout: One level Home Access: Level entry Bathroom Shower/Tub: Multimedia programmer: Handicapped [place]: No Additional Comments: Pt lives in a retirement community with her yorkie  Discharge Living Setting Plans for Discharge Living Setting: [place]  (Lives in [facility]) Type of Home at Discharge: House Discharge Home Layout: One level Discharge Home Access: Level entry Discharge Bathroom Shower/Tub: Tub/shower unit,Walk-in shower,Curtain Discharge Bathroom Toilet: Handicapped height Discharge Bathroom Accessibility: Yes How Accessible: Accessible via wheelchair,Accessible via walker Does the patient have any problems obtaining your medications?: No  Social/Family/Support Systems Patient Roles: Parent,Other (Comment) (Has son, dtr in law and grand daughter.) Contact Information: Conita Amenta - son - [phone]- Anticipated Caregiver: great grand daughter or can hire help Ability/Limitations of Caregiver: Pt.'s granddaughter in law, Deairra Halleck to come and stay with Pt. In her ILF apartment. Her number is [phone] Caregiver Availability: 24/7 Discharge Plan Discussed with Primary Caregiver: Yes Is Caregiver In Agreement with Plan?: Yes Does Caregiver/Family have Issues with Lodging/Transportation while Pt is in Rehab?: No  Goals Patient/Family Goal for Rehab: PT/OT supervision to mod I goals Expected length of stay: 7-10 days Cultural Considerations: none Pt/Family Agrees to Admission and willing to participate: Yes Program Orientation Provided & Reviewed with Pt/Caregiver Including Roles  & Responsibilities: Yes  Decrease burden of Care through IP rehab admission: N/A  Possible need for SNF placement upon discharge: Not anticipated  Patient Condition: This patient's medical and functional status has changed since the consult dated: 09/02/20 in which the Rehabilitation Physician determined and documented that the patient's condition is appropriate for intensive rehabilitative care in an inpatient rehabilitation facility. See "History of Present Illness" (above) for medical update. Functional changes are:Pt. Min A with transfer,s Mod A+2 for 40 ft of gait.. Patient's medical and functional status  update has been discussed with the Rehabilitation physician and patient remains appropriate for inpatient rehabilitation. Will admit to inpatient rehab 09/05/2020.  Preadmission Screen Completed By:  Retta Diones, RN, 09/03/2020 3:15 PM ______________________________________________________________________   Discussed status with Dr. Naaman Plummer on 09/04/20 at 11:00am and received approval for admission 09/05/2020.

## 2020-09-06 NOTE — Progress Notes (Addendum)
Physical Medicine and Rehabilitation Consult   Reason for Consult: Stroke with functional deficits Referring Physician: Dr. Erlinda Hong    HPI: Deanna Williams is a 87 y.o. female with history of HTN, palpitations who was admitted via [facility] on 09/01/20 with right sided weakness with numbness.  CT head showed small acute infarct in left occipital parietal cortex with hyperdense signal suggesting acute thrombosis. She underwent cerebral angio with thrombectomy and rescue stent for reocclusion by Dr. Estanislado Pandy. MRI brain done for follow up and revealed L-PCA ischemia involving occipital, medial temporal and thalamus. 2D echo showed EF 60-65% with no wall abnormality and plans for loop recorder in am. Therapy evaluations in progress and patient limited by weakness with mild cognitive deficits. CIR recommended due to functional decline. Left hand edematous and mildly painful.    Review of Systems  Constitutional: Negative for fever.  HENT: Negative for hearing loss.   Eyes: Positive for blurred vision (wears glasses).  Respiratory: Negative for cough and shortness of breath.   Cardiovascular: Negative for chest pain and palpitations.  Gastrointestinal: Negative for constipation, heartburn and nausea.  Genitourinary: Negative for dysuria and urgency.  Musculoskeletal: Negative for joint pain and myalgias.  Skin: Negative for rash.  Neurological: Positive for sensory change and focal weakness. Negative for dizziness and headaches.  Psychiatric/Behavioral: Negative for memory loss.          Past Medical History:  Diagnosis Date  . Gastroesophageal reflux disease   . Hyperlipidemia   . Hypertension   . Lichen sclerosus   . Lichen sclerosus et atrophicus   . Osteopenia   . Palpitations          Past Surgical History:  Procedure Laterality Date  . CARPAL TUNNEL RELEASE    . FOOT SURGERY    . HYSTEROSCOPY WITH D & C N/A 11/02/2015   Procedure: DILATATION AND  CURETTAGE /HYSTEROSCOPY;  Surgeon: Brayton Mars, MD;  Location: [facility];  Service: Gynecology;  Laterality: N/A;  . RADIOLOGY WITH ANESTHESIA N/A 09/01/2020   Procedure: IR WITH ANESTHESIA;  Surgeon: Radiologist, Medication, MD;  Location: [place];  Service: Radiology;  Laterality: N/A;    Family History  Problem Relation Age of Onset  . Cancer Mother   . Leukemia Mother   . Cancer Father        colon  . Hypertension Father   . Arthritis Sister   . Breast cancer Neg Hx     Social History:  Widowed. Used to work in a [employer]. She has a house at [facility] facility and was active PTA (cooks, drives other residents to MD offices). She reports that she has never smoked. She has never used smokeless tobacco. She reports current alcohol use--a mixed drink occasionally. She reports that she does not use drugs.    Allergies: No Known Allergies          Medications Prior to Admission  Medication Sig Dispense Refill  . conjugated estrogens (PREMARIN) vaginal cream Place 1 Applicatorful vaginally daily. (Patient not taking: Reported on 09/01/2020) 42.5 g 12  . omeprazole (PRILOSEC) 20 MG capsule Take 1 capsule (20 mg total) by mouth daily. (Patient not taking: Reported on 09/01/2020) 30 capsule 3  . terconazole (TERAZOL 3) 0.8 % vaginal cream Place 1 applicator vaginally at bedtime. (Patient not taking: No sig reported) 20 g 3    Home: Home Living Family/patient expects to be discharged to:: Private residence Living Arrangements: Alone Available Help at  Discharge: Family Type of Home: Independent living facility Home Access: Level entry Home Layout: One level Bathroom Shower/Tub: Multimedia programmer: Handicapped height Home Equipment: Grab bars - toilet,Grab bars - tub/shower,Hand held shower head,None Additional Comments: Pt lives in a retirement community with her yorkie  Lives With: Alone  Functional History: Prior Function Level of  Independence: Independent Comments: drives, grocery shops, cooks, walks her dog Functional Status:  Mobility: Bed Mobility Overal bed mobility: Needs Assistance Bed Mobility: Supine to Sit Supine to sit: Min assist General bed mobility comments: assist for balance Transfers Overall transfer level: Needs assistance Equipment used: 2 person hand held assist,Rolling walker (2 wheeled) Transfers: Sit to/from [place] to Stand: Mod assist,+2 physical assistance,+2 safety/equipment Stand pivot transfers: Mod assist,+2 physical assistance,+2 safety/equipment General transfer comment: Pt with heavy Rt lateral lean with Rt knee buckling.  She requires facilitation for Rt knee and hip extension. Pt also with R ankle instability, needing intermittent blocking to prevent inversion injury. Cues for hand placement with use of RW. Ambulation/Gait Ambulation/Gait assistance: Mod assist,+2 physical assistance,+2 safety/equipment Gait Distance (Feet): 3 Feet Assistive device: Rolling walker (2 wheeled) Gait Pattern/deviations: Step-through pattern,Decreased step length - right,Decreased step length - left,Decreased stride length,Decreased weight shift to right,Decreased weight shift to left,Decreased dorsiflexion - right,Trunk flexed (leans R)  ADL: ADL Overall ADL's : Needs assistance/impaired Eating/Feeding: Modified independent,Sitting,Bed level Grooming: Wash/dry hands,Wash/dry face,Oral care,Brushing hair,Set up,Supervision/safety,Sitting Grooming Details (indicate cue type and reason): looses balance to the Rt Upper Body Bathing: Minimal assistance,Sitting Upper Body Bathing Details (indicate cue type and reason): for sitting balance Lower Body Bathing: Moderate assistance,Sit to/from stand Upper Body Dressing : Moderate assistance,Sitting Lower Body Dressing: Moderate assistance,Sit to/from stand Lower Body Dressing Details (indicate cue type and reason): Pt looses  balance to the Rt with poor awareness and with decreased righting reaction.  She requires mod A for sitting balance.  Assist to pull socks over her feet Toilet Transfer: Moderate assistance,+2 for physical assistance,+2 for safety/equipment,Stand-pivot,BSC,RW Toilet Transfer Details (indicate cue type and reason): assist for balance and walker safety Toileting- Clothing Manipulation and Hygiene: Moderate assistance,+2 for physical assistance,+2 for safety/equipment,Sit to/from stand Toileting - Clothing Manipulation Details (indicate cue type and reason): Pt leans heavily to the Rt Functional mobility during ADLs: Moderate assistance,+2 for physical assistance,+2 for safety/equipment,Rolling walker  Cognition: Cognition Overall Cognitive Status: Impaired/Different from baseline Arousal/Alertness: Awake/alert Orientation Level: Oriented X4 Attention: Focused,Sustained,Alternating Focused Attention: Appears intact Sustained Attention: Impaired Sustained Attention Impairment: Verbal complex,Functional complex Alternating Attention: Impaired Alternating Attention Impairment: Verbal complex,Functional complex Memory: Impaired Memory Impairment: Decreased recall of new information,Decreased short term memory Decreased Short Term Memory: Verbal complex Awareness: Impaired Problem Solving: Impaired Problem Solving Impairment: Verbal complex,Functional complex Executive Function: Organizing,Self Monitoring Organizing: Impaired Organizing Impairment: Verbal complex,Functional complex Self Monitoring: Impaired Self Monitoring Impairment: Verbal complex,Functional complex Safety/Judgment: Impaired Cognition Arousal/Alertness: Awake/alert Behavior During Therapy: WFL for tasks assessed/performed Overall Cognitive Status: Impaired/Different from baseline Area of Impairment: Attention,Following commands,Awareness,Problem solving Current Attention Level: Selective Following Commands: Follows one  step commands consistently,Follows multi-step commands inconsistently Awareness: Intellectual Problem Solving: Slow processing,Difficulty sequencing,Requires verbal cues,Requires tactile cues General Comments: Pt with poor awareness of deficits and impact on function, needing repeated education on deficits and cause.   Blood pressure 121/70, pulse 75, temperature 98.4 F (36.9 C), temperature source Oral, resp. rate 20, SpO2 93 %. Physical Exam Gen: no distress, normal appearing HEENT: oral mucosa pink and moist, NCAT Cardio: Reg rate Chest: normal effort, normal rate of  breathing Abd: soft, non-distended Ext: no edema Psych: pleasant, normal affect Skin: intact Musculoskeletal:  Comments: Left hand with 2-3+ edema with ecchymosis. Neurological:  Mental Status: She is alertand oriented to person, place, and time.  Comments: Left facial weakness with ptosis. Speech clear. LLE>LUE weakness (4/5) with sensory deficits.   Lab Results Last 24 Hours       Results for orders placed or performed during the hospital encounter of 09/01/20 (from the past 24 hour(s))  I-STAT 7, (LYTES, BLD GAS, ICA, H+H)     Status: Abnormal   Collection Time: 09/01/20  3:11 PM  Result Value Ref Range   pH, Arterial 7.351 7.350 - 7.450   pCO2 arterial 42.0 32.0 - 48.0 mmHg   pO2, Arterial 248 (H) 83.0 - 108.0 mmHg   Bicarbonate 23.4 20.0 - 28.0 mmol/L   TCO2 25 22 - 32 mmol/L   O2 Saturation 100.0 %   Acid-base deficit 2.0 0.0 - 2.0 mmol/L   Sodium 138 135 - 145 mmol/L   Potassium 3.6 3.5 - 5.1 mmol/L   Calcium, Ion 1.18 1.15 - 1.40 mmol/L   HCT 42.0 36.0 - 46.0 %   Hemoglobin 14.3 12.0 - 15.0 g/dL   Patient temperature 97.7 F    Collection site Magazine features editor by Operator    Sample type ARTERIAL   Hemoglobin A1c     Status: Abnormal   Collection Time: 09/02/20  4:35 AM  Result Value Ref Range   Hgb A1c MFr Bld 5.9 (H) 4.8 - 5.6 %   Mean Plasma Glucose  122.63 mg/dL  Basic metabolic panel     Status: Abnormal   Collection Time: 09/02/20  4:35 AM  Result Value Ref Range   Sodium 137 135 - 145 mmol/L   Potassium 3.6 3.5 - 5.1 mmol/L   Chloride 108 98 - 111 mmol/L   CO2 21 (L) 22 - 32 mmol/L   Glucose, Bld 116 (H) 70 - 99 mg/dL   BUN 9 8 - 23 mg/dL   Creatinine, Ser 0.65 0.44 - 1.00 mg/dL   Calcium 8.5 (L) 8.9 - 10.3 mg/dL   GFR, Estimated >60 >60 mL/min   Anion gap 8 5 - 15  CBC     Status: None   Collection Time: 09/02/20  4:35 AM  Result Value Ref Range   WBC 10.3 4.0 - 10.5 K/uL   RBC 4.02 3.87 - 5.11 MIL/uL   Hemoglobin 12.5 12.0 - 15.0 g/dL   HCT 38.4 36.0 - 46.0 %   MCV 95.5 80.0 - 100.0 fL   MCH 31.1 26.0 - 34.0 pg   MCHC 32.6 30.0 - 36.0 g/dL   RDW 12.5 11.5 - 15.5 %   Platelets 240 150 - 400 K/uL   nRBC 0.0 0.0 - 0.2 %  Magnesium     Status: None   Collection Time: 09/02/20  4:35 AM  Result Value Ref Range   Magnesium 1.9 1.7 - 2.4 mg/dL  Phosphorus     Status: None   Collection Time: 09/02/20  4:35 AM  Result Value Ref Range   Phosphorus 2.6 2.5 - 4.6 mg/dL  I-STAT 7, (LYTES, BLD GAS, ICA, H+H)     Status: Abnormal   Collection Time: 09/02/20  4:36 AM  Result Value Ref Range   pH, Arterial 7.392 7.350 - 7.450   pCO2 arterial 36.3 32.0 - 48.0 mmHg   pO2, Arterial 77 (L) 83.0 - 108.0 mmHg   Bicarbonate 22.1 20.0 -  28.0 mmol/L   TCO2 23 22 - 32 mmol/L   O2 Saturation 95.0 %   Acid-base deficit 2.0 0.0 - 2.0 mmol/L   Sodium 139 135 - 145 mmol/L   Potassium 3.5 3.5 - 5.1 mmol/L   Calcium, Ion 1.24 1.15 - 1.40 mmol/L   HCT 35.0 (L) 36.0 - 46.0 %   Hemoglobin 11.9 (L) 12.0 - 15.0 g/dL   Patient temperature 98.8 F    Collection site art line    Drawn by HIDE    Sample type ARTERIAL   Lipid panel     Status: None   Collection Time: 09/02/20  4:41 AM  Result Value Ref Range   Cholesterol 188 0 - 200 mg/dL   Triglycerides 68 <150 mg/dL   HDL 75 >40 mg/dL    Total CHOL/HDL Ratio 2.5 RATIO   VLDL 14 0 - 40 mg/dL   LDL Cholesterol 99 0 - 99 mg/dL  Glucose, capillary     Status: Abnormal   Collection Time: 09/02/20  7:26 AM  Result Value Ref Range   Glucose-Capillary 107 (H) 70 - 99 mg/dL  Glucose, capillary     Status: Abnormal   Collection Time: 09/02/20 11:30 AM  Result Value Ref Range   Glucose-Capillary 128 (H) 70 - 99 mg/dL      Imaging Results (Last 48 hours)  CT Angio Head W or Wo Contrast  Result Date: 09/01/2020 CLINICAL DATA:  Right-sided facial droop and weakness since 10 p.m. EXAM: CT ANGIOGRAPHY HEAD AND NECK TECHNIQUE: Multidetector CT imaging of the head and neck was performed using the standard protocol during bolus administration of intravenous contrast. Multiplanar CT image reconstructions and MIPs were obtained to evaluate the vascular anatomy. Carotid stenosis measurements (when applicable) are obtained utilizing NASCET criteria, using the distal internal carotid diameter as the denominator. CONTRAST:  85mL OMNIPAQUE IOHEXOL 350 MG/ML SOLN COMPARISON:  Noncontrast head CT from earlier today FINDINGS: CTA NECK FINDINGS Aortic arch: Atheromatous plaque.  Three vessel branching. Right carotid system: Atheromatous wall thickening diffusely. Calcified plaque at the bifurcation without flow limiting stenosis or ulceration. Left carotid system: Circumferential atheromatous wall thickening. Calcified plaque at the bifurcation without ulceration or flow limiting stenosis. Vertebral arteries: No proximal subclavian flow limiting stenosis. Right dominant vertebral artery. Both vertebral arteries are patent to the dura. There is limitation to visualization of the proximal vertebral lumen on both sides due to artifact from intravenous contrast. Skeleton: Cervical spine degeneration with C3-4 and C4-5 anterolisthesis. Other neck: Extensive intravenous reflux of contrast. There is a stenotic appearance of the left brachiocephalic vein. The  bolus dispersion cause a sub optimal opacification of arterial structures. Upper chest: Negative Review of the MIP images confirms the above findings CTA HEAD FINDINGS Anterior circulation: Atheromatous calcification at the carotid siphons. Hypoplastic left A1 segment with mildly larger right ICA. Atheromatous changes to medium vessels. There is a notable high-grade narrowing at the right A2 level. Severe narrowing of the left lower M2 branch seen on reformats. Posterior circulation: Right dominant vertebral artery. Atheromatous plaque at both V4 segments with 50-60% narrowing of the left V4 segment. The basilar artery is smooth and diffusely patent. Left P2 branch occlusion with subsequent reconstitution and then subsequent severe stenosis at the PCA bifurcation. Negative for aneurysm. Venous sinuses: Diffusely patent Anatomic variants: None significant Review of the MIP images confirms the above findings These results were called by telephone at the time of interpretation on 09/01/2020 at 7:48 am to provider [facility] , who  verbally acknowledged these results. IMPRESSION: 1. Confirmed left PCA occlusion at the left P2 segment with downstream reconstitution followed by a severe stenosis at the left PCA bifurcation. 2. Intracranial atherosclerosis with notable right A2, left M2, and left V4 segment stenoses. 3. Atherosclerosis in the neck without flow limiting stenosis. 4. Left brachiocephalic vein stenosis. Electronically Signed   By: Monte Fantasia M.D.   On: 09/01/2020 07:49   CT HEAD WO CONTRAST  Result Date: 09/01/2020 CLINICAL DATA:  Stroke follow-up. EXAM: CT HEAD WITHOUT CONTRAST TECHNIQUE: Contiguous axial images were obtained from the base of the skull through the vertex without intravenous contrast. COMPARISON:  Same day CT head. FINDINGS: Brain: Similar subtle hypodensity in the left temporal occipital cortex. Areas of subtle cortical hyperdensity described on the recent prior have resolved  and likely represented contrast staining recent catheter arteriogram. No evidence of new acute large vascular territory infarct. No acute hemorrhage. No progressive mass effect. No midline shift. Basal cisterns are patent. No hydrocephalus. No mass lesion. No visible extra-axial fluid collections. Vascular: Left PCA stent. Vasculature better characterized on same day catheter arteriogram. Skull: No acute fracture. Sinuses/Orbits: No acute abnormality. Other: Small left mastoid effusion. IMPRESSION: 1. Similar suspected small acute infarct in the left PCA territory. No progressive mass effect. MRI could further characterize if clinically indicated. 2. Areas of subtle cortical hyperdensity described on the recent prior have resolved and likely represented contrast staining from recent catheter arteriogram. No evidence of acute hemorrhage. Electronically Signed   By: Margaretha Sheffield MD   On: 09/01/2020 16:16   CT HEAD WO CONTRAST  Result Date: 09/01/2020 CLINICAL DATA:  Stroke follow-up post intervention. EXAM: CT HEAD WITHOUT CONTRAST TECHNIQUE: Contiguous axial images were obtained from the base of the skull through the vertex without intravenous contrast. COMPARISON:  Same day CT exams. FINDINGS: Brain: Similar subtle loaded in the left temporal occipital cortex. No evidence of mass occupying acute hemorrhage. Subtle/faint cortical versus extra-axial hyperdensity in the posteroinferior right temporal and occipital lobes (for example see series 6, image 29 and image 19). Evaluation for small volume subarachnoid hemorrhage is somewhat limited due to residual contrast within the vasculature. No hydrocephalus. No midline shift. Basal cisterns are patent. No mass lesion. Vascular: Residual contrast is noted within the vasculature from same day contrasted studies. Vasculature is better evaluated on same day catheter arteriogram. Skull: No acute fracture. Sinuses/Orbits: No acute findings. Other: Mastoid effusions.  IMPRESSION: 1. No substantial change in small acute infarct in the left occipital parietal cortex. No progressive mass effect 2. Subtle/faint cortical versus extra-axial hyperdensity in the posteroinferior right temporal and occipital lobes, which could represent contrast staining given recent catheter arteriogram versus trace hemorrhage. No mass occupying acute hemorrhage. Recommend attention on close follow-up. Electronically Signed   By: Margaretha Sheffield MD   On: 09/01/2020 12:49   CT Angio Neck W and/or Wo Contrast  Result Date: 09/01/2020 CLINICAL DATA:  Right-sided facial droop and weakness since 10 p.m. EXAM: CT ANGIOGRAPHY HEAD AND NECK TECHNIQUE: Multidetector CT imaging of the head and neck was performed using the standard protocol during bolus administration of intravenous contrast. Multiplanar CT image reconstructions and MIPs were obtained to evaluate the vascular anatomy. Carotid stenosis measurements (when applicable) are obtained utilizing NASCET criteria, using the distal internal carotid diameter as the denominator. CONTRAST:  27mL OMNIPAQUE IOHEXOL 350 MG/ML SOLN COMPARISON:  Noncontrast head CT from earlier today FINDINGS: CTA NECK FINDINGS Aortic arch: Atheromatous plaque.  Three vessel branching. Right carotid system:  Atheromatous wall thickening diffusely. Calcified plaque at the bifurcation without flow limiting stenosis or ulceration. Left carotid system: Circumferential atheromatous wall thickening. Calcified plaque at the bifurcation without ulceration or flow limiting stenosis. Vertebral arteries: No proximal subclavian flow limiting stenosis. Right dominant vertebral artery. Both vertebral arteries are patent to the dura. There is limitation to visualization of the proximal vertebral lumen on both sides due to artifact from intravenous contrast. Skeleton: Cervical spine degeneration with C3-4 and C4-5 anterolisthesis. Other neck: Extensive intravenous reflux of contrast. There is  a stenotic appearance of the left brachiocephalic vein. The bolus dispersion cause a sub optimal opacification of arterial structures. Upper chest: Negative Review of the MIP images confirms the above findings CTA HEAD FINDINGS Anterior circulation: Atheromatous calcification at the carotid siphons. Hypoplastic left A1 segment with mildly larger right ICA. Atheromatous changes to medium vessels. There is a notable high-grade narrowing at the right A2 level. Severe narrowing of the left lower M2 branch seen on reformats. Posterior circulation: Right dominant vertebral artery. Atheromatous plaque at both V4 segments with 50-60% narrowing of the left V4 segment. The basilar artery is smooth and diffusely patent. Left P2 branch occlusion with subsequent reconstitution and then subsequent severe stenosis at the PCA bifurcation. Negative for aneurysm. Venous sinuses: Diffusely patent Anatomic variants: None significant Review of the MIP images confirms the above findings These results were called by telephone at the time of interpretation on 09/01/2020 at 7:48 am to provider [facility] , who verbally acknowledged these results. IMPRESSION: 1. Confirmed left PCA occlusion at the left P2 segment with downstream reconstitution followed by a severe stenosis at the left PCA bifurcation. 2. Intracranial atherosclerosis with notable right A2, left M2, and left V4 segment stenoses. 3. Atherosclerosis in the neck without flow limiting stenosis. 4. Left brachiocephalic vein stenosis. Electronically Signed   By: Monte Fantasia M.D.   On: 09/01/2020 07:49   MR ANGIO HEAD WO CONTRAST  Result Date: 09/02/2020 CLINICAL DATA:  Stroke follow-up status post thrombectomy. EXAM: MRI HEAD WITHOUT CONTRAST MRA HEAD WITHOUT CONTRAST TECHNIQUE: Multiplanar, multiecho pulse sequences of the brain and surrounding structures were obtained without intravenous contrast. Angiographic images of the head were obtained using MRA technique  without contrast. COMPARISON:  Head CT 09/01/2020 FINDINGS: MRI HEAD FINDINGS Brain: Left PCA territory acute ischemia involving the left occipital lobe, medial left temporal lobe and left thalamus. There is also punctate focus of abnormal diffusion restriction at the base of the right precentral gyrus. Focus of chronic microhemorrhage in the brainstem. No acute hemorrhage. There is multifocal hyperintense T2-weighted signal within the white matter. Parenchymal volume and CSF spaces are normal. The midline structures are normal. Vascular: Major flow voids are preserved. Skull and upper cervical spine: Normal calvarium and skull base. Visualized upper cervical spine and soft tissues are normal. Sinuses/Orbits:No paranasal sinus fluid levels or advanced mucosal thickening. No mastoid or middle ear effusion. Normal orbits. MRA HEAD FINDINGS POSTERIOR CIRCULATION: --Vertebral arteries: Normal --Inferior cerebellar arteries: Normal. --Basilar artery: Normal. --Superior cerebellar arteries: Normal. --Posterior cerebral arteries: There is now a stent in the left PCA P1 and P2 segments. There is normal distal flow related enhancement. Right PCA is normal. ANTERIOR CIRCULATION: --Intracranial internal carotid arteries: Normal. --Anterior cerebral arteries (ACA): Normal. --Middle cerebral arteries (MCA): Severe stenosis of the proximal M2 segment inferior division of the left MCA. No stenosis of the right MCA. ANATOMIC VARIANTS: None IMPRESSION: 1. Left PCA territory acute ischemia involving the left occipital lobe, medial left temporal lobe  and left thalamus. No acute hemorrhage or mass effect. 2. Punctate focus of acute ischemia at the base of the right precentral gyrus. 3. Severe stenosis of the proximal M2 segment inferior division of the left MCA. 4. Status post stent placement of left PCA P1 and P2 segments with normal distal flow related enhancement. Electronically Signed   By: Ulyses Jarred M.D.   On: 09/02/2020 03:38    MR BRAIN WO CONTRAST  Result Date: 09/02/2020 CLINICAL DATA:  Stroke follow-up status post thrombectomy. EXAM: MRI HEAD WITHOUT CONTRAST MRA HEAD WITHOUT CONTRAST TECHNIQUE: Multiplanar, multiecho pulse sequences of the brain and surrounding structures were obtained without intravenous contrast. Angiographic images of the head were obtained using MRA technique without contrast. COMPARISON:  Head CT 09/01/2020 FINDINGS: MRI HEAD FINDINGS Brain: Left PCA territory acute ischemia involving the left occipital lobe, medial left temporal lobe and left thalamus. There is also punctate focus of abnormal diffusion restriction at the base of the right precentral gyrus. Focus of chronic microhemorrhage in the brainstem. No acute hemorrhage. There is multifocal hyperintense T2-weighted signal within the white matter. Parenchymal volume and CSF spaces are normal. The midline structures are normal. Vascular: Major flow voids are preserved. Skull and upper cervical spine: Normal calvarium and skull base. Visualized upper cervical spine and soft tissues are normal. Sinuses/Orbits:No paranasal sinus fluid levels or advanced mucosal thickening. No mastoid or middle ear effusion. Normal orbits. MRA HEAD FINDINGS POSTERIOR CIRCULATION: --Vertebral arteries: Normal --Inferior cerebellar arteries: Normal. --Basilar artery: Normal. --Superior cerebellar arteries: Normal. --Posterior cerebral arteries: There is now a stent in the left PCA P1 and P2 segments. There is normal distal flow related enhancement. Right PCA is normal. ANTERIOR CIRCULATION: --Intracranial internal carotid arteries: Normal. --Anterior cerebral arteries (ACA): Normal. --Middle cerebral arteries (MCA): Severe stenosis of the proximal M2 segment inferior division of the left MCA. No stenosis of the right MCA. ANATOMIC VARIANTS: None IMPRESSION: 1. Left PCA territory acute ischemia involving the left occipital lobe, medial left temporal lobe and left thalamus.  No acute hemorrhage or mass effect. 2. Punctate focus of acute ischemia at the base of the right precentral gyrus. 3. Severe stenosis of the proximal M2 segment inferior division of the left MCA. 4. Status post stent placement of left PCA P1 and P2 segments with normal distal flow related enhancement. Electronically Signed   By: Ulyses Jarred M.D.   On: 09/02/2020 03:38   CT CEREBRAL PERFUSION W CONTRAST  Result Date: 09/01/2020 CLINICAL DATA:  Right-sided facial droop and weakness. Left P2 occlusion on CTA. EXAM: CT PERFUSION BRAIN TECHNIQUE: Multiphase CT imaging of the brain was performed following IV bolus contrast injection. Subsequent parametric perfusion maps were calculated using RAPID software. CONTRAST:  43mL OMNIPAQUE IOHEXOL 350 MG/ML SOLN COMPARISON:  Head and neck CTA earlier today FINDINGS: CT Brain Perfusion Findings: CBF (<30%) Volume: 0 mL Perfusion (Tmax>6.0s) volume: 42 mL Mismatch Volume: 42 mL Infarction Location: The small acute left temporo-occipital infarct on today's noncontrast CT was not detected by automated CTP processing. There is penumbra in the left PCA territory although the reported mismatch volume also includes tissue in both cerebellar hemispheres. IMPRESSION: Left PCA penumbra which is smaller than the reported 42 mL mismatch volume as that also includes bilateral cerebellar tissue. These results were communicated to Dr. Rory Percy at 8:43 am on 09/01/2020 by text page via the [facility] messaging system. Electronically Signed   By: Logan Bores M.D.   On: 09/01/2020 08:47   DG CHEST PORT 1 VIEW  Result Date: 09/02/2020 CLINICAL DATA:  Stroke. EXAM: PORTABLE CHEST 1 VIEW COMPARISON:  09/01/2020.  07/18/2018 FINDINGS: Interim extubation and removal of NG tube. Stable cardiomegaly. No pulmonary venous congestion. No focal infiltrate. Questionable density right upper lung possibly a small area of atelectasis. Follow-up PA and lateral chest x-ray suggested pulmonary lesion. Tiny  calcified pulmonary nodule in the right again noted consistent with calcified granuloma. Prominent hiatal hernia again noted. Degenerative changes scoliosis thoracic spine. IMPRESSION: 1.  Interim extubation and removal of NG tube. 2.  Stable cardiomegaly. 3. Questionable density right upper lung, possibly a small area of atelectasis. Follow-up PA lateral chest x-ray suggested to exclude a focal pulmonary lesion. 4.  Prominent hiatal hernia again noted. Electronically Signed   By: Marcello Moores  Register   On: 09/02/2020 06:29   DG Chest Port 1 View  Result Date: 09/01/2020 CLINICAL DATA:  Endotracheal tube placement. EXAM: PORTABLE CHEST 1 VIEW COMPARISON:  July 18, 2018. FINDINGS: The heart size and mediastinal contours are within normal limits. Endotracheal tube is in grossly good position. Nasogastric tube is looped within the hiatal hernia. No pneumothorax is noted. Left lung is clear. Mild right basilar subsegmental atelectasis is noted. The visualized skeletal structures are unremarkable. IMPRESSION: Endotracheal tube in grossly good position. Nasogastric tube tip is seen within the hiatal hernia. Mild right basilar subsegmental atelectasis. Electronically Signed   By: Marijo Conception M.D.   On: 09/01/2020 16:26   ECHOCARDIOGRAM COMPLETE  Result Date: 09/01/2020    ECHOCARDIOGRAM REPORT   Patient Name:   Deanna Williams [facility] Date of Exam: 09/01/2020 Medical Rec #:  [id number]       Height:       63.0 in Accession #:    [id number]      Weight:       150.0 lb Date of Birth:  [birth year]       BSA:          75.711 m Patient Age:    71 years        BP:           133/60 mmHg Patient Gender: F               HR:           86 bpm. Exam Location:  Inpatient Procedure: 2D Echo, Color Doppler and Cardiac Doppler Indications:    Stroke I63.9  History:        Patient has no prior history of Echocardiogram examinations.                 Risk Factors:Hypertension and Dyslipidemia.  Sonographer:    Darlina Sicilian RDCS Referring  Phys: [id number] [facility]  Sonographer Comments: Suboptimal parasternal window, suboptimal apical window and echo performed with patient supine and on artificial respirator. IMPRESSIONS  1. Left ventricular ejection fraction, by estimation, is 60 to 65%. The left ventricle has normal function. The left ventricle has no regional wall motion abnormalities. Left ventricular diastolic parameters are consistent with Grade I diastolic dysfunction (impaired relaxation).  2. Right ventricular systolic function is normal. The right ventricular size is normal. There is mildly elevated pulmonary artery systolic pressure.  3. The mitral valve is normal in structure. No evidence of mitral valve regurgitation. No evidence of mitral stenosis.  4. The aortic valve is normal in structure. Aortic valve regurgitation is not visualized. No aortic stenosis is present.  5. The inferior vena cava is normal in size with greater than 50% respiratory variability, suggesting  right atrial pressure of 3 mmHg. FINDINGS  Left Ventricle: Left ventricular ejection fraction, by estimation, is 60 to 65%. The left ventricle has normal function. The left ventricle has no regional wall motion abnormalities. The left ventricular internal cavity size was normal in size. There is  no left ventricular hypertrophy. Left ventricular diastolic parameters are consistent with Grade I diastolic dysfunction (impaired relaxation). Normal left ventricular filling pressure. Right Ventricle: The right ventricular size is normal. No increase in right ventricular wall thickness. Right ventricular systolic function is normal. There is mildly elevated pulmonary artery systolic pressure. The tricuspid regurgitant velocity is 2.84  m/s, and with an assumed right atrial pressure of 8 mmHg, the estimated right ventricular systolic pressure is 0000000 mmHg. Left Atrium: Left atrial size was normal in size. Right Atrium: Right atrial size was normal in size. Pericardium: There is  no evidence of pericardial effusion. Mitral Valve: The mitral valve is normal in structure. No evidence of mitral valve regurgitation. No evidence of mitral valve stenosis. Tricuspid Valve: The tricuspid valve is normal in structure. Tricuspid valve regurgitation is mild . No evidence of tricuspid stenosis. Aortic Valve: The aortic valve is normal in structure. Aortic valve regurgitation is not visualized. No aortic stenosis is present. Pulmonic Valve: The pulmonic valve was normal in structure. Pulmonic valve regurgitation is not visualized. No evidence of pulmonic stenosis. Aorta: The aortic root is normal in size and structure. Venous: IVC assessment for right atrial pressure unable to be performed due to mechanical ventilation. The inferior vena cava is normal in size with greater than 50% respiratory variability, suggesting right atrial pressure of 3 mmHg. IAS/Shunts: No atrial level shunt detected by color flow Doppler.  LEFT VENTRICLE PLAX 2D LVIDd:         3.80 cm  Diastology LVIDs:         2.30 cm  LV e' medial:    6.31 cm/s LV PW:         0.90 cm  LV E/e' medial:  9.4 LV IVS:        0.90 cm  LV e' lateral:   9.14 cm/s LVOT diam:     2.00 cm  LV E/e' lateral: 6.5 LV SV:         54 LV SV Index:   32 LVOT Area:     3.14 cm  RIGHT VENTRICLE RV S prime:     12.90 cm/s LEFT ATRIUM             Index LA diam:        3.30 cm 1.93 cm/m LA Vol (A2C):   30.6 ml 17.88 ml/m LA Vol (A4C):   47.0 ml 27.47 ml/m LA Biplane Vol: 40.8 ml 23.84 ml/m  AORTIC VALVE LVOT Vmax:   95.80 cm/s LVOT Vmean:  55.500 cm/s LVOT VTI:    0.172 m  AORTA Ao Root diam: 2.90 cm MITRAL VALVE               TRICUSPID VALVE MV Area (PHT): 5.54 cm    TR Peak grad:   32.3 mmHg MV Decel Time: 137 msec    TR Vmax:        284.00 cm/s MV E velocity: 59.60 cm/s MV A velocity: 87.00 cm/s  SHUNTS MV E/A ratio:  0.69        Systemic VTI:  0.17 m  Systemic Diam: 2.00 cm Sanda Klein MD Electronically signed by Sanda Klein  MD Signature Date/Time: 09/01/2020/2:22:50 PM    Final    CT HEAD CODE STROKE WO CONTRAST  Result Date: 09/01/2020 CLINICAL DATA:  Code stroke. Right-sided facial droop and weakness since 10 p.m. EXAM: CT HEAD WITHOUT CONTRAST TECHNIQUE: Contiguous axial images were obtained from the base of the skull through the vertex without intravenous contrast. COMPARISON:  Brain MRI 08/23/2013 FINDINGS: Brain: Subtle low-density in the left temporal occipital cortex. No other visible infarct. No hemorrhage, hydrocephalus, or collection. Vascular: Atheromatous calcification. Especially on coronal imaging the left P 2 or P3 segment appears dense Skull: Normal. Negative for fracture or focal lesion. Sinuses/Orbits: Negative Other: These results were called by telephone at the time of interpretation on 09/01/2020 at 7:04 am to provider Dr Charna Archer, who verbally acknowledged these results. IMPRESSION: 1. Small acute infarct in the left occipital parietal cortex. The adjacent PCA is hyperdense on reformats, suggesting acute thrombosis. The visible infarct is smaller than would be expected for the level of vessel hyperdensity, consider CTA/perfusion if a treatment candidate. 2. No intracranial hemorrhage. Electronically Signed   By: Monte Fantasia M.D.   On: 09/01/2020 07:08      Assessment/Plan: Diagnosis: Left PCA stroke involving occipital and temporal lobes and thalamus 1. Does the need for close, 24 hr/day medical supervision in concert with the patient's rehab needs make it unreasonable for this patient to be served in a less intensive setting? Yes 2. Co-Morbidities requiring supervision/potential complications: 1. Diffuse weakness: will require intensive PT and OT 2. GERD 3. HTN: monitor BP TID 4. HLD 5. Prediabetes: monitor CBGs 3. Due to bladder management, bowel management, safety, skin/wound care, disease management, medication administration, pain management and patient education, does the patient  require 24 hr/day rehab nursing? Yes 4. Does the patient require coordinated care of a physician, rehab nurse, therapy disciplines of PT, OT to address physical and functional deficits in the context of the above medical diagnosis(es)? Yes Addressing deficits in the following areas: balance, endurance, locomotion, strength, transferring, bowel/bladder control, bathing, dressing, feeding, grooming, toileting and psychosocial support 5. Can the patient actively participate in an intensive therapy program of at least 3 hrs of therapy per day at least 5 days per week? Yes 6. The potential for patient to make measurable gains while on inpatient rehab is excellent 7. Anticipated functional outcomes upon discharge from inpatient rehab are modified independent  with PT, modified independent with OT, independent with SLP. 8. Estimated rehab length of stay to reach the above functional goals is: 10-14 days 9. Anticipated discharge destination: Home 10. Overall Rehab/Functional Prognosis: excellent  RECOMMENDATIONS: This patient's condition is appropriate for continued rehabilitative care in the following setting: CIR Patient has agreed to participate in recommended program. Yes Note that insurance prior authorization may be required for reimbursement for recommended care.  Comment: Thank you for this consult. Admission coordinator to follow.   I have personally performed a face to face diagnostic evaluation, including, but not limited to relevant history and physical exam findings, of this patient and developed relevant assessment and plan.  Additionally, I have reviewed and concur with the physician assistant's documentation above.  Martha Clan Expand All Collapse All     Show:Clear all [x] Manual[x] Template[x] Copied  Added by: [x] Love, Ivan Anchors, PA-C[x] Raulkar, Clide Deutscher, MD   [] Hover for details       Physical Medicine and Rehabilitation Consult   Reason for Consult: Stroke  with functional deficits Referring Physician: Dr.  Xu    HPI: Deanna Williams is a 87 y.o. female with history of HTN, palpitations who was admitted via [facility] on 09/01/20 with right sided weakness with numbness.  CT head showed small acute infarct in left occipital parietal cortex with hyperdense signal suggesting acute thrombosis. She underwent cerebral angio with thrombectomy and rescue stent for reocclusion by Dr. Estanislado Pandy. MRI brain done for follow up and revealed L-PCA ischemia involving occipital, medial temporal and thalamus. 2D echo showed EF 60-65% with no wall abnormality and plans for loop recorder in am. Therapy evaluations in progress and patient limited by weakness with mild cognitive deficits. CIR recommended due to functional decline. Left hand edematous and mildly painful.    Review of Systems  Constitutional: Negative for fever.  HENT: Negative for hearing loss.   Eyes: Positive for blurred vision (wears glasses).  Respiratory: Negative for cough and shortness of breath.   Cardiovascular: Negative for chest pain and palpitations.  Gastrointestinal: Negative for constipation, heartburn and nausea.  Genitourinary: Negative for dysuria and urgency.  Musculoskeletal: Negative for joint pain and myalgias.  Skin: Negative for rash.  Neurological: Positive for sensory change and focal weakness. Negative for dizziness and headaches.  Psychiatric/Behavioral: Negative for memory loss.          Past Medical History:  Diagnosis Date  . Gastroesophageal reflux disease   . Hyperlipidemia   . Hypertension   . Lichen sclerosus   . Lichen sclerosus et atrophicus   . Osteopenia   . Palpitations          Past Surgical History:  Procedure Laterality Date  . CARPAL TUNNEL RELEASE    . FOOT SURGERY    . HYSTEROSCOPY WITH D & C N/A 11/02/2015   Procedure: DILATATION AND CURETTAGE /HYSTEROSCOPY;  Surgeon: Brayton Mars, MD;  Location: [facility] ORS;  Service:  Gynecology;  Laterality: N/A;  . RADIOLOGY WITH ANESTHESIA N/A 09/01/2020   Procedure: IR WITH ANESTHESIA;  Surgeon: Radiologist, Medication, MD;  Location: [place];  Service: Radiology;  Laterality: N/A;         Family History  Problem Relation Age of Onset  . Cancer Mother   . Leukemia Mother   . Cancer Father        colon  . Hypertension Father   . Arthritis Sister   . Breast cancer Neg Hx     Social History:  Widowed. Used to work in a [employer]. She has a house at [place] facility and was active PTA (cooks, drives other residents to MD offices). She reports that she has never smoked. She has never used smokeless tobacco. She reports current alcohol use--a mixed drink occasionally. She reports that she does not use drugs.    Allergies: No Known Allergies          Medications Prior to Admission  Medication Sig Dispense Refill  . conjugated estrogens (PREMARIN) vaginal cream Place 1 Applicatorful vaginally daily. (Patient not taking: Reported on 09/01/2020) 42.5 g 12  . omeprazole (PRILOSEC) 20 MG capsule Take 1 capsule (20 mg total) by mouth daily. (Patient not taking: Reported on 09/01/2020) 30 capsule 3  . terconazole (TERAZOL 3) 0.8 % vaginal cream Place 1 applicator vaginally at bedtime. (Patient not taking: No sig reported) 20 g 3    Home: Home Living Family/patient expects to be discharged to:: Private residence Living Arrangements: Alone Available Help at Discharge: Family Type of Home: Independent living facility Home Access: Level entry [place]: One level Bathroom  Shower/Tub: Multimedia programmer: Handicapped height Home Equipment: Grab bars - toilet,Grab bars - tub/shower,Hand held shower head,None Additional Comments: Pt lives in a retirement community with her yorkie  Lives With: Alone  Functional History: Prior Function Level of Independence: Independent Comments: drives, grocery shops, cooks, walks her  dog Functional Status:  Mobility: Bed Mobility Overal bed mobility: Needs Assistance Bed Mobility: Supine to Sit Supine to sit: Min assist General bed mobility comments: assist for balance Transfers Overall transfer level: Needs assistance Equipment used: 2 person hand held assist,Rolling walker (2 wheeled) Transfers: Sit to/from [place] to Stand: Mod assist,+2 physical assistance,+2 safety/equipment Stand pivot transfers: Mod assist,+2 physical assistance,+2 safety/equipment General transfer comment: Pt with heavy Rt lateral lean with Rt knee buckling.  She requires facilitation for Rt knee and hip extension. Pt also with R ankle instability, needing intermittent blocking to prevent inversion injury. Cues for hand placement with use of RW. Ambulation/Gait Ambulation/Gait assistance: Mod assist,+2 physical assistance,+2 safety/equipment Gait Distance (Feet): 3 Feet Assistive device: Rolling walker (2 wheeled) Gait Pattern/deviations: Step-through pattern,Decreased step length - right,Decreased step length - left,Decreased stride length,Decreased weight shift to right,Decreased weight shift to left,Decreased dorsiflexion - right,Trunk flexed (leans R)  ADL: ADL Overall ADL's : Needs assistance/impaired Eating/Feeding: Modified independent,Sitting,Bed level Grooming: Wash/dry hands,Wash/dry face,Oral care,Brushing hair,Set up,Supervision/safety,Sitting Grooming Details (indicate cue type and reason): looses balance to the Rt Upper Body Bathing: Minimal assistance,Sitting Upper Body Bathing Details (indicate cue type and reason): for sitting balance Lower Body Bathing: Moderate assistance,Sit to/from stand Upper Body Dressing : Moderate assistance,Sitting Lower Body Dressing: Moderate assistance,Sit to/from stand Lower Body Dressing Details (indicate cue type and reason): Pt looses balance to the Rt with poor awareness and with decreased righting reaction.  She  requires mod A for sitting balance.  Assist to pull socks over her feet Toilet Transfer: Moderate assistance,+2 for physical assistance,+2 for safety/equipment,Stand-pivot,BSC,RW Toilet Transfer Details (indicate cue type and reason): assist for balance and walker safety Toileting- Clothing Manipulation and Hygiene: Moderate assistance,+2 for physical assistance,+2 for safety/equipment,Sit to/from stand Toileting - Clothing Manipulation Details (indicate cue type and reason): Pt leans heavily to the Rt Functional mobility during ADLs: Moderate assistance,+2 for physical assistance,+2 for safety/equipment,Rolling walker  Cognition: Cognition Overall Cognitive Status: Impaired/Different from baseline Arousal/Alertness: Awake/alert Orientation Level: Oriented X4 Attention: Focused,Sustained,Alternating Focused Attention: Appears intact Sustained Attention: Impaired Sustained Attention Impairment: Verbal complex,Functional complex Alternating Attention: Impaired Alternating Attention Impairment: Verbal complex,Functional complex Memory: Impaired Memory Impairment: Decreased recall of new information,Decreased short term memory Decreased Short Term Memory: Verbal complex Awareness: Impaired Problem Solving: Impaired Problem Solving Impairment: Verbal complex,Functional complex Executive Function: Organizing,Self Monitoring Organizing: Impaired Organizing Impairment: Verbal complex,Functional complex Self Monitoring: Impaired Self Monitoring Impairment: Verbal complex,Functional complex Safety/Judgment: Impaired Cognition Arousal/Alertness: Awake/alert Behavior During Therapy: WFL for tasks assessed/performed Overall Cognitive Status: Impaired/Different from baseline Area of Impairment: Attention,Following commands,Awareness,Problem solving Current Attention Level: Selective Following Commands: Follows one step commands consistently,Follows multi-step commands  inconsistently Awareness: Intellectual Problem Solving: Slow processing,Difficulty sequencing,Requires verbal cues,Requires tactile cues General Comments: Pt with poor awareness of deficits and impact on function, needing repeated education on deficits and cause.   Blood pressure 121/70, pulse 75, temperature 98.4 F (36.9 C), temperature source Oral, resp. rate 20, SpO2 93 %. Physical Exam Gen: no distress, normal appearing HEENT: oral mucosa pink and moist, NCAT Cardio: Reg rate Chest: normal effort, normal rate of breathing Abd: soft, non-distended Ext: no edema Psych: pleasant, normal affect Skin: intact Musculoskeletal:  Comments: Left  hand with 2-3+ edema with ecchymosis. Neurological:  Mental Status: She is alertand oriented to person, place, and time.  Comments: Left facial weakness with ptosis. Speech clear. LLE>LUE weakness (4/5) with sensory deficits.   Lab Results Last 24 Hours       Results for orders placed or performed during the hospital encounter of 09/01/20 (from the past 24 hour(s))  I-STAT 7, (LYTES, BLD GAS, ICA, H+H)     Status: Abnormal   Collection Time: 09/01/20  3:11 PM  Result Value Ref Range   pH, Arterial 7.351 7.350 - 7.450   pCO2 arterial 42.0 32.0 - 48.0 mmHg   pO2, Arterial 248 (H) 83.0 - 108.0 mmHg   Bicarbonate 23.4 20.0 - 28.0 mmol/L   TCO2 25 22 - 32 mmol/L   O2 Saturation 100.0 %   Acid-base deficit 2.0 0.0 - 2.0 mmol/L   Sodium 138 135 - 145 mmol/L   Potassium 3.6 3.5 - 5.1 mmol/L   Calcium, Ion 1.18 1.15 - 1.40 mmol/L   HCT 42.0 36.0 - 46.0 %   Hemoglobin 14.3 12.0 - 15.0 g/dL   Patient temperature 97.7 F    Collection site Magazine features editor by Operator    Sample type ARTERIAL   Hemoglobin A1c     Status: Abnormal   Collection Time: 09/02/20  4:35 AM  Result Value Ref Range   Hgb A1c MFr Bld 5.9 (H) 4.8 - 5.6 %   Mean Plasma Glucose 122.63 mg/dL  Basic metabolic panel     Status:  Abnormal   Collection Time: 09/02/20  4:35 AM  Result Value Ref Range   Sodium 137 135 - 145 mmol/L   Potassium 3.6 3.5 - 5.1 mmol/L   Chloride 108 98 - 111 mmol/L   CO2 21 (L) 22 - 32 mmol/L   Glucose, Bld 116 (H) 70 - 99 mg/dL   BUN 9 8 - 23 mg/dL   Creatinine, Ser 0.65 0.44 - 1.00 mg/dL   Calcium 8.5 (L) 8.9 - 10.3 mg/dL   GFR, Estimated >60 >60 mL/min   Anion gap 8 5 - 15  CBC     Status: None   Collection Time: 09/02/20  4:35 AM  Result Value Ref Range   WBC 10.3 4.0 - 10.5 K/uL   RBC 4.02 3.87 - 5.11 MIL/uL   Hemoglobin 12.5 12.0 - 15.0 g/dL   HCT 38.4 36.0 - 46.0 %   MCV 95.5 80.0 - 100.0 fL   MCH 31.1 26.0 - 34.0 pg   MCHC 32.6 30.0 - 36.0 g/dL   RDW 12.5 11.5 - 15.5 %   Platelets 240 150 - 400 K/uL   nRBC 0.0 0.0 - 0.2 %  Magnesium     Status: None   Collection Time: 09/02/20  4:35 AM  Result Value Ref Range   Magnesium 1.9 1.7 - 2.4 mg/dL  Phosphorus     Status: None   Collection Time: 09/02/20  4:35 AM  Result Value Ref Range   Phosphorus 2.6 2.5 - 4.6 mg/dL  I-STAT 7, (LYTES, BLD GAS, ICA, H+H)     Status: Abnormal   Collection Time: 09/02/20  4:36 AM  Result Value Ref Range   pH, Arterial 7.392 7.350 - 7.450   pCO2 arterial 36.3 32.0 - 48.0 mmHg   pO2, Arterial 77 (L) 83.0 - 108.0 mmHg   Bicarbonate 22.1 20.0 - 28.0 mmol/L   TCO2 23 22 - 32 mmol/L   O2 Saturation 95.0 %  Acid-base deficit 2.0 0.0 - 2.0 mmol/L   Sodium 139 135 - 145 mmol/L   Potassium 3.5 3.5 - 5.1 mmol/L   Calcium, Ion 1.24 1.15 - 1.40 mmol/L   HCT 35.0 (L) 36.0 - 46.0 %   Hemoglobin 11.9 (L) 12.0 - 15.0 g/dL   Patient temperature 98.8 F    Collection site art line    Drawn by HIDE    Sample type ARTERIAL   Lipid panel     Status: None   Collection Time: 09/02/20  4:41 AM  Result Value Ref Range   Cholesterol 188 0 - 200 mg/dL   Triglycerides 68 <150 mg/dL   HDL 75 >40 mg/dL   Total CHOL/HDL Ratio 2.5 RATIO   VLDL 14 0 - 40  mg/dL   LDL Cholesterol 99 0 - 99 mg/dL  Glucose, capillary     Status: Abnormal   Collection Time: 09/02/20  7:26 AM  Result Value Ref Range   Glucose-Capillary 107 (H) 70 - 99 mg/dL  Glucose, capillary     Status: Abnormal   Collection Time: 09/02/20 11:30 AM  Result Value Ref Range   Glucose-Capillary 128 (H) 70 - 99 mg/dL      Imaging Results (Last 48 hours)  CT Angio Head W or Wo Contrast  Result Date: 09/01/2020 CLINICAL DATA:  Right-sided facial droop and weakness since 10 p.m. EXAM: CT ANGIOGRAPHY HEAD AND NECK TECHNIQUE: Multidetector CT imaging of the head and neck was performed using the standard protocol during bolus administration of intravenous contrast. Multiplanar CT image reconstructions and MIPs were obtained to evaluate the vascular anatomy. Carotid stenosis measurements (when applicable) are obtained utilizing NASCET criteria, using the distal internal carotid diameter as the denominator. CONTRAST:  65mL OMNIPAQUE IOHEXOL 350 MG/ML SOLN COMPARISON:  Noncontrast head CT from earlier today FINDINGS: CTA NECK FINDINGS Aortic arch: Atheromatous plaque.  Three vessel branching. Right carotid system: Atheromatous wall thickening diffusely. Calcified plaque at the bifurcation without flow limiting stenosis or ulceration. Left carotid system: Circumferential atheromatous wall thickening. Calcified plaque at the bifurcation without ulceration or flow limiting stenosis. Vertebral arteries: No proximal subclavian flow limiting stenosis. Right dominant vertebral artery. Both vertebral arteries are patent to the dura. There is limitation to visualization of the proximal vertebral lumen on both sides due to artifact from intravenous contrast. Skeleton: Cervical spine degeneration with C3-4 and C4-5 anterolisthesis. Other neck: Extensive intravenous reflux of contrast. There is a stenotic appearance of the left brachiocephalic vein. The bolus dispersion cause a sub optimal opacification  of arterial structures. Upper chest: Negative Review of the MIP images confirms the above findings CTA HEAD FINDINGS Anterior circulation: Atheromatous calcification at the carotid siphons. Hypoplastic left A1 segment with mildly larger right ICA. Atheromatous changes to medium vessels. There is a notable high-grade narrowing at the right A2 level. Severe narrowing of the left lower M2 branch seen on reformats. Posterior circulation: Right dominant vertebral artery. Atheromatous plaque at both V4 segments with 50-60% narrowing of the left V4 segment. The basilar artery is smooth and diffusely patent. Left P2 branch occlusion with subsequent reconstitution and then subsequent severe stenosis at the PCA bifurcation. Negative for aneurysm. Venous sinuses: Diffusely patent Anatomic variants: None significant Review of the MIP images confirms the above findings These results were called by telephone at the time of interpretation on 09/01/2020 at 7:48 am to provider [facility] , who verbally acknowledged these results. IMPRESSION: 1. Confirmed left PCA occlusion at the left P2 segment with downstream reconstitution  followed by a severe stenosis at the left PCA bifurcation. 2. Intracranial atherosclerosis with notable right A2, left M2, and left V4 segment stenoses. 3. Atherosclerosis in the neck without flow limiting stenosis. 4. Left brachiocephalic vein stenosis. Electronically Signed   By: Monte Fantasia M.D.   On: 09/01/2020 07:49   CT HEAD WO CONTRAST  Result Date: 09/01/2020 CLINICAL DATA:  Stroke follow-up. EXAM: CT HEAD WITHOUT CONTRAST TECHNIQUE: Contiguous axial images were obtained from the base of the skull through the vertex without intravenous contrast. COMPARISON:  Same day CT head. FINDINGS: Brain: Similar subtle hypodensity in the left temporal occipital cortex. Areas of subtle cortical hyperdensity described on the recent prior have resolved and likely represented contrast staining recent  catheter arteriogram. No evidence of new acute large vascular territory infarct. No acute hemorrhage. No progressive mass effect. No midline shift. Basal cisterns are patent. No hydrocephalus. No mass lesion. No visible extra-axial fluid collections. Vascular: Left PCA stent. Vasculature better characterized on same day catheter arteriogram. Skull: No acute fracture. Sinuses/Orbits: No acute abnormality. Other: Small left mastoid effusion. IMPRESSION: 1. Similar suspected small acute infarct in the left PCA territory. No progressive mass effect. MRI could further characterize if clinically indicated. 2. Areas of subtle cortical hyperdensity described on the recent prior have resolved and likely represented contrast staining from recent catheter arteriogram. No evidence of acute hemorrhage. Electronically Signed   By: Margaretha Sheffield MD   On: 09/01/2020 16:16   CT HEAD WO CONTRAST  Result Date: 09/01/2020 CLINICAL DATA:  Stroke follow-up post intervention. EXAM: CT HEAD WITHOUT CONTRAST TECHNIQUE: Contiguous axial images were obtained from the base of the skull through the vertex without intravenous contrast. COMPARISON:  Same day CT exams. FINDINGS: Brain: Similar subtle loaded in the left temporal occipital cortex. No evidence of mass occupying acute hemorrhage. Subtle/faint cortical versus extra-axial hyperdensity in the posteroinferior right temporal and occipital lobes (for example see series 6, image 29 and image 19). Evaluation for small volume subarachnoid hemorrhage is somewhat limited due to residual contrast within the vasculature. No hydrocephalus. No midline shift. Basal cisterns are patent. No mass lesion. Vascular: Residual contrast is noted within the vasculature from same day contrasted studies. Vasculature is better evaluated on same day catheter arteriogram. Skull: No acute fracture. Sinuses/Orbits: No acute findings. Other: Mastoid effusions. IMPRESSION: 1. No substantial change in small  acute infarct in the left occipital parietal cortex. No progressive mass effect 2. Subtle/faint cortical versus extra-axial hyperdensity in the posteroinferior right temporal and occipital lobes, which could represent contrast staining given recent catheter arteriogram versus trace hemorrhage. No mass occupying acute hemorrhage. Recommend attention on close follow-up. Electronically Signed   By: Margaretha Sheffield MD   On: 09/01/2020 12:49   CT Angio Neck W and/or Wo Contrast  Result Date: 09/01/2020 CLINICAL DATA:  Right-sided facial droop and weakness since 10 p.m. EXAM: CT ANGIOGRAPHY HEAD AND NECK TECHNIQUE: Multidetector CT imaging of the head and neck was performed using the standard protocol during bolus administration of intravenous contrast. Multiplanar CT image reconstructions and MIPs were obtained to evaluate the vascular anatomy. Carotid stenosis measurements (when applicable) are obtained utilizing NASCET criteria, using the distal internal carotid diameter as the denominator. CONTRAST:  74mL OMNIPAQUE IOHEXOL 350 MG/ML SOLN COMPARISON:  Noncontrast head CT from earlier today FINDINGS: CTA NECK FINDINGS Aortic arch: Atheromatous plaque.  Three vessel branching. Right carotid system: Atheromatous wall thickening diffusely. Calcified plaque at the bifurcation without flow limiting stenosis or ulceration. Left carotid system:  Circumferential atheromatous wall thickening. Calcified plaque at the bifurcation without ulceration or flow limiting stenosis. Vertebral arteries: No proximal subclavian flow limiting stenosis. Right dominant vertebral artery. Both vertebral arteries are patent to the dura. There is limitation to visualization of the proximal vertebral lumen on both sides due to artifact from intravenous contrast. Skeleton: Cervical spine degeneration with C3-4 and C4-5 anterolisthesis. Other neck: Extensive intravenous reflux of contrast. There is a stenotic appearance of the left  brachiocephalic vein. The bolus dispersion cause a sub optimal opacification of arterial structures. Upper chest: Negative Review of the MIP images confirms the above findings CTA HEAD FINDINGS Anterior circulation: Atheromatous calcification at the carotid siphons. Hypoplastic left A1 segment with mildly larger right ICA. Atheromatous changes to medium vessels. There is a notable high-grade narrowing at the right A2 level. Severe narrowing of the left lower M2 branch seen on reformats. Posterior circulation: Right dominant vertebral artery. Atheromatous plaque at both V4 segments with 50-60% narrowing of the left V4 segment. The basilar artery is smooth and diffusely patent. Left P2 branch occlusion with subsequent reconstitution and then subsequent severe stenosis at the PCA bifurcation. Negative for aneurysm. Venous sinuses: Diffusely patent Anatomic variants: None significant Review of the MIP images confirms the above findings These results were called by telephone at the time of interpretation on 09/01/2020 at 7:48 am to provider [facility] , who verbally acknowledged these results. IMPRESSION: 1. Confirmed left PCA occlusion at the left P2 segment with downstream reconstitution followed by a severe stenosis at the left PCA bifurcation. 2. Intracranial atherosclerosis with notable right A2, left M2, and left V4 segment stenoses. 3. Atherosclerosis in the neck without flow limiting stenosis. 4. Left brachiocephalic vein stenosis. Electronically Signed   By: Monte Fantasia M.D.   On: 09/01/2020 07:49   MR ANGIO HEAD WO CONTRAST  Result Date: 09/02/2020 CLINICAL DATA:  Stroke follow-up status post thrombectomy. EXAM: MRI HEAD WITHOUT CONTRAST MRA HEAD WITHOUT CONTRAST TECHNIQUE: Multiplanar, multiecho pulse sequences of the brain and surrounding structures were obtained without intravenous contrast. Angiographic images of the head were obtained using MRA technique without contrast. COMPARISON:  Head CT  09/01/2020 FINDINGS: MRI HEAD FINDINGS Brain: Left PCA territory acute ischemia involving the left occipital lobe, medial left temporal lobe and left thalamus. There is also punctate focus of abnormal diffusion restriction at the base of the right precentral gyrus. Focus of chronic microhemorrhage in the brainstem. No acute hemorrhage. There is multifocal hyperintense T2-weighted signal within the white matter. Parenchymal volume and CSF spaces are normal. The midline structures are normal. Vascular: Major flow voids are preserved. Skull and upper cervical spine: Normal calvarium and skull base. Visualized upper cervical spine and soft tissues are normal. Sinuses/Orbits:No paranasal sinus fluid levels or advanced mucosal thickening. No mastoid or middle ear effusion. Normal orbits. MRA HEAD FINDINGS POSTERIOR CIRCULATION: --Vertebral arteries: Normal --Inferior cerebellar arteries: Normal. --Basilar artery: Normal. --Superior cerebellar arteries: Normal. --Posterior cerebral arteries: There is now a stent in the left PCA P1 and P2 segments. There is normal distal flow related enhancement. Right PCA is normal. ANTERIOR CIRCULATION: --Intracranial internal carotid arteries: Normal. --Anterior cerebral arteries (ACA): Normal. --Middle cerebral arteries (MCA): Severe stenosis of the proximal M2 segment inferior division of the left MCA. No stenosis of the right MCA. ANATOMIC VARIANTS: None IMPRESSION: 1. Left PCA territory acute ischemia involving the left occipital lobe, medial left temporal lobe and left thalamus. No acute hemorrhage or mass effect. 2. Punctate focus of acute ischemia at the base  of the right precentral gyrus. 3. Severe stenosis of the proximal M2 segment inferior division of the left MCA. 4. Status post stent placement of left PCA P1 and P2 segments with normal distal flow related enhancement. Electronically Signed   By: Ulyses Jarred M.D.   On: 09/02/2020 03:38   MR BRAIN WO CONTRAST  Result  Date: 09/02/2020 CLINICAL DATA:  Stroke follow-up status post thrombectomy. EXAM: MRI HEAD WITHOUT CONTRAST MRA HEAD WITHOUT CONTRAST TECHNIQUE: Multiplanar, multiecho pulse sequences of the brain and surrounding structures were obtained without intravenous contrast. Angiographic images of the head were obtained using MRA technique without contrast. COMPARISON:  Head CT 09/01/2020 FINDINGS: MRI HEAD FINDINGS Brain: Left PCA territory acute ischemia involving the left occipital lobe, medial left temporal lobe and left thalamus. There is also punctate focus of abnormal diffusion restriction at the base of the right precentral gyrus. Focus of chronic microhemorrhage in the brainstem. No acute hemorrhage. There is multifocal hyperintense T2-weighted signal within the white matter. Parenchymal volume and CSF spaces are normal. The midline structures are normal. Vascular: Major flow voids are preserved. Skull and upper cervical spine: Normal calvarium and skull base. Visualized upper cervical spine and soft tissues are normal. Sinuses/Orbits:No paranasal sinus fluid levels or advanced mucosal thickening. No mastoid or middle ear effusion. Normal orbits. MRA HEAD FINDINGS POSTERIOR CIRCULATION: --Vertebral arteries: Normal --Inferior cerebellar arteries: Normal. --Basilar artery: Normal. --Superior cerebellar arteries: Normal. --Posterior cerebral arteries: There is now a stent in the left PCA P1 and P2 segments. There is normal distal flow related enhancement. Right PCA is normal. ANTERIOR CIRCULATION: --Intracranial internal carotid arteries: Normal. --Anterior cerebral arteries (ACA): Normal. --Middle cerebral arteries (MCA): Severe stenosis of the proximal M2 segment inferior division of the left MCA. No stenosis of the right MCA. ANATOMIC VARIANTS: None IMPRESSION: 1. Left PCA territory acute ischemia involving the left occipital lobe, medial left temporal lobe and left thalamus. No acute hemorrhage or mass effect. 2.  Punctate focus of acute ischemia at the base of the right precentral gyrus. 3. Severe stenosis of the proximal M2 segment inferior division of the left MCA. 4. Status post stent placement of left PCA P1 and P2 segments with normal distal flow related enhancement. Electronically Signed   By: Ulyses Jarred M.D.   On: 09/02/2020 03:38   CT CEREBRAL PERFUSION W CONTRAST  Result Date: 09/01/2020 CLINICAL DATA:  Right-sided facial droop and weakness. Left P2 occlusion on CTA. EXAM: CT PERFUSION BRAIN TECHNIQUE: Multiphase CT imaging of the brain was performed following IV bolus contrast injection. Subsequent parametric perfusion maps were calculated using RAPID software. CONTRAST:  34mL OMNIPAQUE IOHEXOL 350 MG/ML SOLN COMPARISON:  Head and neck CTA earlier today FINDINGS: CT Brain Perfusion Findings: CBF (<30%) Volume: 0 mL Perfusion (Tmax>6.0s) volume: 42 mL Mismatch Volume: 42 mL Infarction Location: The small acute left temporo-occipital infarct on today's noncontrast CT was not detected by automated CTP processing. There is penumbra in the left PCA territory although the reported mismatch volume also includes tissue in both cerebellar hemispheres. IMPRESSION: Left PCA penumbra which is smaller than the reported 42 mL mismatch volume as that also includes bilateral cerebellar tissue. These results were communicated to Dr. Rory Percy at 8:43 am on 09/01/2020 by text page via the [facility] messaging system. Electronically Signed   By: Logan Bores M.D.   On: 09/01/2020 08:47   DG CHEST PORT 1 VIEW  Result Date: 09/02/2020 CLINICAL DATA:  Stroke. EXAM: PORTABLE CHEST 1 VIEW COMPARISON:  09/01/2020.  07/18/2018  FINDINGS: Interim extubation and removal of NG tube. Stable cardiomegaly. No pulmonary venous congestion. No focal infiltrate. Questionable density right upper lung possibly a small area of atelectasis. Follow-up PA and lateral chest x-ray suggested pulmonary lesion. Tiny calcified pulmonary nodule in the right  again noted consistent with calcified granuloma. Prominent hiatal hernia again noted. Degenerative changes scoliosis thoracic spine. IMPRESSION: 1.  Interim extubation and removal of NG tube. 2.  Stable cardiomegaly. 3. Questionable density right upper lung, possibly a small area of atelectasis. Follow-up PA lateral chest x-ray suggested to exclude a focal pulmonary lesion. 4.  Prominent hiatal hernia again noted. Electronically Signed   By: Marcello Moores  Register   On: 09/02/2020 06:29   DG Chest Port 1 View  Result Date: 09/01/2020 CLINICAL DATA:  Endotracheal tube placement. EXAM: PORTABLE CHEST 1 VIEW COMPARISON:  July 18, 2018. FINDINGS: The heart size and mediastinal contours are within normal limits. Endotracheal tube is in grossly good position. Nasogastric tube is looped within the hiatal hernia. No pneumothorax is noted. Left lung is clear. Mild right basilar subsegmental atelectasis is noted. The visualized skeletal structures are unremarkable. IMPRESSION: Endotracheal tube in grossly good position. Nasogastric tube tip is seen within the hiatal hernia. Mild right basilar subsegmental atelectasis. Electronically Signed   By: Marijo Conception M.D.   On: 09/01/2020 16:26   ECHOCARDIOGRAM COMPLETE  Result Date: 09/01/2020    ECHOCARDIOGRAM REPORT   Patient Name:   Deanna Williams [facility] Date of Exam: 09/01/2020 Medical Rec #:  [id number]       Height:       63.0 in Accession #:    [id number]      Weight:       150.0 lb Date of Birth:  1928/07/25       BSA:          32.711 m Patient Age:    75 years        BP:           133/60 mmHg Patient Gender: F               HR:           86 bpm. Exam Location:  Inpatient Procedure: 2D Echo, Color Doppler and Cardiac Doppler Indications:    Stroke I63.9  History:        Patient has no prior history of Echocardiogram examinations.                 Risk Factors:Hypertension and Dyslipidemia.  Sonographer:    Darlina Sicilian RDCS Referring Phys: [id number] [facility]  Sonographer  Comments: Suboptimal parasternal window, suboptimal apical window and echo performed with patient supine and on artificial respirator. IMPRESSIONS  1. Left ventricular ejection fraction, by estimation, is 60 to 65%. The left ventricle has normal function. The left ventricle has no regional wall motion abnormalities. Left ventricular diastolic parameters are consistent with Grade I diastolic dysfunction (impaired relaxation).  2. Right ventricular systolic function is normal. The right ventricular size is normal. There is mildly elevated pulmonary artery systolic pressure.  3. The mitral valve is normal in structure. No evidence of mitral valve regurgitation. No evidence of mitral stenosis.  4. The aortic valve is normal in structure. Aortic valve regurgitation is not visualized. No aortic stenosis is present.  5. The inferior vena cava is normal in size with greater than 50% respiratory variability, suggesting right atrial pressure of 3 mmHg. FINDINGS  Left Ventricle: Left ventricular ejection fraction, by estimation, is  60 to 65%. The left ventricle has normal function. The left ventricle has no regional wall motion abnormalities. The left ventricular internal cavity size was normal in size. There is  no left ventricular hypertrophy. Left ventricular diastolic parameters are consistent with Grade I diastolic dysfunction (impaired relaxation). Normal left ventricular filling pressure. Right Ventricle: The right ventricular size is normal. No increase in right ventricular wall thickness. Right ventricular systolic function is normal. There is mildly elevated pulmonary artery systolic pressure. The tricuspid regurgitant velocity is 2.84  m/s, and with an assumed right atrial pressure of 8 mmHg, the estimated right ventricular systolic pressure is 0000000 mmHg. Left Atrium: Left atrial size was normal in size. Right Atrium: Right atrial size was normal in size. Pericardium: There is no evidence of pericardial effusion.  Mitral Valve: The mitral valve is normal in structure. No evidence of mitral valve regurgitation. No evidence of mitral valve stenosis. Tricuspid Valve: The tricuspid valve is normal in structure. Tricuspid valve regurgitation is mild . No evidence of tricuspid stenosis. Aortic Valve: The aortic valve is normal in structure. Aortic valve regurgitation is not visualized. No aortic stenosis is present. Pulmonic Valve: The pulmonic valve was normal in structure. Pulmonic valve regurgitation is not visualized. No evidence of pulmonic stenosis. Aorta: The aortic root is normal in size and structure. Venous: IVC assessment for right atrial pressure unable to be performed due to mechanical ventilation. The inferior vena cava is normal in size with greater than 50% respiratory variability, suggesting right atrial pressure of 3 mmHg. IAS/Shunts: No atrial level shunt detected by color flow Doppler.  LEFT VENTRICLE PLAX 2D LVIDd:         3.80 cm  Diastology LVIDs:         2.30 cm  LV e' medial:    6.31 cm/s LV PW:         0.90 cm  LV E/e' medial:  9.4 LV IVS:        0.90 cm  LV e' lateral:   9.14 cm/s LVOT diam:     2.00 cm  LV E/e' lateral: 6.5 LV SV:         54 LV SV Index:   32 LVOT Area:     3.14 cm  RIGHT VENTRICLE RV S prime:     12.90 cm/s LEFT ATRIUM             Index LA diam:        3.30 cm 1.93 cm/m LA Vol (A2C):   30.6 ml 17.88 ml/m LA Vol (A4C):   47.0 ml 27.47 ml/m LA Biplane Vol: 40.8 ml 23.84 ml/m  AORTIC VALVE LVOT Vmax:   95.80 cm/s LVOT Vmean:  55.500 cm/s LVOT VTI:    0.172 m  AORTA Ao Root diam: 2.90 cm MITRAL VALVE               TRICUSPID VALVE MV Area (PHT): 5.54 cm    TR Peak grad:   32.3 mmHg MV Decel Time: 137 msec    TR Vmax:        284.00 cm/s MV E velocity: 59.60 cm/s MV A velocity: 87.00 cm/s  SHUNTS MV E/A ratio:  0.69        Systemic VTI:  0.17 m                            Systemic Diam: 2.00 cm Dani Gobble Croitoru MD Electronically signed by Sanda Klein MD  Signature Date/Time:  09/01/2020/2:22:50 PM    Final    CT HEAD CODE STROKE WO CONTRAST  Result Date: 09/01/2020 CLINICAL DATA:  Code stroke. Right-sided facial droop and weakness since 10 p.m. EXAM: CT HEAD WITHOUT CONTRAST TECHNIQUE: Contiguous axial images were obtained from the base of the skull through the vertex without intravenous contrast. COMPARISON:  Brain MRI 08/23/2013 FINDINGS: Brain: Subtle low-density in the left temporal occipital cortex. No other visible infarct. No hemorrhage, hydrocephalus, or collection. Vascular: Atheromatous calcification. Especially on coronal imaging the left P 2 or P3 segment appears dense Skull: Normal. Negative for fracture or focal lesion. Sinuses/Orbits: Negative Other: These results were called by telephone at the time of interpretation on 09/01/2020 at 7:04 am to provider Dr Charna Archer, who verbally acknowledged these results. IMPRESSION: 1. Small acute infarct in the left occipital parietal cortex. The adjacent PCA is hyperdense on reformats, suggesting acute thrombosis. The visible infarct is smaller than would be expected for the level of vessel hyperdensity, consider CTA/perfusion if a treatment candidate. 2. No intracranial hemorrhage. Electronically Signed   By: Monte Fantasia M.D.   On: 09/01/2020 07:08      Assessment/Plan: Diagnosis: Left PCA stroke involving occipital and temporal lobes and thalamus 11. Does the need for close, 24 hr/day medical supervision in concert with the patient's rehab needs make it unreasonable for this patient to be served in a less intensive setting? Yes 12. Co-Morbidities requiring supervision/potential complications: 1. Diffuse weakness: will require intensive PT and OT 2. GERD 3. HTN: monitor BP TID 4. HLD 5. Prediabetes: monitor CBGs 13. Due to bladder management, bowel management, safety, skin/wound care, disease management, medication administration, pain management and patient education, does the patient require 24 hr/day rehab  nursing? Yes 14. Does the patient require coordinated care of a physician, rehab nurse, therapy disciplines of PT, OT to address physical and functional deficits in the context of the above medical diagnosis(es)? Yes Addressing deficits in the following areas: balance, endurance, locomotion, strength, transferring, bowel/bladder control, bathing, dressing, feeding, grooming, toileting and psychosocial support 15. Can the patient actively participate in an intensive therapy program of at least 3 hrs of therapy per day at least 5 days per week? Yes 16. The potential for patient to make measurable gains while on inpatient rehab is excellent 17. Anticipated functional outcomes upon discharge from inpatient rehab are modified independent  with PT, modified independent with OT, independent with SLP. 18. Estimated rehab length of stay to reach the above functional goals is: 10-14 days 19. Anticipated discharge destination: Home 20. Overall Rehab/Functional Prognosis: excellent  RECOMMENDATIONS: This patient's condition is appropriate for continued rehabilitative care in the following setting: CIR Patient has agreed to participate in recommended program. Yes Note that insurance prior authorization may be required for reimbursement for recommended care.  Comment: Thank you for this consult. Admission coordinator to follow.   I have personally performed a face to face diagnostic evaluation, including, but not limited to relevant history and physical exam findings, of this patient and developed relevant assessment and plan.  Additionally, I have reviewed and concur with the physician assistant's documentation above.  Leeroy Cha, MD  Bary Leriche, PA-C 09/02/2020 Expand All Collapse All

## 2020-09-06 NOTE — Evaluation (Signed)
Occupational Therapy Assessment and Plan  Patient Details  Name: Deanna Williams MRN: [id number] Date of Birth: [birth year]  OT Diagnosis: hemiplegia affecting non-dominant side and muscle weakness (generalized) Rehab Potential: Rehab Potential (ACUTE ONLY): Good ELOS: 7-10 days   Today's Date: 09/06/2020 OT Individual Time: 1350-1450 OT Individual Time Calculation (min): 60 min     Hospital Problem: Principal Problem:   Acute ischemic left PCA stroke [facility])   Past Medical History:  Past Medical History:  Diagnosis Date  . Gastroesophageal reflux disease   . Hyperlipidemia   . Hypertension   . Lichen sclerosus   . Lichen sclerosus et atrophicus   . Osteopenia   . Palpitations    Past Surgical History:  Past Surgical History:  Procedure Laterality Date  . CARPAL TUNNEL RELEASE    . FOOT SURGERY    . HYSTEROSCOPY WITH D & C N/A 11/02/2015   Procedure: DILATATION AND CURETTAGE /HYSTEROSCOPY;  Surgeon: Brayton Mars, MD;  Location: [facility] ORS;  Service: Gynecology;  Laterality: N/A;  . IR CT HEAD LTD  09/01/2020  . IR INTRA CRAN STENT  09/01/2020  . IR PERCUTANEOUS ART THROMBECTOMY/INFUSION INTRACRANIAL INC DIAG ANGIO  09/01/2020  . RADIOLOGY WITH ANESTHESIA N/A 09/01/2020   Procedure: IR WITH ANESTHESIA;  Surgeon: Radiologist, Medication, MD;  Location: [place];  Service: Radiology;  Laterality: N/A;    Assessment & Plan Clinical Impression: Deanna Williams is an 87 year old female with history of HTN, GERD, Lichen sclerosus who was admitted on 09/01/20 with right sided weakness and numbness. She was found to have left occipital parietal cortex infarct with hyperdense signal suggesting acute thrombosis. She underwent cerebral angio with thrombectomy and rescue stent for reocclusion by Dr. Estanislado Pandy. Follow up MRI brain showed L-PCA ischemia involving occipital, medial temporal and thalamus. 2 D echo showed EF 60-65% with no wall abnormality.Dr. Erlinda Hong recommended Loop placement due  to concerns of embolic source of unknown source and she was found to have episode of A fib on 04/22. Eliquis recommended by Dr. Curt Bears with plans to follow up in office. She continues to have balance deficits, difficulty with higher level cognitive tasks as well as weakness affecting ADLs and mobility. CIR recommended due to functional decline.  Patient transferred to CIR on 09/05/2020 .    Patient currently requires min with basic self-care skills and IADL secondary to muscle weakness, decreased visual acuity, decreased memory and decreased standing balance, decreased postural control and hemiplegia.  Prior to hospitalization, patient could complete ADLs with independent .  Patient will benefit from skilled intervention to decrease level of assist with basic self-care skills and increase level of independence with iADL prior to discharge home independently.  Anticipate patient will require intermittent supervision and no further OT follow recommended.  OT - End of Session Activity Tolerance: Tolerates 30+ min activity with multiple rests Endurance Deficit: Yes Endurance Deficit Description: generalized weakness OT Assessment Rehab Potential (ACUTE ONLY): Good OT Patient demonstrates impairments in the following area(s): Balance;Cognition;Endurance;Motor;Safety OT Basic ADL's Functional Problem(s): Bathing;Dressing;Toileting OT Advanced ADL's Functional Problem(s): Simple Meal Preparation;Laundry;Light Housekeeping OT Transfers Functional Problem(s): Toilet;Tub/Shower OT Additional Impairment(s): None OT Plan OT Intensity: Minimum of 1-2 x/day, 45 to 90 minutes OT Frequency: 5 out of 7 days OT Duration/Estimated Length of Stay: 7-10 days OT Treatment/Interventions: Balance/vestibular training;DME/adaptive equipment instruction;Patient/family education;Therapeutic Activities;Therapeutic Exercise;Cognitive remediation/compensation;Psychosocial support;Community reintegration;Functional mobility  training;Self Care/advanced ADL retraining;UE/LE Strength taining/ROM;UE/LE Coordination activities;Discharge planning;Disease mangement/prevention;Pain management;Visual/perceptual remediation/compensation;Neuromuscular re-education OT Self Feeding Anticipated Outcome(s): mod I OT Basic  Self-Care Anticipated Outcome(s): mod I OT Toileting Anticipated Outcome(s): mod I OT Bathroom Transfers Anticipated Outcome(s): mod I OT Recommendation Patient destination: Home Follow Up Recommendations: None Equipment Recommended: None recommended by OT   OT Evaluation Precautions/Restrictions  Precautions Precautions: Fall Restrictions Weight Bearing Restrictions: No General Chart Reviewed: Yes Family/Caregiver Present: No Pain Pain Assessment Pain Scale: 0-10 Pain Score: 0-No pain Home Living/Prior Functioning Home Living Family/patient expects to be discharged to:: Private residence Living Arrangements: Alone Available Help at Discharge: Family Type of Home: Independent living facility Home Access: Level entry Home Layout: One level Bathroom Shower/Tub: Multimedia programmer: Handicapped height Bathroom Accessibility: Yes Additional Comments: Pt lives in a retirement community with her yorkie  Lives With: Alone IADL History Homemaking Responsibilities: Yes Meal Prep Responsibility: Therapist, occupational Responsibility: Primary Cleaning Responsibility: Public house manager Paying/Finance Responsibility: Primary Shopping Responsibility: Primary Current License: Yes Mode of Transportation: Public relations account executive: high school Occupation: Retired Leisure and Hobbies: Spending time with family, collecting dolls Prior Function Level of Independence: Independent with gait,Independent with transfers,Independent with basic ADLs  Able to Take Stairs?: Yes Driving: Yes Vocation: Retired Comments: drives, grocery shops, cooks, walks her dog Vision Baseline Vision/History: Wears glasses Wears  Glasses: At all times Patient Visual Report: No change from baseline Vision Assessment?: Yes Eye Alignment: Impaired (comment) Ocular Range of Motion: Within Functional Limits Alignment/Gaze Preference: Within Defined Limits Tracking/Visual Pursuits: Requires cues, head turns, or add eye shifts to track Saccades: Additional head turns occurred during testing Visual Fields: No apparent deficits Perception  Perception: Within Functional Limits Praxis Praxis: Intact Cognition Overall Cognitive Status: Impaired/Different from baseline Arousal/Alertness: Awake/alert Orientation Level: Person;Place;Situation Person: Oriented Place: Oriented Situation: Oriented Year: 2022 Month: April Day of Week: Correct Memory: Impaired Memory Impairment: Decreased recall of new information Decreased Short Term Memory: Verbal complex Immediate Memory Recall: Sock;Blue;Bed Memory Recall Sock: Not able to recall Memory Recall Blue: Without Cue Memory Recall Bed: Without Cue Attention: Selective Focused Attention: Appears intact Sustained Attention: Appears intact Selective Attention: Appears intact Awareness: Appears intact Problem Solving: Impaired Problem Solving Impairment: Functional complex Safety/Judgment: Appears intact Sensation Sensation Light Touch: Impaired Detail Light Touch Impaired Details: Impaired RLE;Impaired LLE (reports tingling in her R hand (fingers 2-4 especially)) Proprioception: Impaired Detail Proprioception Impaired Details: Impaired RLE Coordination Gross Motor Movements are Fluid and Coordinated: No Fine Motor Movements are Fluid and Coordinated: No Coordination and Movement Description: mild R hemi Motor  Motor Motor: Hemiplegia;Abnormal postural alignment and control Motor - Skilled Clinical Observations: mild R hemi  Trunk/Postural Assessment  Cervical Assessment Cervical Assessment: Exceptions to [facility] (forward head) Thoracic Assessment Thoracic  Assessment: Exceptions to [facility] (kyphotic posture) Lumbar Assessment Lumbar Assessment: Exceptions to [facility] (posterior pelvic tilt) Postural Control Postural Control: Deficits on evaluation Righting Reactions: delayed  Balance Balance Balance Assessed: Yes Static Sitting Balance Static Sitting - Balance Support: No upper extremity supported;Feet supported Static Sitting - Level of Assistance: 6: Modified independent (Device/Increase time) Dynamic Sitting Balance Dynamic Sitting - Balance Support: Feet supported;No upper extremity supported;During functional activity Dynamic Sitting - Level of Assistance: 5: Stand by assistance Static Standing Balance Static Standing - Balance Support: Bilateral upper extremity supported;During functional activity Static Standing - Level of Assistance: 4: Min assist;5: Stand by assistance Dynamic Standing Balance Dynamic Standing - Balance Support: Bilateral upper extremity supported;During functional activity Dynamic Standing - Level of Assistance: 4: Min assist Extremity/Trunk Assessment RUE Assessment RUE Assessment: Exceptions to [facility] General Strength Comments: Full AROM, 3+/5 LUE Assessment LUE Assessment: Within Functional Limits  Care Tool Care Tool Self Care Eating   Eating Assist Level: Supervision/Verbal cueing    Oral Care    Oral Care Assist Level: Supervision/Verbal cueing    Bathing   Body parts bathed by patient: Right arm;Left arm;Chest;Abdomen;Front perineal area;Right upper leg;Buttocks;Left upper leg;Right lower leg;Left lower leg;Face     Assist Level: Minimal Assistance - Patient > 75%    Upper Body Dressing(including orthotics)   What is the patient wearing?: Pull over shirt   Assist Level: Supervision/Verbal cueing    Lower Body Dressing (excluding footwear)   What is the patient wearing?: Underwear/pull up;Pants Assist for lower body dressing: Contact Guard/Touching assist    Putting on/Taking off footwear   What  is the patient wearing?: Non-skid slipper socks Assist for footwear: Supervision/Verbal cueing       Care Tool Toileting Toileting activity   Assist for toileting: Contact Guard/Touching assist     Care Tool Bed Mobility Roll left and right activity   Roll left and right assist level: Independent    Sit to lying activity   Sit to lying assist level: Supervision/Verbal cueing    Lying to sitting edge of bed activity   Lying to sitting edge of bed assist level: Supervision/Verbal cueing     Care Tool Transfers Sit to stand transfer   Sit to stand assist level: Contact Guard/Touching assist    Chair/bed transfer   Chair/bed transfer assist level: Contact Guard/Touching assist     Toilet transfer   Assist Level: Contact Guard/Touching assist     Care Tool Cognition Expression of Ideas and Wants Expression of Ideas and Wants: Without difficulty (complex and basic) - expresses complex messages without difficulty and with speech that is clear and easy to understand   Understanding Verbal and Non-Verbal Content Understanding Verbal and Non-Verbal Content: Understands (complex and basic) - clear comprehension without cues or repetitions   Memory/Recall Ability *first 3 days only Memory/Recall Ability *first 3 days only: Current season;Location of own room;Staff names and faces;That he or she is in a hospital/hospital unit    Refer to Care Plan for [place] 1 OT Short Term Goal 1 (Week 1): STG= LTG d/t ELOS  Recommendations for other services: None    Skilled Therapeutic Intervention  Skilled OT evaluation completed with results above. ADL and mobility detailed below. Pt edu on CVA recovery, OT POC, and CIR expectations. Pt very pleasant and motivated to participate. Pt limited by mild R hemi, postural control/balance deficits, and mild STM deficits. Goals set to mod I to reflect return to independent living. Pt supine upon exit with all needs met,  bed alarm set.   ADL ADL Eating: Supervision/safety Where Assessed-Eating: Edge of bed Grooming: Contact guard Where Assessed-Grooming: Standing at sink Upper Body Bathing: Supervision/safety Where Assessed-Upper Body Bathing: Shower;Chair Lower Body Bathing: Minimal assistance Where Assessed-Lower Body Bathing: Shower Upper Body Dressing: Supervision/safety Where Assessed-Upper Body Dressing: Standing at sink Lower Body Dressing: Contact guard Where Assessed-Lower Body Dressing: Standing at sink;Sitting at sink Toileting: Contact guard Where Assessed-Toileting: Glass blower/designer: Therapist, music Method: Librarian, academic: Walk in Facilities manager Transfer: Minimal cueing;Minimal assistance Social research officer, government Method: Heritage manager: Shower seat without back Mobility  Bed Mobility Bed Mobility: Rolling Right;Rolling Left;Supine to Sit;Sit to Supine Rolling Right: Independent Rolling Left: Independent Supine to Sit: Supervision/Verbal cueing Sit to Supine: Supervision/Verbal cueing Transfers Sit to Stand: Contact Guard/Touching assist   Discharge Criteria:  Patient will be discharged from OT if patient refuses treatment 3 consecutive times without medical reason, if treatment goals not met, if there is a change in medical status, if patient makes no progress towards goals or if patient is discharged from hospital.  The above assessment, treatment plan, treatment alternatives and goals were discussed and mutually agreed upon: by patient  Curtis Sites 09/06/2020, 2:58 PM

## 2020-09-07 DIAGNOSIS — I63532 Cerebral infarction due to unspecified occlusion or stenosis of left posterior cerebral artery: Secondary | ICD-10-CM

## 2020-09-07 LAB — CBC WITH DIFFERENTIAL/PLATELET
Abs Immature Granulocytes: 0.03 10*3/uL (ref 0.00–0.07)
Basophils Absolute: 0 10*3/uL (ref 0.0–0.1)
Basophils Relative: 0 %
Eosinophils Absolute: 0.2 10*3/uL (ref 0.0–0.5)
Eosinophils Relative: 3 %
HCT: 36.8 % (ref 36.0–46.0)
Hemoglobin: 12.2 g/dL (ref 12.0–15.0)
Immature Granulocytes: 1 %
Lymphocytes Relative: 20 %
Lymphs Abs: 1.1 10*3/uL (ref 0.7–4.0)
MCH: 31 pg (ref 26.0–34.0)
MCHC: 33.2 g/dL (ref 30.0–36.0)
MCV: 93.6 fL (ref 80.0–100.0)
Monocytes Absolute: 0.6 10*3/uL (ref 0.1–1.0)
Monocytes Relative: 11 %
Neutro Abs: 3.8 10*3/uL (ref 1.7–7.7)
Neutrophils Relative %: 65 %
Platelets: 246 10*3/uL (ref 150–400)
RBC: 3.93 MIL/uL (ref 3.87–5.11)
RDW: 12.3 % (ref 11.5–15.5)
WBC: 5.8 10*3/uL (ref 4.0–10.5)
nRBC: 0 % (ref 0.0–0.2)

## 2020-09-07 LAB — COMPREHENSIVE METABOLIC PANEL
ALT: 14 U/L (ref 0–44)
AST: 14 U/L — ABNORMAL LOW (ref 15–41)
Albumin: 3 g/dL — ABNORMAL LOW (ref 3.5–5.0)
Alkaline Phosphatase: 66 U/L (ref 38–126)
Anion gap: 5 (ref 5–15)
BUN: 19 mg/dL (ref 8–23)
CO2: 26 mmol/L (ref 22–32)
Calcium: 8.9 mg/dL (ref 8.9–10.3)
Chloride: 103 mmol/L (ref 98–111)
Creatinine, Ser: 0.89 mg/dL (ref 0.44–1.00)
GFR, Estimated: >60 mL/min (ref >60)
Glucose, Bld: 109 mg/dL — ABNORMAL HIGH (ref 70–99)
Potassium: 4 mmol/L (ref 3.5–5.1)
Sodium: 134 mmol/L — ABNORMAL LOW (ref 135–145)
Total Bilirubin: 0.8 mg/dL (ref 0.3–1.2)
Total Protein: 5.4 g/dL — ABNORMAL LOW (ref 6.5–8.1)

## 2020-09-07 MED ORDER — POLYETHYLENE GLYCOL 3350 17 G PO PACK
17.0000 g | PACK | Freq: Every day | ORAL | Status: DC
  Administered 2020-09-07 – 2020-09-10 (×4): 17 g via ORAL
  Filled 2020-09-07 (×5): qty 1

## 2020-09-07 NOTE — Progress Notes (Signed)
Patient information reviewed and entered into eRehab System by Becky Charlena Haub, PPS coordinator. Information including medical coding, function ability, and quality indicators will be reviewed and updated through discharge.   

## 2020-09-07 NOTE — Progress Notes (Signed)
Inpatient Rehabilitation Care Coordinator Assessment and Plan Patient Details  Name: Deanna Williams MRN: [id number] Date of Birth: Apr 08, 1929  Today's Date: 09/07/2020  Hospital Problems: Principal Problem:   Acute ischemic left PCA stroke [facility])  Past Medical History:  Past Medical History:  Diagnosis Date  . Gastroesophageal reflux disease   . Hyperlipidemia   . Hypertension   . Lichen sclerosus   . Lichen sclerosus et atrophicus   . Osteopenia   . Palpitations    Past Surgical History:  Past Surgical History:  Procedure Laterality Date  . CARPAL TUNNEL RELEASE    . FOOT SURGERY    . HYSTEROSCOPY WITH D & C N/A 11/02/2015   Procedure: DILATATION AND CURETTAGE /HYSTEROSCOPY;  Surgeon: Brayton Mars, MD;  Location: [facility] ORS;  Service: Gynecology;  Laterality: N/A;  . IR CT HEAD LTD  09/01/2020  . IR INTRA CRAN STENT  09/01/2020  . IR PERCUTANEOUS ART THROMBECTOMY/INFUSION INTRACRANIAL INC DIAG ANGIO  09/01/2020  . RADIOLOGY WITH ANESTHESIA N/A 09/01/2020   Procedure: IR WITH ANESTHESIA;  Surgeon: Radiologist, Medication, MD;  Location: [place] [facility] [place];  Service: Radiology;  Laterality: N/A;   Social History:  reports that she has never smoked. She has never used smokeless tobacco. She reports current alcohol use. She reports that she does not use drugs.  Family / Support Systems Marital Status: Widow/Widower Children: Gardiner Fanti Other Supports: Derrill Kay, Lattie Haw (granddaughter in Sports coach) Anticipated Caregiver: [place] daughter and can hire additonal assistance Ability/Limitations of Caregiver: none Caregiver Availability: 24/7 (Granddaughter can stay with pt in ILF)  Social History Preferred language: English Religion: None Read: Yes Write: Yes Employment Status: Retired Public relations account executive Issues: n/a Guardian/Conservator: n/a   Abuse/Neglect Abuse/Neglect Assessment Can Be Completed: Yes Physical Abuse: Denies Verbal Abuse: Denies Sexual Abuse:  Denies Exploitation of patient/patient's resources: Denies Self-Neglect: Denies  Emotional Status Recent Psychosocial Issues: no Psychiatric History: no Substance Abuse History: no  Patient / Family Perceptions, Expectations & Goals Pt/Family understanding of illness & functional limitations: yes Premorbid pt/family roles/activities: Pt lives in [place], went out daily, driving and completing all task (shopping coooking, walking dog) Anticipated changes in roles/activities/participation: Patient granddaughter able to stay in pt apartment and assist with task Pt/family expectations/goals: [place]: None Premorbid Home Care/DME Agencies: None Transportation available at discharge: Family able to arrange transportation  Discharge Planning Living Arrangements: [place]: Children Type of Residence: [place] ([place]) Administrator, sports: Multimedia programmer (specify) ([employer]) Financial Resources: Social Security Financial Screen Referred: No Living Expenses: Rent Money Management: Patient Does the patient have any problems obtaining your medications?: No Home Management: Pt independent with med man. Patient/Family Preliminary Plans: Pt granddaughter to assist with medication management Care Coordinator Anticipated Follow Up Needs: ALF/IL DC Planning Additional Notes/Comments: Pt d/c back to ILF Expected length of stay: 7-10 Days  Clinical Impression Sw met pt and called son at beside. Spouse prefers his spouse be called to be provided updates, sw changed daughter in law to primary in chart. Pt and Pt family plan for pt to return back to ILF pt has been there 8 years and familiar with the area. Pt grand daughter able to stay with patient at [place] or family able to hire assistance. Sw will continue to follow up with additional questions or concerns.   Dyanne Iha 09/07/2020,  12:49 PM

## 2020-09-07 NOTE — Progress Notes (Signed)
PROGRESS NOTE   Subjective/Complaints: Very pleasant Had a good weekend Feels she is progressing well.  Still without BM  ROS: Patient denies fever, rash, sore throat, blurred vision, nausea, vomiting, diarrhea, cough, shortness of breath or chest pain, joint or back pain, headache, or mood change. +constipation   Objective:   No results found. Recent Labs    09/05/20 0314 09/07/20 0612  WBC 6.7 5.8  HGB 12.9 12.2  HCT 38.2 36.8  PLT 225 246   Recent Labs    09/05/20 0314 09/07/20 0612  NA 136 134*  K 4.3 4.0  CL 106 103  CO2 23 26  GLUCOSE 128* 109*  BUN 13 19  CREATININE 0.74 0.89  CALCIUM 9.2 8.9    Intake/Output Summary (Last 24 hours) at 09/07/2020 1133 Last data filed at 09/07/2020 5573 Gross per 24 hour  Intake 1017 ml  Output --  Net 1017 ml        Physical Exam: Vital Signs Blood pressure (!) 110/51, pulse 79, temperature 97.8 F (36.6 C), temperature source Oral, resp. rate 18, height 5\' 2"  (1.575 m), weight 66.2 kg, SpO2 95 %. Gen: no distress, normal appearing HEENT: oral mucosa pink and moist, NCAT Cardio: Reg rate Chest: normal effort, normal rate of breathing Abd: soft, non-distended Ext: no edema Psych: pleasant, normal affect Skin: intact Neuro: Alert and oriented x 3. Normal insight and awareness. Intact Memory. Normal language and speech. Cranial nerve exam unremarkable. RUE 4/5 prox to distal. RLE 3-4/5 prox to distal. LUE 4+/5. LLE 3+ to 4/5. Stocking glove sensory loss from knees to feet and bilateral hadns. DTR's 1+. No focal limb ataxia. No abnormal tone Musculoskeletal: Full ROM, No pain with AROM or PROM in the neck, trunk, or extremities. Posture appropriate    Assessment/Plan: 1. Functional deficits which require 3+ hours per day of interdisciplinary therapy in a comprehensive inpatient rehab setting.  Physiatrist is providing close team supervision and 24 hour  management of active medical problems listed below.  Physiatrist and rehab team continue to assess barriers to discharge/monitor patient progress toward functional and medical goals  Care Tool:  Bathing    Body parts bathed by patient: Right arm,Left arm,Chest,Abdomen,Front perineal area,Right upper leg,Buttocks,Left upper leg,Right lower leg,Left lower leg,Face         Bathing assist Assist Level: Contact Guard/Touching assist     Upper Body Dressing/Undressing Upper body dressing   What is the patient wearing?: Pull over shirt    Upper body assist Assist Level: Supervision/Verbal cueing    Lower Body Dressing/Undressing Lower body dressing      What is the patient wearing?: Underwear/pull up,Pants     Lower body assist Assist for lower body dressing: Supervision/Verbal cueing     Toileting Toileting    Toileting assist Assist for toileting: Contact Guard/Touching assist     Transfers Chair/bed transfer  Transfers assist     Chair/bed transfer assist level: Contact Guard/Touching assist     Locomotion Ambulation   Ambulation assist      Assist level: Contact Guard/Touching assist Assistive device: Walker-rolling Max distance: 150'   Walk 10 feet activity   Assist     Assist  level: Contact Guard/Touching assist Assistive device: Walker-rolling   Walk 50 feet activity   Assist    Assist level: Contact Guard/Touching assist Assistive device: Walker-rolling    Walk 150 feet activity   Assist    Assist level: Contact Guard/Touching assist Assistive device: Walker-rolling    Walk 10 feet on uneven surface  activity   Assist Walk 10 feet on uneven surfaces activity did not occur: Safety/medical concerns         Wheelchair     Assist Will patient use wheelchair at discharge?: No             Wheelchair 50 feet with 2 turns activity    Assist            Wheelchair 150 feet activity     Assist           Blood pressure (!) 110/51, pulse 79, temperature 97.8 F (36.6 C), temperature source Oral, resp. rate 18, height 5\' 2"  (1.575 m), weight 66.2 kg, SpO2 95 %.  Medical Problem List and Plan: 1.Functional deficits and mild right hemiparesissecondary to left PCA infarct -patient may shower -ELOS/Goals: 7-10 days, supervision to mod I with PT and OT  -Continue CIR therapies today including PT and OT  2. Antithrombotics: -Continue Eliquis -antiplatelet therapy: ASA/Brilinta 3. Pain Management:N/A 4. Mood:LCSW to follow for evaluation and support. -antipsychotic agents: N/A 5. Neuropsych: This patientiscapable of making decisions on herown behalf. 6. Skin/Wound Care:Routine pressure relief measures 7. Fluids/Electrolytes/Nutrition:Monitor I/O. Check lytes Monday. 8. HTN: BP labile, continue Norvasc daily 9. A fib: New diagnosis--monitor HR TID--now on Eliquis. -HR controlled 4/25 10. Prediabetes: Hgb A1C-5.9. Add CM restrictions and educate patient on CM diet.  11. L-PCA stent: On ASA, Brilinta and Lipitor. 12. Hyponatremia: monitor Na weekly.  13. Constipation: miralax daily warmed in prune juice    LOS: 2 days A FACE TO FACE EVALUATION WAS PERFORMED  Deanna Williams Sible Straley 09/07/2020, 11:33 AM

## 2020-09-07 NOTE — Progress Notes (Signed)
Inpatient [facility] Individual Statement of Services  Patient Name:  Deanna Williams  Date:  09/07/2020  Welcome to the [facility].  Our goal is to provide you with an individualized program based on your diagnosis and situation, designed to meet your specific needs.  With this comprehensive rehabilitation program, you will be expected to participate in at least 3 hours of rehabilitation therapies Monday-Friday, with modified therapy programming on the weekends.  Your rehabilitation program will include the following services:  Physical Therapy (PT), Occupational Therapy (OT), Speech Therapy (ST), 24 hour per day rehabilitation nursing, Therapeutic Recreaction (TR), Neuropsychology, Care Coordinator, Rehabilitation Medicine, Nutrition Services, Pharmacy Services and Other  Weekly team conferences will be held on Tuesdays to discuss your progress.  Your Inpatient Rehabilitation Care Coordinator will talk with you frequently to get your input and to update you on team discussions.  Team conferences with you and your family in attendance may also be held.  Expected length of stay: 7-10 Days  Overall anticipated outcome: Supervision  Depending on your progress and recovery, your program may change. Your Inpatient Rehabilitation Care Coordinator will coordinate services and will keep you informed of any changes. Your Inpatient Rehabilitation Care Coordinator's name and contact numbers are listed  below.  The following services may also be recommended but are not provided by the [facility]:    [place] will be made to provide these services after discharge if needed.  Arrangements include referral to agencies that provide these services.  Your insurance has been verified to be:  [employer]  Your primary doctor is:  Miguel Aschoff, MD  Pertinent  information will be shared with your doctor and your insurance company.  Inpatient Rehabilitation Care Coordinator:  Erlene Quan, [place] or [phone]  Information discussed with and copy given to patient by: Dyanne Iha, 09/07/2020, 10:40 AM

## 2020-09-07 NOTE — Discharge Instructions (Addendum)
Inpatient Rehab Discharge Instructions  Deanna Williams Discharge date and time:  09/11/20  Activities/Precautions/ Functional Status: Activity: no lifting, driving, or strenuous exercise till cleared by MD Diet: cardiac diet Wound Care: none needed    Functional status:  ___ No restrictions     ___ Walk up steps independently ___ 24/7 supervision/assistance   ___ Walk up steps with assistance _X__ Intermittent supervision/assistance  ___ Bathe/dress independently ___ Walk with walker     ___ Bathe/dress with assistance ___ Walk Independently    ___ Shower independently _X__ Walk with assistance    _X__ Shower with assistance _X__ No alcohol     ___ Return to work/school ________   COMMUNITY REFERRALS UPON DISCHARGE:    Home Health:   PT     OT                     Agency: [facility]  Phone: [phone]   Medical Equipment/Items Ordered: Vassie Moselle                                                 Agency/Supplier: [employer]   Special Instructions:   STROKE/TIA DISCHARGE INSTRUCTIONS SMOKING Cigarette smoking nearly doubles your risk of having a stroke & is the single most alterable risk factor  If you smoke or have smoked in the last 12 months, you are advised to quit smoking for your health.  Most of the excess cardiovascular risk related to smoking disappears within a year of stopping.  Ask you doctor about anti-smoking medications  [place] Quit Line: 1-800-QUIT NOW  Free Smoking Cessation Classes [phone]  CHOLESTEROL Know your levels; limit fat & cholesterol in your diet  Lipid Panel     Component Value Date/Time   CHOL 188 09/02/2020 0441   CHOL 205 (H) 01/21/2020 1041   TRIG 68 09/02/2020 0441   HDL 75 09/02/2020 0441   HDL 81 01/21/2020 1041   CHOLHDL 2.5 09/02/2020 0441   VLDL 14 09/02/2020 0441   LDLCALC 99 09/02/2020 0441   LDLCALC 110 (H) 01/21/2020 1041      Many patients benefit from treatment even if their cholesterol is  at goal.  Goal: Total Cholesterol (CHOL) less than 160  Goal:  Triglycerides (TRIG) less than 150  Goal:  HDL greater than 40  Goal:  LDL (LDLCALC) less than 100   BLOOD PRESSURE American Stroke Association blood pressure target is less that 120/80 mm/Hg  Your discharge blood pressure is:  BP: 133/69  Monitor your blood pressure  Limit your salt and alcohol intake  Many individuals will require more than one medication for high blood pressure  DIABETES (A1c is a blood sugar average for last 3 months) Goal HGBA1c is under 7% (HBGA1c is blood sugar average for last 3 months)  Diabetes: Pre diabetes    Lab Results  Component Value Date   HGBA1C 5.9 (H) 09/02/2020     Your HGBA1c can be lowered with medications, healthy diet, and exercise.  Check your blood sugar as directed by your physician  Call your physician if you experience unexplained or low blood sugars.  PHYSICAL ACTIVITY/REHABILITATION Goal is 30 minutes at least 4 days per week  Activity: No driving, Therapies: see above Return to work: N/A  Activity decreases your risk of heart attack and stroke and makes  your heart stronger.  It helps control your weight and blood pressure; helps you relax and can improve your mood.  Participate in a regular exercise program.  Talk with your doctor about the best form of exercise for you (dancing, walking, swimming, cycling).  DIET/WEIGHT Goal is to maintain a healthy weight  Your discharge diet is:  Diet Order            Diet heart healthy/carb modified Room service appropriate? Yes; Fluid consistency: Thin  Diet effective now                liquids Your height is:  Height: 5\' 2"  (157.5 cm) Your current weight is: Weight: 66.2 kg Your Body Mass Index (BMI) is:  BMI (Calculated): 25.86  Following the type of diet specifically designed for you will help prevent another stroke.  Your goal weight is   Your goal Body Mass Index (BMI) is 19-24.  Healthy food habits can  help reduce 3 risk factors for stroke:  High cholesterol, hypertension, and excess weight.  RESOURCES Stroke/Support Group:  Call [phone]   STROKE EDUCATION PROVIDED/REVIEWED AND GIVEN TO PATIENT Stroke warning signs and symptoms How to activate emergency medical system (call 911). Medications prescribed at discharge. Need for follow-up after discharge. Personal risk factors for stroke. Pneumonia vaccine given:  Flu vaccine given:  My questions have been answered, the writing is legible, and I understand these instructions.  I will adhere to these goals & educational materials that have been provided to me after my discharge from the hospital.     My questions have been answered and I understand these instructions. I will adhere to these goals and the provided educational materials after my discharge from the hospital.  Patient/Caregiver Signature _______________________________ Date __________  Clinician Signature _______________________________________ Date __________  Please bring this form and your medication list with you to all your follow-up doctor's appointments. Information on my medicine - ELIQUIS (apixaban)  Why was Eliquis prescribed for you? Eliquis was prescribed for you to reduce the risk of forming blood clots that can cause a stroke if you have a medical condition called atrial fibrillation (a type of irregular heartbeat) OR to reduce the risk of a blood clots forming after orthopedic surgery.  What do You need to know about Eliquis ? Take your Eliquis TWICE DAILY - one tablet in the morning and one tablet in the evening with or without food.  It would be best to take the doses about the same time each day.  If you have difficulty swallowing the tablet whole please discuss with your pharmacist how to take the medication safely.  Take Eliquis exactly as prescribed by your doctor and DO NOT stop taking Eliquis without talking to the doctor who prescribed the  medication.  Stopping may increase your risk of developing a new clot or stroke.  Refill your prescription before you run out.  After discharge, you should have regular check-up appointments with your healthcare provider that is prescribing your Eliquis.  In the future your dose may need to be changed if your kidney function or weight changes by a significant amount or as you get older.  What do you do if you miss a dose? If you miss a dose, take it as soon as you remember on the same day and resume taking twice daily.  Do not take more than one dose of ELIQUIS at the same time.  Important Safety Information A possible side effect of Eliquis is bleeding. You  should call your healthcare provider right away if you experience any of the following: ? Bleeding from an injury or your nose that does not stop. ? Unusual colored urine (red or dark brown) or unusual colored stools (red or black). ? Unusual bruising for unknown reasons. ? A serious fall or if you hit your head (even if there is no bleeding).  Some medicines may interact with Eliquis and might increase your risk of bleeding or clotting while on Eliquis. To help avoid this, consult your healthcare provider or pharmacist prior to using any new prescription or non-prescription medications, including herbals, vitamins, non-steroidal anti-inflammatory drugs (NSAIDs) and supplements.  This website has more information on Eliquis (apixaban): [email or web address].

## 2020-09-07 NOTE — NC FL2 (Signed)
[place] LEVEL OF CARE SCREENING TOOL     IDENTIFICATION  Patient Name: Deanna Williams Birthdate: 1928/10/18 Sex: female Admission Date (Current Location): 09/05/2020  [facility] and [place] Number:  Herbalist and Address:  [facility] [place], [place] [address], [place], [place] [address]      Provider Number:    Attending Physician Name and Address:  Izora Ribas, MD  Relative Name and Phone Number:  Kainz,Maynard:[phone]    Current Level of Care: ICF Recommended Level of Care: Other (Comment) (ILF) Prior Approval Number:    Date Approved/Denied:   PASRR Number:    Discharge Plan: ICF    Current Diagnoses: Patient Active Problem List   Diagnosis Date Noted  . Atrial fibrillation ([place]) 09/05/2020  . Acute ischemic left PCA stroke ([place]) 09/01/2020  . Stroke (cerebrum) ([place]) 09/01/2020  . Acute cerebrovascular accident (CVA) due to occlusion of right posterior cerebral artery ([place]) 09/01/2020  . Stroke ([place]) 09/01/2020  . Hyperglycemia 08/31/2016  . Endometrial polyp 11/11/2015  . Lichen sclerosus 74/25/9563  . Osteoporosis 08/11/2015  . Allergic rhinitis 04/14/2015  . Blood pressure elevated 04/14/2015  . 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 Acid reflux 04/14/2015  . 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 Hypercholesterolemia 04/14/2015  . BP (high blood pressure) 04/14/2015  . Arthritis, degenerative 04/14/2015  . Osteopenia 04/14/2015  . Awareness of heartbeats 04/14/2015    Orientation RESPIRATION BLADDER Height & Weight     Self,Time,Situation,Place  Normal Continent Weight: 145 lb 15.1 oz (66.2 kg) Height:  5\' 2"  (157.5 cm)  BEHAVIORAL SYMPTOMS/MOOD NEUROLOGICAL BOWEL NUTRITION STATUS      Continent Diet  AMBULATORY STATUS COMMUNICATION OF NEEDS Skin   Supervision Verbally Normal                       Personal Care Assistance Level of Assistance  Bathing,Feeding,Dressing Bathing Assistance: Limited  assistance Feeding assistance: Limited assistance Dressing Assistance: Limited assistance     Functional Limitations Info             SPECIAL CARE FACTORS FREQUENCY  PT (By licensed PT),Speech therapy,OT (By licensed OT)     PT Frequency: 5x a week OT Frequency: 5x a week     Speech Therapy Frequency: 5x a week      Contractures      Additional Factors Info                  Current Medications (09/07/2020):  This is the current hospital active medication list Current Facility-Administered Medications  Medication Dose Route Frequency Provider Last Rate Last Admin  . acetaminophen (TYLENOL) tablet 325-650 mg  325-650 mg Oral Q4H PRN Love, Pamela S, PA-C      . alum & mag hydroxide-simeth (MAALOX/MYLANTA) 200-200-20 MG/5ML suspension 30 mL  30 mL Oral Q4H PRN Love, Pamela S, PA-C      . amLODipine (NORVASC) tablet 10 mg  10 mg Oral Daily Bary Leriche, PA-C   10 mg at 09/07/20 0858  . apixaban (ELIQUIS) tablet 5 mg  5 mg Oral BID Bary Leriche, PA-C   5 mg at 09/07/20 0859  . atorvastatin (LIPITOR) tablet 20 mg  20 mg Oral Daily Bary Leriche, PA-C   20 mg at 09/07/20 0859  . bisacodyl (DULCOLAX) suppository 10 mg  10 mg Rectal Daily PRN Love, Pamela S, PA-C      . diphenhydrAMINE (BENADRYL) 12.5 MG/5ML elixir 12.5-25 mg  12.5-25 mg Oral Q6H PRN Love, Pamela S, PA-C      . docusate sodium (COLACE) capsule 100 mg  100 mg Oral BID PRN Bary Leriche, PA-C   100 mg at 09/06/20 1754  . feeding supplement (ENSURE ENLIVE / ENSURE PLUS) liquid 237 mL  237 mL Oral Q24H Bary Leriche, PA-C   237 mL at 09/06/20 1759  . guaiFENesin-dextromethorphan (ROBITUSSIN DM) 100-10 MG/5ML syrup 5-10 mL  5-10 mL Oral Q6H PRN Love, Pamela S, PA-C      . polyethylene glycol (MIRALAX / GLYCOLAX) packet 17 g  17 g Oral Daily PRN Bary Leriche, PA-C   17 g at 09/06/20 1754  . prochlorperazine (COMPAZINE) tablet 5-10 mg  5-10 mg Oral Q6H PRN Love, Pamela S, PA-C       Or  . prochlorperazine  (COMPAZINE) injection 5-10 mg  5-10 mg Intramuscular Q6H PRN Love, Pamela S, PA-C       Or  . prochlorperazine (COMPAZINE) suppository 12.5 mg  12.5 mg Rectal Q6H PRN Love, Pamela S, PA-C      . sodium phosphate (FLEET) 7-19 GM/118ML enema 1 enema  1 enema Rectal Once PRN Love, Pamela S, PA-C      . ticagrelor (BRILINTA) tablet 90 mg  90 mg Oral BID Izora Ribas, MD   90 mg at 09/07/20 0859  . traZODone (DESYREL) tablet 25-50 mg  25-50 mg Oral QHS PRN Bary Leriche, PA-C   50 mg at 09/06/20 2040     Discharge Medications: Please see discharge summary for a list of discharge medications.  Relevant Imaging Results:  Relevant Lab Results:   Additional Information    Dyanne Iha

## 2020-09-07 NOTE — Progress Notes (Signed)
Speech Language Pathology Daily Session Note  Patient Details  Name: Deanna Williams MRN: [id number] Date of Birth: 1928-07-16  Today's Date: 09/07/2020 SLP Individual Time: 1045-1130 SLP Individual Time Calculation (min): 45 min  Short Term Goals: Week 1: SLP Short Term Goal 1 (Week 1): STG=LTG due to ELOS  Skilled Therapeutic Interventions:Skilled ST services focused on cognitive skills. SLP facilitated medication management with BID pill organizer, pt demonstrated mod I for problem solving and self-corrected x1 error made. SLP also facilitated complex problem solving skills in account balancing task, pt required supervision A verbal cues for error awareness. SLP recommends finishing task next session. Pt was left in room with call bell within reach and chair alarm set. SLP recommends to continue skilled services.     Pain Pain Assessment Pain Score: 0-No pain  Therapy/Group: Individual Therapy  [name]  [facility] 09/07/2020, 12:36 PM

## 2020-09-07 NOTE — Progress Notes (Signed)
Nutrition Education Note  RD consulted for nutrition education regarding diabetes and heart healthy diet.   Lab Results  Component Value Date   HGBA1C 5.9 (H) 09/02/2020    RD provided "Heart Healthy Consistent Carbohydrate Nutrition Therapy" handout from the Academy of Nutrition and Dietetics. Provided list of carbohydrates and recommended serving sizes of common foods. Discussed importance of controlled and consistent carbohydrate intake throughout the day.  Patient was working with SLP, so RD unable to provide in-depth education.  Patient reports that she has been eating well and weight has been stable.   Body mass index is 26.69 kg/m. Pt meets criteria for overweight based on current BMI.  Current diet order is heart healthy CHO modified, patient is consuming approximately 80-100% of meals at this time. Labs and medications reviewed. No further nutrition interventions warranted at this time. RD contact information provided. If additional nutrition issues arise, please re-consult RD.  Lucas Mallow, RD, LDN, CNSC Please refer to [facility] for contact information.

## 2020-09-07 NOTE — Progress Notes (Signed)
Occupational Therapy Session Note  Patient Details  Name: Deanna Williams MRN: [id number] Date of Birth: [birth year]  Today's Date: 09/07/2020 OT Individual Time: 0930-1030 OT Individual Time Calculation (min): 60 min    Short Term Goals: Week 1:  OT Short Term Goal 1 (Week 1): STG= LTG d/t ELOS  Skilled Therapeutic Interventions/Progress Updates:    Treatment session with focus on self-care retraining, functional mobility and transfers, and dynamic standing balance.  Pt received upright in recliner agreeable to therapy session.  Pt ambulated to bathroom with RW with close supervision.  Pt reports need to toilet prior to shower.  Pt completed clothing management and hygiene with CGA at sit > stand level.  Pt transferred in to shower with CGA when stepping backwards over threshold.  Pt completed majority of shower seated with distant supervision, CGA when standing to wash buttocks/perineal area.  Pt ambulated back to recliner to complete dressing with supervision/setup.  Pt ambulated to sink and reports feeling "woozy", therapist encouraged pt to sit.  Vitals obtained BP 140/76 and HR 79.  Pt reports symptoms subsiding.  Pt stood at sink to blow dry hair and brush teeth with CGA to close supervision when standing.  Pt returned to recliner and left upright with chair alarm on and all needs in reach.  Therapy Documentation Precautions:  Precautions Precautions: Fall Restrictions Weight Bearing Restrictions: No General:   Vital Signs: Therapy Vitals Temp: 98.6 F (37 C) Temp Source: Oral Pulse Rate: 76 Resp: 17 BP: 128/65 Patient Position (if appropriate): Sitting Oxygen Therapy SpO2: 97 % O2 Device: Room Air Pain: Pain Assessment Pain Score: 0-No pain   Therapy/Group: Individual Therapy  Simonne Come 09/07/2020, 3:37 PM

## 2020-09-07 NOTE — Progress Notes (Signed)
Physical Therapy Session Note  Patient Details  Name: Deanna Williams MRN: [id number] Date of Birth: 1928/11/10  Today's Date: 09/07/2020 PT Individual Time: 7124-5809 PT Individual Time Calculation (min): 72 min   Short Term Goals: Week 1:  PT Short Term Goal 1 (Week 1): =LTG due to ELOS  Skilled Therapeutic Interventions/Progress Updates:   Received pt sitting in recliner, pt agreeable to therapy, and denied any pain during session. Session with focus on functional mobility/transfers, toileting,  generalized strengthening, dynamic standing balance/coordination, gait training, NMR, and improved activity tolerance. Pt reported urge to use restroom and ambulated 32ft with RW and supervision to bathroom. Pt able to manage clothing, void, and perform peri-care with close supervision. Pt stood at sink and washed hands with supervision. Pt transported to 4W dayroom in [facility] total A for time management purposes. Pt ambulated 141ft x 1 without AD and CGA/min A and 112ft x 1 with RW and close supervision. Without AD pt demonstrated narrow BOS, flexed trunk, and decreased bilateral foot clearance requiring cues to increase step height and length. When using RW pt demonstrated improved bilateral foot clearance and step length. Discussion had regarding using RW at discharge for safety purposes and to maximize independence and pt in agreement. Worked on dynamic standing balance playing cornhole without AD and CGA for balance x 3 trials. Worked on blocked practice sit<>stands without UE support 2x5 reps with close supervision for balance. Stand<>pivot mat<>WC with RW and supervision and transported back to room in [facility] total A. Pt reported urge to urinate again (see above) and reported fatigue at end of session and requested to return to bed. Stand<>pivot WC<>bed with RW and supervision and able to kick off shoes Mod I. Sit<>supine with supervision. Concluded session with pt supine in bed, needs within reach, and bed  alarm on.   Therapy Documentation Precautions:  Precautions Precautions: Fall Restrictions Weight Bearing Restrictions: No  Therapy/Group: Individual Therapy Alfonse Alpers PT, DPT   09/07/2020, 7:35 AM

## 2020-09-08 ENCOUNTER — Other Ambulatory Visit (HOSPITAL_COMMUNITY): Payer: Self-pay

## 2020-09-08 DIAGNOSIS — I63532 Cerebral infarction due to unspecified occlusion or stenosis of left posterior cerebral artery: Secondary | ICD-10-CM | POA: Diagnosis not present

## 2020-09-08 MED ORDER — DOCUSATE SODIUM 100 MG PO CAPS
100.0000 mg | ORAL_CAPSULE | Freq: Three times a day (TID) | ORAL | Status: DC
  Administered 2020-09-09 – 2020-09-11 (×7): 100 mg via ORAL
  Filled 2020-09-08 (×9): qty 1

## 2020-09-08 NOTE — IPOC Note (Signed)
Overall Plan of Care [facility]) Patient Details Name: Deanna Williams MRN: [id number] DOB: 1928/09/22  Admitting Diagnosis: Acute ischemic left PCA stroke [facility])  Hospital Problems: Principal Problem:   Acute ischemic left PCA stroke ([place])     Functional Problem List: Nursing Endurance,Safety  PT Balance,Endurance,Motor,Safety,Sensory  OT Balance,Cognition,Endurance,Motor,Safety  SLP Cognition  TR         Basic ADL's: OT Bathing,Dressing,Toileting     Advanced  ADL's: OT Simple Meal Preparation,Laundry,Light Housekeeping     Transfers: PT Bed Mobility,Bed to [place]  OT Toilet,Tub/Shower     Locomotion: PT Ambulation,Wheelchair Mobility,Stairs     Additional Impairments: OT None  SLP Social Cognition   Memory,Problem Solving  TR      Anticipated Outcomes Item Anticipated Outcome  Self Feeding mod I  Swallowing      Basic self-care  mod I  Toileting  mod I   Bathroom Transfers mod I  Bowel/Bladder  Mod I  Transfers  mod I  Locomotion  mod I with LRAD  Communication     Cognition  mod I  Pain     Safety/Judgment  Mod I and no falls   Therapy Plan: PT Intensity: Minimum of 1-2 x/day ,45 to 90 minutes PT Frequency: 5 out of 7 days PT Duration Estimated Length of Stay: 7-10 days OT Intensity: Minimum of 1-2 x/day, 45 to 90 minutes OT Frequency: 5 out of 7 days OT Duration/Estimated Length of Stay: 7-10 days SLP Intensity: Minumum of 1-2 x/day, 30 to 90 minutes SLP Frequency: 1 to 3 out of 7 days SLP Duration/Estimated Length of Stay: 7-10 days   Due to the current state of emergency, patients may not be receiving their 3-hours of Medicare-mandated therapy.   Team Interventions: Nursing Interventions Patient/Family Education,Disease Management/Prevention,Psychosocial Support,Medication Management  PT interventions Ambulation/gait training,Balance/vestibular training,Community reintegration,Discharge planning,Disease  management/prevention,DME/adaptive equipment instruction,Functional mobility training,Neuromuscular re-education,Patient/family education,Stair training,Therapeutic Activities,Therapeutic Exercise,UE/LE Strength taining/ROM,UE/LE Coordination activities,Wheelchair propulsion/positioning  OT Interventions Balance/vestibular training,DME/adaptive equipment instruction,Patient/family education,Therapeutic Activities,Therapeutic Exercise,Cognitive remediation/compensation,Psychosocial support,Community reintegration,Functional mobility training,Self Care/advanced ADL retraining,UE/LE Strength taining/ROM,UE/LE Coordination activities,Discharge planning,Disease mangement/prevention,Pain management,Visual/perceptual remediation/compensation,Neuromuscular re-education  SLP Interventions Cognitive remediation/compensation,Cueing hierarchy,Environmental controls,Patient/family education,Internal/external aids  TR Interventions    SW/CM Interventions Discharge Planning,Psychosocial Support,Patient/Family Education,Disease Management/Prevention   Barriers to Discharge MD  Medical stability  Nursing Decreased caregiver support,Home environment access/layout,Medication compliance    PT      OT      SLP      SW       Team Discharge Planning: Destination: PT-Home ,OT- Home , SLP-Home Projected Follow-up: PT-Home health PT, OT-  None, SLP-None Projected Equipment Needs: PT- , OT- None recommended by OT, SLP-None recommended by SLP Equipment Details: PT-TBD pending progress, OT-  Patient/family involved in discharge planning: PT- Patient,  OT-Patient, SLP-Patient  MD ELOS: 7-10 days Medical Rehab Prognosis:  Excellent Assessment: Deanna Williams is an 87 year old woman who is admitted to CIR with functional deficits and mild right hemiparesissecondary to left PCA infarct. Active medical issues include constipation, hyponatremia, and newly diagnosed atrial fibrillation. Labs and vitals are being monitored  regularly and medications are being adjusted.    See Team Conference Notes for weekly updates to the plan of care

## 2020-09-08 NOTE — Progress Notes (Signed)
Patient ID: Deanna Williams, female   DOB: 20-Jul-1928, 87 y.o.   MRN: [id number] Team Conference Report to Patient/Family  Team Conference discussion was reviewed with the patient and caregiver, including goals, any changes in plan of care and target discharge date.  Patient and caregiver express understanding and are in agreement.  The patient has a target discharge date of 09/11/20.  SW spoke with pt and pt dtr in law. Happy about pt progressing. Pt happy is is able to d/c back to her ILF and meeting goals. Therapy to address money and time management.   Dyanne Iha 09/08/2020, 1:34 PM

## 2020-09-08 NOTE — Progress Notes (Signed)
PROGRESS NOTE   Subjective/Complaints: Doing great! Still constipated: discussed with nursing during team conference miralax in warmed prune juice and colace TID  ROS: Patient denies pain, fever, rash, sore throat, blurred vision, nausea, vomiting, diarrhea, cough, shortness of breath or chest pain, joint or back pain, headache, or mood change. +constipation   Objective:   No results found. Recent Labs    09/07/20 0612  WBC 5.8  HGB 12.2  HCT 36.8  PLT 246   Recent Labs    09/07/20 0612  NA 134*  K 4.0  CL 103  CO2 26  GLUCOSE 109*  BUN 19  CREATININE 0.89  CALCIUM 8.9    Intake/Output Summary (Last 24 hours) at 09/08/2020 0947 Last data filed at 09/08/2020 0830 Gross per 24 hour  Intake 440 ml  Output --  Net 440 ml        Physical Exam: Vital Signs Blood pressure 133/70, pulse 68, temperature 98 F (36.7 C), temperature source Oral, resp. rate 17, height 5\' 2"  (1.575 m), weight 66.2 kg, SpO2 96 %. Gen: no distress, normal appearing HEENT: oral mucosa pink and moist, NCAT Cardio: Reg rate Chest: normal effort, normal rate of breathing Abd: soft, non-distended Ext: no edema Psych: pleasant, normal affect Skin: intact Neuro: Alert and oriented x 3. Normal insight and awareness. Intact Memory. Normal language and speech. Cranial nerve exam unremarkable. RUE 4/5 prox to distal. RLE 3-4/5 prox to distal. LUE 4+/5. LLE 3+ to 4/5. Stocking glove sensory loss from knees to feet and bilateral hadns. DTR's 1+. No focal limb ataxia. No abnormal tone Musculoskeletal: Full ROM, No pain with AROM or PROM in the neck, trunk, or extremities. Posture appropriate    Assessment/Plan: 1. Functional deficits which require 3+ hours per day of interdisciplinary therapy in a comprehensive inpatient rehab setting.  Physiatrist is providing close team supervision and 24 hour management of active medical problems listed  below.  Physiatrist and rehab team continue to assess barriers to discharge/monitor patient progress toward functional and medical goals  Care Tool:  Bathing    Body parts bathed by patient: Right arm,Left arm,Chest,Abdomen,Front perineal area,Right upper leg,Buttocks,Left upper leg,Right lower leg,Left lower leg,Face         Bathing assist Assist Level: Contact Guard/Touching assist     Upper Body Dressing/Undressing Upper body dressing   What is the patient wearing?: Pull over shirt    Upper body assist Assist Level: Supervision/Verbal cueing    Lower Body Dressing/Undressing Lower body dressing      What is the patient wearing?: Underwear/pull up,Pants     Lower body assist Assist for lower body dressing: Supervision/Verbal cueing     Toileting Toileting    Toileting assist Assist for toileting: Supervision/Verbal cueing     Transfers Chair/bed transfer  Transfers assist     Chair/bed transfer assist level: Contact Guard/Touching assist     Locomotion Ambulation   Ambulation assist      Assist level: Supervision/Verbal cueing Assistive device: Walker-rolling Max distance: 163ft   Walk 10 feet activity   Assist     Assist level: Supervision/Verbal cueing Assistive device: Walker-rolling   Walk 50 feet activity   Assist  Assist level: Supervision/Verbal cueing Assistive device: Walker-rolling    Walk 150 feet activity   Assist    Assist level: Supervision/Verbal cueing Assistive device: Walker-rolling    Walk 10 feet on uneven surface  activity   Assist Walk 10 feet on uneven surfaces activity did not occur: Safety/medical concerns         Wheelchair     Assist Will patient use wheelchair at discharge?: No             Wheelchair 50 feet with 2 turns activity    Assist            Wheelchair 150 feet activity     Assist          Blood pressure 133/70, pulse 68, temperature 98 F (36.7  C), temperature source Oral, resp. rate 17, height 5\' 2"  (1.575 m), weight 66.2 kg, SpO2 96 %.  Medical Problem List and Plan: 1.Functional deficits and mild right hemiparesissecondary to left PCA infarct -patient may shower -ELOS/Goals: 7-10 days, supervision to mod I with PT and OT  -Continue CIR therapies today including PT and OT -antiplatelet therapy: ASA/Brilinta 3. Pain Management:N/A 4. Mood:LCSW to follow for evaluation and support. -antipsychotic agents: N/A 5. Neuropsych: This patientiscapable of making decisions on herown behalf. 6. Skin/Wound Care:Routine pressure relief measures 7. Fluids/Electrolytes/Nutrition:Monitor I/O. Check lytes Monday. 8. HTN: better controlled, continue Norvasc daily 9. A fib: New diagnosis--now on Eliquis. -HR controlled 4/26, continue to monitor TID 10. Prediabetes: Hgb A1C-5.9. Add CM restrictions and educate patient on CM diet.  11. L-PCA stent: On ASA, Brilinta and Lipitor. 12. Hyponatremia: monitor Na weekly.  13. Constipation: miralax daily warmed in prune juice. Add colace TID with meals.     LOS: 3 days A FACE TO FACE EVALUATION WAS PERFORMED  Deanna Williams 09/08/2020, 9:47 AM

## 2020-09-08 NOTE — Progress Notes (Signed)
Occupational Therapy Session Note  Patient Details  Name: Deanna Williams MRN: [id number] Date of Birth: [birth year]  Today's Date: 09/08/2020 OT Individual Time: 1572-6203 OT Individual Time Calculation (min): 53 min    Short Term Goals: Week 1:  OT Short Term Goal 1 (Week 1): STG= LTG d/t ELOS  Skilled Therapeutic Interventions/Progress Updates:    Pt received semi-reclined in bed, denies pain but c/o R hand numbness, agreeable to therapy. Came to sitting EOB distant S + use of bed features. Doffed B socks close S. STS and amb to toilet CGA + RW, distant S for LB clothing management and seated pericare. Cont void of bladder. TTB transfers with close S after demonstration, TTB adjust to come over shower lip. Overall, pt bathed full-body distant S, CGA for STS to bathe periarea. Donned shirt, underwear, and pants close S. Amb to sink and complete oral and hair care close S. Amb back to recliner.   Pt left seated in recliner with chair alarm engaged, call bell in reach, and all immediate needs met.    Therapy Documentation Precautions:  Precautions Precautions: Fall Restrictions Weight Bearing Restrictions: No Pain: Pain Assessment Pain Scale: Faces Pain Score: 0-No pain ADL: See Care Tool for more details.  Therapy/Group: Individual Therapy  Volanda Napoleon MS, OTR/L  09/08/2020, 12:44 PM

## 2020-09-08 NOTE — Progress Notes (Signed)
Physical Therapy Session Note  Patient Details  Name: Deanna Williams MRN: [id number] Date of Birth: 07-Mar-1929  Today's Date: 09/08/2020 PT Individual Time: 1000-1055 PT Individual Time Calculation (min): 55 min   Short Term Goals: Week 1:  PT Short Term Goal 1 (Week 1): =LTG due to ELOS  Skilled Therapeutic Interventions/Progress Updates:   Received pt sitting in recliner with granddaughter present at bedside, pt agreeable to therapy, and denied any pain during session. Session with emphasis on functional mobility/transfers, toileting, generalized strengthening, dynamic standing balance/coordination, ambulation, and improved activity tolerance. Pt ambulated 66ft with RW and supervision to [facility] then reported feeling "woozy" and nauseous. BP taken sitting: 146/66. Pt reported relief of "wooziness" after ~3 minute seated rest break but felt nauseous throughout entire session. Pt transported to therapy gym in [facility] total A for time management purposes and performed BUE strengthening on UBE at level 1.5 for 3 minutes forward and 3 minutes backwards. Pt then ambulated 66ft with RW and supervision/CGA. Pt ambulated at a slower cadence and required CGA for balance when turning due to increased reports of "wooziness". Worked on dynamic standing balance tossing horseshoes with LUE and supervision using RW x 2 trials; pt denied any "wooziness" during activity. Pt transported back to room in [facility] total A and reported urge to urinate. Pt ambulated 99ft with RW and supervision to bathroom and able to manage clothing, void, and perform peri-care with supervision. Pt stood at sink and washed hands with supervision and requested to return to bed. Sit<>supine with supervision. Concluded session with pt supine in bed, needs within reach, and bed alarm on.   Therapy Documentation Precautions:  Precautions Precautions: Fall Restrictions Weight Bearing Restrictions: No  Therapy/Group: Individual Therapy Alfonse Alpers PT, DPT   09/08/2020, 7:25 AM

## 2020-09-08 NOTE — Progress Notes (Signed)
Occupational Therapy Session Note  Patient Details  Name: Deanna Williams MRN: [id number] Date of Birth: 1928-11-27  Today's Date: 09/08/2020 OT Individual Time: 6759-1638 OT Individual Time Calculation (min): 54 min    Short Term Goals: Week 1:  OT Short Term Goal 1 (Week 1): STG= LTG d/t ELOS  Skilled Therapeutic Interventions/Progress Updates:    Treatment session with focus on functional transfers, dynamic standing, and activity tolerance.  Pt received in sidelying in bed reporting fatigue but agreeable to therapy session.  Pt completed bed mobility with supervision and completed stand pivot transfer to recliner with RW and supervision.  Engaged in dynamic standing balance while engaging in pipe tree puzzle to challenge endurance, sequencing, and problem solving.  Pt required mod cues for problem solving, encouraging pt rely on printed pattern to double check and correct errors.  Pt required seated rest breaks throughout due to reports of feeling "nauseated".  BP 128/65 and HR 73.  Pt reports symptoms subsiding with rest and water.  Pt reports fatigue and requested to return to supine.  Pt completed stand pivot transfer back to bed supervision and left in sidelying with all needs in reach.  Therapy Documentation Precautions:  Precautions Precautions: Fall Restrictions Weight Bearing Restrictions: No General:   Vital Signs: Therapy Vitals Temp: 98 F (36.7 C) Temp Source: Oral Pulse Rate: 76 Resp: 18 BP: (!) 145/63 Patient Position (if appropriate): Lying Oxygen Therapy SpO2: 97 % O2 Device: Room Air Pain: Pain Assessment Pain Scale: Faces Pain Score: 0-No pain   Therapy/Group: Individual Therapy  Simonne Come 09/08/2020, 3:48 PM

## 2020-09-08 NOTE — Progress Notes (Signed)
Patient ID: Deanna Williams, female   DOB: 1928/12/16, 87 y.o.   MRN: [id number]   Rolling Walker ordered through adapt.   [place], [place]

## 2020-09-08 NOTE — Patient Care Conference (Signed)
Inpatient RehabilitationTeam Conference and Plan of Care Update Date: 09/08/2020   Time: 9:44 AM    Patient Name: Deanna Williams      Medical Record Number: [id number]  Date of Birth: Oct 10, 1928 Sex: Female         Room/Bed: 15C07C/5C07C-01 Payor Info: Payor: [employer] / Plan: [employer] Must PPO / Product Type: *No Product type* /    Admit Date/Time:  09/05/2020  2:55 PM  Primary Diagnosis:  Acute ischemic left PCA stroke [facility])  Hospital Problems: Principal Problem:   Acute ischemic left PCA stroke [facility])    Expected Discharge Date: Expected Discharge Date: 09/11/20  Team Members Present: Physician leading conference: Dr. Leeroy Cha Care Coodinator Present: Erlene Quan, BSW;Parry Po Creig Hines, RN, BSN, [place] Nurse Present: Dorthula Nettles, RN PT Present: Becky Sax, PT OT Present: Simonne Come, OT SLP Present: Charolett Bumpers, SLP PPS Coordinator present : Ileana Ladd, PT     Current Status/Progress Goal Weekly Team Focus  Bowel/Bladder             Swallow/Nutrition/ Hydration             ADL's   CGA - Supervision ambulatory transfers with RW, Supervision bathing and dressing, mild STM impairments  Mod I overall  ADL retraining, dynamic standing balance, endurance, awareness   Mobility   transfers with RW CGA, gait 14ft with RW CGA  mod I  functional mobility/transfers, generalized strengthening, dynamic standing balance/coordination, ambulation, and endurance   Communication             Safety/Cognition/ Behavioral Observations  Supervision A  Mod I  complex problem solving and recall   Pain             Skin               Discharge Planning:  Pt to d/c back to ILF. Granddaughter able to stay with patient, if needed. Family able to hire assistance   Team Discussion: VS stable, new diagnosis of A-fib, right hand and foot numbness. Continent B/B, however no bowel movement in 5 days, MD request Miralax be given in warmed prune juice.  No reported pain, sleep, or skin issues. To discharge back to facility and granddaughter to stay with her. She can receive therapy at the facility.  Patient on target to meet rehab goals: yes, contact guard to close supervision with RW, mod I goals. Recommending RW and outpatient PT. Woozy in today's session, took a bath in the shower with supervision. Mod I goals. SLP is supervision with money and time management. She has mod I goals.  *See Care Plan and progress notes for long and short-term goals.   Revisions to Treatment Plan:  Working on bowel medications.  Teaching Needs: Family education, medication management, pain management, skin/wound care, transfer training, gait training, balance training, endurance training, safety awareness.  Current Barriers to Discharge: Decreased caregiver support, Medical stability, Home enviroment access/layout, Wound care, Lack of/limited family support, Medication compliance and Behavior  Possible Resolutions to Barriers: Continue current medications, constipation resolution and management, provide emotional support.     Medical Summary Current Status: no pain, constipation, right hand and foot numbness, woozy  Barriers to Discharge: Medical stability  Barriers to Discharge Comments: constipation, right hand and foot numbness, woozy Possible Resolutions to [employer] Focus: daily miralax in warmed prune juice, add colace TID with meals, discussed etiology and prognosis of numbness, conitnue to monitor HR TID   Continued Need for Acute Rehabilitation Level of Care: The patient  requires daily medical management by a physician with specialized training in physical medicine and rehabilitation for the following reasons: Direction of a multidisciplinary physical rehabilitation program to maximize functional independence : Yes Medical management of patient stability for increased activity during participation in an intensive rehabilitation regime.:  Yes Analysis of laboratory values and/or radiology reports with any subsequent need for medication adjustment and/or medical intervention. : Yes   I attest that I was present, lead the team conference, and concur with the assessment and plan of the team.   Cristi Loron 09/08/2020, 2:16 PM

## 2020-09-09 MED ORDER — ACETAMINOPHEN 325 MG PO TABS: 325.0000 mg | ORAL_TABLET | ORAL | Status: DC | PRN

## 2020-09-09 NOTE — Progress Notes (Signed)
Physical Therapy Session Note  Patient Details  Name: Deanna Williams MRN: [id number] Date of Birth: 1928-09-18  Today's Date: 09/09/2020 PT Individual Time: 1308-6578 PT Individual Time Calculation (min): 47 min   Short Term Goals: Week 1:  PT Short Term Goal 1 (Week 1): =LTG due to ELOS  Skilled Therapeutic Interventions/Progress Updates:  Patient seated in recliner on entrance to room. Patient alert and agreeable to PT session. Patient with no c/o pain during session.  Therapeutic Activity: Bed Mobility: Patient performed sit--> supine with supervision. VC/ tc required for positioning. Transfers: Patient performed STS and SPVT transfers with supervision from seat to RW and to no AD. Provided verbal cues for foot positioning and forward lean.   Gait Training:  Patient ambulated >200' x1 leaving room using RW with supervision and able to enter elevator and exit on 4th floor then requiring sit to w/c for therapeutic rest. Noted R elbow flexion with slight lean to R side. Ambulates 125' with no AD and supervision with improved upright posture. Pt states that she is comfortable without use of walker but not quite as stable without hand support. Provided vc/ tc for level gaze.  Encouraged pt to demonstrated stair negotiation in order to assess ability to enter/ exit facility activities vehicle. Pt completes step negotiation with ability to relate correct sequencing prior to attempt and then return demonstration completing four 6" steps using BHR with close supervision.  Pt also completes ambulation up/ down ramp and over uneven surfaces with use of RW and supervision with intermittent CGA during amb in mulch pit. Pt demonstrates safe RW mgmt and step negotiation in transition of surfaces. Good control also noted in push up ramp and with decrease in speed down ramp.   Patient supine in bed at end of session with brakes locked, bed alarm set, and all needs within reach.   Therapy  Documentation Precautions:  Precautions Precautions: Fall Restrictions Weight Bearing Restrictions: No  Therapy/Group: Individual Therapy  Alger Simons PT, DPT 09/09/2020, 6:30 PM

## 2020-09-09 NOTE — Progress Notes (Signed)
PROGRESS NOTE   Subjective/Complaints:  Had a good BM- feels much better. Still has numbness in tongue and R hand, but feels better and medically improving.    ROS:  Pt denies SOB, abd pain, CP, N/V/C/D, and vision changes  Objective:   No results found. Recent Labs    09/07/20 0612  WBC 5.8  HGB 12.2  HCT 36.8  PLT 246   Recent Labs    09/07/20 0612  NA 134*  K 4.0  CL 103  CO2 26  GLUCOSE 109*  BUN 19  CREATININE 0.89  CALCIUM 8.9    Intake/Output Summary (Last 24 hours) at 09/09/2020 2023 Last data filed at 09/09/2020 1843 Gross per 24 hour  Intake 831 ml  Output --  Net 831 ml        Physical Exam: Vital Signs Blood pressure 122/70, pulse 72, temperature 97.6 F (36.4 C), temperature source Oral, resp. rate 14, height 5\' 2"  (1.575 m), weight 66.2 kg, SpO2 94 %.     General: awake, alert, appropriate, sitting up in bedside chair, NAD HENT: conjugate gaze; oropharynx moist CV: regular rate; no JVD Pulmonary: CTA B/L; no W/R/R- good air movement GI: soft, NT, ND, (+)BS Psychiatric: appropriate Neurological: Ox3  Skin: intact Neuro: Alert and oriented x 3. Normal insight and awareness. Intact Memory. Normal language and speech. Cranial nerve exam unremarkable. RUE 4/5 prox to distal. RLE 3-4/5 prox to distal. LUE 4+/5. LLE 3+ to 4/5. Stocking glove sensory loss from knees to feet and bilateral hadns. DTR's 1+. No focal limb ataxia. No abnormal tone Musculoskeletal: Full ROM, No pain with AROM or PROM in the neck, trunk, or extremities. Posture appropriate    Assessment/Plan: 1. Functional deficits which require 3+ hours per day of interdisciplinary therapy in a comprehensive inpatient rehab setting.  Physiatrist is providing close team supervision and 24 hour management of active medical problems listed below.  Physiatrist and rehab team continue to assess barriers to discharge/monitor  patient progress toward functional and medical goals  Care Tool:  Bathing    Body parts bathed by patient: Right arm,Left arm,Chest,Abdomen,Front perineal area,Right upper leg,Buttocks,Left upper leg,Right lower leg,Left lower leg,Face         Bathing assist Assist Level: Independent with assistive device     Upper Body Dressing/Undressing Upper body dressing   What is the patient wearing?: Pull over shirt    Upper body assist Assist Level: Independent with assistive device    Lower Body Dressing/Undressing Lower body dressing      What is the patient wearing?: Underwear/pull up,Pants     Lower body assist Assist for lower body dressing: Independent with assitive device     Toileting Toileting    Toileting assist Assist for toileting: Supervision/Verbal cueing     Transfers Chair/bed transfer  Transfers assist     Chair/bed transfer assist level: Supervision/Verbal cueing     Locomotion Ambulation   Ambulation assist      Assist level: Supervision/Verbal cueing Assistive device: Walker-rolling Max distance: 110ft   Walk 10 feet activity   Assist     Assist level: Supervision/Verbal cueing Assistive device: Walker-rolling   Walk 50 feet activity  Assist    Assist level: Supervision/Verbal cueing Assistive device: Walker-rolling    Walk 150 feet activity   Assist    Assist level: Supervision/Verbal cueing Assistive device: Walker-rolling    Walk 10 feet on uneven surface  activity   Assist Walk 10 feet on uneven surfaces activity did not occur: Safety/medical concerns         Wheelchair     Assist Will patient use wheelchair at discharge?: No             Wheelchair 50 feet with 2 turns activity    Assist            Wheelchair 150 feet activity     Assist          Blood pressure 122/70, pulse 72, temperature 97.6 F (36.4 C), temperature source Oral, resp. rate 14, height 5\' 2"  (1.575 m),  weight 66.2 kg, SpO2 94 %.  Medical Problem List and Plan: 1.Functional deficits and mild right hemiparesissecondary to left PCA infarct -patient may shower -ELOS/Goals: 7-10 days, supervision to mod I with PT and OT  -con't PT and OT -antiplatelet therapy: ASA/Brilinta 3. Pain Management:N/A 4. Mood:LCSW to follow for evaluation and support. -antipsychotic agents: N/A 5. Neuropsych: This patientiscapable of making decisions on herown behalf. 6. Skin/Wound Care:Routine pressure relief measures 7. Fluids/Electrolytes/Nutrition:Monitor I/O. Check lytes Monday. 8. HTN: better controlled, continue Norvasc daily 9. A fib: New diagnosis--now on Eliquis. -HR controlled 4/26, continue to monitor TID  4/27- sounded RRR today- con't regimen 10. Prediabetes: Hgb A1C-5.9. Add CM restrictions and educate patient on CM diet.  11. L-PCA stent: On ASA, Brilinta and Lipitor. 12. Hyponatremia: monitor Na weekly.  13. Constipation: miralax daily warmed in prune juice. Add colace TID with meals.   4/27- constipation resolved for now- had BM.     LOS: 4 days A FACE TO FACE EVALUATION WAS PERFORMED  Deanna Williams 09/09/2020, 8:23 PM

## 2020-09-09 NOTE — Progress Notes (Signed)
Speech Language Pathology Daily Session Note  Patient Details  Name: MENDE BISWELL MRN: [id number] Date of Birth: 11-08-1928  Today's Date: 09/09/2020 SLP Individual Time: 1350-1430 SLP Individual Time Calculation (min): 40 min  Short Term Goals: Week 1: SLP Short Term Goal 1 (Week 1): STG=LTG due to ELOS  Skilled Therapeutic Interventions:   Patient seen for skilled ST session focusing on cognitive function goals. Patient transferred from bed to recliner using RW with supervision assistance. Patient reported that she felt completing the [place] register task previous date took her longer than it should and so SLP presented another check book register task. Patient entered in names and amounts, calculated balance for 4 different transactions, doing math by hand and was 100% accurate. Patient is likely at very near baseline cognitive functioning and is on track for discharge.   Pain Pain Assessment Pain Scale: 0-10 Pain Score: 0-No pain  Therapy/Group: Individual Therapy  Sonia Baller, MA, CCC-SLP Speech Therapy

## 2020-09-09 NOTE — Progress Notes (Signed)
Occupational Therapy Discharge Summary  Patient Details  Name: Deanna Williams MRN: [id number] Date of Birth: 1928/05/17   Patient has met 9 of 9 long term goals due to improved activity tolerance, improved balance, postural control, ability to compensate for deficits and improved awareness.  Patient to discharge at overall Modified Independent level.  Patient's care partner is independent to provide the necessary intermittent assistance at discharge.  Patient reports that her granddaughter will be staying with her for a few weeks initially upon d/c to provide intermittent supervision and transportation.  Reasons goals not met: N/A  Recommendation:  Patient will benefit from ongoing skilled OT services in home health setting to continue to advance functional skills in the area of BADL and Reduce care partner burden.  Equipment: No equipment provided  Reasons for discharge: treatment goals met and discharge from hospital  Patient/family agrees with progress made and goals achieved: Yes  OT Discharge Precautions/Restrictions   falls, R hemi General   Vital Signs Therapy Vitals Temp: 97.9 F (36.6 C) Temp Source: Oral Pulse Rate: 71 Resp: 17 BP: 120/63 Patient Position (if appropriate): Lying Oxygen Therapy O2 Device: Room Air Pain Pain Assessment Pain Scale: 0-10 Pain Score: 0-No pain ADL ADL Eating: Modified independent Where Assessed-Eating: Chair Grooming: Modified independent Where Assessed-Grooming: Standing at sink Upper Body Bathing: Supervision/safety Where Assessed-Upper Body Bathing: Shower Lower Body Bathing: Modified independent Where Assessed-Lower Body Bathing: Shower Upper Body Dressing: Modified independent (Device) Where Assessed-Upper Body Dressing: Chair Lower Body Dressing: Modified independent Where Assessed-Lower Body Dressing: Chair Toileting: Modified independent Where Assessed-Toileting: Glass blower/designer: Midwife Method: Optometrist: Walk in Facilities manager Transfer: Modified independent Social research officer, government Method: Heritage manager: Civil engineer, contracting without back Vision Baseline Vision/History: Wears glasses Wears Glasses: At all times Patient Visual Report: No change from baseline Vision Assessment?: Yes Ocular Range of Motion: Within Functional Limits Alignment/Gaze Preference: Within Defined Limits Visual Fields: No apparent deficits Perception  Perception: Within Functional Limits Praxis Praxis: Intact Cognition Overall Cognitive Status: Within Functional Limits for tasks assessed Arousal/Alertness: Awake/alert Orientation Level: Oriented X4 Attention: Selective Selective Attention: Appears intact Memory: Impaired Awareness: Appears intact Problem Solving: Appears intact Safety/Judgment: Appears intact Sensation Sensation Light Touch: Impaired Detail Light Touch Impaired Details: Impaired RLE;Impaired RUE Hot/Cold: Appears Intact Proprioception: Appears Intact Stereognosis: Impaired Detail Stereognosis Impaired Details: Impaired RUE Additional Comments: Numbness in the digits but no issues proximally Coordination Gross Motor Movements are Fluid and Coordinated: No Fine Motor Movements are Fluid and Coordinated: No Coordination and Movement Description: finger to nose slightly slower on the right compared to the left Motor  Motor Motor: Abnormal postural alignment and control;Hemiplegia Motor - Skilled Clinical Observations: mild uncoordination due to R hemi, generalized weakness, and decreaed endurance. Mobility  Bed Mobility Bed Mobility: Rolling Right;Rolling Left;Sit to Supine;Supine to Sit Rolling Right: Independent Rolling Left: Independent Supine to Sit: Independent Sit to Supine: Independent Transfers Sit to Stand: Independent with assistive device Stand to Sit: Independent with assistive  device  Trunk/Postural Assessment  Cervical Assessment Cervical Assessment: Exceptions to [facility] (forward head) Thoracic Assessment Thoracic Assessment: Exceptions to [facility] (kyphosis) Lumbar Assessment Lumbar Assessment: Exceptions to [facility] (posterior pelvic tilt) Postural Control Postural Control: Deficits on evaluation  Balance Balance Balance Assessed: Yes Static Sitting Balance Static Sitting - Balance Support: Feet supported;No upper extremity supported Static Sitting - Level of Assistance: 7: Independent Dynamic Sitting Balance Dynamic Sitting - Balance Support: Feet supported;No upper extremity supported Dynamic Sitting -  Level of Assistance: 6: Modified independent (Device/Increase time) Static Standing Balance Static Standing - Balance Support: Bilateral upper extremity supported (RW) Static Standing - Level of Assistance: 6: Modified independent (Device/Increase time) Dynamic Standing Balance Dynamic Standing - Balance Support: Bilateral upper extremity supported (RW) Dynamic Standing - Level of Assistance: 6: Modified independent (Device/Increase time) Extremity/Trunk Assessment RUE Assessment RUE Assessment: Exceptions to [facility] Passive Range of Motion (PROM) Comments: WFLS for all joints Active Range of Motion (AROM) Comments: grossly WFLs for all joints General Strength Comments: shoulder flexion 3/5, grip 3/5, elbow flexion/extension 3+/5, FM manipulation impaired with in-hand tasks noted with stereognosis testing. LUE Assessment LUE Assessment: Within Functional Limits   MCGUIRE,JAMES 09/10/2020, 8:18 AM

## 2020-09-09 NOTE — Progress Notes (Signed)
Physical Therapy Session Note  Patient Details  Name: Deanna Williams MRN: [id number] Date of Birth: December 05, 1928  Today's Date: 09/09/2020 PT Individual Time: 0915-1010 PT Individual Time Calculation (min): 55 min   Short Term Goals: Week 1:  PT Short Term Goal 1 (Week 1): =LTG due to ELOS  Skilled Therapeutic Interventions/Progress Updates:   Received pt sitting in recliner, pt agreeable to therapy, and denied any pain during session but reported feeling cold; provided sweatshirt. Pt continues to report feeling "woozy" and nauseous today but motivated to participate in session. Session with emphasis on functional mobility/transfers, generalized strengthening, dynamic standing balance/coordination, gait training, stair navigation, and improved activity tolerance. Doffed hospital socks and donned regular socks and shoes with supervision. Reported urge to toilet and ambulated 22ft with RW and supervision to bathroom. Pt able to manage clothing, void, and perform peri-care with supervision. Stood at sink and washed hands with supervision then transported to 4W therapy gym in [facility] total A for time management purposes. Pt navigated 12 steps with 2 rails and CGA ascending and descending with a step to pattern with cues for "up with the good, down with the bad" technique as pt reported weakness in RLE>LLE. Pt performed all transfers with RW and supervision throughout session. Pt ambulated 169ft with RW and supervision; demonstrates flexed trunk/downard gaze and decreased cadence with narrow BOS. Pt required multiple rest/water breaks throughout session due to fatigue and nausea. O2 sat 96% and HR 72 bpm after ambulating. Performed x10 RLE standing marches with .5lb ankle weight then reported feeling "swimmy headed". Returned to sitting. BP: 150/72 HR 69bpm and O2 sat 98%. Pt reported improvement in symptoms after ~3 minutes. Stood and took BP: 127/67; pt denied any increase in symptoms. Pt transported back to room  in [facility] total A and requested to return to bed due to fatigue and transferred sit<>supine with supervision. Concluded session with pt supine in bed, needs within reach, and bed alarm on. Provided pt with extra blanket and heat pack.   Therapy Documentation Precautions:  Precautions Precautions: Fall Restrictions Weight Bearing Restrictions: No  Therapy/Group: Individual Therapy Alfonse Alpers PT, DPT   09/09/2020, 7:22 AM

## 2020-09-09 NOTE — Progress Notes (Signed)
Physical Therapy Discharge Summary  Patient Details  Name: Deanna Williams MRN: [id number] Date of Birth: [birth year]  Today's Date: 09/10/2020 PT Individual Time: 1300-1409 PT Individual Time Calculation (min): 69 min   Patient has met 8 of 8 long term goals due to improved activity tolerance, improved balance, improved postural control, increased strength, improved awareness and improved coordination. Patient to discharge at an ambulatory level Modified Independent. Patient's family did not attend family education training as pt to discharge back to [place] at a Mod I level overall. Pt demonstrates good safety awareness and understanding of importance of using RW upon discharge.   All goals met   Recommendation:  Patient will benefit from ongoing skilled PT services in outpatient setting to continue to advance safe functional mobility, address ongoing impairments in transfers, generalized strengthening, dynamic standing balance/coordination, gait training, endurance, and to minimize fall risk.  Equipment: RW  Reasons for discharge: treatment goals met  Patient/family agrees with progress made and goals achieved: Yes  Today's Interventions: Received pt sitting in recliner, pt agreeable to therapy, and denied any pain during session. Session with emphasis on discharge planning, functional mobility/transfers, generalized strengthening, dynamic standing balance/coordination, NMR, community navigation, ambulation, simulated car transfers, and improved activity tolerance. Pt reported urge to toilet and ambulated 62f mod I with RW to bathroom and able to perform all toileting tasks mod I. Pt stood at sink and washed hands mod I and [facility] 50M [facility]o [facility] purposes. Pt performed simulated car transfer mod I with RW and ambulated 163fon uneven surfaces (ramp) with RW and supervision. Pt able to pick up small cone from floor with RW and close  supervision. Pt transported to downstairs gift shop and ambulated >30018fod I with RW with emphasis on visual scanning and dual task to locate 3 items in store. Discussed various techniques for carrying items including using walker bag, having shopping cart, and having assist to carry other items. Also discussed energy conservation strategies and importance of taking frequent rest breaks. Pt reported feeling dizzy with prolonged upright activity and required multiple rest breaks throughout session for symptoms to resolve. Worked on dynamic standing balance tossing horseshoes with LUE with narrow BOS to challenge balance without AD and min A for balance x 4 trials. Transitioned to dynamic standing balance performing alternating toe taps to 3in step with min handheld assist 2x10 reps bilaterally. [facility] and ambulated back to bed mod I with RW. Pt performed bed mobility independently. Concluded session with pt supine in bed, needs within reach, and bed alarm on.   PT Discharge Precautions/Restrictions Precautions Precautions: Fall Restrictions Weight Bearing Restrictions: No Cognition Overall Cognitive Status: Within Functional Limits for tasks assessed Arousal/Alertness: Awake/alert Orientation Level: Oriented X4 Memory: Impaired Awareness: Appears intact Problem Solving: Appears intact Safety/Judgment: Appears intact Sensation Sensation Light Touch: Impaired by gross assessment Proprioception: Appears Intact Additional Comments: pt reports "tingling" sensation along RLE dermatomes Coordination Gross Motor Movements are Fluid and Coordinated: No Fine Motor Movements are Fluid and Coordinated: No Coordination and Movement Description: mild uncoordination due to R hemi, generalized weakness, and decreaed endurance. Finger Nose Finger Test: slower R>L Heel Shin Test: slower and decreased ROM R>L Motor  Motor Motor: Abnormal postural alignment and  control;Hemiplegia Motor - Skilled Clinical Observations: mild uncoordination due to R hemi, generalized weakness, and decreaed endurance.  Mobility Bed Mobility Bed Mobility: Rolling Right;Rolling Left;Sit to Supine;Supine to Sit  Rolling Right: Independent Rolling Left: Independent Supine to Sit: Independent Sit to Supine: Independent Transfers Transfers: Sit to Stand;Stand Pivot Transfers;Stand to Sit Sit to Stand: Independent with assistive device Stand to Sit: Independent with assistive device Stand Pivot Transfers: Independent with assistive device Transfer (Assistive device): Rolling walker Locomotion  Gait Ambulation: Yes Gait Assistance: Independent with assistive device Gait Distance (Feet): 300 Feet Assistive device: Rolling walker Gait Gait: Yes Gait Pattern: Impaired Gait Pattern: Narrow base of support;Decreased stride length;Poor foot clearance - left;Poor foot clearance - right;Trunk flexed;Decreased trunk rotation Gait velocity: decreased Stairs / Additional Locomotion Stairs: Yes Stairs Assistance: Contact Guard/Touching assist Stair Management Technique: Two rails Number of Stairs: 12 Height of Stairs: 6 Ramp: Supervision/Verbal cueing (RW) Wheelchair Mobility Wheelchair Mobility: No  Trunk/Postural Assessment  Cervical Assessment Cervical Assessment: Exceptions to [facility] (forward head) Thoracic Assessment Thoracic Assessment: Exceptions to [facility] (kyphosis) Lumbar Assessment Lumbar Assessment: Exceptions to [facility] (posterior pelvic tilt) Postural Control Postural Control: Deficits on evaluation  Balance Balance Balance Assessed: Yes Static Sitting Balance Static Sitting - Balance Support: Feet supported;No upper extremity supported Static Sitting - Level of Assistance: 7: Independent Dynamic Sitting Balance Dynamic Sitting - Balance Support: Feet supported;No upper extremity supported Dynamic Sitting - Level of Assistance: 6: Modified independent  (Device/Increase time) Static Standing Balance Static Standing - Balance Support: Bilateral upper extremity supported (RW) Static Standing - Level of Assistance: 6: Modified independent (Device/Increase time) Dynamic Standing Balance Dynamic Standing - Balance Support: Bilateral upper extremity supported (RW) Dynamic Standing - Level of Assistance: 6: Modified independent (Device/Increase time) Extremity Assessment RLE Assessment RLE Assessment: Exceptions to [facility] General Strength Comments: grossly generalized to 4-/5 LLE Assessment LLE Assessment: Exceptions to [facility] General Strength Comments: grossly generalized to 4/5  Callaway Hailes M Gloris Shiroma Clancy Leiner PT, DPT  09/09/2020, 1:52 PM

## 2020-09-09 NOTE — Progress Notes (Signed)
Patient ID: Deanna Williams, female   DOB: April 21, 1929, 87 y.o.   MRN: [id number]   Pt referral sent to [facility] for review.  [place], [place]

## 2020-09-09 NOTE — Progress Notes (Signed)
Occupational Therapy Session Note  Patient Details  Name: SAFIYAH CISNEY MRN: [id number] Date of Birth: 07-Sep-1928  Today's Date: 09/09/2020 OT Individual Time: 3557-3220 OT Individual Time Calculation (min): 42 min    Short Term Goals: Week 1:  OT Short Term Goal 1 (Week 1): STG= LTG d/t ELOS  Skilled Therapeutic Interventions/Progress Updates:    Treatment session with focus on self-care retraining and activity tolerance.  Pt received upright in recliner agreeable to therapy session.  Pt ambulated around room with RW with supervision to gather clothing prior to shower.  Therapist providing min cues for safe hand placement when attempting to transport items while ambulating with RW.  Pt completed bathing at sit > stand level in room shower at overall Mod I level with use of shower seat.  Pt ambulated back to recliner and completed dressing overall Mod I.  Pt completed oral care and hair drying when standing at sink with distant supervision.  Pt reports feeling "woozy" when drying hair but wanting to complete task before returning to sitting.  Pt remained upright in recliner with all needs in reach.  Therapy Documentation Precautions:  Precautions Precautions: Fall Restrictions Weight Bearing Restrictions: No Pain:  Pt with no c/o pain   Therapy/Group: Individual Therapy  Simonne Come 09/09/2020, 1:16 PM

## 2020-09-10 ENCOUNTER — Ambulatory Visit: Payer: PPO | Admitting: Podiatry

## 2020-09-10 NOTE — Progress Notes (Signed)
Speech Language Pathology Discharge Summary  Patient Details  Name: Deanna Williams MRN: [id number] Date of Birth: Nov 11, 1928  Today's Date: 09/10/2020 SLP Individual Time: 1021-1100 SLP Individual Time Calculation (min): 39 min   Skilled Therapeutic Interventions:  Skilled SLP intervention focused on cognition and patient education. Cognistat completed to further assess cognition and compare to initial evaluation. Pt demonstrated improvement in delayed recall and improvement in problem solving however min A with visual cueing still required. Education completed on strategies and external aids to increase memory. Pt dc'ing home tomorrow.     Patient has met 2 of 2 long term goals.  Patient to discharge at overall Modified Independent level.  Reasons goals not met:     Clinical Impression/Discharge Summary:   Pt has made excellent progress and has met 2/2 long term goals this admission period due to improved memory and problem solving. Pt is currently overall mod I for cognitive tasks and requires supervision cues for higher level functional problem solving tasks. Pt/family education completed and pt will dc to ILF with 24 hr supervision from daughter. No follow up slp services recommended.  Care Partner:  Caregiver Able to Provide Assistance: Yes;No  Type of Caregiver Assistance: Cognitive;Physical  Recommendation:  None      Equipment: note at this time   Reasons for discharge: Discharged from hospital   Patient/Family Agrees with Progress Made and Goals Achieved: Dub Mikes A Maritsa Hunsucker 09/10/2020, 10:49 AM

## 2020-09-10 NOTE — Progress Notes (Signed)
Patient ID: Deanna Williams, female   DOB: [birth year], 87 y.o.   MRN: [id number]  Pt approved by [place], [place]

## 2020-09-10 NOTE — Progress Notes (Signed)
PROGRESS NOTE   Subjective/Complaints: Still having some episodes of wooziness- self-resolved. Discussed may be from Eliquis and that symptoms may subside as her body gets used to the medication   ROS:  Pt denies SOB, abd pain, CP, N/V/C/D, and vision changes, +wooziness  Objective:   No results found. No results for input(s): WBC, HGB, HCT, PLT in the last 72 hours. No results for input(s): NA, K, CL, CO2, GLUCOSE, BUN, CREATININE, CALCIUM in the last 72 hours.  Intake/Output Summary (Last 24 hours) at 09/10/2020 1112 Last data filed at 09/10/2020 0700 Gross per 24 hour  Intake 896 ml  Output --  Net 896 ml        Physical Exam: Vital Signs Blood pressure 130/71, pulse 66, temperature 98.2 F (36.8 C), temperature source Oral, resp. rate 15, height 5\' 2"  (1.575 m), weight 66.2 kg, SpO2 99 %. Gen: no distress, normal appearing HEENT: oral mucosa pink and moist, NCAT Cardio: Reg rate Chest: normal effort, normal rate of breathing Abd: soft, non-distended Ext: no edema Psych: pleasant, normal affect Skin: intact Neuro: Alert and oriented x 3. Normal insight and awareness. Intact Memory. Normal language and speech. Cranial nerve exam unremarkable. RUE 4/5 prox to distal. RLE 3-4/5 prox to distal. LUE 4+/5. LLE 3+ to 4/5. Stocking glove sensory loss from knees to feet and bilateral hadns. DTR's 1+. No focal limb ataxia. No abnormal tone Musculoskeletal: Full ROM, No pain with AROM or PROM in the neck, trunk, or extremities. Posture appropriate    Assessment/Plan: 1. Functional deficits which require 3+ hours per day of interdisciplinary therapy in a comprehensive inpatient rehab setting.  Physiatrist is providing close team supervision and 24 hour management of active medical problems listed below.  Physiatrist and rehab team continue to assess barriers to discharge/monitor patient progress toward functional and medical  goals  Care Tool:  Bathing    Body parts bathed by patient: Right arm,Left arm,Chest,Abdomen,Front perineal area,Right upper leg,Buttocks,Left upper leg,Right lower leg,Left lower leg,Face         Bathing assist Assist Level: Independent with assistive device     Upper Body Dressing/Undressing Upper body dressing   What is the patient wearing?: Pull over shirt    Upper body assist Assist Level: Independent with assistive device    Lower Body Dressing/Undressing Lower body dressing      What is the patient wearing?: Underwear/pull up,Pants     Lower body assist Assist for lower body dressing: Independent with assitive device     Toileting Toileting    Toileting assist Assist for toileting: Supervision/Verbal cueing     Transfers Chair/bed transfer  Transfers assist     Chair/bed transfer assist level: Supervision/Verbal cueing     Locomotion Ambulation   Ambulation assist      Assist level: Supervision/Verbal cueing Assistive device: Walker-rolling Max distance: 185ft   Walk 10 feet activity   Assist     Assist level: Supervision/Verbal cueing Assistive device: Walker-rolling   Walk 50 feet activity   Assist    Assist level: Supervision/Verbal cueing Assistive device: Walker-rolling    Walk 150 feet activity   Assist    Assist level: Supervision/Verbal cueing Assistive device:  Walker-rolling    Walk 10 feet on uneven surface  activity   Assist Walk 10 feet on uneven surfaces activity did not occur: Safety/medical concerns         Wheelchair     Assist Will patient use wheelchair at discharge?: No             Wheelchair 50 feet with 2 turns activity    Assist            Wheelchair 150 feet activity     Assist          Blood pressure 130/71, pulse 66, temperature 98.2 F (36.8 C), temperature source Oral, resp. rate 15, height 5\' 2"  (1.575 m), weight 66.2 kg, SpO2 99 %.  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 Pain Management:N/A 4. Mood:LCSW to follow for evaluation and support. -antipsychotic agents: N/A 5. Neuropsych: This patientiscapable of making decisions on herown behalf. 6. Skin/Wound Care:Routine pressure relief measures 7. Fluids/Electrolytes/Nutrition:Monitor I/O. Check lytes Monday. 8. HTN: better controlled, continue Norvasc daily 9. A fib: New diagnosis--now on Eliquis. -HR controlled 4/28, continue to monitor TID 10. Prediabetes: Hgb A1C-5.9. Add CM restrictions and educate patient on CM diet.  11. L-PCA stent: On ASA, Brilinta and Lipitor. 12. Hyponatremia: monitor outpatient 13. Constipation: miralax daily warmed in prune juice. Add colace TID with meals.   4/27- constipation resolved for now- had BM.  31. Wooziness: discussed that may be from Eliquis- her only recent new med. Discussed importance of Eliquis for controlling afib and preventing recurrent stroke     LOS: 5 days A FACE TO FACE EVALUATION WAS PERFORMED  Rubina Basinski P Carrina Schoenberger 09/10/2020, 11:12 AM

## 2020-09-10 NOTE — Discharge Summary (Signed)
Deanna Williams Discharge Summary  Patient ID: Deanna Williams MRN: [id number] DOB/AGE: [birth year] 87 y.o.  Admit date: 09/05/2020 Discharge date:  09/11/20  Discharge Diagnoses:  Principal Problem:   Acute ischemic left PCA stroke [facility]) Active Problems:   Acid reflux   Prediabetes   BP (high blood pressure)   Atrial fibrillation (HCC)   S/P angioplasty with PCA stent   Discharged Condition: stable   Significant Diagnostic Studies: N/A   Labs:  Basic Metabolic Panel: BMP Latest Ref Rng & Units 09/07/2020 09/05/2020 09/04/2020  Glucose 70 - 99 mg/dL 109(H) 128(H) 119(H)  BUN 8 - 23 mg/dL 19 13 11   Creatinine 0.44 - 1.00 mg/dL 0.89 0.74 0.71  BUN/Creat Ratio 12 - 28 - - -  Sodium 135 - 145 mmol/L 134(L) 136 135  Potassium 3.5 - 5.1 mmol/L 4.0 4.3 3.9  Chloride 98 - 111 mmol/L 103 106 105  CO2 22 - 32 mmol/L 26 23 22   Calcium 8.9 - 10.3 mg/dL 8.9 9.2 8.7(L)    CBC: CBC Latest Ref Rng & Units 09/07/2020 09/05/2020 09/04/2020  WBC 4.0 - 10.5 K/uL 5.8 6.7 6.7  Hemoglobin 12.0 - 15.0 g/dL 12.2 12.9 12.7  Hematocrit 36.0 - 46.0 % 36.8 38.2 38.5  Platelets 150 - 400 K/uL 246 225 221    CBG: No results for input(s): GLUCAP in the last 168 hours.  Brief HPI:   Deanna Williams is a 87 y.o. female with history of HTN, GERD, lichen sclerosis who was admitted on 09/01/2020 with right-sided weakness and numbness.  She was found to have left occipital, parietal cortex infarct with hyperdense sign signaling acute thrombosis.  She underwent cerebral angio with thrombectomy and rescue stent for reocclusion by Dr. Estanislado Pandy.  Follow-up MRI brain showed left PCA ischemia involving occipital, medial temporal and thalamus.  Dr. Erlinda Hong recommended loop placement due to concerns of embolic source however she was found to be in A. fib on 04/22.  Eliquis was recommended by Dr. Curt Bears with plans to follow-up in office.  She continued to be limited by balance deficits with weakness as well as high-level cognitive  deficits.  CIR was recommended due to functional decline.   Hospital Course: Deanna Williams was admitted to rehab 09/05/2020 for inpatient therapies to consist of PT, ST and OT at least three hours five days a week. Past admission physiatrist, therapy team and rehab RN have worked together to provide customized collaborative inpatient rehab. Her blood pressures were monitored on TID basis and have been controlled on Norvasc daily.  Heart rate has been monitored on 3 times daily basis and currently appears to be in NSR.  She continues on ASA/Brilinta/Eliquis and serial check of CBC shows H&H and platelets to be stable.  Follow up BMET revealed mild hyponatremia and recommend repeat labs in 1-2 weeks to monitor for recovery.  Constipation is resolved with augmentation of bowel program and she continues on Colace 3 times daily.  She is continent of bowel and bladder.  She has made good gains during her rehab stay and currently at modified independent in supervised setting. She will continue to receive follow-up home health PT, OT by [facility] after discharge.   Rehab course: During patient's stay in rehab team conference was held to monitor patient's progress, set goals and discuss barriers to discharge. At admission, patient required min assist with basic ADL tasks and with mobility.  She exhibited mild high-level deficits with complex tasks.She  has had improvement in activity tolerance,  balance, postural control as well as ability to compensate for deficits.  She is able to complete ADL tasks at modified independent level. She is independent for transfers and to ambulate >300' with RW. She has had improvement in memory and problem-solving.  She is able to complete cognitive tasks at modified independent level requires supervision for high-level functional tasks.  Discharge disposition: 01-Home or Self Care  Diet: Heart Healthy.   Special Instructions: 1. No driving or strenuous activity till  cleared by MD.   Discharge Instructions    Ambulatory referral to Physical Medicine Rehab   Complete by: As directed    1-2 week TC appt     Allergies as of 09/11/2020   No Known Allergies     Medication List    TAKE these medications   acetaminophen 325 MG tablet Commonly known as: TYLENOL Take 1-2 tablets (325-650 mg total) by mouth every 4 (four) hours as needed for mild pain.   amLODipine 10 MG tablet Commonly known as: NORVASC Take 1 tablet (10 mg total) by mouth daily.   apixaban 5 MG Tabs tablet Commonly known as: ELIQUIS Take 1 tablet (5 mg total) by mouth 2 (two) times daily.   atorvastatin 10 MG tablet Commonly known as: LIPITOR Take 2 tablets (20 mg total) by mouth daily.   docusate sodium 100 MG capsule Commonly known as: COLACE Take 1 capsule (100 mg total) by mouth 3 (three) times daily. Notes to patient: For constipation   ticagrelor 90 MG Tabs tablet Commonly known as: BRILINTA Take 1 tablet (90 mg total) by mouth 2 (two) times daily.       Follow-up Information    Raulkar, Clide Deutscher, MD Follow up.   Specialty: Physical Medicine and Rehabilitation Why: 09/23/20 please arrive in office at 9:00 for a 9:20am appointment Contact information: [address] [place] [address] [phone]        Jerrol Banana., MD. Call.   Specialty: Family Medicine Why: for post hospital follow up Contact information: [address] [place] [address] [phone]        Constance Haw, MD Follow up on 10/09/2020.   Specialty: Cardiology Why: Keep scheduled appt Contact information: [address] [place] [address] [phone]        [facility]. Call on 09/14/2020.   Specialty: Neurology Why: for post stroke follow up Contact information: [address] [place] [phone]              Signed: Bary Leriche 09/14/2020, 12:48  PM

## 2020-09-10 NOTE — Progress Notes (Signed)
Inpatient Rehabilitation Care Coordinator Discharge Note  The overall goal for the admission was met for:   Discharge location: Yes, [facility]  Length of Stay: Yes, 6 Days   Discharge activity level: Yes, MOD I/Sup  Home/community participation: Yes  Services provided included: MD, RD, PT, OT, SLP, RN, CM, TR, Pharmacy, Neuropsych and SW  Financial Services: Private Insurance: HTA  Choices offered to/list presented to:pt   Follow-up services arranged: Home Health: [facility]  Comments (or additional information): PT OT  Rolling Walker   Patient/Family verbalized understanding of follow-up arrangements: Yes  Individual responsible for coordination of the follow-up plan: Pam [phone]  Confirmed correct DME delivered: Dyanne Iha 09/10/2020    Dyanne Iha

## 2020-09-10 NOTE — Progress Notes (Signed)
Occupational Therapy Session Note  Patient Details  Name: Deanna Williams MRN: [id number] Date of Birth: 1928/11/29  Today's Date: 09/10/2020 OT Individual Time: 7619-5093 OT Individual Time Calculation (min): 54 min    Short Term Goals: Week 1:  OT Short Term Goal 1 (Week 1): STG= LTG d/t ELOS  Skilled Therapeutic Interventions/Progress Updates:    Pt in bed with nursing present to start session.  She was able to complete supine to sit with modified independence using the rail for support.  She then needed min assist for donning the right shoe as she could not put her heel into it secondary to swelling.  She was then able to complete bed to wheelchair transfer with use of the RW and modified independent level.  Instructional cueing for hand placement with sit to stand and for stand to sit.  She was rolled down to the ADL apartment where she worked on Psychiatrist tasks with use of the RW.  She was able to transport items around the kitchen as well as removing pan from the stove and items from the refrigerator with modified independence.  Min instructional cueing to maintain upright posture and ambulate a little closer to the RW.  She was given a walker bag to use as well for transporting some items.  She reports eating at her bar in the kitchen so she can transport items with the use of the countertops easily.  Finished session with return to the room via wheelchair and transfer back to the recliner with modified independence to rest.  Call button and phone in reach with safety alarm in place.    Therapy Documentation Precautions:  Precautions Precautions: Fall Restrictions Weight Bearing Restrictions: No   Pain: Pain Assessment Pain Scale: Faces Pain Score: 0-No pain Faces Pain Scale: No hurt ADL: See Care Tool Section for some details of mobility and selfcare  Therapy/Group: Individual Therapy  Lafonda Patron OTR/L 09/10/2020, 12:14 PM

## 2020-09-11 MED ORDER — ATORVASTATIN CALCIUM 10 MG PO TABS: 20.0000 mg | ORAL_TABLET | Freq: Every day | ORAL | 0 refills | Status: DC

## 2020-09-11 MED ORDER — AMLODIPINE BESYLATE 10 MG PO TABS: 10.0000 mg | ORAL_TABLET | Freq: Every day | ORAL | 0 refills | Status: DC

## 2020-09-11 MED ORDER — DOCUSATE SODIUM 100 MG PO CAPS: 100.0000 mg | ORAL_CAPSULE | Freq: Three times a day (TID) | ORAL | 0 refills | Status: DC

## 2020-09-11 MED ORDER — APIXABAN 5 MG PO TABS: 5.0000 mg | ORAL_TABLET | Freq: Two times a day (BID) | ORAL | 0 refills | Status: DC

## 2020-09-11 MED ORDER — TICAGRELOR 90 MG PO TABS: 90.0000 mg | ORAL_TABLET | Freq: Two times a day (BID) | ORAL | 0 refills | Status: DC

## 2020-09-11 NOTE — Progress Notes (Signed)
PROGRESS NOTE   Subjective/Complaints: No complaints this morning Provided with list of foods to help with her constipation at home Vitals stable Stable for d/c today   ROS:  Pt denies SOB, abd pain, CP, N/V/C/D, and vision changes, +wooziness, +constipation   Objective:   No results found. No results for input(s): WBC, HGB, HCT, PLT in the last 72 hours. No results for input(s): NA, K, CL, CO2, GLUCOSE, BUN, CREATININE, CALCIUM in the last 72 hours.  Intake/Output Summary (Last 24 hours) at 09/11/2020 0925 Last data filed at 09/11/2020 7494 Gross per 24 hour  Intake 1015 ml  Output --  Net 1015 ml        Physical Exam: Vital Signs Blood pressure 136/63, pulse 66, temperature (!) 97.4 F (36.3 C), temperature source Oral, resp. rate 18, height 5\' 2"  (1.575 m), weight 66.2 kg, SpO2 93 %. Gen: no distress, normal appearing HEENT: oral mucosa pink and moist, NCAT Cardio: Reg rate Chest: normal effort, normal rate of breathing Abd: soft, non-distended Ext: no edema Psych: pleasant, normal affect Skin: intact Neuro: Alert and oriented x 3. Normal insight and awareness. Intact Memory. Normal language and speech. Cranial nerve exam unremarkable. RUE 4/5 prox to distal. RLE 3-4/5 prox to distal. LUE 4+/5. LLE 3+ to 4/5. Stocking glove sensory loss from knees to feet and bilateral hadns. DTR's 1+. No focal limb ataxia. No abnormal tone Musculoskeletal: Full ROM, No pain with AROM or PROM in the neck, trunk, or extremities. Posture appropriate    Assessment/Plan: 1. Functional deficits which require 3+ hours per day of interdisciplinary therapy in a comprehensive inpatient rehab setting.  Physiatrist is providing close team supervision and 24 hour management of active medical problems listed below.  Physiatrist and rehab team continue to assess barriers to discharge/monitor patient progress toward functional and medical  goals  Care Tool:  Bathing    Body parts bathed by patient: Right arm,Left arm,Chest,Abdomen,Front perineal area,Right upper leg,Buttocks,Left upper leg,Right lower leg,Left lower leg,Face         Bathing assist Assist Level: Independent with assistive device     Upper Body Dressing/Undressing Upper body dressing   What is the patient wearing?: Pull over shirt    Upper body assist Assist Level: Independent with assistive device    Lower Body Dressing/Undressing Lower body dressing      What is the patient wearing?: Underwear/pull up,Pants     Lower body assist Assist for lower body dressing: Independent with assitive device     Toileting Toileting    Toileting assist Assist for toileting: Supervision/Verbal cueing     Transfers Chair/bed transfer  Transfers assist     Chair/bed transfer assist level: Independent with assistive device Chair/bed transfer assistive device: Programmer, multimedia   Ambulation assist      Assist level: Independent with assistive device Assistive device: Walker-rolling Max distance: >319ft   Walk 10 feet activity   Assist     Assist level: Independent with assistive device Assistive device: Walker-rolling   Walk 50 feet activity   Assist    Assist level: Independent with assistive device Assistive device: Walker-rolling    Walk 150 feet activity  Assist    Assist level: Independent with assistive device Assistive device: Walker-rolling    Walk 10 feet on uneven surface  activity   Assist Walk 10 feet on uneven surfaces activity did not occur: Safety/medical concerns   Assist level: Supervision/Verbal cueing Assistive device: Aeronautical engineer Will patient use wheelchair at discharge?: No             Wheelchair 50 feet with 2 turns activity    Assist            Wheelchair 150 feet activity     Assist          Blood pressure 136/63,  pulse 66, temperature (!) 97.4 F (36.3 C), temperature source Oral, resp. rate 18, height 5\' 2"  (1.575 m), weight 66.2 kg, SpO2 93 %.  Medical Problem List and Plan: 1.Functional deficits and mild right hemiparesissecondary to left PCA infarct -patient may shower -ELOS/Goals: 7-10 days, supervision to mod I with PT and OT  -d/c home today -antiplatelet therapy: ASA/Brilinta 3. Pain Management:N/A 4. Mood:LCSW to follow for evaluation and support. -antipsychotic agents: N/A 5. Neuropsych: This patientiscapable of making decisions on herown behalf. 6. Skin/Wound Care:Routine pressure relief measures 7. Fluids/Electrolytes/Nutrition:Monitor I/O. Check lytes Monday. 8. HTN: better controlled, continue Norvasc daily 9. A fib: New diagnosis--now on Eliquis. -HR controlled 4/29, continue Eliquis 10. Prediabetes: Hgb A1C-5.9. Add CM restrictions and educate patient on CM diet.  11. L-PCA stent: Continue ASA, Brilinta and Lipitor. 12. Hyponatremia: monitor outpatient 13. Constipation: miralax daily warmed in prune juice. Add colace TID with meals. Provided list of foods that can help with constipation.  4/27- constipation resolved for now- had BM.  36. Wooziness: discussed that may be from Eliquis- her only recent new med. Discussed importance of Eliquis for controlling afib and preventing recurrent stroke    >30 minutes spent in discharge of patient including review of medications and follow-up appointments, physical examination, and in answering all patient's questions    LOS: 6 days A FACE TO FACE EVALUATION WAS [place] 09/11/2020, 9:25 AM

## 2020-09-14 DIAGNOSIS — M6281 Muscle weakness (generalized): Secondary | ICD-10-CM | POA: Diagnosis not present

## 2020-09-14 DIAGNOSIS — R278 Other lack of coordination: Secondary | ICD-10-CM | POA: Diagnosis not present

## 2020-09-14 DIAGNOSIS — I6389 Other cerebral infarction: Secondary | ICD-10-CM | POA: Diagnosis not present

## 2020-09-14 DIAGNOSIS — R262 Difficulty in walking, not elsewhere classified: Secondary | ICD-10-CM | POA: Diagnosis not present

## 2020-09-14 DIAGNOSIS — Z95820 Peripheral vascular angioplasty status with implants and grafts: Secondary | ICD-10-CM

## 2020-09-15 ENCOUNTER — Telehealth: Payer: Self-pay | Admitting: *Deleted

## 2020-09-15 NOTE — Telephone Encounter (Addendum)
Transitional Care call--I spoke with daughter Pam    1. Are you/is patient experiencing any problems since coming home? Are there any questions regarding any aspect of care? NO 2. Are there any questions regarding medications administration/dosing? Are meds being taken as prescribed? Patient should review meds with caller to confirm all meds received and taking 3. Have there been any falls?NO 4. Has Home Health been to the house and/or have they contacted you? If not, have you tried to contact them? Can we help you contact them?They have not called but they talked in the hospital. She actually lives in[place]" in [place] and they have therapist there and she is set up to see him. She has someone staying with her around the clock. 5. Are bowels and bladder emptying properly? Are there any unexpected incontinence issues? If applicable, is patient following bowel/bladder programs? NO PROBLEM 6. Any fevers, problems with breathing, unexpected pain? NO 7. Are there any skin problems or new areas of breakdown? NO 8. Has the patient/family member arranged specialty MD follow up (ie cardiology/neurology/renal/surgical/etc)?  Can we help arrange? Appts made Will need to change the appt with Dr Ralukar.(set for 09/23/20 @9 :20) 9. Does the patient need any other services or support that we can help arrange? NO 10. Are caregivers following through as expected in assisting the patient? YES 11. Has the patient quit smoking, drinking alcohol, or using drugs as recommended? NO DRIVING OR STRENUOUS ACTIVITY UNTIL RELEASED BY MD   Appointment : Beatris Ship 09/22/20 @9 :40 arrive by 9:20 to see Dr Darnelle Maffucci to watch for paperwork in mail from our office [address]

## 2020-09-16 ENCOUNTER — Ambulatory Visit (INDEPENDENT_AMBULATORY_CARE_PROVIDER_SITE_OTHER): Payer: PPO | Admitting: Family Medicine

## 2020-09-16 ENCOUNTER — Other Ambulatory Visit: Payer: Self-pay

## 2020-09-16 ENCOUNTER — Encounter: Payer: Self-pay | Admitting: Family Medicine

## 2020-09-16 VITALS — BP 138/70 | HR 72 | Temp 98.3°F | Resp 16 | Wt 153.0 lb

## 2020-09-16 DIAGNOSIS — I63532 Cerebral infarction due to unspecified occlusion or stenosis of left posterior cerebral artery: Secondary | ICD-10-CM | POA: Diagnosis not present

## 2020-09-16 DIAGNOSIS — K219 Gastro-esophageal reflux disease without esophagitis: Secondary | ICD-10-CM

## 2020-09-16 DIAGNOSIS — I4891 Unspecified atrial fibrillation: Secondary | ICD-10-CM

## 2020-09-16 DIAGNOSIS — R262 Difficulty in walking, not elsewhere classified: Secondary | ICD-10-CM | POA: Diagnosis not present

## 2020-09-16 DIAGNOSIS — E7800 Pure hypercholesterolemia, unspecified: Secondary | ICD-10-CM | POA: Diagnosis not present

## 2020-09-16 DIAGNOSIS — Z95820 Peripheral vascular angioplasty status with implants and grafts: Secondary | ICD-10-CM

## 2020-09-16 DIAGNOSIS — I6389 Other cerebral infarction: Secondary | ICD-10-CM | POA: Diagnosis not present

## 2020-09-16 DIAGNOSIS — M6281 Muscle weakness (generalized): Secondary | ICD-10-CM | POA: Diagnosis not present

## 2020-09-16 DIAGNOSIS — I10 Essential (primary) hypertension: Secondary | ICD-10-CM

## 2020-09-16 DIAGNOSIS — R278 Other lack of coordination: Secondary | ICD-10-CM | POA: Diagnosis not present

## 2020-09-16 MED ORDER — ATORVASTATIN CALCIUM 20 MG PO TABS: 20.0000 mg | ORAL_TABLET | Freq: Every day | ORAL | 3 refills | Status: DC

## 2020-09-16 NOTE — Patient Instructions (Signed)
Get knee high support hose 

## 2020-09-16 NOTE — Progress Notes (Signed)
Established patient visit   Patient: Deanna Williams   DOB: 11-21-1928   87 y.o. Female  MRN: [id number] Visit Date: 09/16/2020  Today's healthcare provider: [name], MD   Chief Complaint  Patient presents with  . Hospitalization Follow-up   Subjective    HPI  Patient comes in today for follow-up after having posterior cerebral artery CVA with improving residual right-sided weakness.  This was felt to be embolic and probable atrial fibrillation.  She has now Eliquis.  She has some mild cognitive deficit from the stroke.  She is out of rehab and back in her assisted living. Follow up Hospitalization  Patient was admitted to [facility] on 09/01/2020 and discharged on 09/05/2020. She was treated for Stroke. Patient was discharged to Rehab and released from there on 09/11/2020. Treatment for this included; see note in chart. Patient reports that she is tolerating amlodipine, Eliuqis, and Lipitor well.  Telephone follow up was done on 09/15/2020 She reports good compliance with treatment. She reports this condition is improved.       Medications: Outpatient Medications Prior to Visit  Medication Sig  . acetaminophen (TYLENOL) 325 MG tablet Take 1-2 tablets (325-650 mg total) by mouth every 4 (four) hours as needed for mild pain.  Marland Kitchen amLODipine (NORVASC) 10 MG tablet Take 1 tablet (10 mg total) by mouth daily.  Marland Kitchen apixaban (ELIQUIS) 5 MG TABS tablet Take 1 tablet (5 mg total) by mouth 2 (two) times daily.  Marland Kitchen atorvastatin (LIPITOR) 10 MG tablet Take 2 tablets (20 mg total) by mouth daily.  Marland Kitchen docusate sodium (COLACE) 100 MG capsule Take 1 capsule (100 mg total) by mouth 3 (three) times daily.  . ticagrelor (BRILINTA) 90 MG TABS tablet Take 1 tablet (90 mg total) by mouth 2 (two) times daily.   No facility-administered medications prior to visit.    Review of Systems  Constitutional: Negative for appetite change, chills, fatigue and fever.  Respiratory: Negative for chest  tightness and shortness of breath.   Cardiovascular: Negative for chest pain and palpitations.  Gastrointestinal: Negative for abdominal pain, nausea and vomiting.  Neurological: Negative for dizziness and weakness.        Objective    BP 138/70   Pulse 72   Temp 98.3 F (36.8 C)   Resp 16   Wt 153 lb (69.4 kg)   SpO2 96%   BMI 27.98 kg/m  BP Readings from Last 3 Encounters:  09/17/20 (!) 143/76  09/16/20 138/70  09/11/20 136/63   Wt Readings from Last 3 Encounters:  09/16/20 153 lb (69.4 kg)  09/05/20 145 lb 15.1 oz (66.2 kg)  09/04/20 148 lb 9.4 oz (67.4 kg)       Physical Exam Vitals reviewed.  Constitutional:      Appearance: She is well-developed.  HENT:     Head: Normocephalic and atraumatic.     Right Ear: External ear normal.     Left Ear: External ear normal.     Nose: Nose normal.  Eyes:     General: No scleral icterus.    Conjunctiva/sclera: Conjunctivae normal.     Pupils: Pupils are equal, round, and reactive to light.  Neck:     Thyroid: No thyromegaly.  Cardiovascular:     Rate and Rhythm: Normal rate and regular rhythm.     Heart sounds: Normal heart sounds.  Pulmonary:     Effort: Pulmonary effort is normal.     Breath sounds: Normal breath sounds.  Abdominal:     Palpations: Abdomen is soft.     Tenderness: There is no abdominal tenderness.  Lymphadenopathy:     Cervical: No cervical adenopathy.  Skin:    General: Skin is warm and dry.  Neurological:     General: No focal deficit present.     Mental Status: She is alert and oriented to person, place, and time.     Comments: Mild right sided weakness particularly in the upper arm.  Psychiatric:        Mood and Affect: Mood normal.        Behavior: Behavior normal.        Thought Content: Thought content normal.        Judgment: Judgment normal.       No results found for any visits on 09/16/20.  Assessment & Plan     1. Acute ischemic left PCA stroke (HCC) On Eliquis.   Control blood pressure.  Platelet therapy per neurology - atorvastatin (LIPITOR) 20 MG tablet; Take 1 tablet (20 mg total) by mouth daily.  Dispense: 90 tablet; Refill: 3  2. Atrial fibrillation, unspecified type ([place]) Eliquis  3. Primary hypertension   4. Gastroesophageal reflux disease without esophagitis   5. Hypercholesterolemia Atorvastatin.  Recent 20 mg dose  6. S/P angioplasty with PCA stent    No follow-ups on file.      I, [name], MD, have reviewed all documentation for this visit. The documentation on 09/19/20 for the exam, diagnosis, procedures, and orders are all accurate and complete.    Marven Veley Cranford Mon, MD  [facility] [phone] (phone) [phone] (fax)  [place]

## 2020-09-17 ENCOUNTER — Emergency Department
Admission: EM | Admit: 2020-09-17 | Discharge: 2020-09-17 | Disposition: A | Discharge status: Home / Self Care | Payer: PPO | Attending: Emergency Medicine | Admitting: Emergency Medicine

## 2020-09-17 ENCOUNTER — Ambulatory Visit: Payer: PPO

## 2020-09-17 ENCOUNTER — Emergency Department: Payer: PPO

## 2020-09-17 DIAGNOSIS — R202 Paresthesia of skin: Secondary | ICD-10-CM | POA: Diagnosis not present

## 2020-09-17 DIAGNOSIS — G4489 Other headache syndrome: Secondary | ICD-10-CM | POA: Diagnosis not present

## 2020-09-17 DIAGNOSIS — Z955 Presence of coronary angioplasty implant and graft: Secondary | ICD-10-CM | POA: Insufficient documentation

## 2020-09-17 DIAGNOSIS — Z7901 Long term (current) use of anticoagulants: Secondary | ICD-10-CM | POA: Diagnosis not present

## 2020-09-17 DIAGNOSIS — I10 Essential (primary) hypertension: Secondary | ICD-10-CM | POA: Insufficient documentation

## 2020-09-17 DIAGNOSIS — I63532 Cerebral infarction due to unspecified occlusion or stenosis of left posterior cerebral artery: Secondary | ICD-10-CM | POA: Insufficient documentation

## 2020-09-17 DIAGNOSIS — R29818 Other symptoms and signs involving the nervous system: Secondary | ICD-10-CM | POA: Diagnosis not present

## 2020-09-17 DIAGNOSIS — I6602 Occlusion and stenosis of left middle cerebral artery: Secondary | ICD-10-CM | POA: Diagnosis not present

## 2020-09-17 DIAGNOSIS — Z79899 Other long term (current) drug therapy: Secondary | ICD-10-CM | POA: Insufficient documentation

## 2020-09-17 LAB — DIFFERENTIAL
Abs Immature Granulocytes: 0.02 10*3/uL (ref 0.00–0.07)
Basophils Absolute: 0 10*3/uL (ref 0.0–0.1)
Basophils Relative: 1 %
Eosinophils Absolute: 0.2 10*3/uL (ref 0.0–0.5)
Eosinophils Relative: 4 %
Immature Granulocytes: 0 %
Lymphocytes Relative: 25 %
Lymphs Abs: 1.5 10*3/uL (ref 0.7–4.0)
Monocytes Absolute: 0.5 10*3/uL (ref 0.1–1.0)
Monocytes Relative: 8 %
Neutro Abs: 3.8 10*3/uL (ref 1.7–7.7)
Neutrophils Relative %: 62 %

## 2020-09-17 LAB — COMPREHENSIVE METABOLIC PANEL
ALT: 12 U/L (ref 0–44)
AST: 17 U/L (ref 15–41)
Albumin: 3.5 g/dL (ref 3.5–5.0)
Alkaline Phosphatase: 66 U/L (ref 38–126)
Anion gap: 7 (ref 5–15)
BUN: 16 mg/dL (ref 8–23)
CO2: 22 mmol/L (ref 22–32)
Calcium: 8.9 mg/dL (ref 8.9–10.3)
Chloride: 108 mmol/L (ref 98–111)
Creatinine, Ser: 0.65 mg/dL (ref 0.44–1.00)
GFR, Estimated: >60 mL/min (ref >60)
Glucose, Bld: 115 mg/dL — ABNORMAL HIGH (ref 70–99)
Potassium: 3.5 mmol/L (ref 3.5–5.1)
Sodium: 137 mmol/L (ref 135–145)
Total Bilirubin: 0.8 mg/dL (ref 0.3–1.2)
Total Protein: 5.9 g/dL — ABNORMAL LOW (ref 6.5–8.1)

## 2020-09-17 LAB — CBC
HCT: 37 % (ref 36.0–46.0)
Hemoglobin: 12.4 g/dL (ref 12.0–15.0)
MCH: 31.5 pg (ref 26.0–34.0)
MCHC: 33.5 g/dL (ref 30.0–36.0)
MCV: 93.9 fL (ref 80.0–100.0)
Platelets: 263 10*3/uL (ref 150–400)
RBC: 3.94 MIL/uL (ref 3.87–5.11)
RDW: 12.9 % (ref 11.5–15.5)
WBC: 6 10*3/uL (ref 4.0–10.5)
nRBC: 0 % (ref 0.0–0.2)

## 2020-09-17 LAB — URINALYSIS, ROUTINE W REFLEX MICROSCOPIC
Bilirubin Urine: NEGATIVE
Glucose, UA: NEGATIVE mg/dL
Hgb urine dipstick: NEGATIVE
Ketones, ur: NEGATIVE mg/dL
Leukocytes,Ua: NEGATIVE
Nitrite: NEGATIVE
Protein, ur: NEGATIVE mg/dL
Specific Gravity, Urine: 1.003 — ABNORMAL LOW (ref 1.005–1.030)
pH: 8 (ref 5.0–8.0)

## 2020-09-17 LAB — PROTIME-INR
INR: 1.3 — ABNORMAL HIGH (ref 0.8–1.2)
Prothrombin Time: 16.3 seconds — ABNORMAL HIGH (ref 11.4–15.2)

## 2020-09-17 LAB — APTT: aPTT: 45 seconds — ABNORMAL HIGH (ref 24–36)

## 2020-09-17 NOTE — ED Notes (Signed)
ERP at bedside

## 2020-09-17 NOTE — ED Provider Notes (Signed)
[facility] Emergency Department Provider Note  ____________________________________________   Event Date/Time   First MD Initiated Contact with Patient 09/17/20 0206     (approximate)  I have reviewed the triage vital signs and the nursing notes.   HISTORY  Chief Complaint Numbness (Pt arrives to ED from home c/o worsening numbness in BLE & pain in RLE.  Pt also c/o worsening numbness in R hand. Hx of CVA on 09/04/20.)    HPI Deanna Williams is a 87 y.o. female with history of hypertension, hyperlipidemia, recent CVA status post PCA stent who presents to the emergency department with concerns for worsening right-sided numbness.  She states that her numbness has waxed and waned since her stroke April 19.  She states at some point yesterday she felt like the numbness got worse but she is unable to tell me when.  States that it is in her right arm, right leg and also a little bit in her left calf.  States these are the same areas of numbness that she had when she had her stroke in April.  She did have a headache that has resolved.  No head injury.  She is on Eliquis.  No new weakness.  No vision or speech changes.  No chest pain or shortness of breath.  No fevers, cough, vomiting or diarrhea.  Patient presents with EMS from an independent living facility.  On review of her records it appears she was admitted to the hospital 09/01/2020 -09/11/2020.  MRI of her brain showed a left PCA territory acute ischemic infarct involving the left occipital lobe, medial left temporal lobe and left thalamus.        Past Medical History:  Diagnosis Date  . Gastroesophageal reflux disease   . Hyperlipidemia   . Hypertension   . Lichen sclerosus   . Lichen sclerosus et atrophicus   . Osteopenia   . Palpitations     Patient Active Problem List   Diagnosis Date Noted  . S/P angioplasty with PCA stent 09/14/2020  . Atrial fibrillation (HCC) 09/05/2020  . Acute ischemic left  PCA stroke (HCC) 09/01/2020  . Stroke (cerebrum) (HCC) 09/01/2020  . Acute cerebrovascular accident (CVA) due to occlusion of right posterior cerebral artery (HCC) 09/01/2020  . Stroke (HCC) 09/01/2020  . Hyperglycemia 08/31/2016  . Endometrial polyp 11/11/2015  . Lichen sclerosus 08/12/2015  . Osteoporosis 08/11/2015  . Allergic rhinitis 04/14/2015  . Blood pressure elevated 04/14/2015  . 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 Acid reflux 04/14/2015  . Prediabetes 04/14/2015  . Hypercholesterolemia 04/14/2015  . BP (high blood pressure) 04/14/2015  . Arthritis, degenerative 04/14/2015  . Osteopenia 04/14/2015  . Awareness of heartbeats 04/14/2015    Past Surgical History:  Procedure Laterality Date  . CARPAL TUNNEL RELEASE    . FOOT SURGERY    . HYSTEROSCOPY WITH D & C N/A 11/02/2015   Procedure: DILATATION AND CURETTAGE /HYSTEROSCOPY;  Surgeon: Herold HarmsMartin A Defrancesco, MD;  Location: [facility] ORS;  Service: Gynecology;  Laterality: N/A;  . IR CT HEAD LTD  09/01/2020  . IR INTRA CRAN STENT  09/01/2020  . IR PERCUTANEOUS ART THROMBECTOMY/INFUSION INTRACRANIAL INC DIAG ANGIO  09/01/2020  . RADIOLOGY WITH ANESTHESIA N/A 09/01/2020   Procedure: IR WITH ANESTHESIA;  Surgeon: Radiologist, Medication, MD;  Location: [facility] OR;  Service: Radiology;  Laterality: N/A;    Prior to Admission medications   Medication Sig Start Date End Date Taking? Authorizing Provider  acetaminophen (TYLENOL) 325 MG tablet Take 1-2 tablets (  325-650 mg total) by mouth every 4 (four) hours as needed for mild pain. 09/09/20   Love, Ivan Anchors, PA-C  amLODipine (NORVASC) 10 MG tablet Take 1 tablet (10 mg total) by mouth daily. 09/11/20   Love, Ivan Anchors, PA-C  apixaban (ELIQUIS) 5 MG TABS tablet Take 1 tablet (5 mg total) by mouth 2 (two) times daily. 09/11/20   Love, Ivan Anchors, PA-C  atorvastatin (LIPITOR) 20 MG tablet Take 1 tablet (20 mg total) by mouth daily. 09/16/20   Jerrol Banana., MD  docusate sodium (COLACE) 100 MG capsule  Take 1 capsule (100 mg total) by mouth 3 (three) times daily. 09/11/20   Love, Ivan Anchors, PA-C  ticagrelor (BRILINTA) 90 MG TABS tablet Take 1 tablet (90 mg total) by mouth 2 (two) times daily. 09/11/20   Bary Leriche, PA-C    Allergies Patient has no known allergies.  Family History  Problem Relation Age of Onset  . Cancer Mother   . Leukemia Mother   . Cancer Father        colon  . Hypertension Father   . Arthritis Sister   . Breast cancer Neg Hx     Social History Social History   Tobacco Use  . Smoking status: Never Smoker  . Smokeless tobacco: Never Used  Vaping Use  . Vaping Use: Never used  Substance Use Topics  . Alcohol use: Yes    Alcohol/week: 0.0 standard drinks    Comment: rare- wine  . Drug use: No    Review of Systems Constitutional: No fever. Eyes: No visual changes. ENT: No sore throat. Cardiovascular: Denies chest pain. Respiratory: Denies shortness of breath. Gastrointestinal: No nausea, vomiting, diarrhea. Genitourinary: Negative for dysuria. Musculoskeletal: Negative for back pain. Skin: Negative for rash. Neurological: Negative for focal weakness or numbness.  ____________________________________________   PHYSICAL EXAM:  VITAL SIGNS: ED Triage Vitals [09/17/20 0209]  Enc Vitals Group     BP (!) 147/71     Pulse Rate 71     Resp 16     Temp 98.1 F (36.7 C)     Temp Source Oral     SpO2 97 %     Weight      Height      Head Circumference      Peak Flow      Pain Score      Pain Loc      Pain Edu?      Excl. in [place]?    CONSTITUTIONAL: Alert and oriented x 4 and responds appropriately to questions. Well-appearing; well-nourished, elderly HEAD: Normocephalic EYES: Conjunctivae clear, pupils appear equal, EOM appear intact ENT: normal nose; moist mucous membranes NECK: Supple, normal ROM CARD: RRR; S1 and S2 appreciated; no murmurs, no clicks, no rubs, no gallops RESP: Normal chest excursion without splinting or tachypnea;  breath sounds clear and equal bilaterally; no wheezes, no rhonchi, no rales, no hypoxia or respiratory distress, speaking full sentences ABD/GI: Normal bowel sounds; non-distended; soft, non-tender, no rebound, no guarding, no peritoneal signs, no hepatosplenomegaly BACK: The back appears normal EXT: Normal ROM in all joints; no deformity noted, no edema; no cyanosis, 2+ radial and DP pulses bilaterally, ecchymosis noted to the right anterior thigh without induration, significant swelling or tenderness, no redness or warmth in this area SKIN: Normal color for age and race; warm; no rash on exposed skin NEURO: Moves all extremities equally, strength 5/5 in all 4 extremities, reports diminished sensation in right upper and lower  extremity compared to the left, cranial nerves II through XII intact, normal speech PSYCH: The patient's mood and manner are appropriate.  ____________________________________________   LABS (all labs ordered are listed, but only abnormal results are displayed)  Labs Reviewed  PROTIME-INR - Abnormal; Notable for the following components:      Result Value   Prothrombin Time 16.3 (*)    INR 1.3 (*)    All other components within normal limits  APTT - Abnormal; Notable for the following components:   aPTT 45 (*)    All other components within normal limits  COMPREHENSIVE METABOLIC PANEL - Abnormal; Notable for the following components:   Glucose, Bld 115 (*)    Total Protein 5.9 (*)    All other components within normal limits  URINALYSIS, ROUTINE W REFLEX MICROSCOPIC - Abnormal; Notable for the following components:   Color, Urine STRAW (*)    APPearance CLEAR (*)    Specific Gravity, Urine 1.003 (*)    All other components within normal limits  CBC  DIFFERENTIAL   ____________________________________________  EKG   EKG Interpretation  Date/Time:  Thursday Sep 17 2020 02:12:24 EDT Ventricular Rate:  78 PR Interval:  168 QRS Duration: 82 QT  Interval:  395 QTC Calculation: 450 R Axis:   41 Text Interpretation: Sinus rhythm Low voltage, precordial leads Confirmed by Pryor Curia [phone]) on 09/17/2020 2:15:15 AM       ____________________________________________  RADIOLOGY Jessie Foot Taydem Cavagnaro, personally viewed and evaluated these images (plain radiographs) as part of my medical decision making, as well as reviewing the written report by the radiologist.  ED MD interpretation: MRI shows no acute infarct.  Official radiology report(s): MR ANGIO HEAD WO CONTRAST  Result Date: 09/17/2020 CLINICAL DATA:  Acute neurologic deficit.  Recent stent placement. EXAM: MRI HEAD WITHOUT CONTRAST MRA HEAD WITHOUT CONTRAST TECHNIQUE: Multiplanar, multiecho pulse sequences of the brain and surrounding structures were obtained without intravenous contrast. Angiographic images of the head were obtained using MRA technique without contrast. COMPARISON:  None. FINDINGS: MRI HEAD FINDINGS Brain: Resolving punctate focus of diffusion restriction in the right frontal white matter. Unchanged pontine chronic microhemorrhage. There is multifocal hyperintense T2-weighted signal within the white matter. Generalized volume loss without a clear lobar predilection. The midline structures are normal. Vascular: Major flow voids are preserved. Skull and upper cervical spine: Normal calvarium and skull base. Visualized upper cervical spine and soft tissues are normal. Sinuses/Orbits:No paranasal sinus fluid levels or advanced mucosal thickening. No mastoid or middle ear effusion. Normal orbits. MRA HEAD FINDINGS POSTERIOR CIRCULATION: --Vertebral arteries: Normal --Inferior cerebellar arteries: Normal. --Basilar artery: Normal. --Superior cerebellar arteries: Normal. --Posterior cerebral arteries: There is a stent in the left PCA P1 and P2 segment with normal distal flow related enhancement. Right PCA is normal. ANTERIOR CIRCULATION: --Intracranial internal carotid arteries:  Normal. --Anterior cerebral arteries (ACA): Normal. --Middle cerebral arteries (MCA): Unchanged severe stenosis of the left MCA proximal M2 segment. Normal right MCA. ANATOMIC VARIANTS: None IMPRESSION: 1. Resolving punctate focus of diffusion restriction in the right frontal white matter. 2. Patent left PCA stent. 3. Unchanged severe stenosis of the left MCA proximal M2 segment. Electronically Signed   By: Ulyses Jarred M.D.   On: 09/17/2020 03:25   MR BRAIN WO CONTRAST  Result Date: 09/17/2020 CLINICAL DATA:  Acute neurologic deficit.  Recent stent placement. EXAM: MRI HEAD WITHOUT CONTRAST MRA HEAD WITHOUT CONTRAST TECHNIQUE: Multiplanar, multiecho pulse sequences of the brain and surrounding structures were obtained without intravenous contrast. Angiographic  images of the head were obtained using MRA technique without contrast. COMPARISON:  None. FINDINGS: MRI HEAD FINDINGS Brain: Resolving punctate focus of diffusion restriction in the right frontal white matter. Unchanged pontine chronic microhemorrhage. There is multifocal hyperintense T2-weighted signal within the white matter. Generalized volume loss without a clear lobar predilection. The midline structures are normal. Vascular: Major flow voids are preserved. Skull and upper cervical spine: Normal calvarium and skull base. Visualized upper cervical spine and soft tissues are normal. Sinuses/Orbits:No paranasal sinus fluid levels or advanced mucosal thickening. No mastoid or middle ear effusion. Normal orbits. MRA HEAD FINDINGS POSTERIOR CIRCULATION: --Vertebral arteries: Normal --Inferior cerebellar arteries: Normal. --Basilar artery: Normal. --Superior cerebellar arteries: Normal. --Posterior cerebral arteries: There is a stent in the left PCA P1 and P2 segment with normal distal flow related enhancement. Right PCA is normal. ANTERIOR CIRCULATION: --Intracranial internal carotid arteries: Normal. --Anterior cerebral arteries (ACA): Normal. --Middle  cerebral arteries (MCA): Unchanged severe stenosis of the left MCA proximal M2 segment. Normal right MCA. ANATOMIC VARIANTS: None IMPRESSION: 1. Resolving punctate focus of diffusion restriction in the right frontal white matter. 2. Patent left PCA stent. 3. Unchanged severe stenosis of the left MCA proximal M2 segment. Electronically Signed   By: Ulyses Jarred M.D.   On: 09/17/2020 03:25    ____________________________________________   PROCEDURES  Procedure(s) performed (including Critical Care):  Procedures   ____________________________________________   INITIAL IMPRESSION / ASSESSMENT AND PLAN / ED COURSE  As part of my medical decision making, I reviewed the following data within the [facility] notes reviewed and incorporated, Labs reviewed , EKG interpreted , Old EKG reviewed, Old chart reviewed, Radiograph reviewed  and Notes from prior ED visits         Patient here for concerns for acute stroke.  Unable to tell me when she last felt normal.  Was just seen for a left PCA infarct and is status post stent at the end of April.  She is on Eliquis.  Did have a headache but this has resolved.  States that the numbness she is having is in the same area that she had previously and that it has been waxing and waning since her last admission.  Will obtain MRI and MRI of the brain to evaluate for new infarct, hemorrhage, ensure stent is patent.  Will obtain labs, urine to ensure no electrolyte derangement, UTI, anemia that could be causing worsening of her stroke symptoms.  Patient would not be a tPA candidate given she is not able to give me a last known well.  ED PROGRESS  Patient's work-up today has been very reassuring.  Labs, urine unremarkable.  MRI of the brain today shows no new acute infarct.  She has a resolving punctate focus of diffusion restriction in the right frontal white matter, left patent PCA stent and unchanged severe stenosis of the left MCA  proximal M2 segment.  I do not feel she needs admission at this time given her MRI shows no acute change.  Discussed with patient that it can be normal for her symptoms to wax and wane after her last stroke.  I feel she is safe to be discharged and follow-up with her PCP, neurologist as scheduled.  Patient and granddaughter at bedside are comfortable with this plan.  At this time, I do not feel there is any life-threatening condition present. I have reviewed, interpreted and discussed all results (EKG, imaging, lab, urine as appropriate) and exam findings with patient/family. I  have reviewed nursing notes and appropriate previous records.  I feel the patient is safe to be discharged home without further emergent workup and can continue workup as an outpatient as needed. Discussed usual and customary return precautions. Patient/family verbalize understanding and are comfortable with this plan.  Outpatient follow-up has been provided as needed. All questions have been answered.  ____________________________________________   FINAL CLINICAL IMPRESSION(S) / ED DIAGNOSES  Final diagnoses:  Paresthesia     ED Discharge Orders    None      *Please note:  RAKIYA KRAWCZYK was evaluated in Emergency Department on 09/17/2020 for the symptoms described in the history of present illness. She was evaluated in the context of the global COVID-19 pandemic, which necessitated consideration that the patient might be at risk for infection with the SARS-CoV-2 virus that causes COVID-19. Institutional protocols and algorithms that pertain to the evaluation of patients at risk for COVID-19 are in a state of rapid change based on information released by regulatory bodies including the CDC and federal and state organizations. These policies and algorithms were followed during the patient's care in the ED.  Some ED evaluations and interventions may be delayed as a result of limited staffing during and the  pandemic.*   Note:  This document was prepared using Dragon voice recognition software and may include unintentional dictation errors.   Avah Bashor, Delice Bison, DO 09/17/20 [phone]

## 2020-09-17 NOTE — Discharge Instructions (Addendum)
Your lab work, urine were reassuring today.  MRIs of your brain showed no new strokes, bleeding and the stent in your brain was open.  I recommend follow-up with your primary care physician, neurologist as scheduled.

## 2020-09-18 DIAGNOSIS — I6389 Other cerebral infarction: Secondary | ICD-10-CM | POA: Diagnosis not present

## 2020-09-18 DIAGNOSIS — M6281 Muscle weakness (generalized): Secondary | ICD-10-CM | POA: Diagnosis not present

## 2020-09-18 DIAGNOSIS — R278 Other lack of coordination: Secondary | ICD-10-CM | POA: Diagnosis not present

## 2020-09-18 DIAGNOSIS — R262 Difficulty in walking, not elsewhere classified: Secondary | ICD-10-CM | POA: Diagnosis not present

## 2020-09-21 DIAGNOSIS — R262 Difficulty in walking, not elsewhere classified: Secondary | ICD-10-CM | POA: Diagnosis not present

## 2020-09-21 DIAGNOSIS — M6281 Muscle weakness (generalized): Secondary | ICD-10-CM | POA: Diagnosis not present

## 2020-09-21 DIAGNOSIS — R278 Other lack of coordination: Secondary | ICD-10-CM | POA: Diagnosis not present

## 2020-09-21 DIAGNOSIS — I6389 Other cerebral infarction: Secondary | ICD-10-CM | POA: Diagnosis not present

## 2020-09-22 ENCOUNTER — Other Ambulatory Visit: Payer: Self-pay

## 2020-09-22 ENCOUNTER — Encounter: Payer: PPO | Attending: Physical Medicine and Rehabilitation | Admitting: Physical Medicine and Rehabilitation

## 2020-09-22 ENCOUNTER — Encounter: Payer: Self-pay | Admitting: Physical Medicine and Rehabilitation

## 2020-09-22 VITALS — BP 118/71 | HR 74 | Temp 98.2°F | Ht 62.0 in | Wt 149.2 lb

## 2020-09-22 DIAGNOSIS — I63532 Cerebral infarction due to unspecified occlusion or stenosis of left posterior cerebral artery: Secondary | ICD-10-CM | POA: Diagnosis not present

## 2020-09-22 DIAGNOSIS — R202 Paresthesia of skin: Secondary | ICD-10-CM

## 2020-09-22 DIAGNOSIS — H8110 Benign paroxysmal vertigo, unspecified ear: Secondary | ICD-10-CM

## 2020-09-22 DIAGNOSIS — I4891 Unspecified atrial fibrillation: Secondary | ICD-10-CM

## 2020-09-22 MED ORDER — TICAGRELOR 90 MG PO TABS: 90.0000 mg | ORAL_TABLET | Freq: Two times a day (BID) | ORAL | 3 refills | Status: DC

## 2020-09-22 MED ORDER — AMLODIPINE BESYLATE 5 MG PO TABS: 5.0000 mg | ORAL_TABLET | Freq: Every day | ORAL | 11 refills | Status: DC

## 2020-09-22 NOTE — Progress Notes (Signed)
Subjective:    Patient ID: Deanna Williams, female    DOB: 1928-08-06, 87 y.o.   MRN: [id number]  HPI  Deanna Williams is an 87 year old woman who presents for transitional care s/p CVA  1) CVA: -has been receiving therapy at her facility and has a great therapist.  -No falls since discharge -had retinal hemorrhages with aspirin in the past -has been taking brillinta twice per day and   2) Vertigo -she has been doing exercises at home.   3) Paresthesias in right hand: -also has numbness and tingling in that hand.  -denies pain  4) Impaired mobility and ADLs: -ambulating within her home -her therapist felt she overdid ambulation initially -Lives in facility -daughter-in-law and son accompany her to appointment today  Denies pain   Pain Inventory Average Pain 0 Pain Right Now 0 My pain is intermittent and Pain over left eye. Numbness in right hand, right leg, left leg.  LOCATION OF PAIN  Left eye, right leg, right hand, left leg  BOWEL Number of stools per week: 7 Oral laxative use Yes  Type of laxative Colace Enema or suppository use No  History of colostomy No  Incontinent No   BLADDER Normal and Pads In and out cath, frequency N/A Able to self cath No  Bladder incontinence Yes  Frequent urination No  Leakage with coughing No  Difficulty starting stream No  Incomplete bladder emptying No    Mobility use a walker how many minutes can you walk? Unknown ability to climb steps?  yes do you drive?  no Do you have any goals in this area?  yes  Function retired I need assistance with the following:  shopping Do you have any goals in this area?  yes  Neuro/Psych numbness trouble walking  Prior Studies Any changes since last visit?  no New Patient  Physicians involved in your care Any changes since last visit?  no New Patient   Family History  Problem Relation Age of Onset  . Cancer Mother   . Leukemia Mother   . Cancer Father        colon   . Hypertension Father   . Arthritis Sister   . Breast cancer Neg Hx    Social History   Socioeconomic History  . Marital status: Widowed    Spouse name: Not on file  . Number of children: 1  . Years of education: Not on file  . Highest education level: 11th grade  Occupational History  . Not on file  Tobacco Use  . Smoking status: Never Smoker  . Smokeless tobacco: Never Used  Vaping Use  . Vaping Use: Never used  Substance and Sexual Activity  . Alcohol use: Yes    Alcohol/week: 0.0 standard drinks    Comment: rare- wine  . Drug use: No  . Sexual activity: Never  Other Topics Concern  . Not on file  Social History Narrative  . Not on file   Social Determinants of Health   Financial Resource Strain: Not on file  Food Insecurity: Not on file  Transportation Needs: Not on file  Physical Activity: Not on file  Stress: Not on file  Social Connections: Not on file   Past Surgical History:  Procedure Laterality Date  . CARPAL TUNNEL RELEASE    . FOOT SURGERY    . HYSTEROSCOPY WITH D & C N/A 11/02/2015   Procedure: DILATATION AND CURETTAGE /HYSTEROSCOPY;  Surgeon: Brayton Mars, MD;  Location: [facility];  Service: Gynecology;  Laterality: N/A;  . IR CT HEAD LTD  09/01/2020  . IR INTRA CRAN STENT  09/01/2020  . IR PERCUTANEOUS ART THROMBECTOMY/INFUSION INTRACRANIAL INC DIAG ANGIO  09/01/2020  . RADIOLOGY WITH ANESTHESIA N/A 09/01/2020   Procedure: IR WITH ANESTHESIA;  Surgeon: Radiologist, Medication, MD;  Location: [place];  Service: Radiology;  Laterality: N/A;   Past Medical History:  Diagnosis Date  . Gastroesophageal reflux disease   . Hyperlipidemia   . Hypertension   . Lichen sclerosus   . Lichen sclerosus et atrophicus   . Osteopenia   . Palpitations    There were no vitals taken for this visit.  Opioid Risk Score:   Fall Risk Score:  `1  Depression screen PHQ 2/9  Depression screen [facility] 2/9 07/20/2020 01/21/2020 08/02/2018 10/18/2017 09/05/2017 05/26/2016  04/21/2015  Decreased Interest 0 2 0 0 1 0 0  Down, Depressed, Hopeless 0 1 0 0 1 1 0  PHQ - 2 Score 0 3 0 0 2 1 0  Altered sleeping 2 2 - 3 0 - -  Tired, decreased energy 2 3 - 2 2 - -  Change in appetite 1 0 - 0 0 - -  Feeling bad or failure about yourself  0 0 - 0 1 - -  Trouble concentrating 0 1 - 0 0 - -  Moving slowly or fidgety/restless 0 0 - 0 0 - -  Suicidal thoughts 0 0 - 0 0 - -  PHQ-9 Score 5 9 - 5 5 - -  Difficult doing work/chores Not difficult at all Somewhat difficult - Not difficult at all Not difficult at all - -   Review of Systems  Cardiovascular: Positive for leg swelling.  Musculoskeletal: Positive for gait problem.  Neurological: Positive for numbness.  All other systems reviewed and are negative.      Objective:   Physical Exam Gen: no distress, normal appearing HEENT: oral mucosa pink and moist, NCAT Cardio: Reg rate Chest: normal effort, normal rate of breathing Abd: soft, non-distended Ext: no edema Psych: pleasant, normal affect Skin: intact Neuro: Alert and oriented x 3. Normal insight and awareness. Intact Memory. Normal language and speech. Cranial nerve exam unremarkable. RUE 4/5 prox to distal. RLE 4/5 prox to distal. LUE 4+/5. LLE 3+ to 4/5. Stocking glove sensory loss from knees to feet and bilateral hadns. DTR's 1+. No focal limb ataxia. No abnormal tone Musculoskeletal: Full ROM, No pain with AROM or PROM in the neck, trunk, or extremities. Posture appropriate     Assessment & Plan:  1) CVA -continue home therapy -refilled Brillinta. Educated regarding stopping 3 months post stroke on 12/05/20 -reviewed lipid panel- within normal limits- educated that she may stop Lipitor. -educated regarding foods that promote high HDL and low LDL/TG/VLDL  2) Atrial fibrillation: -educated that she will likely need to be on Eliquis for life -discussed risks and benefits of Eliquis. -educated regarding importance of cardiology follow-up and confirmed  that this has been scheduled  3) Vertigo: -discussed benefits of vestibular rehab, can consider in the future as mobility improves  4) Paresthesias: -discussed prognosis. Discussed RETURN TO DRIVING PLAN:  WITH THE SUPERVISION OF A LICENSED DRIVER, PLEASE DRIVE IN AN EMPTY PARKING LOT FOR AT LEAST 2-3 TRIALS TO TEST REACTION TIME, VISION, USE OF EQUIPMENT IN CAR, ETC.  IF SUCCESSFUL WITH THE PARKING LOT DRIVING, PROCEED TO SUPERVISED DRIVING TRIALS IN YOUR NEIGHBORHOOD STREETS AT LOW TRAFFIC TIMES TO TEST OBSERVATION TO TRAFFIC SIGNALS,  REACTION TIME, ETC. PLEASE ATTEMPT AT LEAST 2-3 TRIALS IN YOUR NEIGHBORHOOD.  IF NEIGHBORHOOD DRIVING IS SUCCESSFUL, YOU MAY PROCEED TO DRIVING IN BUSIER AREAS IN YOUR COMMUNITY WITH SUPERVISION OF A LICENSED DRIVER. PLEASE ATTEMPT AT LEAST 4-5 TRIALS.  5) HTN: -BP is 118/71 today.  -Advised checking BP daily at home and logging results to bring into follow-up appointment with her PCP and myself. -Reviewed BP meds today.  -may decrease amlodipine to 5mg  -Advised regarding healthy foods that can help lower blood pressure and provided with a list: 1) citrus foods- high in vitamins and minerals 2) salmon and other fatty fish - reduces inflammation and oxylipins 3) swiss chard (leafy green)- high level of nitrates 4) pumpkin seeds- one of the best natural sources of magnesium 5) Beans and lentils- high in fiber, magnesium, and potassium 6) Berries- high in flavonoids 7) Amaranth (whole grain, can be cooked similarly to rice and oats)- high in magnesium and fiber 8) Pistachios- even more effective at reducing BP than other nuts 9) Carrots- high in phenolic compounds that relax blood vessels and reduce inflammation 10) Celery- contain phthalides that relax tissues of arterial walls 11) Tomatoes- can also improve cholesterol and reduce risk of heart disease 12) Broccoli- good source of magnesium, calcium, and potassium 13) Greek yogurt: high in potassium and  calcium 14) Herbs and spices: Celery seed, cilantro, saffron, lemongrass, black cumin, ginseng, cinnamon, cardamom, sweet basil, and ginger 15) Chia and flax seeds- also help to lower cholesterol and blood sugar 16) Beets- high levels of nitrates that relax blood vessels  17) spinach and bananas- high in potassium -Educated that goal BP is 120/80. -Made goal to incorporate some of the above foods into diet.   Current impairments: -Impaired mobility requiring walker  The patient's medical and/or psychosocial problems require moderate decision-making during transitions in care from inpatient rehabilitation to home. This transitional care appointment included review of the patient's hospital discharge summary, review of the patient's hospital diagnostic tests and discussion of appropriate follow-up, education of the patient regarding their condition, re-establishment of necessary referrals. I will be reviewing patient's home and/or outpatient therapy notes as they progress through therapy and corresponding with therapists accordingly. I have encouraged compliance with current medication regimen (with adjustment to regimen as needed), follow-up with necessary providers, and the importance of following a healthy diet and exercise routine to maximize recovery, health, and quality of life.

## 2020-09-22 NOTE — Patient Instructions (Addendum)
Decrease Norvasc to 5mg  Stop Lipitor  Constipation:  -Provided list of following foods that help with constipation and highlighted a few: 1) prunes- contain high amounts of fiber.  2) apples- has a form of dietary fiber called pectin that accelerates stool movement and increases beneficial gut bacteria 3) pears- in addition to fiber, also high in fructose and sorbitol which have laxative effect 4) figs- contain an enzyme ficin which helps to speed colonic transit 5) kiwis- contain an enzyme actinidin that improves gut motility and reduces constipation 6) oranges- rich in pectin (like apples) 7) grapefruits- contain a flavanol naringenin which has a laxative effect 8) vegetables- rich in fiber and also great sources of folate, vitamin C, and K 9) artichoke- high in inulin, prebiotic great for the microbiome 10) chicory- increases stool frequency and softness (can be added to coffee) 11) rhubarb- laxative effect 12) sweet potato- high fiber 13) beans, peas, and lentils- contain both soluble and insoluble fiber 14) chia seeds- improves intestinal health and gut flora 15) flaxseeds- laxative effect 16) whole grain rye bread- high in fiber 17) oat bran- high in soluble and insoluble fiber 18) kefir- softens stools -recommended to try at least one of these foods every day.  -drink 6-8 glasses of water per day -walk regularly, especially after meals.    HTN: -Advised regarding healthy foods that can help lower blood pressure and provided with a list: 1) citrus foods- high in vitamins and minerals 2) salmon and other fatty fish - reduces inflammation and oxylipins 3) swiss chard (leafy green)- high level of nitrates 4) pumpkin seeds- one of the best natural sources of magnesium 5) Beans and lentils- high in fiber, magnesium, and potassium 6) Berries- high in flavonoids 7) Amaranth (whole grain, can be cooked similarly to rice and oats)- high in magnesium and fiber 8) Pistachios- even  more effective at reducing BP than other nuts 9) Carrots- high in phenolic compounds that relax blood vessels and reduce inflammation 10) Celery- contain phthalides that relax tissues of arterial walls 11) Tomatoes- can also improve cholesterol and reduce risk of heart disease 12) Broccoli- good source of magnesium, calcium, and potassium 13) Greek yogurt: high in potassium and calcium 14) Herbs and spices: Celery seed, cilantro, saffron, lemongrass, black cumin, ginseng, cinnamon, cardamom, sweet basil, and ginger 15) Chia and flax seeds- also help to lower cholesterol and blood sugar 16) Beets- high levels of nitrates that relax blood vessels  17) spinach and bananas- high in potassium -Educated that goal BP is 120/80. -Made goal to incorporate some of the above foods into diet.   RETURN TO DRIVING PLAN:  WITH THE SUPERVISION OF A LICENSED DRIVER, PLEASE DRIVE IN AN EMPTY PARKING LOT FOR AT LEAST 2-3 TRIALS TO TEST REACTION TIME, VISION, USE OF EQUIPMENT IN CAR, ETC.  IF SUCCESSFUL WITH THE PARKING LOT DRIVING, PROCEED TO SUPERVISED DRIVING TRIALS IN YOUR NEIGHBORHOOD STREETS AT LOW TRAFFIC TIMES TO TEST OBSERVATION TO TRAFFIC SIGNALS, REACTION TIME, ETC. PLEASE ATTEMPT AT LEAST 2-3 TRIALS IN YOUR NEIGHBORHOOD.  IF NEIGHBORHOOD DRIVING IS SUCCESSFUL, YOU MAY PROCEED TO DRIVING IN BUSIER AREAS IN YOUR COMMUNITY WITH SUPERVISION OF A LICENSED DRIVER. PLEASE ATTEMPT AT LEAST 4-5 TRIALS.

## 2020-09-23 ENCOUNTER — Encounter: Payer: PPO | Admitting: Physical Medicine and Rehabilitation

## 2020-09-23 DIAGNOSIS — R262 Difficulty in walking, not elsewhere classified: Secondary | ICD-10-CM | POA: Diagnosis not present

## 2020-09-23 DIAGNOSIS — M6281 Muscle weakness (generalized): Secondary | ICD-10-CM | POA: Diagnosis not present

## 2020-09-23 DIAGNOSIS — R278 Other lack of coordination: Secondary | ICD-10-CM | POA: Diagnosis not present

## 2020-09-23 DIAGNOSIS — I6389 Other cerebral infarction: Secondary | ICD-10-CM | POA: Diagnosis not present

## 2020-09-25 ENCOUNTER — Other Ambulatory Visit: Payer: Self-pay | Admitting: Family Medicine

## 2020-09-25 DIAGNOSIS — R278 Other lack of coordination: Secondary | ICD-10-CM | POA: Diagnosis not present

## 2020-09-25 DIAGNOSIS — R262 Difficulty in walking, not elsewhere classified: Secondary | ICD-10-CM | POA: Diagnosis not present

## 2020-09-25 DIAGNOSIS — I6389 Other cerebral infarction: Secondary | ICD-10-CM | POA: Diagnosis not present

## 2020-09-25 DIAGNOSIS — M6281 Muscle weakness (generalized): Secondary | ICD-10-CM | POA: Diagnosis not present

## 2020-09-25 DIAGNOSIS — R059 Cough, unspecified: Secondary | ICD-10-CM

## 2020-09-29 ENCOUNTER — Ambulatory Visit: Payer: PPO | Admitting: Cardiology

## 2020-09-29 ENCOUNTER — Encounter: Payer: Self-pay | Admitting: Cardiology

## 2020-09-29 ENCOUNTER — Other Ambulatory Visit: Payer: Self-pay

## 2020-09-29 VITALS — BP 144/68 | HR 68 | Ht 64.0 in | Wt 148.0 lb

## 2020-09-29 DIAGNOSIS — I480 Paroxysmal atrial fibrillation: Secondary | ICD-10-CM | POA: Diagnosis not present

## 2020-09-29 DIAGNOSIS — R278 Other lack of coordination: Secondary | ICD-10-CM | POA: Diagnosis not present

## 2020-09-29 DIAGNOSIS — R262 Difficulty in walking, not elsewhere classified: Secondary | ICD-10-CM | POA: Diagnosis not present

## 2020-09-29 DIAGNOSIS — M6281 Muscle weakness (generalized): Secondary | ICD-10-CM | POA: Diagnosis not present

## 2020-09-29 DIAGNOSIS — I6389 Other cerebral infarction: Secondary | ICD-10-CM | POA: Diagnosis not present

## 2020-09-29 NOTE — Progress Notes (Signed)
Electrophysiology Office Note   Date:  09/29/2020   ID:  Deanna Williams, DOB [birth year], MRN [id number]  PCP:  Jerrol Banana., MD  Cardiologist:   Primary Electrophysiologist:  Nicolaus Andel Meredith Leeds, MD    Chief Complaint: Cryptogenic stroke   History of Present Illness: Deanna Williams is a 87 y.o. female who is being seen today for the evaluation of cryptogenic stroke at the request of Jerrol Banana.,*. Presenting today for electrophysiology evaluation.  She has a history significant for hypertension, hyperlipidemia.  She presented to the hospital April 2022 with cryptogenic stroke.  While in the hospital, she was noted to have atrial fibrillation.  Today, she denies symptoms of palpitations, chest pain, shortness of breath, orthopnea, PND, lower extremity edema, claudication, dizziness, presyncope, syncope, bleeding, or neurologic sequela. The patient is tolerating medications without difficulties.    Past Medical History:  Diagnosis Date  . Gastroesophageal reflux disease   . Hyperlipidemia   . Hypertension   . Lichen sclerosus   . Lichen sclerosus et atrophicus   . Osteopenia   . Palpitations    Past Surgical History:  Procedure Laterality Date  . CARPAL TUNNEL RELEASE    . FOOT SURGERY    . HYSTEROSCOPY WITH D & C N/A 11/02/2015   Procedure: DILATATION AND CURETTAGE /HYSTEROSCOPY;  Surgeon: Brayton Mars, MD;  Location: [facility] ORS;  Service: Gynecology;  Laterality: N/A;  . IR CT HEAD LTD  09/01/2020  . IR INTRA CRAN STENT  09/01/2020  . IR PERCUTANEOUS ART THROMBECTOMY/INFUSION INTRACRANIAL INC DIAG ANGIO  09/01/2020  . RADIOLOGY WITH ANESTHESIA N/A 09/01/2020   Procedure: IR WITH ANESTHESIA;  Surgeon: Radiologist, Medication, MD;  Location: [place];  Service: Radiology;  Laterality: N/A;     Current Outpatient Medications  Medication Sig Dispense Refill  . acetaminophen (TYLENOL) 325 MG tablet Take 1-2 tablets (325-650 mg total) by mouth every 4  (four) hours as needed for mild pain.    Marland Kitchen amLODipine (NORVASC) 5 MG tablet Take 1 tablet (5 mg total) by mouth daily. 30 tablet 11  . apixaban (ELIQUIS) 5 MG TABS tablet Take 1 tablet (5 mg total) by mouth 2 (two) times daily. 60 tablet 0  . docusate sodium (COLACE) 100 MG capsule Take 1 capsule (100 mg total) by mouth 3 (three) times daily. 90 capsule 0  . ticagrelor (BRILINTA) 90 MG TABS tablet Take 1 tablet (90 mg total) by mouth 2 (two) times daily. 60 tablet 3   No current facility-administered medications for this visit.    Allergies:   Patient has no known allergies.   Social History:  The patient  reports that she has never smoked. She has never used smokeless tobacco. She reports current alcohol use. She reports that she does not use drugs.   Family History:  The patient's family history includes Arthritis in her sister; Cancer in her father and mother; Hypertension in her father; Leukemia in her mother.    ROS:  Please see the history of present illness.   Otherwise, review of systems is positive for none.   All other systems are reviewed and negative.    PHYSICAL EXAM: VS:  BP (!) 144/68   Pulse 68   Ht 5\' 4"  (1.626 m)   Wt 148 lb (67.1 kg)   SpO2 97%   BMI 25.40 kg/m  , BMI Body mass index is 25.4 kg/m. GEN: Well nourished, well developed, in no acute distress  HEENT: normal  Neck:  no JVD, carotid bruits, or masses Cardiac: RRR; no murmurs, rubs, or gallops,no edema  Respiratory:  clear to auscultation bilaterally, normal work of breathing GI: soft, nontender, nondistended, + BS MS: no deformity or atrophy  Skin: warm and dry Neuro:  Strength and sensation are intact Psych: euthymic mood, full affect  EKG:  EKG is not ordered today. Personal review of the ekg ordered 09/17/20 shows sinus rhythm, rate 72  Recent Labs: 01/21/2020: TSH 3.090 09/04/2020: Magnesium 2.1 09/17/2020: ALT 12; BUN 16; Creatinine, Ser 0.65; Hemoglobin 12.4; Platelets 263; Potassium 3.5; Sodium  137    Lipid Panel     Component Value Date/Time   CHOL 188 09/02/2020 0441   CHOL 205 (H) 01/21/2020 1041   TRIG 68 09/02/2020 0441   HDL 75 09/02/2020 0441   HDL 81 01/21/2020 1041   CHOLHDL 2.5 09/02/2020 0441   VLDL 14 09/02/2020 0441   LDLCALC 99 09/02/2020 0441   LDLCALC 110 (H) 01/21/2020 1041     Wt Readings from Last 3 Encounters:  09/29/20 148 lb (67.1 kg)  09/22/20 149 lb 3.2 oz (67.7 kg)  09/16/20 153 lb (69.4 kg)      Other studies Reviewed: Additional studies/ records that were reviewed today include: TTE 09/01/20  Review of the above records today demonstrates:  1. Left ventricular ejection fraction, by estimation, is 60 to 65%. The  left ventricle has normal function. The left ventricle has no regional  wall motion abnormalities. Left ventricular diastolic parameters are  consistent with Grade I diastolic  dysfunction (impaired relaxation).  2. Right ventricular systolic function is normal. The right ventricular  size is normal. There is mildly elevated pulmonary artery systolic  pressure.  3. The mitral valve is normal in structure. No evidence of mitral valve  regurgitation. No evidence of mitral stenosis.  4. The aortic valve is normal in structure. Aortic valve regurgitation is  not visualized. No aortic stenosis is present.  5. The inferior vena cava is normal in size with greater than 50%  respiratory variability, suggesting right atrial pressure of 3 mmHg.    ASSESSMENT AND PLAN:  1.  Cryptogenic stroke: Has numbness of her right side, though this is improving.  Plan per neurology.  2.  Paroxysmal atrial fibrillation: Currently on Eliquis.  She is also on Brilinta.  CHA2DS2-VASc of 4.  She is on Brilinta for stenting done by interventional neurology.  She has follow-up with them in a few weeks.  They Letcher Schweikert ask whether or not she needs to continue this.  Current medicines are reviewed at length with the patient today.   The patient does not  have concerns regarding her medicines.  The following changes were made today:  none  Labs/ tests ordered today include:  No orders of the defined types were placed in this encounter.    Disposition:   FU 6 months  Signed, Troyce Febo Meredith Leeds, MD  09/29/2020 2:59 PM     [place] [address] [place] [address] [phone] (office) [phone] (fax)

## 2020-10-02 DIAGNOSIS — R262 Difficulty in walking, not elsewhere classified: Secondary | ICD-10-CM | POA: Diagnosis not present

## 2020-10-02 DIAGNOSIS — I6389 Other cerebral infarction: Secondary | ICD-10-CM | POA: Diagnosis not present

## 2020-10-02 DIAGNOSIS — M6281 Muscle weakness (generalized): Secondary | ICD-10-CM | POA: Diagnosis not present

## 2020-10-02 DIAGNOSIS — R278 Other lack of coordination: Secondary | ICD-10-CM | POA: Diagnosis not present

## 2020-10-05 DIAGNOSIS — R262 Difficulty in walking, not elsewhere classified: Secondary | ICD-10-CM | POA: Diagnosis not present

## 2020-10-05 DIAGNOSIS — I6389 Other cerebral infarction: Secondary | ICD-10-CM | POA: Diagnosis not present

## 2020-10-05 DIAGNOSIS — R278 Other lack of coordination: Secondary | ICD-10-CM | POA: Diagnosis not present

## 2020-10-05 DIAGNOSIS — M6281 Muscle weakness (generalized): Secondary | ICD-10-CM | POA: Diagnosis not present

## 2020-10-08 DIAGNOSIS — M6281 Muscle weakness (generalized): Secondary | ICD-10-CM | POA: Diagnosis not present

## 2020-10-08 DIAGNOSIS — R262 Difficulty in walking, not elsewhere classified: Secondary | ICD-10-CM | POA: Diagnosis not present

## 2020-10-08 DIAGNOSIS — I6389 Other cerebral infarction: Secondary | ICD-10-CM | POA: Diagnosis not present

## 2020-10-08 DIAGNOSIS — R278 Other lack of coordination: Secondary | ICD-10-CM | POA: Diagnosis not present

## 2020-10-09 ENCOUNTER — Ambulatory Visit: Payer: PPO | Admitting: Cardiology

## 2020-10-12 DIAGNOSIS — I6389 Other cerebral infarction: Secondary | ICD-10-CM | POA: Diagnosis not present

## 2020-10-12 DIAGNOSIS — R278 Other lack of coordination: Secondary | ICD-10-CM | POA: Diagnosis not present

## 2020-10-12 DIAGNOSIS — M6281 Muscle weakness (generalized): Secondary | ICD-10-CM | POA: Diagnosis not present

## 2020-10-12 DIAGNOSIS — R262 Difficulty in walking, not elsewhere classified: Secondary | ICD-10-CM | POA: Diagnosis not present

## 2020-10-13 ENCOUNTER — Telehealth: Payer: Self-pay

## 2020-10-13 ENCOUNTER — Other Ambulatory Visit: Payer: Self-pay

## 2020-10-13 ENCOUNTER — Ambulatory Visit (HOSPITAL_COMMUNITY)
Admission: RE | Admit: 2020-10-13 | Discharge: 2020-10-13 | Disposition: A | Discharge status: Home / Self Care | Payer: PPO | Source: Ambulatory Visit | Attending: Physician Assistant | Admitting: Physician Assistant

## 2020-10-13 DIAGNOSIS — I63532 Cerebral infarction due to unspecified occlusion or stenosis of left posterior cerebral artery: Secondary | ICD-10-CM | POA: Diagnosis not present

## 2020-10-13 DIAGNOSIS — Z9889 Other specified postprocedural states: Secondary | ICD-10-CM | POA: Diagnosis not present

## 2020-10-13 NOTE — Telephone Encounter (Signed)
Neither letter or imaging is from [facility]. Will notify patient after 1:00 pm today.

## 2020-10-13 NOTE — Telephone Encounter (Signed)
Copied from [place] [id number]. Topic: General - Other >> Oct 13, 2020  9:40 AM Valere Dross wrote: Reason for CRM: Patient states she received a letter in the mail stating back to 09/01/2020 when she had some imaging done and due to her results the letter stated for her to contact the office. Patient also stated she would be available any time after 1 due to an appt scheduled today.

## 2020-10-14 HISTORY — PX: IR RADIOLOGIST EVAL & MGMT: IMG5224

## 2020-10-15 ENCOUNTER — Telehealth: Payer: Self-pay

## 2020-10-15 DIAGNOSIS — M6281 Muscle weakness (generalized): Secondary | ICD-10-CM | POA: Diagnosis not present

## 2020-10-15 DIAGNOSIS — R262 Difficulty in walking, not elsewhere classified: Secondary | ICD-10-CM | POA: Diagnosis not present

## 2020-10-15 DIAGNOSIS — I6389 Other cerebral infarction: Secondary | ICD-10-CM | POA: Diagnosis not present

## 2020-10-15 DIAGNOSIS — R278 Other lack of coordination: Secondary | ICD-10-CM | POA: Diagnosis not present

## 2020-10-15 NOTE — Telephone Encounter (Signed)
Please see letter in chart and advise. KW

## 2020-10-15 NOTE — Telephone Encounter (Signed)
This is on CXR from 4/22. Can be discussed at upcoming appt with Dr Darnell Level. We did not send the letter. They just want her to know to discuss it at next visit.

## 2020-10-15 NOTE — Telephone Encounter (Signed)
Copied from [place] [id number]. Topic: General - Other >> Oct 15, 2020 11:04 AM Tessa Lerner A wrote: Reason for CRM: Patient would like to be contacted regarding a recent letter received by [facility]   Patient would like to further discuss the "clinically significant incidental finding" discovered with imaging done on 09/01/20  Please contact to further advise when possible  A copy of the letter can be found in patient's chart dated 10/02/20

## 2020-10-15 NOTE — Telephone Encounter (Signed)
Daughter in-law advised.

## 2020-10-16 ENCOUNTER — Ambulatory Visit: Payer: Self-pay | Admitting: *Deleted

## 2020-10-16 ENCOUNTER — Encounter: Payer: Self-pay | Admitting: Emergency Medicine

## 2020-10-16 ENCOUNTER — Ambulatory Visit
Admission: EM | Admit: 2020-10-16 | Discharge: 2020-10-16 | Disposition: A | Discharge status: Home / Self Care | Payer: PPO | Attending: Emergency Medicine | Admitting: Emergency Medicine

## 2020-10-16 ENCOUNTER — Other Ambulatory Visit: Payer: Self-pay

## 2020-10-16 DIAGNOSIS — R300 Dysuria: Secondary | ICD-10-CM | POA: Diagnosis not present

## 2020-10-16 LAB — POCT URINALYSIS DIP (MANUAL ENTRY)
Bilirubin, UA: NEGATIVE
Glucose, UA: NEGATIVE mg/dL
Ketones, POC UA: NEGATIVE mg/dL
Nitrite, UA: NEGATIVE
Protein Ur, POC: NEGATIVE mg/dL
Spec Grav, UA: 1.025 (ref 1.010–1.025)
Urobilinogen, UA: 0.2 E.U./dL
pH, UA: 5.5 (ref 5.0–8.0)

## 2020-10-16 MED ORDER — CEPHALEXIN 500 MG PO CAPS: 500.0000 mg | ORAL_CAPSULE | Freq: Two times a day (BID) | ORAL | 0 refills | Status: AC

## 2020-10-16 NOTE — ED Provider Notes (Signed)
Roderic Palau    CSN: [id number] Arrival date & time: 10/16/20  1048      History   Chief Complaint Chief Complaint  Patient presents with  . Dysuria  . Urinary Frequency    HPI Deanna Williams is a 87 y.o. female.   Patient presents with 1 week history of dysuria and urinary frequency.  She denies fever, chills, abdominal pain, flank pain, or other symptoms.  No treatments attempted at home.  Her medical history includes prediabetes, stroke, atrial fibrillation, hypertension.  The history is provided by the patient and a relative.    Past Medical History:  Diagnosis Date  . Gastroesophageal reflux disease   . Hyperlipidemia   . Hypertension   . Lichen sclerosus   . Lichen sclerosus et atrophicus   . Osteopenia   . Palpitations     Patient Active Problem List   Diagnosis Date Noted  . S/P angioplasty with PCA stent 09/14/2020  . Atrial fibrillation ([place]) 09/05/2020  . Acute ischemic left PCA stroke ([place]) 09/01/2020  . Stroke (cerebrum) ([place]) 09/01/2020  . Acute cerebrovascular accident (CVA) due to occlusion of right posterior cerebral artery ([place]) 09/01/2020  . Stroke ([place]) 09/01/2020  . Hyperglycemia 08/31/2016  . Endometrial polyp 11/11/2015  . Lichen sclerosus 24/23/5361  . Osteoporosis 08/11/2015  . Allergic rhinitis 04/14/2015  . Blood pressure elevated 04/14/2015  . 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 Acid reflux 04/14/2015  . Prediabetes 04/14/2015  . Hypercholesterolemia 04/14/2015  . BP (high blood pressure) 04/14/2015  . Arthritis, degenerative 04/14/2015  . Osteopenia 04/14/2015  . Awareness of heartbeats 04/14/2015    Past Surgical History:  Procedure Laterality Date  . CARPAL TUNNEL RELEASE    . FOOT SURGERY    . HYSTEROSCOPY WITH D & C N/A 11/02/2015   Procedure: DILATATION AND CURETTAGE /HYSTEROSCOPY;  Surgeon: Brayton Mars, MD;  Location: [facility];  Service: Gynecology;  Laterality: N/A;  . IR CT HEAD LTD  09/01/2020  . IR  INTRA CRAN STENT  09/01/2020  . IR PERCUTANEOUS ART THROMBECTOMY/INFUSION INTRACRANIAL INC DIAG ANGIO  09/01/2020  . IR RADIOLOGIST EVAL & MGMT  10/14/2020  . RADIOLOGY WITH ANESTHESIA N/A 09/01/2020   Procedure: IR WITH ANESTHESIA;  Surgeon: Radiologist, Medication, MD;  Location: [facility];  Service: Radiology;  Laterality: N/A;    OB History   No obstetric history on file.      Home Medications    Prior to Admission medications   Medication Sig Start Date End Date Taking? Authorizing Provider  cephALEXin (KEFLEX) 500 MG capsule Take 1 capsule (500 mg total) by mouth 2 (two) times daily for 5 days. 10/16/20 10/21/20 Yes Sharion Balloon, NP  acetaminophen (TYLENOL) 325 MG tablet Take 1-2 tablets (325-650 mg total) by mouth every 4 (four) hours as needed for mild pain. 09/09/20   Love, Ivan Anchors, PA-C  amLODipine (NORVASC) 5 MG tablet Take 1 tablet (5 mg total) by mouth daily. 09/22/20 09/22/21  Raulkar, Clide Deutscher, MD  apixaban (ELIQUIS) 5 MG TABS tablet Take 1 tablet (5 mg total) by mouth 2 (two) times daily. 09/11/20   Love, Ivan Anchors, PA-C  docusate sodium (COLACE) 100 MG capsule Take 1 capsule (100 mg total) by mouth 3 (three) times daily. 09/11/20   Love, Ivan Anchors, PA-C  ticagrelor (BRILINTA) 90 MG TABS tablet Take 1 tablet (90 mg total) by mouth 2 (two) times daily. 09/22/20   Raulkar, Clide Deutscher, MD    Family History Family History  Problem Relation Age of Onset  . Cancer Mother   . Leukemia Mother   . Cancer Father        colon  . Hypertension Father   . Arthritis Sister   . Breast cancer Neg Hx     Social History Social History   Tobacco Use  . Smoking status: Never Smoker  . Smokeless tobacco: Never Used  Vaping Use  . Vaping Use: Never used  Substance Use Topics  . Alcohol use: Yes    Alcohol/week: 0.0 standard drinks    Comment: rare- wine  . Drug use: No     Allergies   Patient has no known allergies.   Review of Systems Review of Systems  Constitutional: Negative for  chills and fever.  Respiratory: Negative for cough and shortness of breath.   Cardiovascular: Negative for chest pain and palpitations.  Gastrointestinal: Negative for abdominal pain and vomiting.  Genitourinary: Positive for dysuria and frequency. Negative for flank pain and hematuria.  Skin: Negative for color change and rash.  All other systems reviewed and are negative.    Physical Exam Triage Vital Signs ED Triage Vitals  Enc Vitals Group     BP      Pulse      Resp      Temp      Temp src      SpO2      Weight      Height      Head Circumference      Peak Flow      Pain Score      Pain Loc      Pain Edu?      Excl. in [place]?    No data found.  Updated Vital Signs BP (!) 155/85 (BP Location: Right Arm)   Pulse 81   Temp 98.6 F (37 C) (Oral)   Resp 16   SpO2 96%   Visual Acuity Right Eye Distance:   Left Eye Distance:   Bilateral Distance:    Right Eye Near:   Left Eye Near:    Bilateral Near:     Physical Exam Vitals and nursing note reviewed.  Constitutional:      General: She is not in acute distress.    Appearance: She is well-developed. She is not ill-appearing.  HENT:     Head: Normocephalic and atraumatic.     Mouth/Throat:     Mouth: Mucous membranes are moist.  Eyes:     Conjunctiva/sclera: Conjunctivae normal.  Cardiovascular:     Rate and Rhythm: Normal rate and regular rhythm.     Heart sounds: Normal heart sounds.  Pulmonary:     Effort: Pulmonary effort is normal. No respiratory distress.     Breath sounds: Normal breath sounds.  Abdominal:     General: Bowel sounds are normal.     Palpations: Abdomen is soft.     Tenderness: There is no abdominal tenderness. There is no right CVA tenderness, left CVA tenderness, guarding or rebound.  Musculoskeletal:     Cervical back: Neck supple.  Skin:    General: Skin is warm and dry.  Neurological:     Mental Status: She is alert. Mental status is at baseline.  Psychiatric:        Mood  and Affect: Mood normal.        Behavior: Behavior normal.      UC Treatments / Results  Labs (all labs ordered are listed, but only abnormal results are  displayed) Labs Reviewed  POCT URINALYSIS DIP (MANUAL ENTRY) - Abnormal; Notable for the following components:      Result Value   Blood, UA moderate (*)    Leukocytes, UA Large (3+) (*)    All other components within normal limits    EKG   Radiology No results found.  Procedures Procedures (including critical care time)  Medications Ordered in UC Medications - No data to display  Initial Impression / Assessment and Plan / UC Course  I have reviewed the triage vital signs and the nursing notes.  Pertinent labs & imaging results that were available during my care of the patient were reviewed by me and considered in my medical decision making (see chart for details).   Dysuria.  Urine culture pending.  Treating with Keflex.  Discussed with patient that we will call her if the culture shows need to change or discontinue the antibiotic.  Instructed her to follow-up with her PCP if her symptoms are not improving.  She agrees to plan of care.   Final Clinical Impressions(s) / UC Diagnoses   Final diagnoses:  Dysuria     Discharge Instructions     Take the antibiotic as directed.  The urine culture is pending.  We will call you if it shows the need to change or discontinue your antibiotic.    Follow up with your primary care provider if your symptoms are not improving.        ED Prescriptions    Medication Sig Dispense Auth. Provider   cephALEXin (KEFLEX) 500 MG capsule Take 1 capsule (500 mg total) by mouth 2 (two) times daily for 5 days. 10 capsule Sharion Balloon, NP     PDMP not reviewed this encounter.   Sharion Balloon, NP 10/16/20 1245

## 2020-10-16 NOTE — Telephone Encounter (Signed)
Chart indicates NP called in a Keflex prescription for infection. Should try to get in to one of the Urgent Care Clinics if confusion worsening or fever develops.

## 2020-10-16 NOTE — Telephone Encounter (Signed)
Patients daughter states that patient was seen at [facility] urgent care and was evaluated at 10:30AM, patient tolerating medication well and daughter will monitor. KW

## 2020-10-16 NOTE — ED Triage Notes (Signed)
Patient c/o dysuria and increased urinary frequency x 1 week.   Patient denies N/V or headache. Patient denies any other symptoms.   Patient hasn't used any medications for symptoms.

## 2020-10-16 NOTE — Telephone Encounter (Signed)
Summary: please advise   Pt daughter Olin Hauser states her mother get confusing and would like callback instead of her mother. Pt has been urinating a lot and yesterday starting have burning when urinating. Please advise     Call to daughter- patient is having painful urination, frequency. Call to office- no appointment availiable but they are going to reach out to [facility] and see if they can help. Call transferred.  Reason for Disposition . Urinating more frequently than usual (i.e., frequency)  Answer Assessment - Initial Assessment Questions 1. SYMPTOM: "What's the main symptom you're concerned about?" (e.g., frequency, incontinence)     Burning with urination 2. ONSET: "When did the  symptoms  start?"     3-4 days 3. PAIN: "Is there any pain?" If Yes, ask: "How bad is it?" (Scale: 1-10; mild, moderate, severe)     Yes- severe 4. CAUSE: "What do you think is causing the symptoms?"     Possible UTI 5. OTHER SYMPTOMS: "Do you have any other symptoms?" (e.g., fever, flank pain, blood in urine, pain with urination)     frequency 6. PREGNANCY: "Is there any chance you are pregnant?" "When was your last menstrual period?"     n/a  Protocols used: URINARY [facility]

## 2020-10-16 NOTE — Discharge Instructions (Addendum)
Take the antibiotic as directed.  The urine culture is pending.  We will call you if it shows the need to change or discontinue your antibiotic.    Follow up with your primary care provider if your symptoms are not improving.    

## 2020-10-20 ENCOUNTER — Ambulatory Visit: Payer: Self-pay

## 2020-10-20 DIAGNOSIS — R278 Other lack of coordination: Secondary | ICD-10-CM | POA: Diagnosis not present

## 2020-10-20 DIAGNOSIS — M6281 Muscle weakness (generalized): Secondary | ICD-10-CM | POA: Diagnosis not present

## 2020-10-20 DIAGNOSIS — R262 Difficulty in walking, not elsewhere classified: Secondary | ICD-10-CM | POA: Diagnosis not present

## 2020-10-20 DIAGNOSIS — I6389 Other cerebral infarction: Secondary | ICD-10-CM | POA: Diagnosis not present

## 2020-10-20 NOTE — Telephone Encounter (Signed)
Made an appt for 10/22/20 at 2:00 p.m.

## 2020-10-20 NOTE — Telephone Encounter (Signed)
You can put her in at Thursday at 2 but as the only slot I have this whole week

## 2020-10-20 NOTE — Telephone Encounter (Signed)
Patient has appt 10/22/2020.

## 2020-10-20 NOTE — Telephone Encounter (Signed)
Pt.'s daughter reports pt. Started having dizziness Sunday. Has had this in the past and takes Dramamine. This is working as "good as it usually does." Is up moving around , but gets dizzy. No other symptoms. Daughter would like to be worked in with Dr. Rosanna Randy Thursday afternoon or anytime on Friday.Please advise daughter.  Reason for Disposition . [1] MODERATE dizziness (e.g., interferes with normal activities) AND [2] has been evaluated by physician for this  Answer Assessment - Initial Assessment Questions 1. DESCRIPTION: "Describe your dizziness."     Lightheadedness 2. LIGHTHEADED: "Do you feel lightheaded?" (e.g., somewhat faint, woozy, weak upon standing)     Yes 3. VERTIGO: "Do you feel like either you or the room is spinning or tilting?" (i.e. vertigo)     No 4. SEVERITY: "How bad is it?"  "Do you feel like you are going to faint?" "Can you stand and walk?"   - MILD: Feels slightly dizzy, but walking normally.   - MODERATE: Feels unsteady when walking, but not falling; interferes with normal activities (e.g., school, work).   - SEVERE: Unable to walk without falling, or requires assistance to walk without falling; feels like passing out now.      Mild 5. ONSET:  "When did the dizziness begin?"     Sunday 6. AGGRAVATING FACTORS: "Does anything make it worse?" (e.g., standing, change in head position)     Movining 7. HEART RATE: "Can you tell me your heart rate?" "How many beats in 15 seconds?"  (Note: not all patients can do this)       No 8. CAUSE: "What do you think is causing the dizziness?"     Unsure 9. RECURRENT SYMPTOM: "Have you had dizziness before?" If Yes, ask: "When was the last time?" "What happened that time?"     Yes 10. OTHER SYMPTOMS: "Do you have any other symptoms?" (e.g., fever, chest pain, vomiting, diarrhea, bleeding)       No 11. PREGNANCY: "Is there any chance you are pregnant?" "When was your last menstrual period?"       No  Protocols used: DIZZINESS  [facility]

## 2020-10-22 ENCOUNTER — Other Ambulatory Visit: Payer: Self-pay

## 2020-10-22 ENCOUNTER — Encounter: Payer: Self-pay | Admitting: Family Medicine

## 2020-10-22 ENCOUNTER — Ambulatory Visit
Admission: RE | Admit: 2020-10-22 | Discharge: 2020-10-22 | Disposition: A | Discharge status: Home / Self Care | Payer: PPO | Source: Ambulatory Visit | Attending: Family Medicine | Admitting: Family Medicine

## 2020-10-22 ENCOUNTER — Ambulatory Visit
Admission: RE | Admit: 2020-10-22 | Discharge: 2020-10-22 | Disposition: A | Discharge status: Home / Self Care | Payer: PPO | Attending: Family Medicine | Admitting: Family Medicine

## 2020-10-22 ENCOUNTER — Ambulatory Visit (INDEPENDENT_AMBULATORY_CARE_PROVIDER_SITE_OTHER): Payer: PPO | Admitting: Family Medicine

## 2020-10-22 VITALS — BP 109/67 | HR 67 | Temp 98.1°F | Resp 18 | Wt 150.0 lb

## 2020-10-22 DIAGNOSIS — K449 Diaphragmatic hernia without obstruction or gangrene: Secondary | ICD-10-CM | POA: Diagnosis not present

## 2020-10-22 DIAGNOSIS — I4891 Unspecified atrial fibrillation: Secondary | ICD-10-CM

## 2020-10-22 DIAGNOSIS — R278 Other lack of coordination: Secondary | ICD-10-CM | POA: Diagnosis not present

## 2020-10-22 DIAGNOSIS — R42 Dizziness and giddiness: Secondary | ICD-10-CM

## 2020-10-22 DIAGNOSIS — J9811 Atelectasis: Secondary | ICD-10-CM | POA: Diagnosis not present

## 2020-10-22 DIAGNOSIS — J189 Pneumonia, unspecified organism: Secondary | ICD-10-CM | POA: Insufficient documentation

## 2020-10-22 DIAGNOSIS — M6281 Muscle weakness (generalized): Secondary | ICD-10-CM | POA: Diagnosis not present

## 2020-10-22 DIAGNOSIS — I6389 Other cerebral infarction: Secondary | ICD-10-CM | POA: Diagnosis not present

## 2020-10-22 DIAGNOSIS — I10 Essential (primary) hypertension: Secondary | ICD-10-CM | POA: Diagnosis not present

## 2020-10-22 DIAGNOSIS — R262 Difficulty in walking, not elsewhere classified: Secondary | ICD-10-CM | POA: Diagnosis not present

## 2020-10-22 DIAGNOSIS — R739 Hyperglycemia, unspecified: Secondary | ICD-10-CM

## 2020-10-22 MED ORDER — AMLODIPINE BESYLATE 2.5 MG PO TABS: 2.5000 mg | ORAL_TABLET | Freq: Every day | ORAL | 1 refills | Status: DC

## 2020-10-22 NOTE — Patient Instructions (Signed)
Stay Hydrated.

## 2020-10-22 NOTE — Progress Notes (Signed)
I,Deanna Williams,acting as a scribe for [name], MD.,have documented all relevant documentation on the behalf of [name], MD,as directed by  [name], MD while in the presence of [name], MD.  Established patient visit   Patient: Deanna Williams   DOB: 04-22-1929   87 y.o. Female  MRN: [id number] Visit Date: 10/22/2020  Today's healthcare provider: [name], MD   Chief Complaint  Patient presents with   Dizziness   Subjective    Dizziness This is a new problem. The current episode started in the past 7 days (4 days ago). Episode frequency: everything she lays down and gets back up. Associated symptoms include coughing, fatigue, numbness, vertigo and a visual change. Pertinent negatives include no abdominal pain, anorexia, arthralgias, change in bowel habit, chest pain, chills, congestion, diaphoresis, fever, headaches, joint swelling, myalgias, nausea, neck pain, rash, sore throat, swollen glands, urinary symptoms, vomiting or weakness. The symptoms are aggravated by standing and twisting. Treatments tried: dramamine. The treatment provided mild relief.   Patient states she has had vertigo for 4 days. Symptoms include dizziness and blurring vision. Patient states dizziness usually occurs when she gets up from a supine position. Also has dizziness with different movements of her head. Patient has been taking dramamine with only mild relief. Most of the symptoms she is having started after her stroke a month ago.  Mainly is that when she first stands up per moved to a more upright position she is lightheaded for several minutes. No  Slurred speech or other neurologic symptoms.  No focal deficits.  No chest pains or shortness of shortness of breath.  No syncope.     Medications: Outpatient Medications Prior to Visit  Medication Sig   apixaban (ELIQUIS) 5 MG TABS tablet Take 1 tablet (5 mg total) by mouth 2 (two) times daily.    ticagrelor (BRILINTA) 90 MG TABS tablet Take 1 tablet (90 mg total) by mouth 2 (two) times daily.   [DISCONTINUED] amLODipine (NORVASC) 5 MG tablet Take 1 tablet (5 mg total) by mouth daily.   acetaminophen (TYLENOL) 325 MG tablet Take 1-2 tablets (325-650 mg total) by mouth every 4 (four) hours as needed for mild pain. (Patient not taking: Reported on 10/22/2020)   docusate sodium (COLACE) 100 MG capsule Take 1 capsule (100 mg total) by mouth 3 (three) times daily. (Patient not taking: Reported on 10/22/2020)   No facility-administered medications prior to visit.    Review of Systems  Constitutional:  Positive for fatigue. Negative for appetite change, chills, diaphoresis and fever.  HENT:  Negative for congestion and sore throat.   Respiratory:  Positive for cough. Negative for chest tightness and shortness of breath.   Cardiovascular:  Negative for chest pain and palpitations.  Gastrointestinal:  Negative for abdominal pain, anorexia, change in bowel habit, nausea and vomiting.  Musculoskeletal:  Negative for arthralgias, joint swelling, myalgias and neck pain.  Skin:  Negative for rash.  Neurological:  Positive for dizziness, vertigo and numbness. Negative for weakness and headaches.       Objective    BP 109/67 (BP Location: Right Arm, Patient Position: Sitting, Cuff Size: Normal)   Pulse 67   Temp 98.1 F (36.7 C) (Oral)   Resp 18   Wt 150 lb (68 kg)   SpO2 94%   BMI 25.75 kg/m  BP Readings from Last 3 Encounters:  10/22/20 109/67  10/16/20 (!) 155/85  09/29/20 (!) 144/68  Wt Readings from Last 3 Encounters:  10/22/20 150 lb (68 kg)  09/29/20 148 lb (67.1 kg)  09/22/20 149 lb 3.2 oz (67.7 kg)       Physical Exam Vitals reviewed.  Constitutional:      Appearance: She is well-developed.  HENT:     Head: Normocephalic and atraumatic.     Right Ear: External ear normal.     Left Ear: External ear normal.     Nose: Nose normal.  Eyes:     General: No scleral  icterus.    Conjunctiva/sclera: Conjunctivae normal.     Pupils: Pupils are equal, round, and reactive to light.  Neck:     Thyroid: No thyromegaly.  Cardiovascular:     Rate and Rhythm: Normal rate and regular rhythm.     Heart sounds: Normal heart sounds.  Pulmonary:     Effort: Pulmonary effort is normal.     Breath sounds: Normal breath sounds.  Abdominal:     Palpations: Abdomen is soft.     Tenderness: There is no abdominal tenderness.  Lymphadenopathy:     Cervical: No cervical adenopathy.  Skin:    General: Skin is warm and dry.  Neurological:     General: No focal deficit present.     Mental Status: She is alert and oriented to person, place, and time.     Comments: Mild right sided weakness particularly in the upper arm.  Psychiatric:        Mood and Affect: Mood normal.        Behavior: Behavior normal.        Thought Content: Thought content normal.        Judgment: Judgment normal.      No results found for any visits on 10/22/20.  Assessment & Plan     I dizziness The patient is mildly orthostatic low think most of the symptoms are from her small posterior stroke 6 weeks ago.  At this time will decrease amlodipine 5 mg to 2.5 mg daily.  He is advised strongly to stay hydrated and to move a little more slowly when transitioning.  - EKG 12-Lead - TSH - Hemoglobin A1c - Vitamin B12 - CBC w/Diff/Platelet  2. Pneumonia due to infectious organism, unspecified laterality, unspecified part of lung Clinically improved.  Follow-up chest x-ray - DG Chest 2 View  3. Primary hypertension Decrease amlodipine from 5 to 2.5 mg daily - TSH - Hemoglobin A1c - Vitamin B12 - CBC w/Diff/Platelet - amLODipine (NORVASC) 2.5 MG tablet; Take 1 tablet (2.5 mg total) by mouth daily.  Dispense: 90 tablet; Refill: 1  4. Hyperglycemia  - TSH - Hemoglobin A1c - Vitamin B12 - CBC w/Diff/Platelet  5. Atrial fibrillation, unspecified type (HCC)I,  On Xarelto full dose and  Brilinta. - TSH - Hemoglobin A1c - Vitamin B12 - CBC w/Diff/Platelet   Return in about 6 weeks (around 12/03/2020).      I, [name], MD, have reviewed all documentation for this visit. The documentation on 10/25/20 for the exam, diagnosis, procedures, and orders are all accurate and complete.    Makinzee Durley Cranford Mon, MD  [facility] [phone] (phone) [phone] (fax)  [place]

## 2020-10-23 LAB — CBC WITH DIFFERENTIAL/PLATELET
Basophils Absolute: 0 10*3/uL (ref 0.0–0.2)
Basos: 1 %
EOS (ABSOLUTE): 0.1 10*3/uL (ref 0.0–0.4)
Eos: 2 %
Hematocrit: 42.2 % (ref 34.0–46.6)
Hemoglobin: 14.2 g/dL (ref 11.1–15.9)
Immature Grans (Abs): 0 10*3/uL (ref 0.0–0.1)
Immature Granulocytes: 0 %
Lymphocytes Absolute: 2 10*3/uL (ref 0.7–3.1)
Lymphs: 32 %
MCH: 31.2 pg (ref 26.6–33.0)
MCHC: 33.6 g/dL (ref 31.5–35.7)
MCV: 93 fL (ref 79–97)
Monocytes Absolute: 0.5 10*3/uL (ref 0.1–0.9)
Monocytes: 8 %
Neutrophils Absolute: 3.6 10*3/uL (ref 1.4–7.0)
Neutrophils: 57 %
Platelets: 280 10*3/uL (ref 150–450)
RBC: 4.55 x10E6/uL (ref 3.77–5.28)
RDW: 12.6 % (ref 11.7–15.4)
WBC: 6.3 10*3/uL (ref 3.4–10.8)

## 2020-10-23 LAB — HEMOGLOBIN A1C
Est. average glucose Bld gHb Est-mCnc: 126 mg/dL
Hgb A1c MFr Bld: 6 % — ABNORMAL HIGH (ref 4.8–5.6)

## 2020-10-23 LAB — VITAMIN B12: Vitamin B-12: 512 pg/mL (ref 232–1245)

## 2020-10-23 LAB — TSH: TSH: 2.79 u[IU]/mL (ref 0.450–4.500)

## 2020-10-27 DIAGNOSIS — I6389 Other cerebral infarction: Secondary | ICD-10-CM | POA: Diagnosis not present

## 2020-10-27 DIAGNOSIS — R262 Difficulty in walking, not elsewhere classified: Secondary | ICD-10-CM | POA: Diagnosis not present

## 2020-10-27 DIAGNOSIS — R278 Other lack of coordination: Secondary | ICD-10-CM | POA: Diagnosis not present

## 2020-10-27 DIAGNOSIS — M6281 Muscle weakness (generalized): Secondary | ICD-10-CM | POA: Diagnosis not present

## 2020-10-29 DIAGNOSIS — M6281 Muscle weakness (generalized): Secondary | ICD-10-CM | POA: Diagnosis not present

## 2020-10-29 DIAGNOSIS — R278 Other lack of coordination: Secondary | ICD-10-CM | POA: Diagnosis not present

## 2020-10-29 DIAGNOSIS — I6389 Other cerebral infarction: Secondary | ICD-10-CM | POA: Diagnosis not present

## 2020-10-29 DIAGNOSIS — R262 Difficulty in walking, not elsewhere classified: Secondary | ICD-10-CM | POA: Diagnosis not present

## 2020-10-30 ENCOUNTER — Other Ambulatory Visit: Payer: Self-pay | Admitting: Physical Medicine and Rehabilitation

## 2020-11-02 ENCOUNTER — Ambulatory Visit: Payer: Self-pay | Admitting: Family Medicine

## 2020-11-04 DIAGNOSIS — M6281 Muscle weakness (generalized): Secondary | ICD-10-CM | POA: Diagnosis not present

## 2020-11-04 DIAGNOSIS — I6389 Other cerebral infarction: Secondary | ICD-10-CM | POA: Diagnosis not present

## 2020-11-04 DIAGNOSIS — R278 Other lack of coordination: Secondary | ICD-10-CM | POA: Diagnosis not present

## 2020-11-04 DIAGNOSIS — R262 Difficulty in walking, not elsewhere classified: Secondary | ICD-10-CM | POA: Diagnosis not present

## 2020-11-08 ENCOUNTER — Other Ambulatory Visit: Payer: Self-pay | Admitting: Physical Medicine and Rehabilitation

## 2020-11-09 ENCOUNTER — Other Ambulatory Visit: Payer: Self-pay | Admitting: Physical Medicine and Rehabilitation

## 2020-11-10 ENCOUNTER — Ambulatory Visit: Payer: Self-pay | Admitting: *Deleted

## 2020-11-10 DIAGNOSIS — I6389 Other cerebral infarction: Secondary | ICD-10-CM | POA: Diagnosis not present

## 2020-11-10 DIAGNOSIS — R262 Difficulty in walking, not elsewhere classified: Secondary | ICD-10-CM | POA: Diagnosis not present

## 2020-11-10 DIAGNOSIS — R278 Other lack of coordination: Secondary | ICD-10-CM | POA: Diagnosis not present

## 2020-11-10 DIAGNOSIS — M6281 Muscle weakness (generalized): Secondary | ICD-10-CM | POA: Diagnosis not present

## 2020-11-10 NOTE — Telephone Encounter (Signed)
Patient called in about botton number on BP fluctuating, Please call back to discuss.   Patient has PT therapy and they check her BP- they stated her bottom number is fluctuating. They have advised she check in with PCP. Patient state she is not feeling good recently- she has had increase in swelling in left leg( since stroke)-her right leg swells- but that is normal for her. Patient does not have her readings-she states PT sent them. Patient states the PT therapist sent the report- Eden Emms.  Would like to be seen before her upcoming appointment- patient advised will send message for office to review and see if an appointment can be scheduled- may have to see another provider. Reason for Disposition  [1] Caller requesting NON-URGENT health information AND [2] PCP's office is the best resource  Answer Assessment - Initial Assessment Questions 1. REASON FOR CALL or QUESTION: "What is your reason for calling today?" or "How can I best help you?" or "What question do you have that I can help answer?"     Patient is calling to verify office has received BP reading from PT- they have recommended she see PCP about her reading- patient does not have that information.  Protocols used: Information Only Call - No Triage-A-AH

## 2020-11-11 NOTE — Telephone Encounter (Signed)
Spoke with patient's daughter and she reports that the patient is feeling better. She will continue to monitor BP and symptoms, and call back if it worsens.

## 2020-11-12 ENCOUNTER — Other Ambulatory Visit: Payer: Self-pay | Admitting: Family Medicine

## 2020-11-12 DIAGNOSIS — I6389 Other cerebral infarction: Secondary | ICD-10-CM | POA: Diagnosis not present

## 2020-11-12 DIAGNOSIS — R278 Other lack of coordination: Secondary | ICD-10-CM | POA: Diagnosis not present

## 2020-11-12 DIAGNOSIS — R262 Difficulty in walking, not elsewhere classified: Secondary | ICD-10-CM | POA: Diagnosis not present

## 2020-11-12 DIAGNOSIS — R059 Cough, unspecified: Secondary | ICD-10-CM

## 2020-11-12 DIAGNOSIS — M6281 Muscle weakness (generalized): Secondary | ICD-10-CM | POA: Diagnosis not present

## 2020-11-12 NOTE — Telephone Encounter (Signed)
Requested medication (s) are due for refill today: Yes  Requested medication (s) are on the active medication list: No  Last refill:  Unknown  Future visit scheduled: Yes  Notes to clinic:  Unable to refill per protocol, discontinued at hospital discharge, unsure if provider wants to renew.     Requested Prescriptions  Pending Prescriptions Disp Refills   omeprazole (PRILOSEC) 20 MG capsule [Pharmacy Med Name: OMEPRAZOLE DR 20 MG CAPSULE] 90 capsule 1    Sig: TAKE 1 CAPSULE BY MOUTH EVERY DAY      Gastroenterology: Proton Pump Inhibitors Passed - 11/12/2020  4:32 PM      Passed - Valid encounter within last 12 months    Recent Outpatient Visits           3 weeks ago Dizziness   [facility] Jerrol Banana., MD   1 month ago Acute ischemic left PCA stroke [facility])   [facility] Jerrol Banana., MD   3 months ago Hyperglycemia   [facility] Jerrol Banana., MD   4 months ago Atrophic urethritis   [facility] Jerrol Banana., MD   9 months ago Encounter for subsequent annual wellness visit (AWV) in Medicare patient   [facility] Jerrol Banana., MD       Future Appointments             In 3 weeks Jerrol Banana., MD [facility], [place]   In 2 months Jerrol Banana., MD [facility], [place]

## 2020-11-18 ENCOUNTER — Other Ambulatory Visit: Payer: Self-pay | Admitting: Family Medicine

## 2020-11-18 NOTE — Telephone Encounter (Signed)
Please review.  Your last note (10/22/2020) showed pt taking Xarelto.   Thanks,   -Mickel Baas

## 2020-11-18 NOTE — Telephone Encounter (Signed)
Copied from [place] [phone]. Topic: Quick Communication - Rx Refill/Question >> Nov 18, 2020  1:07 PM Leward Quan A wrote: Medication: apixaban (ELIQUIS) 5 MG TABS tablet   Has the patient contacted their pharmacy? Yes.   (Agent: If no, request that the patient contact the pharmacy for the refill.) (Agent: If yes, when and what did the pharmacy advise?)  Preferred Pharmacy (with phone number or street name): [id number] [place] [address]  Phone:  [phone] Fax:  [phone]     Agent: Please be advised that RX refills may take up to 3 business days. We ask that you follow-up with your pharmacy.

## 2020-11-18 NOTE — Telephone Encounter (Signed)
  Notes to clinic:  Medication filled by a different provider  Review for continued use and refill    Requested Prescriptions  Pending Prescriptions Disp Refills   apixaban (ELIQUIS) 5 MG TABS tablet 60 tablet 0    Sig: Take 1 tablet (5 mg total) by mouth 2 (two) times daily.      Hematology:  Anticoagulants Passed - 11/18/2020  1:12 PM      Passed - HGB in normal range and within 360 days    Hemoglobin  Date Value Ref Range Status  10/22/2020 14.2 11.1 - 15.9 g/dL Final          Passed - PLT in normal range and within 360 days    Platelets  Date Value Ref Range Status  10/22/2020 280 150 - 450 x10E3/uL Final          Passed - HCT in normal range and within 360 days    Hematocrit  Date Value Ref Range Status  10/22/2020 42.2 34.0 - 46.6 % Final          Passed - Cr in normal range and within 360 days    Creat  Date Value Ref Range Status  03/07/2017 0.82 0.60 - 0.88 mg/dL Final    Comment:    For patients >110 years of age, the reference limit for Creatinine is approximately 13% higher for people identified as African-American. .    Creatinine, Ser  Date Value Ref Range Status  09/17/2020 0.65 0.44 - 1.00 mg/dL Final          Passed - Valid encounter within last 12 months    Recent Outpatient Visits           3 weeks ago [facility] Jerrol Banana., MD   2 months ago Acute ischemic left PCA stroke [facility])   [facility] Jerrol Banana., MD   4 months ago Hyperglycemia   [facility] Jerrol Banana., MD   4 months ago Atrophic urethritis   [facility] Jerrol Banana., MD   10 months ago Encounter for subsequent annual wellness visit (AWV) in Medicare patient   [facility] Jerrol Banana., MD       Future Appointments             In 2 weeks Jerrol Banana., MD [facility], [place]   In 2 months Jerrol Banana.,  MD [facility], [place]

## 2020-11-19 MED ORDER — APIXABAN 5 MG PO TABS: 5.0000 mg | ORAL_TABLET | Freq: Two times a day (BID) | ORAL | 5 refills | Status: DC

## 2020-11-20 DIAGNOSIS — R262 Difficulty in walking, not elsewhere classified: Secondary | ICD-10-CM | POA: Diagnosis not present

## 2020-11-20 DIAGNOSIS — M6281 Muscle weakness (generalized): Secondary | ICD-10-CM | POA: Diagnosis not present

## 2020-11-20 DIAGNOSIS — I6389 Other cerebral infarction: Secondary | ICD-10-CM | POA: Diagnosis not present

## 2020-11-20 DIAGNOSIS — R278 Other lack of coordination: Secondary | ICD-10-CM | POA: Diagnosis not present

## 2020-12-02 NOTE — Progress Notes (Signed)
Established patient visit   Patient: Deanna Williams   DOB: 1928-12-26   87 y.o. Female  MRN: [id number] Visit Date: 12/03/2020  Today's healthcare provider: [name], MD   Chief Complaint  Patient presents with   Hypertension   Dizziness   Subjective    HPI  Patient continues to have ongoing chronic dizzy spells.  She has had no falls.  This started with her stroke.  She continues to have numbness in the right hand and right leg.  She has had no falls. Hypertension, follow-up  BP Readings from Last 3 Encounters:  12/03/20 (!) 153/70  10/22/20 109/67  10/16/20 (!) 155/85   Wt Readings from Last 3 Encounters:  12/03/20 146 lb (66.2 kg)  10/22/20 150 lb (68 kg)  09/29/20 148 lb (67.1 kg)     She was last seen for hypertension 6 months ago.  BP at that visit was 109/67. Management since that visit includes; Decreased amlodipine from 5 to 2.5 mg daily. She reports excellent compliance with treatment. She is not having side effects.  She is exercising. She is adherent to low salt diet.   Outside blood pressures are not being checked.  She does not smoke.  Use of agents associated with hypertension: none.   --------------------------------------------------------------------------------------------------- Follow up for dizziness  The patient was last seen for this 6 weeks ago. Changes made at last visit include;  At this time will decrease amlodipine 5 mg to 2.5 mg daily.  She is advised strongly to stay hydrated and to move a little more slowly when transitioning.  She reports excellent compliance with treatment. She feels that condition is Worse.   -----------------------------------------------------------------------------------------      Medications: Outpatient Medications Prior to Visit  Medication Sig   amLODipine (NORVASC) 2.5 MG tablet Take 1 tablet (2.5 mg total) by mouth daily.   apixaban (ELIQUIS) 5 MG TABS tablet Take 1 tablet (5  mg total) by mouth 2 (two) times daily.   docusate sodium (COLACE) 100 MG capsule Take 1 capsule (100 mg total) by mouth 3 (three) times daily.   ticagrelor (BRILINTA) 90 MG TABS tablet Take 1 tablet (90 mg total) by mouth 2 (two) times daily.   acetaminophen (TYLENOL) 325 MG tablet Take 1-2 tablets (325-650 mg total) by mouth every 4 (four) hours as needed for mild pain.   No facility-administered medications prior to visit.    Review of Systems  Constitutional: Negative.  Negative for appetite change, chills, fatigue and fever.  Respiratory:  Positive for shortness of breath. Negative for apnea, cough, choking, wheezing and stridor.   Cardiovascular:  Positive for leg swelling. Negative for chest pain and palpitations.  Gastrointestinal: Negative.  Negative for abdominal pain, nausea and vomiting.  Neurological:  Positive for dizziness and numbness (Right hand and leg since her stroke 08/2020). Negative for syncope, speech difficulty, weakness, light-headedness and headaches.      Objective    BP (!) 153/70 (BP Location: Left Arm, Patient Position: Sitting, Cuff Size: Large)   Pulse 67   Wt 146 lb (66.2 kg)   SpO2 96%   BMI 25.06 kg/m     Physical Exam Vitals reviewed.  Constitutional:      Appearance: She is well-developed.  HENT:     Head: Normocephalic and atraumatic.     Right Ear: External ear normal.     Left Ear: External ear normal.     Nose: Nose normal.  Eyes:  General: No scleral icterus.    Conjunctiva/sclera: Conjunctivae normal.     Pupils: Pupils are equal, round, and reactive to light.  Neck:     Thyroid: No thyromegaly.  Cardiovascular:     Rate and Rhythm: Normal rate and regular rhythm.     Heart sounds: Normal heart sounds.  Pulmonary:     Effort: Pulmonary effort is normal.     Breath sounds: Normal breath sounds.  Abdominal:     Palpations: Abdomen is soft.     Tenderness: There is no abdominal tenderness.  Lymphadenopathy:     Cervical:  No cervical adenopathy.  Skin:    General: Skin is warm and dry.  Neurological:     General: No focal deficit present.     Mental Status: She is alert and oriented to person, place, and time.     Comments: Mild right sided weakness particularly in the upper arm.  Psychiatric:        Mood and Affect: Mood normal.        Behavior: Behavior normal.        Thought Content: Thought content normal.        Judgment: Judgment normal.      No results found for any visits on 12/03/20.  Assessment & Plan     1. Dizziness Think this is mainly due to the recent stroke.  Continue working on rehab for this but also use a cane or walker if necessary to avoid falls.  2. Acute cerebrovascular accident (CVA) due to occlusion of right posterior cerebral artery ([place]) Go ahead and finish Brilinta that she has not stopped according to neurology  3. Primary hypertension Fair control of blood pressure for this patient - CBC - Lipid panel - TSH  4. Hyperglycemia Follow-up A1c although unlikely to be an issue - Comprehensive metabolic panel - Hemoglobin A1c  5. Hypercholesterolemia Will need treatment of lipids if elevated all. - CBC - Lipid panel   No follow-ups on file.      I, [name], MD, have reviewed all documentation for this visit. The documentation on 12/08/20 for the exam, diagnosis, procedures, and orders are all accurate and complete.    Areana Kosanke Cranford Mon, MD  [facility] [phone] (phone) [phone] (fax)  [place]

## 2020-12-03 ENCOUNTER — Other Ambulatory Visit: Payer: Self-pay

## 2020-12-03 ENCOUNTER — Encounter: Payer: Self-pay | Admitting: Family Medicine

## 2020-12-03 ENCOUNTER — Ambulatory Visit (INDEPENDENT_AMBULATORY_CARE_PROVIDER_SITE_OTHER): Payer: PPO | Admitting: Family Medicine

## 2020-12-03 VITALS — BP 153/70 | HR 67 | Wt 146.0 lb

## 2020-12-03 DIAGNOSIS — E7800 Pure hypercholesterolemia, unspecified: Secondary | ICD-10-CM | POA: Diagnosis not present

## 2020-12-03 DIAGNOSIS — R42 Dizziness and giddiness: Secondary | ICD-10-CM | POA: Diagnosis not present

## 2020-12-03 DIAGNOSIS — R739 Hyperglycemia, unspecified: Secondary | ICD-10-CM | POA: Diagnosis not present

## 2020-12-03 DIAGNOSIS — I10 Essential (primary) hypertension: Secondary | ICD-10-CM | POA: Diagnosis not present

## 2020-12-03 DIAGNOSIS — I63531 Cerebral infarction due to unspecified occlusion or stenosis of right posterior cerebral artery: Secondary | ICD-10-CM

## 2020-12-04 LAB — COMPREHENSIVE METABOLIC PANEL
ALT: 14 IU/L (ref 0–32)
AST: 16 IU/L (ref 0–40)
Albumin/Globulin Ratio: 2.1 (ref 1.2–2.2)
Albumin: 4.2 g/dL (ref 3.5–4.6)
Alkaline Phosphatase: 76 IU/L (ref 44–121)
BUN/Creatinine Ratio: 16 (ref 12–28)
BUN: 13 mg/dL (ref 10–36)
Bilirubin Total: 0.6 mg/dL (ref 0.0–1.2)
CO2: 20 mmol/L (ref 20–29)
Calcium: 9.7 mg/dL (ref 8.7–10.3)
Chloride: 104 mmol/L (ref 96–106)
Creatinine, Ser: 0.81 mg/dL (ref 0.57–1.00)
Globulin, Total: 2 g/dL (ref 1.5–4.5)
Glucose: 103 mg/dL — ABNORMAL HIGH (ref 65–99)
Potassium: 4.5 mmol/L (ref 3.5–5.2)
Sodium: 141 mmol/L (ref 134–144)
Total Protein: 6.2 g/dL (ref 6.0–8.5)
eGFR: 69 mL/min/{1.73_m2} (ref >59)

## 2020-12-04 LAB — LIPID PANEL
Chol/HDL Ratio: 2.4 ratio (ref 0.0–4.4)
Cholesterol, Total: 197 mg/dL (ref 100–199)
HDL: 83 mg/dL (ref >39)
LDL Chol Calc (NIH): 99 mg/dL (ref 0–99)
Triglycerides: 82 mg/dL (ref 0–149)
VLDL Cholesterol Cal: 15 mg/dL (ref 5–40)

## 2020-12-04 LAB — CBC
Hematocrit: 43.4 % (ref 34.0–46.6)
Hemoglobin: 14.5 g/dL (ref 11.1–15.9)
MCH: 31.3 pg (ref 26.6–33.0)
MCHC: 33.4 g/dL (ref 31.5–35.7)
MCV: 94 fL (ref 79–97)
Platelets: 250 10*3/uL (ref 150–450)
RBC: 4.63 x10E6/uL (ref 3.77–5.28)
RDW: 12.6 % (ref 11.7–15.4)
WBC: 6.3 10*3/uL (ref 3.4–10.8)

## 2020-12-04 LAB — TSH: TSH: 3.11 u[IU]/mL (ref 0.450–4.500)

## 2020-12-04 LAB — HEMOGLOBIN A1C
Est. average glucose Bld gHb Est-mCnc: 131 mg/dL
Hgb A1c MFr Bld: 6.2 % — ABNORMAL HIGH (ref 4.8–5.6)

## 2020-12-04 NOTE — Patient Instructions (Addendum)
Discontinue Brilinta.

## 2020-12-07 ENCOUNTER — Ambulatory Visit: Payer: Self-pay

## 2020-12-07 DIAGNOSIS — M6281 Muscle weakness (generalized): Secondary | ICD-10-CM | POA: Diagnosis not present

## 2020-12-07 DIAGNOSIS — I6389 Other cerebral infarction: Secondary | ICD-10-CM | POA: Diagnosis not present

## 2020-12-07 DIAGNOSIS — R262 Difficulty in walking, not elsewhere classified: Secondary | ICD-10-CM | POA: Diagnosis not present

## 2020-12-07 DIAGNOSIS — R278 Other lack of coordination: Secondary | ICD-10-CM | POA: Diagnosis not present

## 2020-12-07 NOTE — Telephone Encounter (Signed)
Eden Emms PT with [facility] calling to report pt. Is there having therapy today. Still having dizziness issues , worse with walking and activity. BP today 150/96. Is able to continue with activities of daily living, "just still having the lightheadedness."  Wants PCP to be aware.

## 2020-12-07 NOTE — Telephone Encounter (Signed)
Answer Assessment - Initial Assessment Questions 1. DESCRIPTION: "Describe your dizziness."     Dizzy 2. LIGHTHEADED: "Do you feel lightheaded?" (e.g., somewhat faint, woozy, weak upon standing)     Lightheaded 3. VERTIGO: "Do you feel like either you or the room is spinning or tilting?" (i.e. vertigo)     No 4. SEVERITY: "How bad is it?"  "Do you feel like you are going to faint?" "Can you stand and walk?"   - MILD: Feels slightly dizzy, but walking normally.   - MODERATE: Feels unsteady when walking, but not falling; interferes with normal activities (e.g., school, work).   - SEVERE: Unable to walk without falling, or requires assistance to walk without falling; feels like passing out now.      Moderate 5. ONSET:  "When did the dizziness begin?"     April with CVA 6. AGGRAVATING FACTORS: "Does anything make it worse?" (e.g., standing, change in head position)     Worse with activity 7. HEART RATE: "Can you tell me your heart rate?" "How many beats in 15 seconds?"  (Note: not all patients can do this)       No 8. CAUSE: "What do you think is causing the dizziness?"     Unsure 9. RECURRENT SYMPTOM: "Have you had dizziness before?" If Yes, ask: "When was the last time?" "What happened that time?"     Yes 10. OTHER SYMPTOMS: "Do you have any other symptoms?" (e.g., fever, chest pain, vomiting, diarrhea, bleeding)       No 11. PREGNANCY: "Is there any chance you are pregnant?" "When was your last menstrual period?"       No  Protocols used: Dizziness - Lightheadedness-A-AH

## 2020-12-08 ENCOUNTER — Other Ambulatory Visit: Payer: Self-pay

## 2020-12-08 NOTE — Patient Outreach (Signed)
[facility]) Care Management  12/08/2020  Deanna Williams 1928/09/06 [id number]   Telephone outreach to patient to obtain mRS was successfully completed. MRS= 2   Thank you, [place] Care Management Assistant

## 2020-12-11 DIAGNOSIS — I6389 Other cerebral infarction: Secondary | ICD-10-CM | POA: Diagnosis not present

## 2020-12-11 DIAGNOSIS — M6281 Muscle weakness (generalized): Secondary | ICD-10-CM | POA: Diagnosis not present

## 2020-12-11 DIAGNOSIS — R278 Other lack of coordination: Secondary | ICD-10-CM | POA: Diagnosis not present

## 2020-12-11 DIAGNOSIS — R262 Difficulty in walking, not elsewhere classified: Secondary | ICD-10-CM | POA: Diagnosis not present

## 2020-12-15 DIAGNOSIS — R278 Other lack of coordination: Secondary | ICD-10-CM | POA: Diagnosis not present

## 2020-12-15 DIAGNOSIS — M6281 Muscle weakness (generalized): Secondary | ICD-10-CM | POA: Diagnosis not present

## 2020-12-15 DIAGNOSIS — I6389 Other cerebral infarction: Secondary | ICD-10-CM | POA: Diagnosis not present

## 2020-12-15 DIAGNOSIS — R262 Difficulty in walking, not elsewhere classified: Secondary | ICD-10-CM | POA: Diagnosis not present

## 2020-12-22 DIAGNOSIS — I6389 Other cerebral infarction: Secondary | ICD-10-CM | POA: Diagnosis not present

## 2020-12-22 DIAGNOSIS — R262 Difficulty in walking, not elsewhere classified: Secondary | ICD-10-CM | POA: Diagnosis not present

## 2020-12-22 DIAGNOSIS — M6281 Muscle weakness (generalized): Secondary | ICD-10-CM | POA: Diagnosis not present

## 2020-12-22 DIAGNOSIS — R278 Other lack of coordination: Secondary | ICD-10-CM | POA: Diagnosis not present

## 2020-12-24 NOTE — Progress Notes (Signed)
PCP:  Jerrol Banana., MD Primary Cardiologist: None Electrophysiologist: Will Meredith Leeds, MD   Deanna Williams is a 87 y.o. female seen today for Will Meredith Leeds, MD for routine electrophysiology followup.  Since last being seen in our clinic the patient reports doing about the same. She continues to struggle with severe dizziness. No benefit from vestibular PT.  In careful review of chart, Patient has actually not followed up with Neurology, she thought they were involved in the background, but AVS from stroke admission says "call to make an appointment". None noted in Epic.   She denies syncope. Has severe dizziness with prolonged walking, and nearly constant dizziness including during this visit will at rest with normal HRs.She denies chest pain, palpitations, dyspnea, PND, orthopnea, nausea, vomiting, syncope, edema, weight gain, or early satiety.  Past Medical History:  Diagnosis Date   Gastroesophageal reflux disease    Hyperlipidemia    Hypertension    Lichen sclerosus    Lichen sclerosus et atrophicus    Osteopenia    Palpitations    Past Surgical History:  Procedure Laterality Date   CARPAL TUNNEL RELEASE     FOOT SURGERY     HYSTEROSCOPY WITH D & C N/A 11/02/2015   Procedure: DILATATION AND CURETTAGE /HYSTEROSCOPY;  Surgeon: Brayton Mars, MD;  Location: [facility];  Service: Gynecology;  Laterality: N/A;   IR CT HEAD LTD  09/01/2020   IR INTRA CRAN STENT  09/01/2020   IR PERCUTANEOUS ART THROMBECTOMY/INFUSION INTRACRANIAL INC DIAG ANGIO  09/01/2020   IR RADIOLOGIST EVAL & MGMT  10/14/2020   RADIOLOGY WITH ANESTHESIA N/A 09/01/2020   Procedure: IR WITH ANESTHESIA;  Surgeon: Radiologist, Medication, MD;  Location: [facility];  Service: Radiology;  Laterality: N/A;    Current Outpatient Medications  Medication Sig Dispense Refill   amLODipine (NORVASC) 2.5 MG tablet Take 1 tablet (2.5 mg total) by mouth daily. 90 tablet 1   apixaban (ELIQUIS) 5 MG TABS tablet  Take 1 tablet (5 mg total) by mouth 2 (two) times daily. 60 tablet 5   No current facility-administered medications for this visit.    No Known Allergies  Social History   Socioeconomic History   Marital status: Widowed    Spouse name: Not on file   Number of children: 1   Years of education: Not on file   Highest education level: 11th grade  Occupational History   Not on file  Tobacco Use   Smoking status: Never   Smokeless tobacco: Never  Vaping Use   Vaping Use: Never used  Substance and Sexual Activity   Alcohol use: Yes    Alcohol/week: 0.0 standard drinks    Comment: rare- wine   Drug use: No   Sexual activity: Never  Other Topics Concern   Not on file  Social History Narrative   Not on file   Social Determinants of Health   Financial Resource Strain: Not on file  Food Insecurity: Not on file  Transportation Needs: Not on file  Physical Activity: Not on file  Stress: Not on file  Social Connections: Not on file  Intimate Partner Violence: Not on file    Review of Systems: All other systems reviewed and are otherwise negative except as noted above.  Physical Exam: Vitals:   12/25/20 1144  BP: 122/68  Pulse: 64  SpO2: 96%  Weight: 148 lb 3.2 oz (67.2 kg)  Height: '5\' 4"'$  (1.626 m)    GEN- The patient is well  appearing, alert and oriented x 3 today.   HEENT: normocephalic, atraumatic; sclera clear, conjunctiva pink; hearing intact; oropharynx clear; neck supple, no JVP Lymph- no cervical lymphadenopathy Lungs- Clear to ausculation bilaterally, normal work of breathing.  No wheezes, rales, rhonchi Heart- Regular rate and rhythm, no murmurs, rubs or gallops, PMI not laterally displaced GI- soft, non-tender, non-distended, bowel sounds present, no hepatosplenomegaly Extremities- no clubbing, cyanosis, or edema; DP/PT/radial pulses 2+ bilaterally MS- no significant deformity or atrophy Skin- warm and dry, no rash or lesion Psych- euthymic mood, full  affect Neuro- strength and sensation are intact  EKG is not ordered.   Additional studies reviewed include: Previous EP office notes.   Assessment and Plan:  1. CVA  Had residual numbness R side Due to some miscommunication she has not actually seen Neurology since her Stroke in April. Encouraged prompt follow up.  Her severe and ongoing dizziness is very likely neurologic. PT notes with normal to elevated BP and normal HRs during exercise despite severe dizziness.   2. PAF Continue eliquis for CHA2DS2-VASc of at least 4 Also on Brilinta for stenting done by IR. Rates are controled on exam and by PT notes. I do not think this is contributing to her dizziness.  Could consider Zio XT x 1 week for completeness if need to further evaluate.   Shirley Friar, PA-C  12/25/20 11:47 AM

## 2020-12-25 ENCOUNTER — Ambulatory Visit: Payer: PPO | Admitting: Student

## 2020-12-25 ENCOUNTER — Other Ambulatory Visit: Payer: Self-pay

## 2020-12-25 ENCOUNTER — Encounter: Payer: Self-pay | Admitting: Student

## 2020-12-25 VITALS — BP 122/68 | HR 64 | Ht 64.0 in | Wt 148.2 lb

## 2020-12-25 DIAGNOSIS — Z8673 Personal history of transient ischemic attack (TIA), and cerebral infarction without residual deficits: Secondary | ICD-10-CM

## 2020-12-25 DIAGNOSIS — I480 Paroxysmal atrial fibrillation: Secondary | ICD-10-CM | POA: Diagnosis not present

## 2020-12-25 NOTE — Patient Instructions (Signed)
Medication Instructions:  Your physician recommends that you continue on your current medications as directed. Please refer to the Current Medication list given to you today.  *If you need a refill on your cardiac medications before your next appointment, please call your pharmacy*   Lab Work: None If you have labs (blood work) drawn today and your tests are completely normal, you will receive your results only by: MyChart Message (if you have MyChart) OR A paper copy in the mail If you have any lab test that is abnormal or we need to change your treatment, we will call you to review the results.   Follow-Up: At [facility], you and your health needs are our priority.  As part of our continuing mission to provide you with exceptional heart care, we have created designated Provider Care Teams.  These Care Teams include your primary Cardiologist (physician) and Advanced Practice Providers (APPs -  Physician Assistants and Nurse Practitioners) who all work together to provide you with the care you need, when you need it.   Your next appointment:   6 month(s)  The format for your next appointment:   In Person  Provider:   You may see Will Martin Camnitz, MD or one of the following Advanced Practice Providers on your designated Care Team:   Renee Ursuy, PA-C Michael "Andy" Tillery, PA-C  

## 2020-12-28 ENCOUNTER — Telehealth: Payer: Self-pay | Admitting: Family Medicine

## 2020-12-28 DIAGNOSIS — H2513 Age-related nuclear cataract, bilateral: Secondary | ICD-10-CM | POA: Diagnosis not present

## 2020-12-28 NOTE — Telephone Encounter (Signed)
Copied from [place] [id number]. Topic: Referral - Request for Referral >> Dec 28, 2020 11:51 AM Leward Quan A wrote: Has patient seen PCP for this complaint? Yes.   *If NO, is insurance requiring patient see PCP for this issue before PCP can refer them? Referral for which specialty: Neurology Preferred provider/office: [facility] or something closer in [facility]  Reason for referral: Dizziness

## 2020-12-30 ENCOUNTER — Other Ambulatory Visit: Payer: Self-pay | Admitting: *Deleted

## 2020-12-30 DIAGNOSIS — I63531 Cerebral infarction due to unspecified occlusion or stenosis of right posterior cerebral artery: Secondary | ICD-10-CM

## 2020-12-30 DIAGNOSIS — R42 Dizziness and giddiness: Secondary | ICD-10-CM

## 2020-12-30 NOTE — Telephone Encounter (Signed)
Referred ordered.

## 2021-01-26 ENCOUNTER — Other Ambulatory Visit: Payer: Self-pay

## 2021-01-26 ENCOUNTER — Ambulatory Visit (INDEPENDENT_AMBULATORY_CARE_PROVIDER_SITE_OTHER): Payer: PPO | Admitting: Family Medicine

## 2021-01-26 VITALS — BP 138/80 | HR 66 | Temp 98.3°F | Resp 18 | Ht 64.0 in | Wt 151.0 lb

## 2021-01-26 DIAGNOSIS — I63532 Cerebral infarction due to unspecified occlusion or stenosis of left posterior cerebral artery: Secondary | ICD-10-CM

## 2021-01-26 DIAGNOSIS — Z23 Encounter for immunization: Secondary | ICD-10-CM | POA: Diagnosis not present

## 2021-01-26 DIAGNOSIS — Z0000 Encounter for general adult medical examination without abnormal findings: Secondary | ICD-10-CM | POA: Diagnosis not present

## 2021-01-26 DIAGNOSIS — R300 Dysuria: Secondary | ICD-10-CM

## 2021-01-26 LAB — POCT URINALYSIS DIPSTICK
Bilirubin, UA: NEGATIVE
Glucose, UA: NEGATIVE
Ketones, UA: NEGATIVE
Nitrite, UA: NEGATIVE
Protein, UA: NEGATIVE
Spec Grav, UA: 1.02 (ref 1.010–1.025)
Urobilinogen, UA: 0.2 E.U./dL
pH, UA: 6 (ref 5.0–8.0)

## 2021-01-26 MED ORDER — NITROFURANTOIN MONOHYD MACRO 100 MG PO CAPS: 100.0000 mg | ORAL_CAPSULE | Freq: Two times a day (BID) | ORAL | 0 refills | Status: DC

## 2021-01-26 NOTE — Patient Instructions (Signed)
Try Cranberry Juice and AZO for urine symptoms.

## 2021-01-26 NOTE — Progress Notes (Signed)
I,[name],acting as a scribe for [name], MD.,have documented all relevant documentation on the behalf of [name], MD,as directed by  [name], MD while in the presence of [name], MD.   Annual Wellness Visit     Patient: Deanna Williams, Female    DOB: 1929-02-13, 87 y.o.   MRN: [id number] Visit Date: 01/26/2021  Today's Provider: [name], MD   Chief Complaint  Patient presents with   Medicare Wellness   Subjective    Deanna Williams is a 87 y.o. female who presents today for her Annual Wellness Visit.Annual Physical. She reports consuming a general and low sodium diet. The patient does not participate in regular exercise at present. She generally feels fairly well. She reports sleeping poorly. She does not have additional problems to discuss today.   HPI She continues to complain of chronic dizziness since her stroke and has an appointment October 3 with neurology He does have some mild dysuria for the last 2 to 3 days.  No fever chills hematuria or back pain   Medications: Outpatient Medications Prior to Visit  Medication Sig   amLODipine (NORVASC) 2.5 MG tablet Take 1 tablet (2.5 mg total) by mouth daily.   apixaban (ELIQUIS) 5 MG TABS tablet Take 1 tablet (5 mg total) by mouth 2 (two) times daily.   No facility-administered medications prior to visit.    No Known Allergies  Patient Care Team: Jerrol Banana., MD as PCP - General (Family Medicine) Constance Haw, MD as PCP - Electrophysiology (Cardiology) Dingeldein, Remo Lipps, MD as Consulting Physician (Ophthalmology) Ralene Bathe, MD as Consulting Physician (Dermatology)  Review of Systems  Endocrine: Positive for polydipsia.  Genitourinary:  Positive for dysuria.  Neurological:  Positive for dizziness, light-headedness and numbness.  All other systems reviewed and are negative.       Objective    Vitals: BP 138/80 (BP Location:  Right Arm, Patient Position: Sitting, Cuff Size: Normal)   Pulse 66   Temp 98.3 F (36.8 C) (Oral)   Resp 18   Ht '5\' 4"'$  (1.626 m)   Wt 151 lb (68.5 kg)   SpO2 97%   BMI 25.92 kg/m     Physical Exam Vitals reviewed.  Constitutional:      Appearance: She is well-developed.  HENT:     Head: Normocephalic and atraumatic.     Right Ear: External ear normal.     Left Ear: External ear normal.     Nose: Nose normal.  Eyes:     General: No scleral icterus.    Conjunctiva/sclera: Conjunctivae normal.     Pupils: Pupils are equal, round, and reactive to light.  Neck:     Thyroid: No thyromegaly.  Cardiovascular:     Rate and Rhythm: Normal rate and regular rhythm.     Heart sounds: Normal heart sounds.  Pulmonary:     Effort: Pulmonary effort is normal.     Breath sounds: Normal breath sounds.  Abdominal:     Palpations: Abdomen is soft.  Lymphadenopathy:     Cervical: No cervical adenopathy.  Skin:    General: Skin is warm and dry.  Neurological:     Mental Status: She is alert and oriented to person, place, and time. Mental status is at baseline.  Psychiatric:        Mood and Affect: Mood normal.        Behavior: Behavior normal.  Thought Content: Thought content normal.        Judgment: Judgment normal.     Most recent functional status assessment: In your present state of health, do you have any difficulty performing the following activities: 01/26/2021  Hearing? N  Vision? Y  Difficulty concentrating or making decisions? Y  Walking or climbing stairs? Y  Dressing or bathing? N  Doing errands, shopping? Y  Some recent data might be hidden   Most recent fall risk assessment: Fall Risk  01/26/2021  Falls in the past year? 1  Number falls in past yr: 1  Injury with Fall? 1  Risk for fall due to : History of fall(s);Impaired balance/gait;Impaired mobility  Follow up Falls evaluation completed    Most recent depression screenings: PHQ 2/9 Scores 01/26/2021  12/03/2020  PHQ - 2 Score 0 0  PHQ- 9 Score 3 5   Most recent cognitive screening: 6CIT Screen 01/21/2020  What Year? 0 points  What month? 0 points  What time? 0 points  Count back from 20 0 points  Months in reverse 0 points  Repeat phrase 4 points  Total Score 4   Most recent Audit-C alcohol use screening Alcohol Use Disorder Test (AUDIT) 01/26/2021  1. How often do you have a drink containing alcohol? 0  2. How many drinks containing alcohol do you have on a typical day when you are drinking? 0  3. How often do you have six or more drinks on one occasion? 0  AUDIT-C Score 0  Alcohol Brief Interventions/Follow-up -   A score of 3 or more in women, and 4 or more in men indicates increased risk for alcohol abuse, EXCEPT if all of the points are from question 1   No results found for any visits on 01/26/21.  Assessment & Plan     Annual wellness visit done today including the all of the following: Reviewed patient's Family Medical History Reviewed and updated list of patient's medical providers Assessment of cognitive impairment was done Assessed patient's functional ability Established a written schedule for health screening [place] Completed and Reviewed  Exercise Activities and Dietary recommendations  Goals      DIET - INCREASE WATER INTAKE     Recommend increasing water intake 3 glasses of water a day.      Increase water intake     Starting 05/26/16, I will increase my water intake to 4 glasses daily.         Immunization History  Administered Date(s) Administered   Fluad Quad(high Dose 65+) 02/20/2020   Influenza, High Dose Seasonal PF 02/11/2016, 03/07/2017, 02/01/2018, 02/26/2019   Influenza-Unspecified 02/13/2014   PFIZER(Purple Top)SARS-COV-2 Vaccination 05/27/2019, 06/17/2019, 02/20/2020, 10/07/2020   Pneumococcal Conjugate-13 03/24/2014   Pneumococcal Polysaccharide-23 03/04/1998   Td 03/06/2008   Zoster Recombinat (Shingrix)  01/06/2017, 10/27/2017   Zoster, Live 07/30/2007    Health Maintenance  Topic Date Due   TETANUS/TDAP  03/06/2018   INFLUENZA VACCINE  12/14/2020   DEXA SCAN  Completed   COVID-19 Vaccine  Completed   PNA vac Low Risk Adult  Completed   Zoster Vaccines- Shingrix  Completed   HPV VACCINES  Aged Out   1. Encounter for subsequent annual wellness visit (AWV) in Medicare patient Will try to enter introduce MOST forms in the future.  2. Annual physical exam   3. Dysuria For 3 days with nitrofurantoin pending culture - POCT urinalysis dipstick - CULTURE, URINE COMPREHENSIVE - nitrofurantoin, macrocrystal-monohydrate, (MACROBID) 100 MG capsule;  Take 1 capsule (100 mg total) by mouth 2 (two) times daily.  Dispense: 10 capsule; Refill: 0  4. Need for influenza vaccination  - Flu Vaccine QUAD High Dose(Fluad)  5. Need for pneumococcal vaccination  - Pneumococcal conjugate vaccine 20-valent (Prevnar 20)  6. Acute ischemic left PCA stroke (HCC) Dizziness definitely feel it is from the stroke.  Neurology appointment in a couple weeks.  I will see her back for severe   Discussed health benefits of physical activity, and encouraged her to engage in regular exercise appropriate for her age and condition.       Return in about 5 weeks (around 03/02/2021).     I, [name], MD, have reviewed all documentation for this visit. The documentation on 01/31/21 for the exam, diagnosis, procedures, and orders are all accurate and complete.    Rayford Williamsen Cranford Mon, MD  [facility] [phone] (phone) [phone] (fax)  [place]

## 2021-01-29 LAB — CULTURE, URINE COMPREHENSIVE

## 2021-02-15 ENCOUNTER — Ambulatory Visit: Payer: PPO | Admitting: Neurology

## 2021-02-15 ENCOUNTER — Encounter: Payer: Self-pay | Admitting: Neurology

## 2021-02-15 VITALS — BP 155/78 | HR 65 | Ht 63.0 in | Wt 147.0 lb

## 2021-02-15 DIAGNOSIS — G5603 Carpal tunnel syndrome, bilateral upper limbs: Secondary | ICD-10-CM

## 2021-02-15 DIAGNOSIS — I63532 Cerebral infarction due to unspecified occlusion or stenosis of left posterior cerebral artery: Secondary | ICD-10-CM | POA: Diagnosis not present

## 2021-02-15 NOTE — Patient Instructions (Signed)
Continue current medications  Physical activity as tolerated  Remain well hydrated  Use wrist brace at night for carpal tunnel syndrome  Use walker/cane with ambulation  Return in 6 months or sooner if worse

## 2021-02-15 NOTE — Progress Notes (Signed)
[facility]  PATIENT: Deanna Williams DOB: Sep 23, 1928  REFERRING CLINICIAN: Jerrol Banana.,* HISTORY FROM: Patient and daughter in law REASON FOR VISIT: Recent L PCA embolic stroke    HISTORICAL  CHIEF COMPLAINT:  Chief Complaint  Patient presents with   New Patient (Initial Visit)    Rm 2, with daughter in law, CVA fu, c/o dizziness that lingers all day with mild headaches     HISTORY OF PRESENT ILLNESS:  This is a 87 year old woman with past medical history of atrial fibrillation who is presenting following a admission for a left PCA territory stroke.  Patient presented on April 19 due to right-sided weakness and numbness.  She was found to have a left occipital, parietal cortex infarct with hyperdense sign.  She underwent cerebral angio with thrombectomy and stent placement.  Follow-up MRI show a left PCA ischemia involving occipital and medial temporal and thalamus.  Etiology of her stroke was though to be cardioembolic. She was discharged with a loop recorder.  The loop recorder showed evidence of A. fib therefore patient was started on Apixaban as her stroke was likely cardioembolic from atrial fibrillation.  Since discharge from the hospital she has completed physical therapy, had good recovery from her stroke.  Currently she has complaint of dizziness worse with head movement and mild headache.  She describes her dizziness as lightheadedness, she use a walker and a cane for ambulation.  She denies any falls due to the dizziness.  Again dizziness is worse with head movement, when looking down or looking on the side.  There are no associated nausea or vomiting, no hearing loss or tinnitus.   Other complaint brought up today" mild headache for which she takes Tylenol as needed.     OTHER MEDICAL CONDITIONS: Atrial fibrillation, Hypertension   REVIEW OF SYSTEMS: Full 14 system review of systems performed and negative with exception of: as noted in the  HPI.   ALLERGIES: No Known Allergies  HOME MEDICATIONS: Outpatient Medications Prior to Visit  Medication Sig Dispense Refill   amLODipine (NORVASC) 2.5 MG tablet Take 1 tablet (2.5 mg total) by mouth daily. 90 tablet 1   apixaban (ELIQUIS) 5 MG TABS tablet Take 1 tablet (5 mg total) by mouth 2 (two) times daily. 60 tablet 5   nitrofurantoin, macrocrystal-monohydrate, (MACROBID) 100 MG capsule Take 1 capsule (100 mg total) by mouth 2 (two) times daily. 10 capsule 0   No facility-administered medications prior to visit.    PAST MEDICAL HISTORY: Past Medical History:  Diagnosis Date   Gastroesophageal reflux disease    Hyperlipidemia    Hypertension    Lichen sclerosus    Lichen sclerosus et atrophicus    Osteopenia    Palpitations     PAST SURGICAL HISTORY: Past Surgical History:  Procedure Laterality Date   CARPAL TUNNEL RELEASE     FOOT SURGERY     HYSTEROSCOPY WITH D & C N/A 11/02/2015   Procedure: DILATATION AND CURETTAGE /HYSTEROSCOPY;  Surgeon: Brayton Mars, MD;  Location: [facility];  Service: Gynecology;  Laterality: N/A;   IR CT HEAD LTD  09/01/2020   IR INTRA CRAN STENT  09/01/2020   IR PERCUTANEOUS ART THROMBECTOMY/INFUSION INTRACRANIAL INC DIAG ANGIO  09/01/2020   IR RADIOLOGIST EVAL & MGMT  10/14/2020   RADIOLOGY WITH ANESTHESIA N/A 09/01/2020   Procedure: IR WITH ANESTHESIA;  Surgeon: Radiologist, Medication, MD;  Location: [facility];  Service: Radiology;  Laterality: N/A;    FAMILY HISTORY: Family  History  Problem Relation Age of Onset   Cancer Mother    Leukemia Mother    Cancer Father        colon   Hypertension Father    Arthritis Sister    Breast cancer Neg Hx     SOCIAL HISTORY: Social History   Socioeconomic History   Marital status: Widowed    Spouse name: Not on file   Number of children: 1   Years of education: Not on file   Highest education level: 11th grade  Occupational History   Not on file  Tobacco Use   Smoking status: Never    Smokeless tobacco: Never  Vaping Use   Vaping Use: Never used  Substance and Sexual Activity   Alcohol use: Yes    Alcohol/week: 0.0 standard drinks    Comment: rare- wine   Drug use: No   Sexual activity: Never  Other Topics Concern   Not on file  Social History Narrative   Not on file   Social Determinants of Health   Financial Resource Strain: Not on file  Food Insecurity: Not on file  Transportation Needs: Not on file  Physical Activity: Not on file  Stress: Not on file  Social Connections: Not on file  Intimate Partner Violence: Not on file     PHYSICAL EXAM  GENERAL EXAM/CONSTITUTIONAL: Vitals:  Vitals:   02/15/21 1117  BP: (!) 155/78  Pulse: 65  Weight: 147 lb (66.7 kg)  Height: 5\' 3"  (1.6 m)   Body mass index is 26.04 kg/m. Wt Readings from Last 3 Encounters:  02/15/21 147 lb (66.7 kg)  01/26/21 151 lb (68.5 kg)  12/25/20 148 lb 3.2 oz (67.2 kg)   Patient is in no distress; well developed, nourished and groomed; neck is supple  EYES: Pupils round and reactive to light, Visual fields full to confrontation, Extraocular movements intacts,   MUSCULOSKELETAL: Gait, strength, tone, movements noted in Neurologic exam below  NEUROLOGIC: MENTAL STATUS:  No flowsheet data found. awake, alert, oriented to person, place and time recent and remote memory intact normal attention and concentration language fluent, comprehension intact, naming intact fund of knowledge appropriate  CRANIAL NERVE:  2nd, 3rd, 4th, 6th - pupils equal and reactive to light, visual fields full to confrontation, extraocular muscles intact, no nystagmus 5th - facial sensation symmetric 7th - facial strength symmetric 8th - hearing intact 9th - palate elevates symmetrically, uvula midline 11th - shoulder shrug symmetric 12th - tongue protrusion midline  MOTOR:  normal bulk and tone, full strength in the BUE, BLE  SENSORY:  normal and symmetric to light touch, pinprick,  temperature, vibration  COORDINATION:  finger-nose-finger, fine finger movements normal  REFLEXES:  deep tendon reflexes present and symmetric  GAIT/STATION:  normal    DIAGNOSTIC DATA (LABS, IMAGING, TESTING) - I reviewed patient records, labs, notes, testing and imaging myself where available.  Lab Results  Component Value Date   WBC 6.3 12/03/2020   HGB 14.5 12/03/2020   HCT 43.4 12/03/2020   MCV 94 12/03/2020   PLT 250 12/03/2020      Component Value Date/Time   NA 141 12/03/2020 0953   K 4.5 12/03/2020 0953   CL 104 12/03/2020 0953   CO2 20 12/03/2020 0953   GLUCOSE 103 (H) 12/03/2020 0953   GLUCOSE 115 (H) 09/17/2020 0215   BUN 13 12/03/2020 0953   CREATININE 0.81 12/03/2020 0953   CREATININE 0.82 03/07/2017 0855   CALCIUM 9.7 12/03/2020 0953   PROT  6.2 12/03/2020 0953   ALBUMIN 4.2 12/03/2020 0953   AST 16 12/03/2020 0953   ALT 14 12/03/2020 0953   ALKPHOS 76 12/03/2020 0953   BILITOT 0.6 12/03/2020 0953   GFRNONAA >60 09/17/2020 0215   GFRNONAA 65 03/07/2017 0855   GFRAA 66 01/21/2020 1041   GFRAA 75 03/07/2017 0855   Lab Results  Component Value Date   CHOL 197 12/03/2020   HDL 83 12/03/2020   LDLCALC 99 12/03/2020   TRIG 82 12/03/2020   CHOLHDL 2.4 12/03/2020   Lab Results  Component Value Date   HGBA1C 6.2 (H) 12/03/2020   Lab Results  Component Value Date   VITAMINB12 512 10/22/2020   Lab Results  Component Value Date   TSH 3.110 12/03/2020    Head CT 09/01/2020 1. Small acute infarct in the left occipital parietal cortex. The adjacent PCA is hyperdense on reformats, suggesting acute thrombosis. The visible infarct is smaller than would be expected for the level of vessel hyperdensity, consider CTA/perfusion if a treatment candidate. 2. No intracranial hemorrhage   CTA Head and Neck 09/01/2020 1. Confirmed left PCA occlusion at the left P2 segment with downstream reconstitution followed by a severe stenosis at the left PCA  bifurcation. 2. Intracranial atherosclerosis with notable right A2, left M2, and left V4 segment stenoses. 3. Atherosclerosis in the neck without flow limiting stenosis. 4. Left brachiocephalic vein stenosis   CTP 09/01/2020 Left PCA penumbra which is smaller than the reported 42 mL mismatch volume as that also includes bilateral cerebellar tissue.   MRI Brain and MRI Head 09/02/2020 1. Left PCA territory acute ischemia involving the left occipital lobe, medial left temporal lobe and left thalamus. No acute hemorrhage or mass effect. 2. Punctate focus of acute ischemia at the base of the right precentral gyrus. 3. Severe stenosis of the proximal M2 segment inferior division of the left MCA. 4. Status post stent placement of left PCA P1 and P2 segments with normal distal flow related enhancement.  ASSESSMENT AND PLAN  87 y.o. year old female with vascular risk factor including atrial fibrillation and hypertension who was admitted for right-sided weakness found to have a left PCA territory stroke.  Stroke etiology likely cardioembolic from atrial fibrillation.  Patient has been anticoagulated with apixaban.  She has completed physical therapy with good recovery.  She is ambulatory with a walker and a cane.  However she still complains of dizziness worse with head movement and slight headache.  I discussed with her possibilities to start meclizine and due to the side effect of dry mouth and she has declined.  She has a history of carpal tunnel syndrome and complaint of bilateral hand numbness right worse than left and I also recommended to start low-dose gabapentin that may be beneficial for headache but she also declined medication stating that she usually wake up in the middle of the night to walk her dog and she afraid of falls.  I did explain to her that the medication make patient sleepy and taking it and waking up in the middle of the night to walk her dog is not ideal as she can be drowsy and  can have a fall.  Currently will defer all medications, I will see her in 77-month for follow-up.    1. Acute ischemic left PCA stroke ([place])   2. Bilateral carpal tunnel syndrome    PLAN: Continue current medications  Physical activity as tolerated  Remain well hydrated  Use wrist brace at night for  carpal tunnel syndrome  Use walker/cane with ambulation  Return in 6 months or sooner if worse   No orders of the defined types were placed in this encounter.   No orders of the defined types were placed in this encounter.   Return in about 6 months (around 08/16/2021).    Alric Ran, MD 02/15/2021, 4:15 PM  [facility] [address], [place], [place] [address] [phone]

## 2021-03-22 ENCOUNTER — Other Ambulatory Visit (HOSPITAL_COMMUNITY): Payer: Self-pay | Admitting: Interventional Radiology

## 2021-03-22 ENCOUNTER — Telehealth: Payer: Self-pay

## 2021-03-22 ENCOUNTER — Telehealth (HOSPITAL_COMMUNITY): Payer: Self-pay

## 2021-03-22 DIAGNOSIS — I639 Cerebral infarction, unspecified: Secondary | ICD-10-CM

## 2021-03-22 NOTE — Telephone Encounter (Signed)
Called to schedule cta head/neck. They will call me back to schedule. AW

## 2021-03-22 NOTE — Telephone Encounter (Signed)
Copied from [place] [phone]. Topic: General - Call Back - No Documentation >> Mar 22, 2021 10:33 AM Erick Blinks wrote: Best contact: [phone]   Jolayne Haines from an assisted living org. called to check if fax was received. She submitted it at 9:57 am today. Wants call back to confirm.

## 2021-03-23 NOTE — Telephone Encounter (Signed)
Fax was received. Tammi Klippel was advised.

## 2021-03-26 ENCOUNTER — Telehealth: Payer: Self-pay | Admitting: Family Medicine

## 2021-03-26 ENCOUNTER — Other Ambulatory Visit: Payer: Self-pay | Admitting: *Deleted

## 2021-03-26 DIAGNOSIS — Z111 Encounter for screening for respiratory tuberculosis: Secondary | ICD-10-CM

## 2021-03-26 NOTE — Telephone Encounter (Signed)
TB test ordered. Deanna Williams was advised.

## 2021-03-26 NOTE — Telephone Encounter (Signed)
PAMELA Depaoli POTEAT, DIL is calling to request a TB test. As the pt is moving to [place]. Looking to come on Monday. And Nanine Means is fine to e-mail and fax. Please advise [phone]

## 2021-03-29 DIAGNOSIS — Z111 Encounter for screening for respiratory tuberculosis: Secondary | ICD-10-CM | POA: Diagnosis not present

## 2021-04-01 ENCOUNTER — Telehealth: Payer: Self-pay | Admitting: *Deleted

## 2021-04-01 NOTE — Telephone Encounter (Signed)
Copied from [place] [id number]. Topic: General - Other >> Apr 01, 2021 10:39 AM Erick Blinks wrote: Reason for CRM: Pt's daughter called to see if TB test and FL2 forms can be faxed to [facility] so the patient can be registered.   Best contact: [phone] Jeannene Patella) is requesting to have this done

## 2021-04-01 NOTE — Telephone Encounter (Signed)
Form is ready for provider to sign. TB test has not resulted yet.

## 2021-04-03 LAB — QUANTIFERON-TB GOLD PLUS
QuantiFERON Mitogen Value: 6.68 IU/mL
QuantiFERON Nil Value: 0.01 IU/mL
QuantiFERON TB1 Ag Value: 0 IU/mL
QuantiFERON TB2 Ag Value: 0 IU/mL
QuantiFERON-TB Gold Plus: NEGATIVE

## 2021-04-05 NOTE — Telephone Encounter (Signed)
TB test in. Do you have the form?

## 2021-04-05 NOTE — Telephone Encounter (Signed)
Should be (and filled out.

## 2021-04-05 NOTE — Telephone Encounter (Signed)
Pt daughter in law is calling because Deanna Williams is stating that they have not received the FL2 & TB date as of yet. The patients move in date is 04/15/21. Please advise CB- [phone] Tora Duck. Do not need to call back please send to [place], [email or web address]

## 2021-04-06 NOTE — Telephone Encounter (Signed)
Faxed yesterday by [facility] [name].

## 2021-04-16 ENCOUNTER — Other Ambulatory Visit: Payer: Self-pay | Admitting: Family Medicine

## 2021-04-16 DIAGNOSIS — I10 Essential (primary) hypertension: Secondary | ICD-10-CM

## 2021-04-17 NOTE — Telephone Encounter (Signed)
Requested Prescriptions  Pending Prescriptions Disp Refills  . amLODipine (NORVASC) 2.5 MG tablet [Pharmacy Med Name: AMLODIPINE BESYLATE 2.5 MG TAB] 90 tablet 0    Sig: TAKE 1 TABLET BY MOUTH EVERY DAY     Cardiovascular:  Calcium Channel Blockers Failed - 04/16/2021  7:57 PM      Failed - Last BP in normal range    BP Readings from Last 1 Encounters:  02/15/21 (!) 155/78         Passed - Valid encounter within last 6 months    Recent Outpatient Visits          2 months ago Encounter for subsequent annual wellness visit (AWV) in Medicare patient   [name] Rosanna Randy, Retia Passe., MD   4 months ago [place] Jerrol Banana., MD   5 months ago [place] Jerrol Banana., MD   7 months ago Acute ischemic left PCA stroke [facility])   [facility] Jerrol Banana., MD   9 months ago Hyperglycemia   [facility] Jerrol Banana., MD      Future Appointments            In 2 months Jerrol Banana., MD [facility], [place]   In 2 months Braggs, Ocie Doyne, MD [place], [facility]

## 2021-04-21 DIAGNOSIS — I10 Essential (primary) hypertension: Secondary | ICD-10-CM | POA: Diagnosis not present

## 2021-04-21 DIAGNOSIS — I69851 Hemiplegia and hemiparesis following other cerebrovascular disease affecting right dominant side: Secondary | ICD-10-CM | POA: Diagnosis not present

## 2021-04-21 DIAGNOSIS — I482 Chronic atrial fibrillation, unspecified: Secondary | ICD-10-CM | POA: Diagnosis not present

## 2021-04-26 ENCOUNTER — Ambulatory Visit (HOSPITAL_COMMUNITY): Payer: PPO

## 2021-04-26 ENCOUNTER — Other Ambulatory Visit: Payer: Self-pay

## 2021-04-26 ENCOUNTER — Other Ambulatory Visit (HOSPITAL_COMMUNITY): Payer: Self-pay | Admitting: Interventional Radiology

## 2021-04-26 ENCOUNTER — Ambulatory Visit (HOSPITAL_COMMUNITY)
Admission: RE | Admit: 2021-04-26 | Discharge: 2021-04-26 | Disposition: A | Discharge status: Home / Self Care | Payer: PPO | Source: Ambulatory Visit | Attending: Interventional Radiology | Admitting: Interventional Radiology

## 2021-04-26 DIAGNOSIS — I672 Cerebral atherosclerosis: Secondary | ICD-10-CM | POA: Diagnosis not present

## 2021-04-26 DIAGNOSIS — I639 Cerebral infarction, unspecified: Secondary | ICD-10-CM

## 2021-04-26 DIAGNOSIS — I63233 Cerebral infarction due to unspecified occlusion or stenosis of bilateral carotid arteries: Secondary | ICD-10-CM | POA: Diagnosis not present

## 2021-04-26 DIAGNOSIS — I739 Peripheral vascular disease, unspecified: Secondary | ICD-10-CM | POA: Diagnosis not present

## 2021-04-26 LAB — POCT I-STAT CREATININE: Creatinine, Ser: 0.9 mg/dL (ref 0.44–1.00)

## 2021-04-26 MED ORDER — IOHEXOL 350 MG/ML SOLN
75.0000 mL | Freq: Once | INTRAVENOUS | Status: AC | PRN
  Administered 2021-04-26: 75 mL via INTRAVENOUS

## 2021-04-27 ENCOUNTER — Other Ambulatory Visit (HOSPITAL_COMMUNITY): Payer: Self-pay | Admitting: Physician Assistant

## 2021-04-28 ENCOUNTER — Telehealth (HOSPITAL_COMMUNITY): Payer: Self-pay

## 2021-04-28 NOTE — Telephone Encounter (Signed)
Pt agreed to f/u in 6 months with cta head/neck. AW 

## 2021-05-06 DIAGNOSIS — I10 Essential (primary) hypertension: Secondary | ICD-10-CM | POA: Diagnosis not present

## 2021-05-06 DIAGNOSIS — I482 Chronic atrial fibrillation, unspecified: Secondary | ICD-10-CM | POA: Diagnosis not present

## 2021-05-06 DIAGNOSIS — I6340 Cerebral infarction due to embolism of unspecified cerebral artery: Secondary | ICD-10-CM | POA: Diagnosis not present

## 2021-05-13 DIAGNOSIS — I6340 Cerebral infarction due to embolism of unspecified cerebral artery: Secondary | ICD-10-CM | POA: Diagnosis not present

## 2021-05-13 DIAGNOSIS — I69851 Hemiplegia and hemiparesis following other cerebrovascular disease affecting right dominant side: Secondary | ICD-10-CM | POA: Diagnosis not present

## 2021-05-19 DIAGNOSIS — I69851 Hemiplegia and hemiparesis following other cerebrovascular disease affecting right dominant side: Secondary | ICD-10-CM | POA: Diagnosis not present

## 2021-05-19 DIAGNOSIS — I482 Chronic atrial fibrillation, unspecified: Secondary | ICD-10-CM | POA: Diagnosis not present

## 2021-05-19 DIAGNOSIS — I10 Essential (primary) hypertension: Secondary | ICD-10-CM | POA: Diagnosis not present

## 2021-05-19 DIAGNOSIS — N3281 Overactive bladder: Secondary | ICD-10-CM | POA: Diagnosis not present

## 2021-06-22 ENCOUNTER — Other Ambulatory Visit: Payer: Self-pay | Admitting: Family Medicine

## 2021-06-22 DIAGNOSIS — I10 Essential (primary) hypertension: Secondary | ICD-10-CM

## 2021-06-22 MED ORDER — APIXABAN 5 MG PO TABS: 5.0000 mg | ORAL_TABLET | Freq: Two times a day (BID) | ORAL | 1 refills | Status: DC

## 2021-06-22 NOTE — Telephone Encounter (Unsigned)
Copied from [place] [id number]. Topic: Quick Communication - Rx Refill/Question >> Jun 22, 2021  9:36 AM Tessa Lerner A wrote: Medication: amLODipine (NORVASC) 2.5 MG tablet [[id number]]   apixaban (ELIQUIS) 5 MG TABS tablet [[id number]]   Has the patient contacted their pharmacy? No. (Agent: If no, request that the patient contact the pharmacy for the refill. If patient does not wish to contact the pharmacy document the reason why and proceed with request.) (Agent: If yes, when and what did the pharmacy advise?)  Preferred Pharmacy (with phone number or street name): [id number] [place] [address] [place] [address] Phone: [phone] Fax: [phone] Hours: Not open 24 hours  Has the patient been seen for an appointment in the last year OR does the patient have an upcoming appointment? Yes.    Agent: Please be advised that RX refills may take up to 3 business days. We ask that you follow-up with your pharmacy.

## 2021-06-22 NOTE — Telephone Encounter (Signed)
Requested medication (s) are due for refill today: Yes  Requested medication (s) are on the active medication list: Yes  Last refill:  04/16/21  Future visit scheduled: Yes  Notes to clinic:  Unable to refill per protocol due to diagnosis code needed.      Requested Prescriptions  Pending Prescriptions Disp Refills   amLODipine (NORVASC) 2.5 MG tablet 90 tablet 0    Sig: Take 1 tablet (2.5 mg total) by mouth daily.     Cardiovascular: Calcium Channel Blockers 2 Failed - 06/22/2021 12:31 PM      Failed - Last BP in normal range    BP Readings from Last 1 Encounters:  02/15/21 (!) 155/78          Passed - Last Heart Rate in normal range    Pulse Readings from Last 1 Encounters:  02/15/21 65          Passed - Valid encounter within last 6 months    Recent Outpatient Visits           4 months ago Encounter for subsequent annual wellness visit (AWV) in Medicare patient   [name], MD   6 months ago [name], MD   8 months ago [name], MD   9 months ago Acute ischemic left PCA stroke [facility])   [facility] Jerrol Banana., MD   11 months ago Hyperglycemia   [name], MD       Future Appointments             In 1 week Mecum, Dani Gobble, PA-C [name], [name]            Signed Prescriptions Disp Refills   apixaban (ELIQUIS) 5 MG TABS tablet 180 tablet 1    Sig: Take 1 tablet (5 mg total) by mouth 2 (two) times daily.     Hematology:  Anticoagulants - apixaban Passed - 06/22/2021 12:31 PM      Passed - PLT in normal range and within 360 days    Platelets  Date Value Ref Range Status  12/03/2020 250 150 - 450 x10E3/uL Final          Passed - HGB in normal range and within 360 days    Hemoglobin  Date Value Ref Range Status  12/03/2020 14.5  11.1 - 15.9 g/dL Final          Passed - HCT in normal range and within 360 days    Hematocrit  Date Value Ref Range Status  12/03/2020 43.4 34.0 - 46.6 % Final          Passed - Cr in normal range and within 360 days    Creat  Date Value Ref Range Status  03/07/2017 0.82 0.60 - 0.88 mg/dL Final    Comment:    For patients >41 years of age, the reference limit for Creatinine is approximately 13% higher for people identified as African-American. .    Creatinine, Ser  Date Value Ref Range Status  04/26/2021 0.90 0.44 - 1.00 mg/dL Final          Passed - AST in normal range and within 360 days    AST  Date Value Ref Range Status  12/03/2020 16 0 - 40 IU/L Final          Passed -  ALT in normal range and within 360 days    ALT  Date Value Ref Range Status  12/03/2020 14 0 - 32 IU/L Final          Passed - Valid encounter within last 12 months    Recent Outpatient Visits           4 months ago Encounter for subsequent annual wellness visit (AWV) in Medicare patient   [name], MD   6 months ago [name], MD   8 months ago [name], MD   9 months ago Acute ischemic left PCA stroke [facility])   [facility] Jerrol Banana., MD   11 months ago Hyperglycemia   [name], MD       Future Appointments             In 1 week Mecum, Dani Gobble, PA-C [name], [name]

## 2021-06-23 MED ORDER — AMLODIPINE BESYLATE 2.5 MG PO TABS: 2.5000 mg | ORAL_TABLET | Freq: Every day | ORAL | 0 refills | Status: DC

## 2021-06-28 ENCOUNTER — Ambulatory Visit: Payer: PPO | Admitting: Family Medicine

## 2021-07-02 ENCOUNTER — Ambulatory Visit (INDEPENDENT_AMBULATORY_CARE_PROVIDER_SITE_OTHER): Payer: PPO | Admitting: Physician Assistant

## 2021-07-02 ENCOUNTER — Encounter: Payer: Self-pay | Admitting: Physician Assistant

## 2021-07-02 ENCOUNTER — Other Ambulatory Visit: Payer: Self-pay

## 2021-07-02 VITALS — BP 137/75 | HR 71 | Temp 97.5°F | Resp 16 | Ht 60.0 in | Wt 147.0 lb

## 2021-07-02 DIAGNOSIS — R350 Frequency of micturition: Secondary | ICD-10-CM | POA: Diagnosis not present

## 2021-07-02 DIAGNOSIS — R7303 Prediabetes: Secondary | ICD-10-CM

## 2021-07-02 DIAGNOSIS — I10 Essential (primary) hypertension: Secondary | ICD-10-CM | POA: Diagnosis not present

## 2021-07-02 LAB — POCT URINALYSIS DIPSTICK
Bilirubin, UA: NEGATIVE
Blood, UA: NEGATIVE
Glucose, UA: NEGATIVE
Ketones, UA: NEGATIVE
Leukocytes, UA: NEGATIVE
Nitrite, UA: NEGATIVE
Odor: NEGATIVE
Protein, UA: NEGATIVE
Spec Grav, UA: 1.01 (ref 1.010–1.025)
Urobilinogen, UA: 0.2 E.U./dL
pH, UA: 6 (ref 5.0–8.0)

## 2021-07-02 NOTE — Assessment & Plan Note (Signed)
Chronic, historic concern A1c has not been checked since 11/2020- recheck as this may be cause of increased frequent urination and polydipsia Follow after results recommended

## 2021-07-02 NOTE — Assessment & Plan Note (Signed)
Chronic, historic concern, unmanaged  Unsure of etiology- currently ruling out diabetes mediated polyuria with labs May need referral to urology if lifestyle modifications are not successful at relieving symptoms Provided information to help with reducing frequent urination Patient was mildly hesitant to go to Urology so will refrain until other management strategies have failed

## 2021-07-02 NOTE — Patient Instructions (Signed)
°  If possible please take it at home using an electronic blood pressure cuff for the upper arm Record your blood pressure once per day and bring them back with you to your apt so we can make sure you are not developing high blood pressure.   Incorporating a minimum of 150 minutes (20-30 minutes per day) of moderate intensity physical activity can help improve your heart health and reduce the chances of high blood pressure and other cardiovascular risks. Incorporating a heart healthy diet can also help reduce the chances of heart attack and high cholesterol.

## 2021-07-02 NOTE — Assessment & Plan Note (Signed)
Chronic, historic condition  Appears Well managed with Amlodipine Continue current medication  Recommend patient measure BP at home until next follow up to ensure proper management  She reports lower extremity edema as a concern - may need to switch to another HTN medication if this continues

## 2021-07-02 NOTE — Progress Notes (Signed)
Established patient visit   Patient: Deanna Williams   DOB: Oct 24, 1928   88 y.o. Female  MRN: [id number] Visit Date: 07/02/2021  Today's healthcare provider: Dani Gobble Abisola Carrero, PA-C  Introduced myself to the patient as a Journalist, newspaper and provided education on APPs in clinical practice.   Chief Complaint  Patient presents with   Follow-up   Subjective    HPI  Hypertension: Patient is here for follow-up.  Cardiac symptoms lower extremity edema. Patient denies chest pain, chest pressure/discomfort, exertional chest pressure/discomfort, fatigue, irregular heart beat, and near-syncope.   Cardiovascular risk factors: advanced age (older than 31 for men, 86 for women), dyslipidemia, and hypertension. She is currently on Amlodipine 2.5 mg tablet.   She is concerned for her BP being elevated at home - states she was told at [facility] that it was elevated yesterday and she should have it checked    BP Readings from Last 3 Encounters:  07/02/21 137/75  02/15/21 (!) 155/78  01/26/21 138/80    Wt Readings from Last 3 Encounters:  07/02/21 147 lb (66.7 kg)  02/15/21 147 lb (66.7 kg)  01/26/21 151 lb (68.5 kg)     Patient also c/o frequent urination. No Dysuria, urgency, or difficulty Patient states she has to get up frequently in the night to urinate States she is urinating normally - not reduced volume or difficulty at night  States she may be up 5-6 times per night to go to the bathroom States she urinates frequently during the daytime as well.   Medications: Outpatient Medications Prior to Visit  Medication Sig   amLODipine (NORVASC) 2.5 MG tablet Take 1 tablet (2.5 mg total) by mouth daily.   apixaban (ELIQUIS) 5 MG TABS tablet Take 1 tablet (5 mg total) by mouth 2 (two) times daily.   No facility-administered medications prior to visit.    Review of Systems  Constitutional:  Negative for fatigue and fever.  Respiratory:  Negative for cough and shortness of breath.    Cardiovascular:  Positive for leg swelling. Negative for chest pain and palpitations.  Endocrine: Positive for polydipsia, polyphagia and polyuria.  Genitourinary:  Positive for frequency. Negative for decreased urine volume, difficulty urinating, dysuria, enuresis, hematuria and urgency.  Neurological:  Positive for numbness. Negative for dizziness, light-headedness and headaches.      Objective    BP 137/75 (BP Location: Left Arm, Patient Position: Sitting, Cuff Size: Normal)    Pulse 71    Temp (!) 97.5 F (36.4 C) (Oral)    Resp 16    Ht 5' (1.524 m)    Wt 147 lb (66.7 kg)    SpO2 97%    BMI 28.71 kg/m  {Show previous vital signs (optional):23777}  Physical Exam Vitals reviewed.  Constitutional:      General: She is awake.     Appearance: Normal appearance. She is well-developed, well-groomed and overweight.  HENT:     Head: Normocephalic and atraumatic.  Cardiovascular:     Rate and Rhythm: Normal rate and regular rhythm.     Pulses: Normal pulses.     Heart sounds: Normal heart sounds.  Pulmonary:     Effort: Pulmonary effort is normal.     Breath sounds: Normal breath sounds and air entry. No decreased breath sounds, wheezing, rhonchi or rales.  Musculoskeletal:     Right lower leg: No edema.     Left lower leg: No edema.  Neurological:     General: No focal deficit  present.     Mental Status: She is alert.     GCS: GCS eye subscore is 4. GCS verbal subscore is 5. GCS motor subscore is 6.  Psychiatric:        Attention and Perception: Attention and perception normal.        Mood and Affect: Mood and affect normal.        Speech: Speech normal.        Behavior: Behavior is cooperative.        Thought Content: Thought content normal.        Cognition and Memory: Cognition normal.      Results for orders placed or performed in visit on 07/02/21  POCT urinalysis dipstick  Result Value Ref Range   Color, UA Yellow    Clarity, UA clear    Glucose, UA Negative  Negative   Bilirubin, UA Negative    Ketones, UA Negative    Spec Grav, UA 1.010 1.010 - 1.025   Blood, UA Negative    pH, UA 6.0 5.0 - 8.0   Protein, UA Negative Negative   Urobilinogen, UA 0.2 0.2 or 1.0 E.U./dL   Nitrite, UA Negative    Leukocytes, UA Negative Negative   Appearance     Odor Negative     Assessment & Plan     Problem List Items Addressed This Visit       Cardiovascular and Mediastinum   BP (high blood pressure)    Chronic, historic condition  Appears Well managed with Amlodipine Continue current medication  Recommend patient measure BP at home until next follow up to ensure proper management  She reports lower extremity edema as a concern - may need to switch to another HTN medication if this continues       Relevant Orders   CBC w/Diff/Platelet   Lipid Profile     Other   Prediabetes    Chronic, historic concern A1c has not been checked since 11/2020- recheck as this may be cause of increased frequent urination and polydipsia Follow after results recommended       Relevant Orders   Comprehensive Metabolic Panel (CMET)   HgB A1c   Lipid Profile   Frequent urination - Primary    Chronic, historic concern, unmanaged  Unsure of etiology- currently ruling out diabetes mediated polyuria with labs May need referral to urology if lifestyle modifications are not successful at relieving symptoms Provided information to help with reducing frequent urination Patient was mildly hesitant to go to Urology so will refrain until other management strategies have failed       Relevant Orders   POCT urinalysis dipstick (Completed)   HgB A1c     No follow-ups on file.   I, Mazal Ebey E Zitlaly Malson, PA-C, have reviewed all documentation for this visit. The documentation on 07/02/21 for the exam, diagnosis, procedures, and orders are all accurate and complete.   Tinzlee Craker, Glennie [place] MPH [place], PA-C   [facility] [phone] (phone) [phone] (fax)  [place]

## 2021-07-06 DIAGNOSIS — R350 Frequency of micturition: Secondary | ICD-10-CM | POA: Diagnosis not present

## 2021-07-06 DIAGNOSIS — I10 Essential (primary) hypertension: Secondary | ICD-10-CM | POA: Diagnosis not present

## 2021-07-06 DIAGNOSIS — R7303 Prediabetes: Secondary | ICD-10-CM | POA: Diagnosis not present

## 2021-07-07 LAB — CBC WITH DIFFERENTIAL/PLATELET
Basophils Absolute: 0 10*3/uL (ref 0.0–0.2)
Basos: 1 %
EOS (ABSOLUTE): 0.1 10*3/uL (ref 0.0–0.4)
Eos: 2 %
Hematocrit: 44.1 % (ref 34.0–46.6)
Hemoglobin: 14.7 g/dL (ref 11.1–15.9)
Immature Grans (Abs): 0 10*3/uL (ref 0.0–0.1)
Immature Granulocytes: 0 %
Lymphocytes Absolute: 1.6 10*3/uL (ref 0.7–3.1)
Lymphs: 29 %
MCH: 31.4 pg (ref 26.6–33.0)
MCHC: 33.3 g/dL (ref 31.5–35.7)
MCV: 94 fL (ref 79–97)
Monocytes Absolute: 0.4 10*3/uL (ref 0.1–0.9)
Monocytes: 7 %
Neutrophils Absolute: 3.4 10*3/uL (ref 1.4–7.0)
Neutrophils: 61 %
Platelets: 255 10*3/uL (ref 150–450)
RBC: 4.68 x10E6/uL (ref 3.77–5.28)
RDW: 12.4 % (ref 11.7–15.4)
WBC: 5.5 10*3/uL (ref 3.4–10.8)

## 2021-07-07 LAB — COMPREHENSIVE METABOLIC PANEL
ALT: 9 IU/L (ref 0–32)
AST: 16 IU/L (ref 0–40)
Albumin/Globulin Ratio: 2.2 (ref 1.2–2.2)
Albumin: 4.4 g/dL (ref 3.5–4.6)
Alkaline Phosphatase: 77 IU/L (ref 44–121)
BUN/Creatinine Ratio: 18 (ref 12–28)
BUN: 15 mg/dL (ref 10–36)
Bilirubin Total: 0.5 mg/dL (ref 0.0–1.2)
CO2: 23 mmol/L (ref 20–29)
Calcium: 9.7 mg/dL (ref 8.7–10.3)
Chloride: 103 mmol/L (ref 96–106)
Creatinine, Ser: 0.84 mg/dL (ref 0.57–1.00)
Globulin, Total: 2 g/dL (ref 1.5–4.5)
Glucose: 107 mg/dL — ABNORMAL HIGH (ref 70–99)
Potassium: 4.6 mmol/L (ref 3.5–5.2)
Sodium: 139 mmol/L (ref 134–144)
Total Protein: 6.4 g/dL (ref 6.0–8.5)
eGFR: 66 mL/min/{1.73_m2} (ref >59)

## 2021-07-07 LAB — LIPID PANEL
Chol/HDL Ratio: 2.7 ratio (ref 0.0–4.4)
Cholesterol, Total: 219 mg/dL — ABNORMAL HIGH (ref 100–199)
HDL: 82 mg/dL (ref >39)
LDL Chol Calc (NIH): 123 mg/dL — ABNORMAL HIGH (ref 0–99)
Triglycerides: 82 mg/dL (ref 0–149)
VLDL Cholesterol Cal: 14 mg/dL (ref 5–40)

## 2021-07-07 LAB — HEMOGLOBIN A1C
Est. average glucose Bld gHb Est-mCnc: 131 mg/dL
Hgb A1c MFr Bld: 6.2 % — ABNORMAL HIGH (ref 4.8–5.6)

## 2021-07-13 ENCOUNTER — Ambulatory Visit: Payer: PPO | Admitting: Cardiology

## 2021-08-16 ENCOUNTER — Ambulatory Visit: Payer: PPO | Admitting: Neurology

## 2021-08-24 NOTE — Progress Notes (Signed)
?  ? ? ?Established patient visit ? ?I,[name],acting as a scribe for [name], MD.,have documented all relevant documentation on the behalf of [name], MD,as directed by  [name], MD while in the presence of [name], MD. ? ? ?Patient: Deanna Williams   DOB: 20-Sep-1928   88 y.o. Female  MRN: [id number] ?Visit Date: 08/25/2021 ? ?Today's healthcare provider: [name], MD  ? ?Chief Complaint  ?Patient presents with  ? Hand Pain  ? Leg Pain  ? ?Subjective  ?  ?HPI  ?Patient is a 88 year old female who presents to have her right hand and leg evaluated. Patient had a stroke one year ago. Patient states that since then she has begone to have pain numbness, and tingling in her right greater than left hand and her rightt leg. Patient states these symptoms have worsened recently. ? ?Patient also states she has been having a pressure/pain in her head for the past 6 months. Patient states pressure is not like a headache and feels very strange.  ? ?Patient has also been having increased urinary frequency, she is up at least 5-6 times a night. ? ?She is now living at [facility] and likes it. ?Health Maintenance ?Patient is due Td vaccine.  Her last PHQ screening/monitoring was on 07/02/21. ? ?Medications: ?Outpatient Medications Prior to Visit  ?Medication Sig  ? amLODipine (NORVASC) 2.5 MG tablet Take 1 tablet (2.5 mg total) by mouth daily.  ? apixaban (ELIQUIS) 5 MG TABS tablet Take 1 tablet (5 mg total) by mouth 2 (two) times daily.  ? Cholecalciferol (VITAMIN D3) 25 MCG (1000 UT) CAPS Take 1,000 Units by mouth daily.  ? ?No facility-administered medications prior to visit.  ? ? ?Review of Systems ? ?Last thyroid functions ?Lab Results  ?Component Value Date  ? TSH 3.110 12/03/2020  ? ?  ?  Objective  ?  ?BP 121/72 (BP Location: Left Arm, Patient Position: Sitting, Cuff Size: Normal)   Pulse 67   Resp 16   Wt 147 lb (66.7 kg)   SpO2 95%   BMI 28.71 kg/m?  ?BP  Readings from Last 3 Encounters:  ?08/25/21 121/72  ?07/02/21 137/75  ?02/15/21 (!) 155/78  ? ?Wt Readings from Last 3 Encounters:  ?08/25/21 147 lb (66.7 kg)  ?07/02/21 147 lb (66.7 kg)  ?02/15/21 147 lb (66.7 kg)  ? ?  ? ?Physical Exam ?Vitals reviewed.  ?Constitutional:   ?   Appearance: She is well-developed.  ?HENT:  ?   Head: Normocephalic and atraumatic.  ?   Right Ear: External ear normal.  ?   Left Ear: External ear normal.  ?   Nose: Nose normal.  ?Eyes:  ?   General: No scleral icterus. ?   Conjunctiva/sclera: Conjunctivae normal.  ?   Pupils: Pupils are equal, round, and reactive to light.  ?Neck:  ?   Thyroid: No thyromegaly.  ?Cardiovascular:  ?   Rate and Rhythm: Normal rate and regular rhythm.  ?   Heart sounds: Normal heart sounds.  ?Pulmonary:  ?   Effort: Pulmonary effort is normal.  ?   Breath sounds: Normal breath sounds.  ?Abdominal:  ?   Palpations: Abdomen is soft.  ?Lymphadenopathy:  ?   Cervical: No cervical adenopathy.  ?Skin: ?   General: Skin is warm and dry.  ?Neurological:  ?   Mental Status: She is alert and oriented to person, place, and time. Mental status is at  baseline.  ?Psychiatric:     ?   Mood and Affect: Mood normal.     ?   Behavior: Behavior normal.     ?   Thought Content: Thought content normal.     ?   Judgment: Judgment normal.  ?  ? ? ?No results found for any visits on 08/25/21. ? Assessment & Plan  ?  ? ?1. Cerebrovascular accident (CVA) due to embolism of left posterior cerebral artery ([place]) ?Clinically patient is able to continue to have good quality of life overall. ? ?2. Atrial fibrillation, unspecified type ([place]) ?On Eliquis. ? ?3. Age-related osteoporosis without current pathological fracture ?Possible BMD later this year. ? ?4. S/P angioplasty with PCA stent ? ? ?5. Hyperglycemia ? ? ?6. Hypercholesterolemia ? ? ?7. Bilateral carpal tunnel syndrome ?No referral at this time.  He has seen Ortho in the past for this. ?Consider orthopedic /hand surgery  referral ? ?8. Pain of lower extremity, unspecified laterality ?At this time try gabapentin 100 mg at night.  This appears to be neuropathic rather than orthopedic or muscular.  Follow-up 2 to 3 months. ? ? ?No follow-ups on file.  ?   ? ?I, [name], MD, have reviewed all documentation for this visit. The documentation on 08/30/21 for the exam, diagnosis, procedures, and orders are all accurate and complete. ? ? ? ?Demetress Tift Cranford Mon, MD  ?[facility] ?[phone] (phone) ?[phone] (fax) ? ?[facility] ?

## 2021-08-25 ENCOUNTER — Encounter: Payer: Self-pay | Admitting: Family Medicine

## 2021-08-25 ENCOUNTER — Ambulatory Visit (INDEPENDENT_AMBULATORY_CARE_PROVIDER_SITE_OTHER): Payer: PPO | Admitting: Family Medicine

## 2021-08-25 VITALS — BP 121/72 | HR 67 | Resp 16 | Wt 147.0 lb

## 2021-08-25 DIAGNOSIS — I63432 Cerebral infarction due to embolism of left posterior cerebral artery: Secondary | ICD-10-CM

## 2021-08-25 DIAGNOSIS — G5603 Carpal tunnel syndrome, bilateral upper limbs: Secondary | ICD-10-CM | POA: Diagnosis not present

## 2021-08-25 DIAGNOSIS — Z95820 Peripheral vascular angioplasty status with implants and grafts: Secondary | ICD-10-CM

## 2021-08-25 DIAGNOSIS — I4891 Unspecified atrial fibrillation: Secondary | ICD-10-CM | POA: Diagnosis not present

## 2021-08-25 DIAGNOSIS — E7800 Pure hypercholesterolemia, unspecified: Secondary | ICD-10-CM | POA: Diagnosis not present

## 2021-08-25 DIAGNOSIS — M79606 Pain in leg, unspecified: Secondary | ICD-10-CM

## 2021-08-25 DIAGNOSIS — R739 Hyperglycemia, unspecified: Secondary | ICD-10-CM

## 2021-08-25 DIAGNOSIS — M810 Age-related osteoporosis without current pathological fracture: Secondary | ICD-10-CM | POA: Diagnosis not present

## 2021-08-25 MED ORDER — GABAPENTIN 100 MG PO CAPS: 100.0000 mg | ORAL_CAPSULE | Freq: Every day | ORAL | 5 refills | Status: DC

## 2021-09-08 ENCOUNTER — Emergency Department
Admission: EM | Admit: 2021-09-08 | Discharge: 2021-09-08 | Disposition: A | Discharge status: Home / Self Care | Payer: PPO | Attending: Emergency Medicine | Admitting: Emergency Medicine

## 2021-09-08 ENCOUNTER — Emergency Department: Payer: PPO

## 2021-09-08 ENCOUNTER — Encounter: Payer: Self-pay | Admitting: Emergency Medicine

## 2021-09-08 ENCOUNTER — Other Ambulatory Visit: Payer: Self-pay

## 2021-09-08 DIAGNOSIS — R350 Frequency of micturition: Secondary | ICD-10-CM | POA: Diagnosis not present

## 2021-09-08 DIAGNOSIS — R42 Dizziness and giddiness: Secondary | ICD-10-CM | POA: Diagnosis not present

## 2021-09-08 DIAGNOSIS — R5381 Other malaise: Secondary | ICD-10-CM | POA: Diagnosis not present

## 2021-09-08 DIAGNOSIS — G9389 Other specified disorders of brain: Secondary | ICD-10-CM | POA: Diagnosis not present

## 2021-09-08 DIAGNOSIS — I6611 Occlusion and stenosis of right anterior cerebral artery: Secondary | ICD-10-CM | POA: Diagnosis not present

## 2021-09-08 DIAGNOSIS — I639 Cerebral infarction, unspecified: Secondary | ICD-10-CM | POA: Diagnosis not present

## 2021-09-08 DIAGNOSIS — I482 Chronic atrial fibrillation, unspecified: Secondary | ICD-10-CM | POA: Insufficient documentation

## 2021-09-08 DIAGNOSIS — R110 Nausea: Secondary | ICD-10-CM | POA: Diagnosis not present

## 2021-09-08 DIAGNOSIS — Z955 Presence of coronary angioplasty implant and graft: Secondary | ICD-10-CM | POA: Insufficient documentation

## 2021-09-08 DIAGNOSIS — I619 Nontraumatic intracerebral hemorrhage, unspecified: Secondary | ICD-10-CM | POA: Diagnosis not present

## 2021-09-08 DIAGNOSIS — Z7901 Long term (current) use of anticoagulants: Secondary | ICD-10-CM | POA: Insufficient documentation

## 2021-09-08 DIAGNOSIS — I2510 Atherosclerotic heart disease of native coronary artery without angina pectoris: Secondary | ICD-10-CM | POA: Insufficient documentation

## 2021-09-08 DIAGNOSIS — I672 Cerebral atherosclerosis: Secondary | ICD-10-CM | POA: Diagnosis not present

## 2021-09-08 DIAGNOSIS — I6503 Occlusion and stenosis of bilateral vertebral arteries: Secondary | ICD-10-CM | POA: Diagnosis not present

## 2021-09-08 DIAGNOSIS — I6602 Occlusion and stenosis of left middle cerebral artery: Secondary | ICD-10-CM | POA: Diagnosis not present

## 2021-09-08 DIAGNOSIS — I10 Essential (primary) hypertension: Secondary | ICD-10-CM | POA: Insufficient documentation

## 2021-09-08 DIAGNOSIS — R202 Paresthesia of skin: Secondary | ICD-10-CM | POA: Diagnosis not present

## 2021-09-08 LAB — PROTIME-INR
INR: 1.1 (ref 0.8–1.2)
Prothrombin Time: 14.3 seconds (ref 11.4–15.2)

## 2021-09-08 LAB — CBC WITH DIFFERENTIAL/PLATELET
Abs Immature Granulocytes: 0.02 10*3/uL (ref 0.00–0.07)
Basophils Absolute: 0 10*3/uL (ref 0.0–0.1)
Basophils Relative: 0 %
Eosinophils Absolute: 0.1 10*3/uL (ref 0.0–0.5)
Eosinophils Relative: 2 %
HCT: 41.8 % (ref 36.0–46.0)
Hemoglobin: 13.7 g/dL (ref 12.0–15.0)
Immature Granulocytes: 0 %
Lymphocytes Relative: 28 %
Lymphs Abs: 1.4 10*3/uL (ref 0.7–4.0)
MCH: 30.9 pg (ref 26.0–34.0)
MCHC: 32.8 g/dL (ref 30.0–36.0)
MCV: 94.4 fL (ref 80.0–100.0)
Monocytes Absolute: 0.4 10*3/uL (ref 0.1–1.0)
Monocytes Relative: 7 %
Neutro Abs: 3.1 10*3/uL (ref 1.7–7.7)
Neutrophils Relative %: 63 %
Platelets: 223 10*3/uL (ref 150–400)
RBC: 4.43 MIL/uL (ref 3.87–5.11)
RDW: 12.4 % (ref 11.5–15.5)
WBC: 5 10*3/uL (ref 4.0–10.5)
nRBC: 0 % (ref 0.0–0.2)

## 2021-09-08 LAB — URINALYSIS, COMPLETE (UACMP) WITH MICROSCOPIC
Bilirubin Urine: NEGATIVE
Glucose, UA: NEGATIVE mg/dL
Hgb urine dipstick: NEGATIVE
Ketones, ur: NEGATIVE mg/dL
Leukocytes,Ua: NEGATIVE
Nitrite: NEGATIVE
Protein, ur: NEGATIVE mg/dL
Specific Gravity, Urine: 1.01 (ref 1.005–1.030)
pH: 7 (ref 5.0–8.0)

## 2021-09-08 LAB — COMPREHENSIVE METABOLIC PANEL
ALT: 11 U/L (ref 0–44)
AST: 16 U/L (ref 15–41)
Albumin: 4.1 g/dL (ref 3.5–5.0)
Alkaline Phosphatase: 59 U/L (ref 38–126)
Anion gap: 7 (ref 5–15)
BUN: 18 mg/dL (ref 8–23)
CO2: 24 mmol/L (ref 22–32)
Calcium: 9.2 mg/dL (ref 8.9–10.3)
Chloride: 107 mmol/L (ref 98–111)
Creatinine, Ser: 0.86 mg/dL (ref 0.44–1.00)
GFR, Estimated: >60 mL/min (ref >60)
Glucose, Bld: 107 mg/dL — ABNORMAL HIGH (ref 70–99)
Potassium: 3.7 mmol/L (ref 3.5–5.1)
Sodium: 138 mmol/L (ref 135–145)
Total Bilirubin: 0.6 mg/dL (ref 0.3–1.2)
Total Protein: 6.6 g/dL (ref 6.5–8.1)

## 2021-09-08 LAB — APTT: aPTT: 42 seconds — ABNORMAL HIGH (ref 24–36)

## 2021-09-08 LAB — LIPASE, BLOOD: Lipase: 27 U/L (ref 11–51)

## 2021-09-08 MED ORDER — IOHEXOL 300 MG/ML  SOLN: 75.0000 mL | Freq: Once | INTRAMUSCULAR | Status: DC | PRN

## 2021-09-08 MED ORDER — ONDANSETRON 4 MG PO TBDP: 4.0000 mg | ORAL_TABLET | Freq: Three times a day (TID) | ORAL | 0 refills | Status: AC | PRN

## 2021-09-08 MED ORDER — MECLIZINE HCL 25 MG PO TABS
25.0000 mg | ORAL_TABLET | Freq: Once | ORAL | Status: AC
  Administered 2021-09-08: 25 mg via ORAL
  Filled 2021-09-08: qty 1

## 2021-09-08 MED ORDER — ONDANSETRON HCL 4 MG/2ML IJ SOLN
4.0000 mg | Freq: Once | INTRAMUSCULAR | Status: AC
  Administered 2021-09-08: 4 mg via INTRAVENOUS
  Filled 2021-09-08: qty 2

## 2021-09-08 MED ORDER — IOHEXOL 350 MG/ML SOLN
75.0000 mL | Freq: Once | INTRAVENOUS | Status: AC | PRN
  Administered 2021-09-08: 75 mL via INTRAVENOUS

## 2021-09-08 NOTE — ED Notes (Signed)
Patient completing MRI screening at this time. 

## 2021-09-08 NOTE — ED Provider Notes (Signed)
? ?[facility] ?Provider Note ? ? ? Event Date/Time  ? First MD Initiated Contact with Patient 09/08/21 1653   ?  (approximate) ? ? ?History  ? ?Dizziness ? ? ?HPI ? ?Deanna Williams is a 88 y.o. female with a past medical history of a CVA with some residual right-sided paresthesias and some chronic pain, A-fib on Eliquis, osteoporosis, CAD, HDL, and carpal tunnel syndrome who presents for evaluation of dizziness.  It is associate with some nausea.  She states that started fairly suddenly around 2:30 PM today when she got up from the couch.  It has been persistent since then.  She is not sure if movement or anything else makes it better or worse.  She states this feels the same as when she had a previous stroke.  She denies any new extremity weakness numbness or tingling, vision changes, headache, earache, sore throat, chest pain, cough, shortness of breath vomiting or diarrhea.  She has had some urinary frequency over the last couple of days but no pain.  No other acute symptoms.  She has been compliant with her Eliquis. ? ?  ? ? ?Physical Exam  ?Triage Vital Signs: ?ED Triage Vitals [09/08/21 1703]  ?Enc Vitals Group  ?   BP (!) 177/87  ?   Pulse Rate 68  ?   Resp 18  ?   Temp 98.2 ?F (36.8 ?C)  ?   Temp Source Oral  ?   SpO2 94 %  ?   Weight 150 lb (68 kg)  ?   Height 5' (1.524 m)  ?   Head Circumference   ?   Peak Flow   ?   Pain Score 0  ?   Pain Loc   ?   Pain Edu?   ?   Excl. in [place]?   ? ? ?Most recent vital signs: ?Vitals:  ? 09/08/21 1833 09/08/21 1900  ?BP: (!) 188/88 (!) 175/77  ?Pulse: 79 67  ?Resp: 19 17  ?Temp:    ?SpO2: 93% 95%  ? ? ?General: Awake, no distress.  ?CV:  Good peripheral perfusion.  2+ radial pulses. ?Resp:  Normal effort.  Clear bilaterally. ?Abd:  No distention.  ?Other:  Cranial nerves II through XII grossly intact.  No pronator drift.  No finger dysmetria.  Symmetric 5/5 strength of all extremities.  Sensation intact to light touch in all extremities.   ? ? ? ?ED  Results / Procedures / Treatments  ?Labs ?(all labs ordered are listed, but only abnormal results are displayed) ?Labs Reviewed  ?COMPREHENSIVE METABOLIC PANEL - Abnormal; Notable for the following components:  ?    Result Value  ? Glucose, Bld 107 (*)   ? All other components within normal limits  ?URINALYSIS, COMPLETE (UACMP) WITH MICROSCOPIC - Abnormal; Notable for the following components:  ? Color, Urine STRAW (*)   ? APPearance HAZY (*)   ? Bacteria, UA RARE (*)   ? All other components within normal limits  ?APTT - Abnormal; Notable for the following components:  ? aPTT 42 (*)   ? All other components within normal limits  ?CBC WITH DIFFERENTIAL/PLATELET  ?LIPASE, BLOOD  ?PROTIME-INR  ? ? ? ?EKG ? ?ECG is remarkable for sinus rhythm with a ventricular rate of 69, normal axis, unremarkable intervals with Q waves in V2 without other clear evidence of acute ischemia or significant arrhythmia. ? ? ?RADIOLOGY ? ?CTA head and neck, interpretation without evidence of hemorrhage, acute ischemia, edema  or acute large vessel occlusion.  I reviewed radiology interpretation and agree with their findings of chronic ischemic changes in the white matter and left hippocampus as well as moderate stenosis of the distal left vertebral artery and mild stenosis of the distal right vertebral artery as well as severe stenosis inferior left M2 branch unchanged from prior and moderate to severe stenosis at the right A2 segment unchanged from prior and left PCA stent that appears patent with some atresia distally. ? ?MR brain on my interpretation without evidence of any acute ischemia.  Also reviewed radiology interpretation. ? ?PROCEDURES: ? ?Critical Care performed: No ? ?.1-3 Lead EKG Interpretation ?Performed by: Lucrezia Starch, MD ?Authorized by: Lucrezia Starch, MD  ? ?  Interpretation: normal   ?  ECG rate assessment: normal   ?  Rhythm: sinus rhythm   ?  Ectopy: none   ?  Conduction: normal   ? ?The patient is on the  cardiac monitor to evaluate for evidence of arrhythmia and/or significant heart rate changes. ? ? ?MEDICATIONS ORDERED IN ED: ?Medications  ?ondansetron (ZOFRAN) injection 4 mg (4 mg Intravenous Given 09/08/21 1801)  ?meclizine (ANTIVERT) tablet 25 mg (25 mg Oral Given 09/08/21 1803)  ?iohexol (OMNIPAQUE) 350 MG/ML injection 75 mL (75 mLs Intravenous Contrast Given 09/08/21 1746)  ? ? ? ?IMPRESSION / MDM / ASSESSMENT AND PLAN / ED COURSE  ?I reviewed the triage vital signs and the nursing notes. ?             ?               ? ?Differential diagnosis includes, but is not limited to arrhythmia, metabolic derangements, CVA, peripheral vertigo possibly infectious process such as cystitis given she reports urinary frequency. ? ?EKG and history is not suggestive of an occlusion MI or acute ischemic cardiac event. ? ?CTA head and neck, interpretation without evidence of hemorrhage, acute ischemia, edema or acute large vessel occlusion.  I reviewed radiology interpretation and agree with their findings of chronic ischemic changes in the white matter and left hippocampus as well as moderate stenosis of the distal left vertebral artery and mild stenosis of the distal right vertebral artery as well as severe stenosis inferior left M2 branch unchanged from prior and moderate to severe stenosis at the right A2 segment unchanged from prior and left PCA stent that appears patent with some atresia distally. ? ?MR brain on my interpretation without evidence of any acute ischemia.  Also reviewed radiology interpretation. ? ?CBC without leukocytosis or acute anemia.  CMP without any significant electrolyte or metabolic derangements.  Lipase not suggestive of pancreatitis.  Coagulation studies unremarkable.  Urine has some rare bacteria but otherwise does not appear infected. ? ?Patient's nausea is much better and she is able to ambulate without assistance.  I suspect likely peripheral etiology for dizziness and nausea.  Over low  suspicion for other immediate life-threatening process and given absence of any neurological deficits patient feeling better and able tolerate p.o. I think she is stable for discharge with outpatient follow-up.  She did not think she had much benefit from meclizine so we will defer this although will write short course of Zofran.  Discussed having her blood pressure rechecked and follow-up visit.  Also advised to return to emergency room for any new or worsening symptoms.  Discharged in stable condition. ? ?  ? ? ?FINAL CLINICAL IMPRESSION(S) / ED DIAGNOSES  ? ?Final diagnoses:  ?Dizziness  ?Hypertension, unspecified type  ? ? ? ?  Rx / DC Orders  ? ?ED Discharge Orders   ? ?      Ordered  ?  ondansetron (ZOFRAN-ODT) 4 MG disintegrating tablet  Every 8 hours PRN       ? 09/08/21 2115  ? ?  ?  ? ?  ? ? ? ?Note:  This document was prepared using Dragon voice recognition software and may include unintentional dictation errors. ?  ?Lucrezia Starch, MD ?09/08/21 2117 ? ?

## 2021-09-08 NOTE — Discharge Instructions (Signed)
Please have your blood pressure rechecked at your next visit. ?

## 2021-09-08 NOTE — ED Triage Notes (Signed)
Patient to ED via [facility] from home for dizziness that started around 1430 today with nausea. Pt has hx of a stroke approx 1 year ago. Aox4. ?

## 2021-09-09 ENCOUNTER — Ambulatory Visit: Payer: Self-pay | Admitting: *Deleted

## 2021-09-09 NOTE — Telephone Encounter (Signed)
Summary: pt dizzy, not on phone,  incoming insurance agent alerting ER trip/ no med 4/25 CALL PT  ? Pt ins agent has called and pt not on line, states pt went to ER yesterday as she was dizzy and states that she was to get something for dizziness but did not receive anything, another script but was  only for nausea. Request the nurse FU with pt re her symptom. FU with pt after looking at notes [phone]   ?  ? ?Reason for Disposition ? [1] MODERATE dizziness (e.g., interferes with normal activities) AND [2] has NOT been evaluated by physician for this  (Exception: dizziness caused by heat exposure, sudden standing, or poor fluid intake) ? ?Answer Assessment - Initial Assessment Questions ?1. DESCRIPTION: "Describe your dizziness." ?    Feels lightheaded, feels faint with moving ?2. LIGHTHEADED: "Do you feel lightheaded?" (e.g., somewhat faint, woozy, weak upon standing) ?    Somewhat faint ?3. VERTIGO: "Do you feel like either you or the room is spinning or tilting?" (i.e. vertigo) ?     No- more swimmy headed ?4. SEVERITY: "How bad is it?"  "Do you feel like you are going to faint?" "Can you stand and walk?" ?  - MILD: Feels slightly dizzy, but walking normally. ?  - MODERATE: Feels unsteady when walking, but not falling; interferes with normal activities (e.g., school, work). ?  - SEVERE: Unable to walk without falling, or requires assistance to walk without falling; feels like passing out now.  ?    moderate ?5. ONSET:  "When did the dizziness begin?" ?    Patient had gone to grocery and started feeling dizzy when got home- yesterday ?6. AGGRAVATING FACTORS: "Does anything make it worse?" (e.g., standing, change in head position) ?    Getting up to move/walk makes it worse ?7. HEART RATE: "Can you tell me your heart rate?" "How many beats in 15 seconds?"  (Note: not all patients can do this)   ?    Was elevated yesterday- not checked today ?8. CAUSE: "What do you think is causing the dizziness?" ?    Not  sure ?9. RECURRENT SYMPTOM: "Have you had dizziness before?" If Yes, ask: "When was the last time?" "What happened that time?" ?    Yes- different from vertigo in past ?10. OTHER SYMPTOMS: "Do you have any other symptoms?" (e.g., fever, chest pain, vomiting, diarrhea, bleeding) ?      no ?11. PREGNANCY: "Is there any chance you are pregnant?" "When was your last menstrual period?" ? ?Protocols used: Dizziness - Lightheadedness-A-AH ? ?

## 2021-09-09 NOTE — Telephone Encounter (Signed)
?  Chief Complaint: dizziness- needs ED follow up ?Symptoms: dizziness- going to standing position ?Frequency: started yesterday ?Pertinent Negatives: Patient denies fever, chest pain, vomiting, diarrhea, bleeding ?Disposition: '[]'$ ED /'[]'$ Urgent Care (no appt availability in office) / '[]'$ Appointment(In office/virtual)/ '[]'$  [place] [facility]/ '[]'$ Home Care/ '[]'$ Refused Recommended Disposition /'[]'$ [facility]/ '[]'$  Follow-up with PCP ?Additional Notes: Patient was seen at ED yesterday and cleared to be released. Patient reports she is not much better with her dizziness and is requesting medication. Patient reports the Meclizine she was given did not really help that much. Patient has been scheduled for first available appointment- Monday with provider- but would like to come in before that if possible. Patient advised I would send message for provider review and see if appointment can be moved up ?

## 2021-09-13 ENCOUNTER — Ambulatory Visit (INDEPENDENT_AMBULATORY_CARE_PROVIDER_SITE_OTHER): Payer: PPO | Admitting: Family Medicine

## 2021-09-13 VITALS — BP 157/79 | HR 67 | Temp 97.6°F | Wt 147.0 lb

## 2021-09-13 DIAGNOSIS — I4891 Unspecified atrial fibrillation: Secondary | ICD-10-CM

## 2021-09-13 DIAGNOSIS — M810 Age-related osteoporosis without current pathological fracture: Secondary | ICD-10-CM

## 2021-09-13 DIAGNOSIS — I63432 Cerebral infarction due to embolism of left posterior cerebral artery: Secondary | ICD-10-CM

## 2021-09-13 DIAGNOSIS — I10 Essential (primary) hypertension: Secondary | ICD-10-CM | POA: Diagnosis not present

## 2021-09-13 DIAGNOSIS — R42 Dizziness and giddiness: Secondary | ICD-10-CM | POA: Diagnosis not present

## 2021-09-13 DIAGNOSIS — Z95820 Peripheral vascular angioplasty status with implants and grafts: Secondary | ICD-10-CM | POA: Diagnosis not present

## 2021-09-13 MED ORDER — AMLODIPINE BESYLATE 5 MG PO TABS: 5.0000 mg | ORAL_TABLET | Freq: Every day | ORAL | 1 refills | Status: DC

## 2021-09-13 NOTE — Progress Notes (Signed)
? ?I,Elena D DeSanto,acting as a scribe for [name], MD.,have documented all relevant documentation on the behalf of [name], MD,as directed by  [name], MD while in the presence of [name], MD. ?  ? ? ?Established patient visit ? ? ?Patient: Deanna Williams   DOB: [birth year]   88 y.o. Female  MRN: [id number] ?Visit Date: 09/13/2021 ? ?Today's healthcare provider: [name], MD  ? ?No chief complaint on file. ? ?Subjective  ?  ?HPI  ?Dizziness ?Patient is a 88 year old female who presents for evaluation of dizziness.  She states that due to history of stroke and the fact that her symptoms got so bad 2 nights ago she went to the emergency room. The diagnosed her with inner ear.  She states she has a history of vertigo but her recent symptoms were somewhat different. ?Patient states they gave her something for nausea in the ER and her nausea is gone.  She still has the dizziness but reports that some Dramamine has helped it slightly.   ?Dizziness problem is chronic since her stroke. ?Medications: ?Outpatient Medications Prior to Visit  ?Medication Sig  ? amLODipine (NORVASC) 2.5 MG tablet Take 1 tablet (2.5 mg total) by mouth daily.  ? apixaban (ELIQUIS) 5 MG TABS tablet Take 1 tablet (5 mg total) by mouth 2 (two) times daily.  ? Cholecalciferol (VITAMIN D3) 25 MCG (1000 UT) CAPS Take 1,000 Units by mouth daily.  ? gabapentin (NEURONTIN) 100 MG capsule Take 1 capsule (100 mg total) by mouth at bedtime.  ? ?No facility-administered medications prior to visit.  ? ? ?Review of Systems  ?Constitutional:  Positive for fatigue. Negative for fever.  ?HENT:  Negative for congestion, ear discharge, ear pain, postnasal drip, rhinorrhea, sinus pressure and sinus pain.   ?Eyes:  Negative for visual disturbance.  ?Neurological:  Positive for dizziness, light-headedness and headaches.  ? ?Last CBC ?Lab Results  ?Component Value Date  ? WBC 5.0 09/08/2021  ? HGB 13.7 09/08/2021  ?  HCT 41.8 09/08/2021  ? MCV 94.4 09/08/2021  ? MCH 30.9 09/08/2021  ? RDW 12.4 09/08/2021  ? PLT 223 09/08/2021  ? ?  ?  Objective  ?  ?BP (!) 157/79 (BP Location: Right Arm, Patient Position: Sitting, Cuff Size: Normal)   Pulse 67   Temp 97.6 ?F (36.4 ?C) (Oral)   Wt 147 lb (66.7 kg)   SpO2 93%   BMI 28.71 kg/m?  ?BP Readings from Last 3 Encounters:  ?09/13/21 (!) 157/79  ?09/08/21 (!) 156/78  ?08/25/21 121/72  ? ?Wt Readings from Last 3 Encounters:  ?09/13/21 147 lb (66.7 kg)  ?09/08/21 150 lb (68 kg)  ?08/25/21 147 lb (66.7 kg)  ? ?  ? ?Physical Exam ?Vitals reviewed.  ?Constitutional:   ?   Appearance: She is well-developed.  ?HENT:  ?   Head: Normocephalic and atraumatic.  ?   Right Ear: External ear normal.  ?   Left Ear: External ear normal.  ?   Nose: Nose normal.  ?Eyes:  ?   General: No scleral icterus. ?   Conjunctiva/sclera: Conjunctivae normal.  ?   Pupils: Pupils are equal, round, and reactive to light.  ?Neck:  ?   Thyroid: No thyromegaly.  ?Cardiovascular:  ?   Rate and Rhythm: Normal rate and regular rhythm.  ?   Heart sounds: Normal heart sounds.  ?Pulmonary:  ?   Effort: Pulmonary effort is normal.  ?  Breath sounds: Normal breath sounds.  ?Abdominal:  ?   Palpations: Abdomen is soft.  ?Lymphadenopathy:  ?   Cervical: No cervical adenopathy.  ?Skin: ?   General: Skin is warm and dry.  ?Neurological:  ?   Mental Status: She is alert and oriented to person, place, and time. Mental status is at baseline.  ?   Comments: No nystagmus she is able to ambulate with a slightly unsteady gait.  ?Psychiatric:     ?   Mood and Affect: Mood normal.     ?   Behavior: Behavior normal.     ?   Thought Content: Thought content normal.     ?   Judgment: Judgment normal.  ?  ? ? ?No results found for any visits on 09/13/21. ? Assessment & Plan  ?  ? ?1. Dizziness ?Pain mainly this is due to her CVA from last year.  Consider another physical therapy around going forward ?- amLODipine (NORVASC) 5 MG tablet; Take 1  tablet (5 mg total) by mouth daily.  Dispense: 90 tablet; Refill: 1 ? ?2. Primary hypertension ?Increase amlodipine from 2.5 to 5 mg daily ?- amLODipine (NORVASC) 5 MG tablet; Take 1 tablet (5 mg total) by mouth daily.  Dispense: 90 tablet; Refill: 1 ? ?3. Cerebrovascular accident (CVA) due to embolism of left posterior cerebral artery ([place]) ?All risk factors treated ? ?4. Atrial fibrillation, unspecified type ([place]) ? ? ?5. S/P angioplasty with PCA stent ?After CVA ? ?6. Age-related osteoporosis without current pathological fracture ? ? ? ?No follow-ups on file.  ?   ? ?I, [name], MD, have reviewed all documentation for this visit. The documentation on 09/15/21 for the exam, diagnosis, procedures, and orders are all accurate and complete. ? ? ? ?Devrin Monforte Cranford Mon, MD  ?[facility] ?[phone] (phone) ?[phone] (fax) ? ?[facility] ?

## 2021-10-19 DIAGNOSIS — M6281 Muscle weakness (generalized): Secondary | ICD-10-CM | POA: Diagnosis not present

## 2021-10-19 DIAGNOSIS — R262 Difficulty in walking, not elsewhere classified: Secondary | ICD-10-CM | POA: Diagnosis not present

## 2021-10-19 NOTE — Progress Notes (Unsigned)
     I,Sha'taria Leetta Hendriks,acting as a Education administrator for [employer], PA-C.,have documented all relevant documentation on the behalf of [name], PA-C,as directed by  [name], PA-C while in the presence of [name], PA-C.   Established patient visit   Patient: Deanna Williams   DOB: [birth year]   88 y.o. Female  MRN: [id number] Visit Date: 10/20/2021  Today's healthcare provider: [name], PA-C   No chief complaint on file.  Subjective    HPI  Patient reports being nauseated. Everything she eats makes her nauseous and having no appetite. States every time she sit down she goes to sleep. States that rx dr.gilbert prescribed (gabapentin) Is not working as well as she has constantly cold. Reports she has been feeling like this for about the last month now. Reports she has not taken anything when she is feeling nauseous. Fever yesterday when seeing physical therapist.   Medications: Outpatient Medications Prior to Visit  Medication Sig   amLODipine (NORVASC) 5 MG tablet Take 1 tablet (5 mg total) by mouth daily.   apixaban (ELIQUIS) 5 MG TABS tablet Take 1 tablet (5 mg total) by mouth 2 (two) times daily.   Cholecalciferol (VITAMIN D3) 25 MCG (1000 UT) CAPS Take 1,000 Units by mouth daily.   gabapentin (NEURONTIN) 100 MG capsule Take 1 capsule (100 mg total) by mouth at bedtime.   No facility-administered medications prior to visit.    Review of Systems  {Labs  Heme  Chem  Endocrine  Serology  Results Review (optional):23779}   Objective    There were no vitals taken for this visit. {Show previous vital signs (optional):23777}  Physical Exam  ***  No results found for any visits on 10/20/21.  Assessment & Plan     ***  No follow-ups on file.      {provider attestation***:1}   [name], PA-C  [facility] [phone] (phone) [phone] (fax)  [place]

## 2021-10-20 ENCOUNTER — Encounter: Payer: Self-pay | Admitting: Physician Assistant

## 2021-10-20 ENCOUNTER — Ambulatory Visit (INDEPENDENT_AMBULATORY_CARE_PROVIDER_SITE_OTHER): Payer: PPO | Admitting: Physician Assistant

## 2021-10-20 VITALS — BP 125/69 | HR 97 | Ht 62.0 in | Wt 145.8 lb

## 2021-10-20 DIAGNOSIS — R350 Frequency of micturition: Secondary | ICD-10-CM

## 2021-10-20 DIAGNOSIS — R509 Fever, unspecified: Secondary | ICD-10-CM | POA: Diagnosis not present

## 2021-10-20 DIAGNOSIS — N3001 Acute cystitis with hematuria: Secondary | ICD-10-CM

## 2021-10-20 LAB — POCT URINALYSIS DIPSTICK
Bilirubin, UA: NEGATIVE
Glucose, UA: NEGATIVE
Ketones, UA: NEGATIVE
Nitrite, UA: NEGATIVE
Protein, UA: POSITIVE — AB
Spec Grav, UA: 1.02 (ref 1.010–1.025)
Urobilinogen, UA: 2 E.U./dL — AB
pH, UA: 6 (ref 5.0–8.0)

## 2021-10-20 MED ORDER — SULFAMETHOXAZOLE-TRIMETHOPRIM 800-160 MG PO TABS: 1.0000 | ORAL_TABLET | Freq: Two times a day (BID) | ORAL | 0 refills | Status: DC

## 2021-10-20 NOTE — Progress Notes (Signed)
I,Sha'taria Tyson,acting as a Education administrator for [employer], PA-C.,have documented all relevant documentation on the behalf of Deanna Kirschner, PA-C,as directed by  Deanna Kirschner, PA-C while in the presence of Deanna Kirschner, PA-C.   Established patient visit   Patient: Deanna Williams   DOB: [birth year]   88 y.o. Female  MRN: [id number] Visit Date: 10/20/2021  Today's healthcare provider: [name], PA-C   Cc. Fever, nausea, fatigue   Subjective    HPI  Mayre is a 88 y/o female who presents today with fever yesterday at her PT of 54 F, nausea, no appetite, frequent urination, fatigue. Denies SOB, chest tightness, cough. Denies abdominal pain, back pain, dysuria.  O2 at PT was 92%.     Medications: Outpatient Medications Prior to Visit  Medication Sig   amLODipine (NORVASC) 5 MG tablet Take 1 tablet (5 mg total) by mouth daily.   apixaban (ELIQUIS) 5 MG TABS tablet Take 1 tablet (5 mg total) by mouth 2 (two) times daily.   Cholecalciferol (VITAMIN D3) 25 MCG (1000 UT) CAPS Take 1,000 Units by mouth daily.   gabapentin (NEURONTIN) 100 MG capsule Take 1 capsule (100 mg total) by mouth at bedtime.   No facility-administered medications prior to visit.    Review of Systems Review of Systems  Constitutional:  Positive for fever and malaise/fatigue.  Respiratory:  Negative for cough and shortness of breath.   Cardiovascular:  Negative for chest pain.  Gastrointestinal:  Negative for abdominal pain.  Genitourinary:  Positive for frequency. Negative for dysuria.      Objective    Blood pressure 125/69, pulse 97, height '5\' 2"'$  (1.575 m), weight 145 lb 12.8 oz (66.1 kg), SpO2 97 %.   Physical Exam  Physical Exam Constitutional:      Appearance: She is not ill-appearing.  Cardiovascular:     Rate and Rhythm: Normal rate and regular rhythm.     Pulses: Normal pulses.     Heart sounds: Normal heart sounds.  Pulmonary:     Effort: Pulmonary effort is normal. No respiratory  distress.     Breath sounds: Normal breath sounds. No wheezing, rhonchi or rales.  Abdominal:     General: Abdomen is flat.     Palpations: Abdomen is soft.     Tenderness: There is no abdominal tenderness. There is no right CVA tenderness, left CVA tenderness, guarding or rebound.  Skin:    General: Skin is warm.  Neurological:     Mental Status: She is oriented to person, place, and time.  Psychiatric:        Mood and Affect: Mood normal.        Behavior: Behavior normal.     Results for orders placed or performed in visit on 10/20/21  POCT Urinalysis Dipstick  Result Value Ref Range   Color, UA     Clarity, UA     Glucose, UA Negative Negative   Bilirubin, UA negative    Ketones, UA negative    Spec Grav, UA 1.020 1.010 - 1.025   Blood, UA Large    pH, UA 6.0 5.0 - 8.0   Protein, UA Positive (A) Negative   Urobilinogen, UA 2.0 (A) 0.2 or 1.0 E.U./dL   Nitrite, UA Negative    Leukocytes, UA Moderate (2+) (A) Negative   Appearance     Odor      Assessment & Plan     Acute cystitis UA + leuk, blood. Sent for culture.will get cmp/cbc d/t  fever and nausea Rx bactrim ds pobid x 7 days. Advised increase fluids If any SOB, worsening fever, fatigue, n/v please call office  F/u with PCP as scheduled.   I, Deanna Kirschner, PA-C have reviewed all documentation for this visit. The documentation on  10/20/2021  for the exam, diagnosis, procedures, and orders are all accurate and complete.  Deanna Kirschner, PA-C [facility] [address] [place], [place], [address] Office: [phone] Fax: [place]

## 2021-10-21 LAB — COMPREHENSIVE METABOLIC PANEL
ALT: 22 IU/L (ref 0–32)
AST: 25 IU/L (ref 0–40)
Albumin/Globulin Ratio: 1.6 (ref 1.2–2.2)
Albumin: 3.9 g/dL (ref 3.5–4.6)
Alkaline Phosphatase: 77 IU/L (ref 44–121)
BUN/Creatinine Ratio: 18 (ref 12–28)
BUN: 18 mg/dL (ref 10–36)
Bilirubin Total: 0.8 mg/dL (ref 0.0–1.2)
CO2: 22 mmol/L (ref 20–29)
Calcium: 8.9 mg/dL (ref 8.7–10.3)
Chloride: 97 mmol/L (ref 96–106)
Creatinine, Ser: 1.01 mg/dL — ABNORMAL HIGH (ref 0.57–1.00)
Globulin, Total: 2.4 g/dL (ref 1.5–4.5)
Glucose: 131 mg/dL — ABNORMAL HIGH (ref 70–99)
Potassium: 4.4 mmol/L (ref 3.5–5.2)
Sodium: 133 mmol/L — ABNORMAL LOW (ref 134–144)
Total Protein: 6.3 g/dL (ref 6.0–8.5)
eGFR: 53 mL/min/{1.73_m2} — ABNORMAL LOW (ref >59)

## 2021-10-21 LAB — CBC WITH DIFFERENTIAL/PLATELET
Basophils Absolute: 0 10*3/uL (ref 0.0–0.2)
Basos: 0 %
EOS (ABSOLUTE): 0 10*3/uL (ref 0.0–0.4)
Eos: 0 %
Hematocrit: 38.8 % (ref 34.0–46.6)
Hemoglobin: 13.2 g/dL (ref 11.1–15.9)
Immature Grans (Abs): 0 10*3/uL (ref 0.0–0.1)
Immature Granulocytes: 0 %
Lymphocytes Absolute: 0.8 10*3/uL (ref 0.7–3.1)
Lymphs: 9 %
MCH: 31.5 pg (ref 26.6–33.0)
MCHC: 34 g/dL (ref 31.5–35.7)
MCV: 93 fL (ref 79–97)
Monocytes Absolute: 1 10*3/uL — ABNORMAL HIGH (ref 0.1–0.9)
Monocytes: 11 %
Neutrophils Absolute: 7.1 10*3/uL — ABNORMAL HIGH (ref 1.4–7.0)
Neutrophils: 80 %
Platelets: 217 10*3/uL (ref 150–450)
RBC: 4.19 x10E6/uL (ref 3.77–5.28)
RDW: 11.9 % (ref 11.7–15.4)
WBC: 8.9 10*3/uL (ref 3.4–10.8)

## 2021-10-23 LAB — URINE CULTURE

## 2021-10-23 LAB — SPECIMEN STATUS REPORT

## 2021-10-26 ENCOUNTER — Ambulatory Visit (INDEPENDENT_AMBULATORY_CARE_PROVIDER_SITE_OTHER): Payer: PPO | Admitting: Family Medicine

## 2021-10-26 ENCOUNTER — Encounter: Payer: Self-pay | Admitting: Family Medicine

## 2021-10-26 VITALS — BP 120/80 | HR 64 | Resp 16 | Wt 142.0 lb

## 2021-10-26 DIAGNOSIS — I4891 Unspecified atrial fibrillation: Secondary | ICD-10-CM | POA: Diagnosis not present

## 2021-10-26 DIAGNOSIS — M810 Age-related osteoporosis without current pathological fracture: Secondary | ICD-10-CM | POA: Diagnosis not present

## 2021-10-26 DIAGNOSIS — I10 Essential (primary) hypertension: Secondary | ICD-10-CM

## 2021-10-26 DIAGNOSIS — M1990 Unspecified osteoarthritis, unspecified site: Secondary | ICD-10-CM

## 2021-10-26 DIAGNOSIS — Z8673 Personal history of transient ischemic attack (TIA), and cerebral infarction without residual deficits: Secondary | ICD-10-CM | POA: Diagnosis not present

## 2021-10-26 DIAGNOSIS — I63432 Cerebral infarction due to embolism of left posterior cerebral artery: Secondary | ICD-10-CM | POA: Diagnosis not present

## 2021-10-26 DIAGNOSIS — E7800 Pure hypercholesterolemia, unspecified: Secondary | ICD-10-CM | POA: Diagnosis not present

## 2021-10-26 DIAGNOSIS — R350 Frequency of micturition: Secondary | ICD-10-CM

## 2021-10-26 DIAGNOSIS — Z95820 Peripheral vascular angioplasty status with implants and grafts: Secondary | ICD-10-CM

## 2021-10-26 LAB — POCT URINALYSIS DIPSTICK
Bilirubin, UA: NEGATIVE
Blood, UA: NEGATIVE
Glucose, UA: NEGATIVE
Ketones, UA: NEGATIVE
Nitrite, UA: NEGATIVE
Protein, UA: POSITIVE — AB
Spec Grav, UA: <=1.005 — AB (ref 1.010–1.025)
Urobilinogen, UA: 0.2 E.U./dL
pH, UA: 7.5 (ref 5.0–8.0)

## 2021-10-26 NOTE — Progress Notes (Unsigned)
Established patient visit  I,[name],acting as a scribe for [name], MD.,have documented all relevant documentation on the behalf of [name], MD,as directed by  [name], MD while in the presence of [name], MD.   Patient: Deanna Williams   DOB: Jul 04, 1928   88 y.o. Female  MRN: [id number] Visit Date: 10/26/2021  Today's healthcare provider: [name], MD   Chief Complaint  Patient presents with   Follow-up   Hypertension   Subjective    HPI  Patient comes in today for follow-up.  Her urinary tract infection has really made her feel bad but she is now finally better.  Her only remaining complaint is urinary frequency.  She does however have some discomfort in the right CVA area.  She has a Lidoderm patch on this. She continues to have right leg and right hand pain with the leg pain aggravated by weightbearing.  The gabapentin did not help.  She walks with a walker at home and a cane in the office today. Hypertension, follow-up  BP Readings from Last 3 Encounters:  10/26/21 120/80  10/20/21 125/69  09/13/21 (!) 157/79   Wt Readings from Last 3 Encounters:  10/26/21 142 lb (64.4 kg)  10/20/21 145 lb 12.8 oz (66.1 kg)  09/13/21 147 lb (66.7 kg)     She was last seen for hypertension 1 months ago.  Management since that visit includes; Increased amlodipine from 2.5 to 5 mg daily.  Outside blood pressures are not checking.  Pertinent labs Lab Results  Component Value Date   CHOL 219 (H) 07/06/2021   HDL 82 07/06/2021   LDLCALC 123 (H) 07/06/2021   TRIG 82 07/06/2021   CHOLHDL 2.7 07/06/2021   Lab Results  Component Value Date   NA 133 (L) 10/20/2021   K 4.4 10/20/2021   CREATININE 1.01 (H) 10/20/2021   EGFR 53 (L) 10/20/2021   GLUCOSE 131 (H) 10/20/2021   TSH 3.110 12/03/2020     The ASCVD Risk score (Arnett DK, et al., 2019) failed to calculate for the following reasons:   The 2019 ASCVD risk score is  only valid for ages 90 to 6   The patient has a prior MI or stroke diagnosis  ---------------------------------------------------------------------------------------------------   Medications: Outpatient Medications Prior to Visit  Medication Sig   amLODipine (NORVASC) 5 MG tablet Take 1 tablet (5 mg total) by mouth daily.   apixaban (ELIQUIS) 5 MG TABS tablet Take 1 tablet (5 mg total) by mouth 2 (two) times daily.   Cholecalciferol (VITAMIN D3) 25 MCG (1000 UT) CAPS Take 1,000 Units by mouth daily.   sulfamethoxazole-trimethoprim (BACTRIM DS) 800-160 MG tablet Take 1 tablet by mouth 2 (two) times daily.   gabapentin (NEURONTIN) 100 MG capsule Take 1 capsule (100 mg total) by mouth at bedtime. (Patient not taking: Reported on 10/26/2021)   No facility-administered medications prior to visit.    Review of Systems  Constitutional:  Negative for appetite change, chills, fatigue and fever.  Respiratory:  Negative for chest tightness and shortness of breath.   Cardiovascular:  Negative for chest pain and palpitations.  Gastrointestinal:  Negative for abdominal pain, nausea and vomiting.  Neurological:  Negative for dizziness and weakness.    {Labs  Heme  Chem  Endocrine  Serology  Results Review (optional):23779}   Objective    BP 120/80 (BP Location: Right Arm, Patient Position: Sitting, Cuff Size: Normal)   Pulse 64  Resp 16   Wt 142 lb (64.4 kg)   SpO2 98%   BMI 25.97 kg/m  {Show previous vital signs (optional):23777}  Physical Exam  ***  Results for orders placed or performed in visit on 10/26/21  POCT urinalysis dipstick  Result Value Ref Range   Color, UA Yellow    Clarity, UA Clear    Glucose, UA Negative Negative   Bilirubin, UA Negative    Ketones, UA Negative    Spec Grav, UA <=1.005 (A) 1.010 - 1.025   Blood, UA Negative    pH, UA 7.5 5.0 - 8.0   Protein, UA Positive (A) Negative   Urobilinogen, UA 0.2 0.2 or 1.0 E.U./dL   Nitrite, UA Negative     Leukocytes, UA Trace (A) Negative    Assessment & Plan     1. Primary hypertension   2. Urine frequency  - POCT urinalysis dipstick--Abnormal - CULTURE, URINE COMPREHENSIVE   Return in about 2 months (around 12/26/2021).      {provider attestation***:1}   [name], MD  [facility] [phone] (phone) [phone] (fax)  [place]

## 2021-10-29 ENCOUNTER — Telehealth: Payer: Self-pay | Admitting: *Deleted

## 2021-10-29 DIAGNOSIS — N3001 Acute cystitis with hematuria: Secondary | ICD-10-CM

## 2021-10-29 LAB — CULTURE, URINE COMPREHENSIVE

## 2021-10-29 MED ORDER — SULFAMETHOXAZOLE-TRIMETHOPRIM 800-160 MG PO TABS: 1.0000 | ORAL_TABLET | Freq: Two times a day (BID) | ORAL | 0 refills | Status: DC

## 2021-10-29 NOTE — Telephone Encounter (Signed)
So far culture is negative. Is still incubating.

## 2021-10-29 NOTE — Telephone Encounter (Signed)
Patient was advised of results. Patient states she is still having frequency. She is going to the bathroom often. Patient wanted to know if she can get another round of antibiotics?

## 2021-10-29 NOTE — Telephone Encounter (Signed)
Copied from [place] [id number]. Topic: General - Other >> Oct 29, 2021  8:30 AM Leitha Schuller wrote: Reason for CRM: Patient checking status of 10-26-2021 lab results, please call patient once labs are resulted

## 2021-10-29 NOTE — Telephone Encounter (Signed)
Please advise patient's urine culture results?

## 2021-11-01 ENCOUNTER — Telehealth: Payer: Self-pay

## 2021-11-01 ENCOUNTER — Other Ambulatory Visit: Payer: Self-pay | Admitting: Family Medicine

## 2021-11-01 DIAGNOSIS — N3001 Acute cystitis with hematuria: Secondary | ICD-10-CM

## 2021-11-01 MED ORDER — SULFAMETHOXAZOLE-TRIMETHOPRIM 800-160 MG PO TABS: 1.0000 | ORAL_TABLET | Freq: Two times a day (BID) | ORAL | 0 refills | Status: DC

## 2021-11-01 NOTE — Telephone Encounter (Signed)
Pt had already been given results from April, [place]. Pt was wanting to know about results from culture done on 10/26/21. Advised her it showed no growth and d/t her still having symptoms, Dr. Rosanna Randy sent in another round of abx for her. Pt verbalized understanding and said she would go pu.

## 2021-11-01 NOTE — Telephone Encounter (Signed)
PT was wanting a CB from Friday, She has no transportation and was wanting another antibiotic/  pt still having symptoms and feet are a little swollen /Pls fu with pt today if possible at [phone]

## 2021-11-03 DIAGNOSIS — M6281 Muscle weakness (generalized): Secondary | ICD-10-CM | POA: Diagnosis not present

## 2021-11-03 DIAGNOSIS — R262 Difficulty in walking, not elsewhere classified: Secondary | ICD-10-CM | POA: Diagnosis not present

## 2021-11-05 DIAGNOSIS — M6281 Muscle weakness (generalized): Secondary | ICD-10-CM | POA: Diagnosis not present

## 2021-11-05 DIAGNOSIS — R262 Difficulty in walking, not elsewhere classified: Secondary | ICD-10-CM | POA: Diagnosis not present

## 2021-11-18 DIAGNOSIS — R262 Difficulty in walking, not elsewhere classified: Secondary | ICD-10-CM | POA: Diagnosis not present

## 2021-11-18 DIAGNOSIS — M6281 Muscle weakness (generalized): Secondary | ICD-10-CM | POA: Diagnosis not present

## 2021-11-24 DIAGNOSIS — M6281 Muscle weakness (generalized): Secondary | ICD-10-CM | POA: Diagnosis not present

## 2021-11-24 DIAGNOSIS — R262 Difficulty in walking, not elsewhere classified: Secondary | ICD-10-CM | POA: Diagnosis not present

## 2021-11-26 DIAGNOSIS — R262 Difficulty in walking, not elsewhere classified: Secondary | ICD-10-CM | POA: Diagnosis not present

## 2021-11-26 DIAGNOSIS — M6281 Muscle weakness (generalized): Secondary | ICD-10-CM | POA: Diagnosis not present

## 2021-11-29 ENCOUNTER — Ambulatory Visit: Payer: Self-pay | Admitting: *Deleted

## 2021-11-29 ENCOUNTER — Other Ambulatory Visit: Payer: Self-pay

## 2021-11-29 MED ORDER — NIRMATRELVIR/RITONAVIR (PAXLOVID) TABLET (RENAL DOSING): 2.0000 | ORAL_TABLET | Freq: Two times a day (BID) | ORAL | 0 refills | Status: AC

## 2021-11-29 NOTE — Telephone Encounter (Signed)
Spoke with patient, advised of home care and steps to take if symptoms worsen.  Renal dosing Paxlovid sent to pharmacy

## 2021-11-29 NOTE — Telephone Encounter (Signed)
Please call patient just to check on her and send in renal dose Paxlovid with GFR of 53.  Thank you

## 2021-11-29 NOTE — Telephone Encounter (Signed)
Summary: COVID Advice   Pt stated she tested positive for Covid and she has not heard back from anyone. Pt requests call back to advise of medication she can be prescribed. Cb# [phone]   ----- Message from Luciana Axe sent at 11/29/2021  9:38 AM EDT -----  Pt is calling to report that she tested positive for COVID today. Sx include sore throat, with coughing, night sweats, headache        Chief Complaint: positive covid test today requesting medication  Symptoms: sore throat, cough, stuffy nose, headache, night sweats ,chills, unknown temp.  Frequency: 11/27/21 - 3 days  Pertinent Negatives: Patient denies chest pain no difficulty breathing Disposition: '[]'$ ED /'[]'$ Urgent Care (no appt availability in office) / '[]'$ Appointment(In office/virtual)/ '[]'$  [place] [facility]/ '[]'$ Home Care/ '[]'$ Refused Recommended Disposition /'[]'$ [place] [facility]/ '[x]'$  Follow-up with PCP Additional Notes:   Patient did not want to make My Chart appt due to not knowing how to do My Chart visit. Transportation is issue for patient. Please advise if medication can be prescribed without OV.      Reason for Disposition  [1] HIGH RISK patient (e.g., weak immune system, age > 18 years, obesity with BMI 30 or higher, pregnant, chronic lung disease or other chronic medical condition) AND [2] COVID symptoms (e.g., cough, fever)  (Exceptions: Already seen by PCP and no new or worsening symptoms.)  Protocols used: Coronavirus (COVID-19) Diagnosed or Suspected-A-AH

## 2021-11-30 NOTE — Telephone Encounter (Signed)
Pt calling, states she has decided not to take Paxlovid "Afraid of the side effects. Requesting ATBs. Advised why ATBs would not be effective. Pt states she does feel better today. Asked pt if she would like Dr. Alben Spittle input, stated "No. I decided not to take the medication." Reviewed home care with pt and advised to call back for any worsening symptoms or questions. Pt verbalizes understanding.

## 2021-12-04 ENCOUNTER — Other Ambulatory Visit: Payer: Self-pay | Admitting: Family Medicine

## 2021-12-08 ENCOUNTER — Telehealth: Payer: Self-pay

## 2021-12-08 NOTE — Progress Notes (Unsigned)
[place] Ponder DeSanto,acting as a scribe for [name], MD.,have documented all relevant documentation on the behalf of [name], MD,as directed by  [name], MD while in the presence of [name], MD.    Established patient visit   Patient: Deanna Williams   DOB: Sep 03, 1928   88 y.o. Female  MRN: [id number] Visit Date: 12/09/2021  Today's healthcare provider: [name], MD   No chief complaint on file.  Subjective    HPI   Patient is a 88 year old female who presents with complaint of decreased energy since being diagnosed 2 weeks ago.  She denies any other associated symptoms at this time.  She is worried because she continues to test positive.  She has no shortness of breath, only fatigue.  She has had some night sweats over that this time that occur usually early in the morning.   Medications: Outpatient Medications Prior to Visit  Medication Sig   amLODipine (NORVASC) 5 MG tablet Take 1 tablet (5 mg total) by mouth daily.   Cholecalciferol (VITAMIN D3) 25 MCG (1000 UT) CAPS Take 1,000 Units by mouth daily.   ELIQUIS 5 MG TABS tablet TAKE 1 TABLET BY MOUTH TWICE A DAY   gabapentin (NEURONTIN) 100 MG capsule Take 1 capsule (100 mg total) by mouth at bedtime.   [DISCONTINUED] sulfamethoxazole-trimethoprim (BACTRIM DS) 800-160 MG tablet Take 1 tablet by mouth 2 (two) times daily.   No facility-administered medications prior to visit.    Review of Systems  Respiratory:  Negative for cough, shortness of breath and wheezing.   Cardiovascular:  Negative for chest pain and palpitations.    Last thyroid functions Lab Results  Component Value Date   TSH 3.110 12/03/2020       Objective    BP 121/72 (BP Location: Right Arm, Patient Position: Sitting, Cuff Size: Normal)   Pulse 64   Temp 97.7 F (36.5 C) (Oral)   Wt 140 lb (63.5 kg)   SpO2 95%   BMI 25.61 kg/m  BP Readings from Last 3 Encounters:  12/09/21 121/72  10/26/21  120/80  10/20/21 125/69   Wt Readings from Last 3 Encounters:  12/09/21 140 lb (63.5 kg)  10/26/21 142 lb (64.4 kg)  10/20/21 145 lb 12.8 oz (66.1 kg)      Physical Exam Vitals reviewed.  Constitutional:      General: She is not in acute distress.    Appearance: She is well-developed.  HENT:     Head: Normocephalic and atraumatic.     Right Ear: Hearing normal.     Left Ear: Hearing normal.     Nose: Nose normal.  Eyes:     General: Lids are normal. No scleral icterus.       Right eye: No discharge.        Left eye: No discharge.     Conjunctiva/sclera: Conjunctivae normal.  Cardiovascular:     Rate and Rhythm: Normal rate and regular rhythm.     Heart sounds: Normal heart sounds.  Pulmonary:     Effort: Pulmonary effort is normal. No respiratory distress.     Breath sounds: Normal breath sounds.  Skin:    Findings: No lesion or rash.  Neurological:     General: No focal deficit present.     Mental Status: She is alert and oriented to person, place, and time.  Psychiatric:        Mood and Affect: Mood normal.  Speech: Speech normal.        Behavior: Behavior normal.        Thought Content: Thought content normal.        Judgment: Judgment normal.       No results found for any visits on 12/09/21.  Assessment & Plan     1. COVID-19 Discussed with patient that this fatigue is the most common side effect.  She she declined Paxlovid when she initially was diagnosed due to side effects and I do not blame her.  I think she will do well with this but fatigue may be ongoing for a while. - DG Chest 2 View; Future  2. Walking pneumonia Get chest x-ray due to night sweats and the chronic fatigue.  If she has a secondary pneumonia we will treat with doxycycline - DG Chest 2 View; Future   No follow-ups on file.      I, [name], MD, have reviewed all documentation for this visit. The documentation on 12/09/21 for the exam, diagnosis, procedures, and  orders are all accurate and complete.    Jamichael Knotts Cranford Mon, MD  [facility] [phone] (phone) [phone] (fax)  [place]

## 2021-12-08 NOTE — Telephone Encounter (Signed)
Copied from [place]. Topic: Appointment Scheduling - Scheduling Inquiry for Clinic >> Dec 08, 2021  3:40 PM Tiffany B wrote: Reason for CRM: Patient has a 9:40 am appointment with PCP on 12/09/2021 and was dx with COVID on 11/27/2021, patient states she is no longer coughing and no sore throat. She states she just feels tired. Patient took another test on 725/2023 and COVID home test line was faint.

## 2021-12-09 ENCOUNTER — Ambulatory Visit: Payer: PPO | Admitting: Family Medicine

## 2021-12-09 ENCOUNTER — Ambulatory Visit (INDEPENDENT_AMBULATORY_CARE_PROVIDER_SITE_OTHER): Payer: PPO | Admitting: Family Medicine

## 2021-12-09 ENCOUNTER — Ambulatory Visit
Admission: RE | Admit: 2021-12-09 | Discharge: 2021-12-09 | Disposition: A | Discharge status: Home / Self Care | Payer: PPO | Attending: Family Medicine | Admitting: Family Medicine

## 2021-12-09 ENCOUNTER — Ambulatory Visit
Admission: RE | Admit: 2021-12-09 | Discharge: 2021-12-09 | Disposition: A | Discharge status: Home / Self Care | Payer: PPO | Source: Ambulatory Visit | Attending: Family Medicine | Admitting: Family Medicine

## 2021-12-09 VITALS — BP 121/72 | HR 64 | Temp 97.7°F | Wt 140.0 lb

## 2021-12-09 DIAGNOSIS — R059 Cough, unspecified: Secondary | ICD-10-CM | POA: Diagnosis not present

## 2021-12-09 DIAGNOSIS — U071 COVID-19: Secondary | ICD-10-CM | POA: Insufficient documentation

## 2021-12-09 DIAGNOSIS — J189 Pneumonia, unspecified organism: Secondary | ICD-10-CM

## 2021-12-09 DIAGNOSIS — R61 Generalized hyperhidrosis: Secondary | ICD-10-CM | POA: Diagnosis not present

## 2021-12-09 DIAGNOSIS — K449 Diaphragmatic hernia without obstruction or gangrene: Secondary | ICD-10-CM | POA: Diagnosis not present

## 2021-12-09 DIAGNOSIS — R5383 Other fatigue: Secondary | ICD-10-CM | POA: Diagnosis not present

## 2021-12-16 DIAGNOSIS — E559 Vitamin D deficiency, unspecified: Secondary | ICD-10-CM | POA: Diagnosis not present

## 2021-12-16 DIAGNOSIS — E663 Overweight: Secondary | ICD-10-CM | POA: Diagnosis not present

## 2021-12-16 DIAGNOSIS — Z7901 Long term (current) use of anticoagulants: Secondary | ICD-10-CM | POA: Diagnosis not present

## 2021-12-16 DIAGNOSIS — I672 Cerebral atherosclerosis: Secondary | ICD-10-CM | POA: Diagnosis not present

## 2021-12-16 DIAGNOSIS — I10 Essential (primary) hypertension: Secondary | ICD-10-CM | POA: Diagnosis not present

## 2021-12-16 DIAGNOSIS — I6529 Occlusion and stenosis of unspecified carotid artery: Secondary | ICD-10-CM | POA: Diagnosis not present

## 2021-12-21 ENCOUNTER — Ambulatory Visit: Payer: PPO | Admitting: Family Medicine

## 2021-12-22 ENCOUNTER — Ambulatory Visit: Payer: PPO | Admitting: Family Medicine

## 2021-12-29 ENCOUNTER — Encounter: Payer: Self-pay | Admitting: Family Medicine

## 2021-12-29 ENCOUNTER — Ambulatory Visit (INDEPENDENT_AMBULATORY_CARE_PROVIDER_SITE_OTHER): Payer: PPO | Admitting: Family Medicine

## 2021-12-29 VITALS — BP 124/64 | HR 74 | Resp 16 | Wt 145.0 lb

## 2021-12-29 DIAGNOSIS — R0989 Other specified symptoms and signs involving the circulatory and respiratory systems: Secondary | ICD-10-CM | POA: Diagnosis not present

## 2021-12-29 DIAGNOSIS — M810 Age-related osteoporosis without current pathological fracture: Secondary | ICD-10-CM

## 2021-12-29 DIAGNOSIS — E7800 Pure hypercholesterolemia, unspecified: Secondary | ICD-10-CM

## 2021-12-29 DIAGNOSIS — I4891 Unspecified atrial fibrillation: Secondary | ICD-10-CM

## 2021-12-29 DIAGNOSIS — I63532 Cerebral infarction due to unspecified occlusion or stenosis of left posterior cerebral artery: Secondary | ICD-10-CM

## 2021-12-29 NOTE — Progress Notes (Signed)
Established patient visit  I,[name],acting as a scribe for [name], MD.,have documented all relevant documentation on the behalf of [name], MD,as directed by  [name], MD while in the presence of [name], MD.   Patient: Deanna Williams   DOB: [birth year]   88 y.o. Female  MRN: [id number] Visit Date: 12/29/2021  Today's healthcare provider: [name], MD   No chief complaint on file.  Subjective    HPI  Patient is here concerning home visit she had recently.  She had a house call from her insurance nursing and was told she had a carotid bruit.  She has been asymptomatic for any cerebrovascular disease since her stroke and posterior stenting.  She is brought in today by her great granddaughter.  Patient continues to be doing well  Medications: Outpatient Medications Prior to Visit  Medication Sig   amLODipine (NORVASC) 5 MG tablet Take 1 tablet (5 mg total) by mouth daily.   Cholecalciferol (VITAMIN D3) 25 MCG (1000 UT) CAPS Take 1,000 Units by mouth daily.   ELIQUIS 5 MG TABS tablet TAKE 1 TABLET BY MOUTH TWICE A DAY   gabapentin (NEURONTIN) 100 MG capsule Take 1 capsule (100 mg total) by mouth at bedtime. (Patient not taking: Reported on 12/29/2021)   No facility-administered medications prior to visit.    Review of Systems  Constitutional:  Negative for appetite change, chills, fatigue and fever.  Respiratory:  Negative for chest tightness and shortness of breath.   Cardiovascular:  Negative for chest pain and palpitations.  Gastrointestinal:  Negative for abdominal pain, nausea and vomiting.  Neurological:  Negative for dizziness and weakness.    Last hemoglobin A1c Lab Results  Component Value Date   HGBA1C 6.2 (H) 07/06/2021       Objective    BP 124/64 (BP Location: Right Arm, Patient Position: Sitting, Cuff Size: Normal)   Pulse 74   Resp 16   Wt 145 lb (65.8 kg)   SpO2 98%   BMI 26.52 kg/m  BP  Readings from Last 3 Encounters:  12/29/21 124/64  12/09/21 121/72  10/26/21 120/80   Wt Readings from Last 3 Encounters:  12/29/21 145 lb (65.8 kg)  12/09/21 140 lb (63.5 kg)  10/26/21 142 lb (64.4 kg)      Physical Exam Vitals reviewed.  Constitutional:      General: She is not in acute distress.    Appearance: She is well-developed.  HENT:     Head: Normocephalic and atraumatic.     Right Ear: Hearing normal.     Left Ear: Hearing normal.     Nose: Nose normal.  Eyes:     General: Lids are normal. No scleral icterus.       Right eye: No discharge.        Left eye: No discharge.     Conjunctiva/sclera: Conjunctivae normal.  Neck:     Comments: No carotid bruit noted Cardiovascular:     Rate and Rhythm: Normal rate and regular rhythm.     Heart sounds: Normal heart sounds.  Pulmonary:     Effort: Pulmonary effort is normal. No respiratory distress.  Abdominal:     Palpations: Abdomen is soft.     Comments: No CVAT.  Skin:    General: Skin is warm and dry.     Findings: No lesion or rash.  Neurological:     General: No focal deficit present.  Mental Status: She is alert and oriented to person, place, and time.  Psychiatric:        Mood and Affect: Mood normal.        Speech: Speech normal.        Behavior: Behavior normal.        Thought Content: Thought content normal.        Judgment: Judgment normal.       No results found for any visits on 12/29/21.  Assessment & Plan     1. Bruit of left carotid artery Obtain carotid Dopplers. - US Carotid Duplex Left  2. Atrial fibrillation, unspecified type ([place]) On Eliquis.  3. 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 Age-related osteoporosis without current pathological fracture   5. Hypercholesterolemia Patient was intolerant of statins  No follow-ups on file.      I, [name], MD, have reviewed all documentation for this visit. The documentation on 01/03/22 for the exam, diagnosis,  procedures, and orders are all accurate and complete.    Ashlin Hidalgo Cranford Mon, MD  [facility] [phone] (phone) [phone] (fax)  [place]

## 2022-01-04 ENCOUNTER — Ambulatory Visit
Admission: RE | Admit: 2022-01-04 | Discharge: 2022-01-04 | Disposition: A | Discharge status: Home / Self Care | Payer: PPO | Source: Ambulatory Visit | Attending: Family Medicine | Admitting: Family Medicine

## 2022-01-04 DIAGNOSIS — R0989 Other specified symptoms and signs involving the circulatory and respiratory systems: Secondary | ICD-10-CM | POA: Insufficient documentation

## 2022-01-04 DIAGNOSIS — I10 Essential (primary) hypertension: Secondary | ICD-10-CM | POA: Diagnosis not present

## 2022-01-04 DIAGNOSIS — Z8673 Personal history of transient ischemic attack (TIA), and cerebral infarction without residual deficits: Secondary | ICD-10-CM | POA: Diagnosis not present

## 2022-01-04 DIAGNOSIS — I6523 Occlusion and stenosis of bilateral carotid arteries: Secondary | ICD-10-CM | POA: Diagnosis not present

## 2022-01-05 ENCOUNTER — Telehealth: Payer: Self-pay | Admitting: Family Medicine

## 2022-01-05 NOTE — Telephone Encounter (Signed)
Patient called in about results of ultrasound done on neck. Please call back

## 2022-01-06 ENCOUNTER — Telehealth: Payer: Self-pay | Admitting: *Deleted

## 2022-01-06 NOTE — Telephone Encounter (Signed)
Left VM to call back for results.

## 2022-01-06 NOTE — Telephone Encounter (Signed)
Pt returned call regarding imaging. Message from Dr. Rosanna Randy read to pt:  "Carotid arteries ok.  Please advise patient."  Pt verbalizes understanding.

## 2022-01-10 DIAGNOSIS — H2513 Age-related nuclear cataract, bilateral: Secondary | ICD-10-CM | POA: Diagnosis not present

## 2022-01-19 ENCOUNTER — Telehealth: Payer: Self-pay | Admitting: Family Medicine

## 2022-01-19 NOTE — Telephone Encounter (Signed)
Patient declined the Medicare Wellness Visit with [facility]   She would like to decline for this year  She stated that it would be ok to call her next year and see about scheduling

## 2022-01-19 NOTE — Telephone Encounter (Signed)
Copied from Central [id number]. Topic: Medicare AWV >> Jan 19, 2022 11:19 AM Jae Dire wrote: Reason for CRM:  Left message for patient to call back and schedule Medicare Annual Wellness Visit (AWV) in office.   If unable to come into the office for AWV,  please offer to do virtually or by telephone.  Last AWV:  01/26/2021  Please schedule at anytime with [facility] Health Advisor.  30 minute appointment for Virtual or phone 45 minute appointment for in office or Initial virtual/phone  Any questions, please contact me at [phone]

## 2022-03-03 ENCOUNTER — Other Ambulatory Visit: Payer: Self-pay

## 2022-03-03 ENCOUNTER — Emergency Department
Admission: EM | Admit: 2022-03-03 | Discharge: 2022-03-03 | Disposition: A | Discharge status: Skilled Nursing Facility | Payer: PPO | Attending: Emergency Medicine | Admitting: Emergency Medicine

## 2022-03-03 ENCOUNTER — Encounter: Payer: Self-pay | Admitting: Intensive Care

## 2022-03-03 ENCOUNTER — Emergency Department: Payer: PPO

## 2022-03-03 DIAGNOSIS — Z95828 Presence of other vascular implants and grafts: Secondary | ICD-10-CM | POA: Diagnosis not present

## 2022-03-03 DIAGNOSIS — I10 Essential (primary) hypertension: Secondary | ICD-10-CM | POA: Insufficient documentation

## 2022-03-03 DIAGNOSIS — Z20822 Contact with and (suspected) exposure to covid-19: Secondary | ICD-10-CM | POA: Diagnosis not present

## 2022-03-03 DIAGNOSIS — R110 Nausea: Secondary | ICD-10-CM | POA: Insufficient documentation

## 2022-03-03 DIAGNOSIS — R42 Dizziness and giddiness: Secondary | ICD-10-CM | POA: Insufficient documentation

## 2022-03-03 DIAGNOSIS — S0990XA Unspecified injury of head, initial encounter: Secondary | ICD-10-CM | POA: Diagnosis not present

## 2022-03-03 DIAGNOSIS — Z7901 Long term (current) use of anticoagulants: Secondary | ICD-10-CM | POA: Diagnosis not present

## 2022-03-03 DIAGNOSIS — R202 Paresthesia of skin: Secondary | ICD-10-CM | POA: Insufficient documentation

## 2022-03-03 DIAGNOSIS — R531 Weakness: Secondary | ICD-10-CM | POA: Diagnosis not present

## 2022-03-03 DIAGNOSIS — R519 Headache, unspecified: Secondary | ICD-10-CM | POA: Diagnosis not present

## 2022-03-03 DIAGNOSIS — M6281 Muscle weakness (generalized): Secondary | ICD-10-CM | POA: Diagnosis not present

## 2022-03-03 DIAGNOSIS — R200 Anesthesia of skin: Secondary | ICD-10-CM | POA: Diagnosis not present

## 2022-03-03 DIAGNOSIS — K449 Diaphragmatic hernia without obstruction or gangrene: Secondary | ICD-10-CM | POA: Diagnosis not present

## 2022-03-03 HISTORY — DX: Cerebral infarction, unspecified: I63.9

## 2022-03-03 HISTORY — DX: Unspecified atrial fibrillation: I48.91

## 2022-03-03 LAB — URINALYSIS, ROUTINE W REFLEX MICROSCOPIC
Bilirubin Urine: NEGATIVE
Glucose, UA: NEGATIVE mg/dL
Hgb urine dipstick: NEGATIVE
Ketones, ur: NEGATIVE mg/dL
Leukocytes,Ua: NEGATIVE
Nitrite: NEGATIVE
Protein, ur: NEGATIVE mg/dL
Specific Gravity, Urine: 1.011 (ref 1.005–1.030)
pH: 6 (ref 5.0–8.0)

## 2022-03-03 LAB — TSH: TSH: 2.886 u[IU]/mL (ref 0.350–4.500)

## 2022-03-03 LAB — BASIC METABOLIC PANEL
Anion gap: 5 (ref 5–15)
BUN: 15 mg/dL (ref 8–23)
CO2: 23 mmol/L (ref 22–32)
Calcium: 9.2 mg/dL (ref 8.9–10.3)
Chloride: 109 mmol/L (ref 98–111)
Creatinine, Ser: 0.84 mg/dL (ref 0.44–1.00)
GFR, Estimated: >60 mL/min (ref >60)
Glucose, Bld: 125 mg/dL — ABNORMAL HIGH (ref 70–99)
Potassium: 4.2 mmol/L (ref 3.5–5.1)
Sodium: 137 mmol/L (ref 135–145)

## 2022-03-03 LAB — RESP PANEL BY RT-PCR (FLU A&B, COVID) ARPGX2
Influenza A by PCR: NEGATIVE
Influenza B by PCR: NEGATIVE
SARS Coronavirus 2 by RT PCR: NEGATIVE

## 2022-03-03 LAB — CBC
HCT: 43.7 % (ref 36.0–46.0)
Hemoglobin: 14.2 g/dL (ref 12.0–15.0)
MCH: 30.6 pg (ref 26.0–34.0)
MCHC: 32.5 g/dL (ref 30.0–36.0)
MCV: 94.2 fL (ref 80.0–100.0)
Platelets: 221 10*3/uL (ref 150–400)
RBC: 4.64 MIL/uL (ref 3.87–5.11)
RDW: 12.2 % (ref 11.5–15.5)
WBC: 5.1 10*3/uL (ref 4.0–10.5)
nRBC: 0 % (ref 0.0–0.2)

## 2022-03-03 LAB — T4, FREE: Free T4: 0.71 ng/dL (ref 0.61–1.12)

## 2022-03-03 LAB — TROPONIN I (HIGH SENSITIVITY)
Troponin I (High Sensitivity): 8 ng/L (ref <18)
Troponin I (High Sensitivity): 8 ng/L (ref <18)

## 2022-03-03 NOTE — ED Provider Notes (Signed)
[facility] Provider Note    Event Date/Time   First MD Initiated Contact with Patient 03/03/22 1505     (approximate)   History   Weakness   HPI  Deanna Williams is a 88 y.o. female   Past medical history of CVA with residual mild right-sided deficit, A-fib on Eliquis, per lipidemia, hypertension, presents with an episode of nausea, weakness, lightheadedness, headache this morning.  She has intermittent episodes similar to this in the past, also some numbness and tingling in bilateral legs intermittently.  She denies chest pain, shortness of breath, vomiting, diarrhea or urinary symptoms.  She denies trauma or falls.  She is here with her grandson at bedside and resides in a senior living community independent living and is ambulatory at baseline with her walker.  History was obtained via the patient and review of external medical notes including emergency department visit in May 2023 with similar episode of dizziness with negative CTA head neck and MRI brain      Physical Exam   Triage Vital Signs: ED Triage Vitals  Enc Vitals Group     BP 03/03/22 0947 (!) 140/71     Pulse Rate 03/03/22 0947 70     Resp 03/03/22 0947 16     Temp 03/03/22 0947 98.3 F (36.8 C)     Temp Source 03/03/22 0947 Oral     SpO2 03/03/22 0947 96 %     Weight 03/03/22 0948 140 lb (63.5 kg)     Height 03/03/22 0948 '5\' 4"'$  (1.626 m)     Head Circumference --      Peak Flow --      Pain Score 03/03/22 0947 6     Pain Loc --      Pain Edu? --      Excl. in [place]? --     Most recent vital signs: Vitals:   03/03/22 0947 03/03/22 1530  BP: (!) 140/71 (!) 157/69  Pulse: 70 65  Resp: 16 15  Temp: 98.3 F (36.8 C)   SpO2: 96% 99%    General: Awake, no distress.  CV:  Good peripheral perfusion.  Resp:  Normal effort.  Clear lungs Abd:  No distention.  nonTender Other:  Focal neurological deficits including motor and sensory, facial asymmetry, mentation   ED  Results / Procedures / Treatments   Labs (all labs ordered are listed, but only abnormal results are displayed) Labs Reviewed  BASIC METABOLIC PANEL - Abnormal; Notable for the following components:      Result Value   Glucose, Bld 125 (*)    All other components within normal limits  URINALYSIS, ROUTINE W REFLEX MICROSCOPIC - Abnormal; Notable for the following components:   Color, Urine YELLOW (*)    APPearance CLEAR (*)    All other components within normal limits  RESP PANEL BY RT-PCR (FLU A&B, COVID) ARPGX2  CBC  TSH  T4, FREE  TROPONIN I (HIGH SENSITIVITY)  TROPONIN I (HIGH SENSITIVITY)     I reviewed labs and they are notable for urinalysis without bacteria or leukocytes, glucose 125, initial troponin 8  EKG  ED ECG REPORT I, Lucillie Garfinkel, the attending physician, personally viewed and interpreted this ECG.   Date: 03/03/2022  EKG Time: 1003  Rate: 65  Rhythm: normal EKG, normal sinus rhythm  Axis: nl  Intervals:qtc 420; no stemi     RADIOLOGY I independently reviewed and interpreted CT of the head without contrast and see no obvious  hemorrhage or midline shift   PROCEDURES:  Critical Care performed: No  Procedures   MEDICATIONS ORDERED IN ED: Medications - No data to display   IMPRESSION / MDM / [place] / ED COURSE  I reviewed the triage vital signs and the nursing notes.                              Differential diagnosis includes, but is not limited to, infection, metabolic derangement, head bleed, less likely intra-abdominal infection.  MDM: Overall well-appearing patient with no residual symptoms after transient episode of nausea, dizziness, headache.  Mild headache currently.  No focal infectious symptoms.  Work-up thus far has been unremarkable with normal troponin, EKG, basic labs and electrolytes, urinalysis.  I added on respiratory infectious panel, TSH, chest x-ray and will follow-up repeat troponin.  Since patient looks well,  if work-up on remarkable as above, will have her follow-up with her PMD for further testing and evaluation as needed.  Spoke to her and her grandson about return precautions if any new symptoms or worsening.  I doubt stroke at this time given compliance with anticoagulation unlikely ischemic stroke, and CT head negative for hemorrhagic stroke, also with transient symptoms and no neurological deficits at this time.  Patient's presentation is most consistent with acute presentation with potential threat to life or bodily function.       FINAL CLINICAL IMPRESSION(S) / ED DIAGNOSES   Final diagnoses:  Nonintractable headache, unspecified chronicity pattern, unspecified headache type  Nausea  Dizziness  Numbness and tingling     Rx / DC Orders   ED Discharge Orders     None        Note:  This document was prepared using Dragon voice recognition software and may include unintentional dictation errors.    Lucillie Garfinkel, MD 03/03/22 [phone]

## 2022-03-03 NOTE — ED Notes (Signed)
PT brought to ED rm 15 at this time, this RN now assuming care. MD at bedside.

## 2022-03-03 NOTE — ED Triage Notes (Signed)
Patient arrived by EMS from [facility]. C/o weakness and dizziness. Per EMS A&O x4. Patient reports she felt off today and wanted to be checked. Ambulatory per EMS  Hx a-fib

## 2022-03-03 NOTE — ED Triage Notes (Signed)
Patient c/o headache, weakness, and nausea started today. Also c/o numbness/tingling in bilateral legs and feet and numbness in right hand since stroke November 2022. Reports numbness and tingling has progressively gotten worse.

## 2022-03-03 NOTE — Discharge Instructions (Addendum)
Thank you for choosing [place] for your health care today!  Please see your primary doctor this week for a follow up appointment and to talk about your blood pressure which remains high despite your blood pressure medications.  If you do not have a primary doctor call the following clinics to establish care:  If you have insurance:  [facility] [phone] [place] [address]   [facility]  [phone] [address] [place] [address]   If you do not have insurance:  [facility]  [phone] [address] [place] [address]  Sometimes, in the early stages of certain disease courses it is difficult to detect in the emergency department evaluation -- so, it is important that you continue to monitor your symptoms and call your doctor right away or return to the emergency department if you develop any new or worsening symptoms.  It was my pleasure to care for you today.   Hoover Brunette Jacelyn Grip, MD

## 2022-03-04 ENCOUNTER — Ambulatory Visit (INDEPENDENT_AMBULATORY_CARE_PROVIDER_SITE_OTHER): Payer: PPO | Admitting: Physician Assistant

## 2022-03-04 VITALS — BP 127/72 | HR 75 | Ht 64.0 in | Wt 144.6 lb

## 2022-03-04 DIAGNOSIS — M67441 Ganglion, right hand: Secondary | ICD-10-CM | POA: Diagnosis not present

## 2022-03-04 DIAGNOSIS — R519 Headache, unspecified: Secondary | ICD-10-CM

## 2022-03-04 NOTE — Progress Notes (Unsigned)
     I,Sha'taria Tyson,acting as a Education administrator for [employer], PA-C.,have documented all relevant documentation on the behalf of [name], PA-C,as directed by  [name], PA-C while in the presence of [name], PA-C.   Established patient visit   Patient: Deanna Williams   DOB: 1928/07/07   88 y.o. Female  MRN: [id number] Visit Date: 03/04/2022  Today's healthcare provider: [name], PA-C   No chief complaint on file.  Subjective    HPI  Patient reports still having symptoms of headache, nausea, and syncope, legs feel swollen tight and tingling (right at first but now both). States she was seen at ER yesterday but still feeling the same.  Patient also reports lump on palm of right hand that has been there for a month and getting bigger in size. Associated with hardness, no pain or discharge  Cyst right palm near pink full movement of pinky Lightheaded nausea Headache achine L ttemple imporved w/ esleep  Liquids and tylenol  Medications: Outpatient Medications Prior to Visit  Medication Sig   amLODipine (NORVASC) 5 MG tablet Take 1 tablet (5 mg total) by mouth daily.   Cholecalciferol (VITAMIN D3) 25 MCG (1000 UT) CAPS Take 1,000 Units by mouth daily.   ELIQUIS 5 MG TABS tablet TAKE 1 TABLET BY MOUTH TWICE A DAY   gabapentin (NEURONTIN) 100 MG capsule Take 1 capsule (100 mg total) by mouth at bedtime. (Patient not taking: Reported on 12/29/2021)   No facility-administered medications prior to visit.    Review of Systems  {Labs  Heme  Chem  Endocrine  Serology  Results Review (optional):23779}   Objective    Blood pressure 127/72, pulse 75, height '5\' 4"'$  (1.626 m), weight 144 lb 9.6 oz (65.6 kg), SpO2 96 %.   Physical Exam Constitutional:      General: She is awake.     Appearance: She is well-developed.  HENT:     Head: Normocephalic.  Eyes:     Extraocular Movements: Extraocular movements intact.     Conjunctiva/sclera: Conjunctivae normal.      Pupils: Pupils are equal, round, and reactive to light.  Cardiovascular:     Rate and Rhythm: Normal rate and regular rhythm.     Heart sounds: Normal heart sounds.  Pulmonary:     Effort: Pulmonary effort is normal.     Breath sounds: Normal breath sounds.  Musculoskeletal:     Comments: Trace edema b/l ankles  Skin:    General: Skin is warm.  Neurological:     Mental Status: She is alert and oriented to person, place, and time.  Psychiatric:        Attention and Perception: Attention normal.        Mood and Affect: Mood normal.        Speech: Speech normal.        Behavior: Behavior is cooperative.     ***  No results found for any visits on 03/04/22.  Assessment & Plan     ***  No follow-ups on file.      {provider attestation***:1}   [name], PA-C  [facility] [phone] (phone) [phone] (fax)  [place]

## 2022-03-07 ENCOUNTER — Encounter: Payer: Self-pay | Admitting: Physician Assistant

## 2022-03-11 NOTE — Progress Notes (Unsigned)
I,Joseline E Rosas,acting as a scribe for [employer], MD.,have documented all relevant documentation on the behalf of Eulis Foster, MD,as directed by  Eulis Foster, MD while in the presence of Eulis Foster, MD.   Established patient visit   Patient: Deanna Williams   DOB: 05-23-1928   88 y.o. Female  MRN: [id number] Visit Date: 03/15/2022  Today's healthcare provider: Eulis Foster, MD   Chief Complaint  Patient presents with   Follow-Up Chronic Disease   Subjective    HPI  Hypertension, follow-up  BP Readings from Last 3 Encounters:  03/15/22 (!) 167/71  03/04/22 127/72  03/03/22 (!) 170/77   Wt Readings from Last 3 Encounters:  03/15/22 149 lb (67.6 kg)  03/04/22 144 lb 9.6 oz (65.6 kg)  03/03/22 140 lb (63.5 kg)     She was last seen for hypertension 2 months ago.  BP at that visit was 120/80. Management since that visit includes continue current medication amlodipine 5 mg daily.  She reports excellent compliance with treatment. She is not having side effects.  She states she has intermittent dizziness since having the stroke. She is adherent to low salt diet.   Outside blood pressures are not being checked. --------------------------------------------------------------------------------------------------- Lipid/Cholesterol, follow-up  Last Lipid Panel: Lab Results  Component Value Date   CHOL 219 (H) 07/06/2021   LDLCALC 123 (H) 07/06/2021   HDL 82 07/06/2021   TRIG 82 07/06/2021    She was last seen for this 2 months ago.  Management since that visit includes no changes, Patient not on cholesterol medication due to intolerance. Patient states she was taken off of medication that they think is due to interactions but are not sure  She denies myalgias    Symptoms: No appetite changes No foot ulcerations  No chest pain No chest pressure/discomfort  No dyspnea No orthopnea  No fatigue Yes  lower extremity edema  No palpitations No paroxysmal nocturnal dyspnea  No nausea Yes numbness or tingling of extremity  No polydipsia No polyuria  No speech difficulty No syncope   She is following a Regular diet. Current exercise: walking  Urinary symptoms  She reports new onset  burning . The current episode started yesterday and is  unchanged . Patient states symptoms are mild in intensity, occurring  daily . She  has not been recently treated for similar symptoms.    Associated symptoms: No abdominal pain No back pain  No chills No constipation  No cramping No diarrhea  No discharge No fever  No hematuria No nausea  No vomiting    ---------------------------------------------------------------------------------------   Atrial Fibrillation  Patient presents for follow up for Atrial fibrillation  She is adherent to her regimen of Eliquis 2.41m twice daily  She reports intermittent palpitations and chronic dizziness related to this  Dizzy feelings do not feel triggered, she reports that sometimes the dizziness acutely worse    Acute HA  Hx of stroke, left PCA s/p stenting  Patient reports chronic right sided weakness as residual of this stroke  She was last seen in the ED for worsening HA on 03/03/22 with normal head CT and labs at that time  She was evaluated in office for HA follow up on 10/20 with resolved symptoms  Today she reports her headache is swimmy. She denies that it occurs a few times during the week.   Osteoporosis  Patient reports she is adherent to Vitamin D 1000units daily  She reports getting calcium through her diet,  she is not taking currently taking supplementation  Calcium was WNL as of 03/03/22  She has had no recent fractures, hx of compression fractures noted after fall about 20 years ago while trying to attend to a child   Healthcare Maintenance  Recommended up to date Tetanus vaccine  Patient is UTD  with Covid vaccine and RSV Vaccine.  Last  metabolic panel Lab Results  Component Value Date   GLUCOSE 125 (H) 03/03/2022   NA 137 03/03/2022   K 4.2 03/03/2022   BUN 15 03/03/2022   CREATININE 0.84 03/03/2022   EGFR 53 (L) 10/20/2021   GFRNONAA >60 03/03/2022   CALCIUM 9.2 03/03/2022   AST 25 10/20/2021   ALT 22 10/20/2021   The ASCVD Risk score (Arnett DK, et al., 2019) failed to calculate for the following reasons:   The 2019 ASCVD risk score is only valid for ages 21 to 36   The patient has a prior MI or stroke diagnosis  ---------------------------------------------------------------------------------------------------   Medications: Outpatient Medications Prior to Visit  Medication Sig   Cholecalciferol (VITAMIN D3) 25 MCG (1000 UT) CAPS Take 1,000 Units by mouth daily.   gabapentin (NEURONTIN) 100 MG capsule Take 1 capsule (100 mg total) by mouth at bedtime.   [DISCONTINUED] amLODipine (NORVASC) 5 MG tablet Take 1 tablet (5 mg total) by mouth daily.   [DISCONTINUED] ELIQUIS 5 MG TABS tablet TAKE 1 TABLET BY MOUTH TWICE A DAY   No facility-administered medications prior to visit.    Review of Systems     Objective    BP (!) 167/71 (BP Location: Right Arm, Patient Position: Sitting, Cuff Size: Normal)   Pulse 64   Temp 98 F (36.7 C) (Oral)   Resp 16   Ht _0  (1.6 m)   Wt 149 lb (67.6 kg)   SpO2 96%   BMI 26.39 kg/m    Physical Exam Vitals reviewed.  Constitutional:      General: She is not in acute distress.    Appearance: Normal appearance. She is not ill-appearing, toxic-appearing or diaphoretic.  Eyes:     Conjunctiva/sclera: Conjunctivae normal.  Cardiovascular:     Rate and Rhythm: Normal rate. Rhythm irregular.     Pulses: Normal pulses.     Heart sounds: Normal heart sounds. No murmur heard.    No friction rub. No gallop.  Pulmonary:     Effort: Pulmonary effort is normal. No respiratory distress.     Breath sounds: Normal breath sounds. No stridor. No wheezing, rhonchi or rales.   Abdominal:     General: Bowel sounds are normal. There is no distension.     Palpations: Abdomen is soft.     Tenderness: There is no abdominal tenderness.  Musculoskeletal:     Right lower leg: No edema.     Left lower leg: No edema.  Skin:    Findings: No erythema or rash.  Neurological:     Mental Status: She is alert and oriented to person, place, and time.       Results for orders placed or performed in visit on 03/15/22  POCT urinalysis dipstick  Result Value Ref Range   Color, UA light yellow    Clarity, UA clear    Glucose, UA Negative Negative   Bilirubin, UA Negative    Ketones, UA Negative    Spec Grav, UA 1.010 1.010 - 1.025   Blood, UA Negative    pH, UA 6.5 5.0 - 8.0   Protein, UA Negative  Negative   Urobilinogen, UA 0.2 0.2 or 1.0 E.U./dL   Nitrite, UA Negative    Leukocytes, UA Negative Negative   Appearance     Odor       Assessment & Plan     Problem List Items Addressed This Visit       Cardiovascular and Mediastinum   BP (high blood pressure) - Primary    Chronic, controlled Continue current medications at current doses Follow-up 3 months       Relevant Medications   apixaban (ELIQUIS) 5 MG TABS tablet   amLODipine (NORVASC) 5 MG tablet   Other Relevant Orders   Ambulatory referral to Cardiology   Acute ischemic left PCA stroke ([place])    Stable She follows with [facility], she is requesting referral to transfer care to [facility] area Referral submitted for neurology Patient is on Eliquis 5 mg twice daily, she will continue this medicine She is on amlodipine 5 mg daily for blood pressure management Refill submitted for Eliquis and amlodipine She will follow-up in 3 months      Relevant Medications   apixaban (ELIQUIS) 5 MG TABS tablet   amLODipine (NORVASC) 5 MG tablet   Other Relevant Orders   Ambulatory referral to Neurology   Atrial fibrillation ([place])    Chronic, patient adherent to 5 mg Eliquis twice  daily She is requesting transfer of care to [facility] area from [facility] Referral submitted Rate controlled Asymptomatic today      Relevant Medications   apixaban (ELIQUIS) 5 MG TABS tablet   amLODipine (NORVASC) 5 MG tablet   Other Relevant Orders   Ambulatory referral to Cardiology     Musculoskeletal and Integument   Osteoporosis    Patient is no longer on Fosamax She is on vitamin D 1000 units daily We discussed the importance of 1200 mg of calcium daily Last calcium levels within normal range No new fractures She will follow-up in 3 months        Other   S/P angioplasty with PCA stent    She will continue Eliquis 5 mg daily Chronic and stable Patient has chronic right-sided residual weakness/paresthesias She follows with neurology She will transfer care to [facility] area for neurology      Healthcare maintenance    Discussed recommendations for COVID, RSV and tetanus vaccination Patient is up-to-date on COVID and RSV vaccines       Burning with urination    Urine analysis, dipstick negative today Given patient's history of complication associated with UTI, we will send prescription for Keflex 500 mg twice daily for 7-day course with printed prescription so the patient can use this if symptoms persist or worsen We will send for urine culture Patient to follow-up if symptoms persist or worsen       Relevant Orders   POCT urinalysis dipstick (Completed)   Urine Culture   Other Visit Diagnoses     Dizziness       Relevant Medications   amLODipine (NORVASC) 5 MG tablet        Return in about 3 months (around 06/15/2022).      I, Eulis Foster, MD, have reviewed all documentation for this visit. The documentation on 03/15/22 for the exam, diagnosis, procedures, and orders are all accurate and complete.  Portions of this information were initially documented by the CMA and reviewed by me for thoroughness and accuracy.      Eulis Foster, MD  [facility] [phone] (phone) [phone] (fax)  [facility]  

## 2022-03-15 ENCOUNTER — Ambulatory Visit (INDEPENDENT_AMBULATORY_CARE_PROVIDER_SITE_OTHER): Payer: PPO | Admitting: Family Medicine

## 2022-03-15 ENCOUNTER — Telehealth: Payer: Self-pay | Admitting: Cardiology

## 2022-03-15 ENCOUNTER — Ambulatory Visit: Payer: PPO | Admitting: Family Medicine

## 2022-03-15 ENCOUNTER — Encounter: Payer: Self-pay | Admitting: Family Medicine

## 2022-03-15 VITALS — BP 167/71 | HR 64 | Temp 98.0°F | Resp 16 | Ht 63.0 in | Wt 149.0 lb

## 2022-03-15 DIAGNOSIS — M810 Age-related osteoporosis without current pathological fracture: Secondary | ICD-10-CM

## 2022-03-15 DIAGNOSIS — I4891 Unspecified atrial fibrillation: Secondary | ICD-10-CM

## 2022-03-15 DIAGNOSIS — R42 Dizziness and giddiness: Secondary | ICD-10-CM | POA: Diagnosis not present

## 2022-03-15 DIAGNOSIS — Z95820 Peripheral vascular angioplasty status with implants and grafts: Secondary | ICD-10-CM | POA: Diagnosis not present

## 2022-03-15 DIAGNOSIS — I10 Essential (primary) hypertension: Secondary | ICD-10-CM | POA: Diagnosis not present

## 2022-03-15 DIAGNOSIS — Z0000 Encounter for general adult medical examination without abnormal findings: Secondary | ICD-10-CM | POA: Diagnosis not present

## 2022-03-15 DIAGNOSIS — R300 Dysuria: Secondary | ICD-10-CM | POA: Diagnosis not present

## 2022-03-15 DIAGNOSIS — I63532 Cerebral infarction due to unspecified occlusion or stenosis of left posterior cerebral artery: Secondary | ICD-10-CM | POA: Diagnosis not present

## 2022-03-15 LAB — POCT URINALYSIS DIPSTICK
Bilirubin, UA: NEGATIVE
Blood, UA: NEGATIVE
Glucose, UA: NEGATIVE
Ketones, UA: NEGATIVE
Leukocytes, UA: NEGATIVE
Nitrite, UA: NEGATIVE
Protein, UA: NEGATIVE
Spec Grav, UA: 1.01 (ref 1.010–1.025)
Urobilinogen, UA: 0.2 E.U./dL
pH, UA: 6.5 (ref 5.0–8.0)

## 2022-03-15 MED ORDER — CEPHALEXIN 500 MG PO CAPS: 500.0000 mg | ORAL_CAPSULE | Freq: Two times a day (BID) | ORAL | 0 refills | Status: AC

## 2022-03-15 MED ORDER — APIXABAN 5 MG PO TABS: 5.0000 mg | ORAL_TABLET | Freq: Two times a day (BID) | ORAL | 1 refills | Status: DC

## 2022-03-15 MED ORDER — AMLODIPINE BESYLATE 5 MG PO TABS: 5.0000 mg | ORAL_TABLET | Freq: Every day | ORAL | 1 refills | Status: DC

## 2022-03-15 NOTE — Assessment & Plan Note (Signed)
Urine analysis, dipstick negative today Given patient's history of complication associated with UTI, we will send prescription for Keflex 500 mg twice daily for 7-day course with printed prescription so the patient can use this if symptoms persist or worsen We will send for urine culture Patient to follow-up if symptoms persist or worsen

## 2022-03-15 NOTE — Patient Instructions (Addendum)
The last neurology physician listed is Alric Ran, MD  Today we will submit referrals for cardiology and neurology in [place].  Please be on the look out for a call to schedule these appointments.  Please continue your amlodipine 5 mg daily for blood pressure.  Please continue to use your cane and walker to assist with safely walking around your home.  I have printed a prescription for an antibiotic in the event that your UTI symptoms persist/worsen.  We will follow-up with results of urine culture once available.  As we discussed, please continue to take your vitamin D and aim for a goal of 1200 mg of calcium daily in your diet.  I look forward to seeing you in 3 months to follow-up on blood pressure.

## 2022-03-15 NOTE — Telephone Encounter (Signed)
Pt requesting to do a provider switch due to pt not being able to drive to [place] any longer. [facility] office is closer for her. Please advise

## 2022-03-15 NOTE — Assessment & Plan Note (Signed)
Stable She follows with [facility], she is requesting referral to transfer care to [facility] area Referral submitted for neurology Patient is on Eliquis 5 mg twice daily, she will continue this medicine She is on amlodipine 5 mg daily for blood pressure management Refill submitted for Eliquis and amlodipine She will follow-up in 3 months

## 2022-03-15 NOTE — Assessment & Plan Note (Signed)
Discussed recommendations for COVID, RSV and tetanus vaccination Patient is up-to-date on COVID and RSV vaccines

## 2022-03-15 NOTE — Assessment & Plan Note (Signed)
Chronic, controlled Continue current medications at current doses Follow-up 3 months

## 2022-03-15 NOTE — Assessment & Plan Note (Signed)
She will continue Eliquis 5 mg daily Chronic and stable Patient has chronic right-sided residual weakness/paresthesias She follows with neurology She will transfer care to [facility] area for neurology

## 2022-03-15 NOTE — Assessment & Plan Note (Signed)
Chronic, patient adherent to 5 mg Eliquis twice daily She is requesting transfer of care to [facility] area from [facility] Referral submitted Rate controlled Asymptomatic today

## 2022-03-15 NOTE — Assessment & Plan Note (Signed)
Patient is no longer on Fosamax She is on vitamin D 1000 units daily We discussed the importance of 1200 mg of calcium daily Last calcium levels within normal range No new fractures She will follow-up in 3 months

## 2022-03-18 LAB — SPECIMEN STATUS REPORT

## 2022-03-18 LAB — URINE CULTURE

## 2022-03-24 DIAGNOSIS — R202 Paresthesia of skin: Secondary | ICD-10-CM | POA: Diagnosis not present

## 2022-03-24 DIAGNOSIS — R7309 Other abnormal glucose: Secondary | ICD-10-CM | POA: Diagnosis not present

## 2022-03-24 DIAGNOSIS — R200 Anesthesia of skin: Secondary | ICD-10-CM | POA: Diagnosis not present

## 2022-03-24 DIAGNOSIS — M7130 Other bursal cyst, unspecified site: Secondary | ICD-10-CM | POA: Diagnosis not present

## 2022-03-24 DIAGNOSIS — Z9889 Other specified postprocedural states: Secondary | ICD-10-CM | POA: Diagnosis not present

## 2022-03-24 DIAGNOSIS — Z8673 Personal history of transient ischemic attack (TIA), and cerebral infarction without residual deficits: Secondary | ICD-10-CM | POA: Diagnosis not present

## 2022-03-31 ENCOUNTER — Ambulatory Visit: Payer: Self-pay

## 2022-03-31 NOTE — Telephone Encounter (Signed)
2nd attempt, Patient called, left VM to return the call to the office to discuss symptoms with a nurse.  Summary: UTI and antibiotic   Pt was treated recently for a UTI / pt asked if she can get another antibiotic / she stated she has a burning sensation and frequency / please advise

## 2022-03-31 NOTE — Telephone Encounter (Signed)
3rd attempt, pt called, LVMTCB to discuss sx and schedule appt. Will route to office.

## 2022-03-31 NOTE — Telephone Encounter (Signed)
Pt already has NT encounter open for same symptoms. Please review.    This encounter was created in error - please disregard.

## 2022-03-31 NOTE — Telephone Encounter (Signed)
On 03/18/2022 patient was advised Urine culture only showing normal urogenital flora and not an organism associated with urinary tract infection.   Would recommend re-collection of urine culture if symptoms not improved after treatment.  If patient calls back OK for [facility] Nurse to schedule an appointment. Try calling patient NA.

## 2022-03-31 NOTE — Telephone Encounter (Signed)
Message from Sharene Skeans sent at 03/31/2022  8:21 AM EST  Summary: UTI and antibiotic   Pt was treated recently for a UTI / pt asked if she can get another antibiotic / she stated she has a burning sensation and frequency / please advise         Called pt and LM on VM to call back to discuss sx.

## 2022-04-01 NOTE — Telephone Encounter (Signed)
Patient called back and reports that she will call to schedule appointment if symptoms worsen. Agreed with patient and advised that she can go to UC this weekend if need to.

## 2022-04-01 NOTE — Telephone Encounter (Signed)
LMTCB-If patient having symptoms, need appointment.

## 2022-04-02 DIAGNOSIS — N390 Urinary tract infection, site not specified: Secondary | ICD-10-CM | POA: Diagnosis not present

## 2022-04-02 DIAGNOSIS — R399 Unspecified symptoms and signs involving the genitourinary system: Secondary | ICD-10-CM | POA: Diagnosis not present

## 2022-04-11 DIAGNOSIS — E538 Deficiency of other specified B group vitamins: Secondary | ICD-10-CM | POA: Diagnosis not present

## 2022-04-18 ENCOUNTER — Ambulatory Visit: Payer: Self-pay | Admitting: *Deleted

## 2022-04-18 NOTE — Telephone Encounter (Signed)
I would recommend at minimum a virtual visit but really would prefer to listen to patient in person and determine next steps for treatment in place of prescribing medication without much clinical data.    Strong recommendation for in office visit prior to prescription.   Eulis Foster, MD  [facility]

## 2022-04-18 NOTE — Telephone Encounter (Signed)
    Chief Complaint: Cough 3 days Symptoms: Cough sometimes productive Frequency: 3 days Pertinent Negatives: Patient denies Fever Disposition: '[]'$ ED /'[]'$ Urgent Care (no appt availability in office) / '[]'$ Appointment(In office/virtual)/ '[x]'$  [facility]/ '[]'$ Home Care/ '[]'$ Refused Recommended Disposition /'[]'$ [facility]/ '[]'$  Follow-up with PCP Additional Notes: Pt  does not want to take otc medications. She takes several other medications and would like to speak with a provider.  Pt has a stroke and afib hx.   Called [facility] help desk to help with appt.   Summary: cough    Patient experiencing cough and has no energy, patient declined in office or virtual appointment because she does not feel well and her age.  Requesting a Rx.     Reason for Disposition  Cough  Answer Assessment - Initial Assessment Questions 1. ONSET: "When did the cough begin?"      2 days  2. SEVERITY: "How bad is the cough today?"      No very bad 3. SPUTUM: "Describe the color of your sputum" (none, dry cough; clear, white, yellow, green)     White 4. HEMOPTYSIS: "Are you coughing up any blood?" If so ask: "How much?" (flecks, streaks, tablespoons, etc.)     no 5. DIFFICULTY BREATHING: "Are you having difficulty breathing?" If Yes, ask: "How bad is it?" (e.g., mild, moderate, severe)    - MILD: No SOB at rest, mild SOB with walking, speaks normally in sentences, can lie down, no retractions, pulse < 100.    - MODERATE: SOB at rest, SOB with minimal exertion and prefers to sit, cannot lie down flat, speaks in phrases, mild retractions, audible wheezing, pulse 100-120.    - SEVERE: Very SOB at rest, speaks in single words, struggling to breathe, sitting hunched forward, retractions, pulse > 120      A little bit from stroke 6. FEVER: "Do you have a fever?" If Yes, ask: "What is your temperature, how was it measured, and when did it start?"     no 7. CARDIAC HISTORY: "Do you have any history of heart  disease?" (e.g., heart attack, congestive heart failure)      Afib 8. LUNG HISTORY: "Do you have any history of lung disease?"  (e.g., pulmonary embolus, asthma, emphysema)     NO 9. PE RISK FACTORS: "Do you have a history of blood clots?" (or: recent major surgery, recent prolonged travel, bedridden)      10. OTHER SYMPTOMS: "Do you have any other symptoms?" (e.g., runny nose, wheezing, chest pain)       no 11. PREGNANCY: "Is there any chance you are pregnant?" "When was your last menstrual period?"        12. TRAVEL: "Have you traveled out of the country in the last month?" (e.g., travel history, exposures)  Protocols used: Cough - Acute Non-Productive-A-AH

## 2022-04-18 NOTE — Telephone Encounter (Signed)
Patient called, left VM to return the call to the office to discuss symptoms with a nurse.   Summary: cough   Patient experiencing cough and has no energy, patient declined in office or virtual appointment because she does not feel well and her age.  Requesting a Rx.

## 2022-04-18 NOTE — Telephone Encounter (Signed)
Please Review and advise 

## 2022-04-18 NOTE — Telephone Encounter (Signed)
Third attempt to reach patient- went straight to voice mail. Left message to call office.

## 2022-04-18 NOTE — Telephone Encounter (Signed)
Summary: cough   Patient experiencing cough and has no energy, patient declined in office or virtual appointment because she does not feel well and her age.  Requesting a Rx.        Called patient  [phone] to review sx of cough and "no energy". No answer, LVMTCB [phone].

## 2022-04-22 NOTE — Telephone Encounter (Signed)
Spoke with patient, scheduled appt with Daneil Dan for 12/14.

## 2022-04-28 ENCOUNTER — Ambulatory Visit (INDEPENDENT_AMBULATORY_CARE_PROVIDER_SITE_OTHER): Payer: PPO | Admitting: Family Medicine

## 2022-04-28 ENCOUNTER — Encounter: Payer: Self-pay | Admitting: Family Medicine

## 2022-04-28 VITALS — BP 139/76 | HR 65 | Resp 16 | Ht 63.0 in | Wt 146.0 lb

## 2022-04-28 DIAGNOSIS — M792 Neuralgia and neuritis, unspecified: Secondary | ICD-10-CM | POA: Diagnosis not present

## 2022-04-28 MED ORDER — AMITRIPTYLINE HCL 25 MG PO TABS: 25.0000 mg | ORAL_TABLET | Freq: Every day | ORAL | 1 refills | Status: DC

## 2022-04-28 NOTE — Progress Notes (Signed)
Established patient visit   Patient: Deanna Williams   DOB: 02/15/1929   88 y.o. Female  MRN: [id number] Visit Date: 04/28/2022  Today's healthcare provider: Gwyneth Sprout, FNP  Introduced to nurse practitioner role and practice setting.  All questions answered.  Discussed provider/patient relationship and expectations.  I,Tiffany J Bragg,acting as a scribe for Gwyneth Sprout, FNP.,have documented all relevant documentation on the behalf of Gwyneth Sprout, FNP,as directed by  Gwyneth Sprout, FNP while in the presence of Gwyneth Sprout, FNP.   Chief Complaint  Patient presents with   Numbness    Patient complains of continued extremity numbness and fatigue. States her neurologist started her on B12 shots, she's had one a week for 3 weeks but does not feel better.    Subjective    HPI HPI     Numbness    Additional comments: Patient complains of continued extremity numbness and fatigue. States her neurologist started her on B12 shots, she's had one a week for 3 weeks but does not feel better.       Last edited by Smitty Knudsen, CMA on 04/28/2022  2:25 PM.       Medications: Outpatient Medications Prior to Visit  Medication Sig   amLODipine (NORVASC) 5 MG tablet Take 1 tablet (5 mg total) by mouth daily.   apixaban (ELIQUIS) 5 MG TABS tablet Take 1 tablet (5 mg total) by mouth 2 (two) times daily.   Cholecalciferol (VITAMIN D3) 25 MCG (1000 UT) CAPS Take 1,000 Units by mouth daily.   cyanocobalamin (VITAMIN B12) 1000 MCG/ML injection Inject into the muscle.   gabapentin (NEURONTIN) 100 MG capsule Take 1 capsule (100 mg total) by mouth at bedtime.   No facility-administered medications prior to visit.    Review of Systems    Objective    BP 139/76 (BP Location: Right Arm, Patient Position: Sitting, Cuff Size: Normal)   Pulse 65   Resp 16   Ht '5\' 3"'$  (1.6 m)   Wt 146 lb (66.2 kg)   SpO2 99%   BMI 25.86 kg/m   Physical Exam Vitals and nursing note reviewed.   Constitutional:      General: She is not in acute distress.    Appearance: Normal appearance. She is overweight. She is not ill-appearing, toxic-appearing or diaphoretic.  HENT:     Head: Normocephalic and atraumatic.  Cardiovascular:     Rate and Rhythm: Normal rate and regular rhythm.     Pulses: Normal pulses.     Heart sounds: Normal heart sounds. No murmur heard.    No friction rub. No gallop.  Pulmonary:     Effort: Pulmonary effort is normal. No respiratory distress.     Breath sounds: Normal breath sounds. No stridor. No wheezing, rhonchi or rales.  Chest:     Chest wall: No tenderness.  Musculoskeletal:        General: No swelling, tenderness, deformity or signs of injury. Normal range of motion.     Right lower leg: No edema.     Left lower leg: No edema.     Comments: 4/5 strength noted on RUE and RLE  Skin:    General: Skin is warm and dry.     Capillary Refill: Capillary refill takes less than 2 seconds.     Coloration: Skin is not jaundiced or pale.     Findings: No bruising, erythema, lesion or rash.  Neurological:     General:  No focal deficit present.     Mental Status: She is alert and oriented to person, place, and time. Mental status is at baseline.     Cranial Nerves: No cranial nerve deficit.     Sensory: No sensory deficit.     Motor: No weakness.     Coordination: Coordination normal.  Psychiatric:        Mood and Affect: Mood normal.        Behavior: Behavior normal.        Thought Content: Thought content normal.        Judgment: Judgment normal.     No results found for any visits on 04/28/22.  Assessment & Plan     Problem List Items Addressed This Visit       Other   Neuropathic pain - Primary    Chronic since CVA Has not noted improvement since start of B12 injection and oral supplements Trial of medication today to assist 1 month f/u with PCP      Relevant Medications   amitriptyline (ELAVIL) 25 MG tablet   Return in about 4  weeks (around 05/26/2022).     Vonna Kotyk, FNP, have reviewed all documentation for this visit. The documentation on 04/28/22 for the exam, diagnosis, procedures, and orders are all accurate and complete.  Gwyneth Sprout, [place] [phone] (phone) [phone] (fax)  [address]

## 2022-04-28 NOTE — Assessment & Plan Note (Signed)
Chronic since CVA Has not noted improvement since start of B12 injection and oral supplements Trial of medication today to assist 1 month f/u with PCP

## 2022-05-19 NOTE — Progress Notes (Signed)
New Outpatient Visit Date: 05/20/2022  Referring Provider: Eulis Foster, [address] [place],  [place] [address]  Chief Complaint: Establish care for atrial fibrillation and hypertension  HPI:  Deanna Williams is a 89 y.o. female who is being seen today for the evaluation of atrial fibrillation and hypertension at the request of Dr. Alba Cory. She has a history of cryptogenic stroke with subsequent diagnosis of paroxysmal atrial fibrillation, hypertension, and hyperlipidemia.  She was previously evaluated in [place] by Dr. Curt Bears but prefers to be followed in [place].  Today, Deanna Williams reports that she has been feeling fairly well.  Her main complaint is of numbness and tingling in the right leg as well as sensation of swelling and tightness in both legs (right greater than left) though she does not actually seek swelling.  She has chronic weakness in the right leg following her stroke.  She is scheduled for further neurologic evaluation in the near future.  She has sporadic palpitations, usually about once a month.  Episodes last about 5 minutes and are without associated symptoms.  However, she also reports sporadic lightheadedness though she has not passed out or fallen.  She feels like her chronic exertional dyspnea has gotten a little bit worse over the last few months.  She denies chest pain.  --------------------------------------------------------------------------------------------------  Cardiovascular History & Procedures: Cardiovascular Problems: Stroke Paroxysmal atrial fibrillation  Risk Factors: Hypertension, hyperlipidemia, age greater than 75  Cath/PCI: None  CV Surgery: None  EP Procedures and Devices: None  Non-Invasive Evaluation(s): Carotid Doppler (01/04/2022): TTE (09/01/2020): Normal LV size and wall thickness.  LVEF 60-65% with grade 1 diastolic dysfunction.  No regional wall motion abnormalities.  Normal RV size  and function.  Mild pulmonary hypertension.  Normal biatrial size.  Mild tricuspid regurgitation.  Normal CVP.  Recent CV Pertinent Labs: Lab Results  Component Value Date   CHOL 219 (H) 07/06/2021   HDL 82 07/06/2021   LDLCALC 123 (H) 07/06/2021   TRIG 82 07/06/2021   CHOLHDL 2.7 07/06/2021   CHOLHDL 2.5 09/02/2020   INR 1.1 09/08/2021   K 4.2 03/03/2022   MG 2.1 09/04/2020   BUN 15 03/03/2022   BUN 18 10/20/2021   CREATININE 0.84 03/03/2022   CREATININE 0.82 03/07/2017    --------------------------------------------------------------------------------------------------  Past Medical History:  Diagnosis Date   Atrial fibrillation (HCC)    Gastroesophageal reflux disease    Hyperlipidemia    Hypertension    Lichen sclerosus    Lichen sclerosus et atrophicus    Osteopenia    Palpitations    Stroke [facility])     Past Surgical History:  Procedure Laterality Date   CARPAL TUNNEL RELEASE     FOOT SURGERY     HYSTEROSCOPY WITH D & C N/A 11/02/2015   Procedure: DILATATION AND CURETTAGE /HYSTEROSCOPY;  Surgeon: Brayton Mars, MD;  Location: [facility] ORS;  Service: Gynecology;  Laterality: N/A;   IR CT HEAD LTD  09/01/2020   IR INTRA CRAN STENT  09/01/2020   IR PERCUTANEOUS ART THROMBECTOMY/INFUSION INTRACRANIAL INC DIAG ANGIO  09/01/2020   IR RADIOLOGIST EVAL & MGMT  10/14/2020   RADIOLOGY WITH ANESTHESIA N/A 09/01/2020   Procedure: IR WITH ANESTHESIA;  Surgeon: Radiologist, Medication, MD;  Location: [place];  Service: Radiology;  Laterality: N/A;    Current Meds  Medication Sig   amitriptyline (ELAVIL) 25 MG tablet Take 1 tablet (25 mg total) by mouth at bedtime.   amLODipine (NORVASC) 5 MG tablet Take 1 tablet (5  mg total) by mouth daily.   apixaban (ELIQUIS) 5 MG TABS tablet Take 1 tablet (5 mg total) by mouth 2 (two) times daily.   Cholecalciferol (VITAMIN D3) 25 MCG (1000 UT) CAPS Take 1,000 Units by mouth daily.   cyanocobalamin (VITAMIN B12) 1000 MCG/ML injection Inject  1,000 mcg into the muscle every 30 (thirty) days.   cyanocobalamin 1000 MCG tablet Take 1,000 mcg by mouth daily.   gabapentin (NEURONTIN) 100 MG capsule Take 1 capsule (100 mg total) by mouth at bedtime.    Allergies: Patient has no known allergies.  Social History   Tobacco Use   Smoking status: Never   Smokeless tobacco: Never  Vaping Use   Vaping Use: Never used  Substance Use Topics   Alcohol use: Not Currently    Comment: 1 drink less than once a week.   Drug use: No    Family History  Problem Relation Age of Onset   Cancer Mother    Leukemia Mother    Cancer Father        colon   Hypertension Father    Arthritis Sister    Breast cancer Neg Hx     Review of Systems: A 12-system review of systems was performed and was negative except as noted in the HPI.  --------------------------------------------------------------------------------------------------  Physical Exam: BP (!) 160/90 (BP Location: Left Arm)   Pulse 71   Ht '5\' 3"'$  (1.6 m)   Wt 146 lb (66.2 kg)   SpO2 97%   BMI 25.86 kg/m   General: NAD.  Accompanied by her granddaughter. HEENT: No conjunctival pallor or scleral icterus. Neck: Supple without lymphadenopathy, thyromegaly, JVD, or HJR. No carotid bruit. Lungs: Normal work of breathing. Clear to auscultation bilaterally without wheezes or crackles. Heart: Regular rate and rhythm without murmurs, rubs, or gallops. Non-displaced PMI. Abd: Bowel sounds present. Soft, NT/ND without hepatosplenomegaly Ext: No lower extremity edema. Radial, PT, and DP pulses are 2+ bilaterally Skin: Warm and dry without rash. Neuro: Normal fine touch sensation in upper and lower extremities.  4/5 right hip flexion strength.  Otherwise, 5/5 strength in upper and lower extremities. Psych: Normal mood and affect.  EKG: Normal sinus rhythm with poor R wave progression and inferior infarct.  Compared with prior tracing from 03/03/2022, inferior Q waves are now present.  R  wave progression is also a little bit worse.  Lab Results  Component Value Date   WBC 5.1 03/03/2022   HGB 14.2 03/03/2022   HCT 43.7 03/03/2022   MCV 94.2 03/03/2022   PLT 221 03/03/2022    Lab Results  Component Value Date   NA 137 03/03/2022   K 4.2 03/03/2022   CL 109 03/03/2022   CO2 23 03/03/2022   BUN 15 03/03/2022   CREATININE 0.84 03/03/2022   GLUCOSE 125 (H) 03/03/2022   ALT 22 10/20/2021    Lab Results  Component Value Date   CHOL 219 (H) 07/06/2021   HDL 82 07/06/2021   LDLCALC 123 (H) 07/06/2021   TRIG 82 07/06/2021   CHOLHDL 2.7 07/06/2021     --------------------------------------------------------------------------------------------------  ASSESSMENT AND PLAN: Paroxysmal atrial fibrillation, palpitations, and dizziness: Deanna Williams reports sporadic palpitations, usually happening about once a month.  I wonder if she has having more frequent paroxysms of atrial fibrillation leading to her intermittent lightheadedness.  We have agreed to obtain a 14-day event monitor for further evaluation.  Continue indefinite anticoagulation with apixaban 5 mg twice daily based on age, weight, and creatinine.  Dyspnea  on exertion: This has gotten a little worse over the last few months.  Deanna Williams appears euvolemic on exam.  Echocardiogram in 2022 showed normal LVEF with grade 1 diastolic dysfunction and mild pulmonary hypertension.  We will begin with an echocardiogram for further evaluation.  Given new inferior Q waves on EKG and more pronounced poor R wave progression, we may need to consider noninvasive ischemia evaluation to exclude ischemic substrate.  We have agreed to increase amlodipine to 7.5 mg daily for improved blood pressure control as well as some antianginal effects.  Hypertension: Blood pressure suboptimally controlled today.  Increase amlodipine to 7.5 mg daily.  Hyperlipidemia: LDL 123 on last check in February.  She has been intolerant of statins in  the past.  We will readdress rechallenging her with a statin at follow-up.  Follow-up: Return to clinic in 6 weeks.  Nelva Bush, MD 05/20/2022 10:23 AM

## 2022-05-20 ENCOUNTER — Other Ambulatory Visit: Payer: Self-pay | Admitting: Family Medicine

## 2022-05-20 ENCOUNTER — Ambulatory Visit: Payer: PPO | Attending: Internal Medicine | Admitting: Internal Medicine

## 2022-05-20 ENCOUNTER — Encounter: Payer: Self-pay | Admitting: Internal Medicine

## 2022-05-20 ENCOUNTER — Ambulatory Visit (INDEPENDENT_AMBULATORY_CARE_PROVIDER_SITE_OTHER): Payer: PPO

## 2022-05-20 VITALS — BP 160/90 | HR 71 | Ht 63.0 in | Wt 146.0 lb

## 2022-05-20 DIAGNOSIS — R0609 Other forms of dyspnea: Secondary | ICD-10-CM | POA: Diagnosis not present

## 2022-05-20 DIAGNOSIS — I480 Paroxysmal atrial fibrillation: Secondary | ICD-10-CM

## 2022-05-20 DIAGNOSIS — M792 Neuralgia and neuritis, unspecified: Secondary | ICD-10-CM

## 2022-05-20 DIAGNOSIS — I10 Essential (primary) hypertension: Secondary | ICD-10-CM | POA: Diagnosis not present

## 2022-05-20 DIAGNOSIS — E785 Hyperlipidemia, unspecified: Secondary | ICD-10-CM

## 2022-05-20 DIAGNOSIS — R002 Palpitations: Secondary | ICD-10-CM

## 2022-05-20 DIAGNOSIS — R42 Dizziness and giddiness: Secondary | ICD-10-CM

## 2022-05-20 MED ORDER — AMLODIPINE BESYLATE 5 MG PO TABS: 7.5000 mg | ORAL_TABLET | Freq: Every day | ORAL | 3 refills | Status: DC

## 2022-05-20 NOTE — Patient Instructions (Signed)
Medication Instructions:  Your physician recommends the following medication changes.  INCREASE: Amlodipine to 7.5 mg (1 1/2 tablet) by mouth daily   *If you need a refill on your cardiac medications before your next appointment, please call your pharmacy*   Lab Work: None ordered today   Testing/Procedures: Your physician has requested that you have an echocardiogram. Echocardiography is a painless test that uses sound waves to create images of your heart. It provides your doctor with information about the size and shape of your heart and how well your heart's chambers and valves are working.   You may receive an ultrasound enhancing agent through an IV if needed to better visualize your heart during the echo. This procedure takes approximately one hour.  There are no restrictions for this procedure.  This will take place at [facility] [id number], [place]   Your physician has recommended that you wear a 14 day Zio monitor.   This monitor is a medical device that records the heart's electrical activity. Doctors most often use these monitors to diagnose arrhythmias. Arrhythmias are problems with the speed or rhythm of the heartbeat. The monitor is a small device applied to your chest. You can wear one while you do your normal daily activities. While wearing this monitor if you have any symptoms to push the button and record what you felt. Once you have worn this monitor for the period of time provider prescribed (Usually 14 days), you will return the monitor device in the postage paid box. Once it is returned they will download the data collected and provide [place] with a report which the provider will then review and we will call you with those results. Important tips:  Avoid showering during the first 24 hours of wearing the monitor. Avoid excessive sweating to help maximize wear time. Do not submerge the device, no hot tubs, and no swimming pools. Keep  any lotions or oils away from the patch. After 24 hours you may shower with the patch on. Take brief showers with your back facing the shower head.  Do not remove patch once it has been placed because that will interrupt data and decrease adhesive wear time. Push the button when you have any symptoms and write down what you were feeling. Once you have completed wearing your monitor, remove and place into box which has postage paid and place in your outgoing mailbox.  If for some reason you have misplaced your box then call our office and we can provide another box and/or mail it off for you.    Follow-Up: At [facility], you and your health needs are our priority.  As part of our continuing mission to provide you with exceptional heart care, we have created designated Provider Care Teams.  These Care Teams include your primary Cardiologist (physician) and Advanced Practice Providers (APPs -  Physician Assistants and Nurse Practitioners) who all work together to provide you with the care you need, when you need it.  We recommend signing up for the patient portal called "MyChart".  Sign up information is provided on this After Visit Summary.  MyChart is used to connect with patients for Virtual Visits (Telemedicine).  Patients are able to view lab/test results, encounter notes, upcoming appointments, etc.  Non-urgent messages can be sent to your provider as well.   To learn more about what you can do with MyChart, go to [email or web address].    Your next appointment:   6 week(s)  The format for your next appointment:   In Person  Provider:   You may see Nelva Bush, MD or one of the following Advanced Practice Providers on your designated Care Team:   Murray Hodgkins, NP Christell Faith, PA-C Cadence Kathlen Mody, PA-C Gerrie Nordmann, NP

## 2022-05-20 NOTE — Telephone Encounter (Signed)
Unable to refill per protocol, Rx request is too soon. Last refill 04/28/22 for 30 and 1 refill.  Requested Prescriptions  Pending Prescriptions Disp Refills   amitriptyline (ELAVIL) 25 MG tablet [Pharmacy Med Name: AMITRIPTYLINE HCL 25 MG TAB] 90 tablet 1    Sig: TAKE 1 TABLET BY MOUTH EVERYDAY AT BEDTIME     Psychiatry:  Antidepressants - Heterocyclics (TCAs) Passed - 05/20/2022  8:32 AM      Passed - Valid encounter within last 6 months    Recent Outpatient Visits           3 weeks ago Neuropathic pain   [facility] Gwyneth Sprout, FNP   2 months ago Primary hypertension   [place], [place], MD   2 months ago Acute intractable headache, unspecified headache type   [facility] Thedore Mins, [place], PA-C   4 months ago Bruit of left carotid artery   [facility] Jerrol Banana., MD   5 months ago [place] [facility], Retia Passe., MD       Future Appointments             In 1 week Simmons-Robinson, Riki Sheer, MD [facility], [place]   In 3 weeks Simmons-Robinson, Riki Sheer, MD [facility], [place]

## 2022-05-24 DIAGNOSIS — R002 Palpitations: Secondary | ICD-10-CM

## 2022-05-24 DIAGNOSIS — I480 Paroxysmal atrial fibrillation: Secondary | ICD-10-CM | POA: Diagnosis not present

## 2022-05-29 NOTE — Progress Notes (Unsigned)
I,Joseline E Rosas,acting as a scribe for [employer], MD.,have documented all relevant documentation on the behalf of Eulis Foster, MD,as directed by  Eulis Foster, MD while in the presence of Eulis Foster, MD.   Established patient visit   Patient: Deanna Williams   DOB: 1929/04/17   89 y.o. Female  MRN: [id number] Visit Date: 05/30/2022  Today's healthcare provider: Eulis Foster, MD   Chief Complaint  Patient presents with   follow-up HTN   Subjective    HPI   Hypertension, follow-up  BP Readings from Last 3 Encounters:  05/30/22 (!) 150/92  05/20/22 (!) 160/90  04/28/22 139/76   Wt Readings from Last 3 Encounters:  05/30/22 146 lb 12.8 oz (66.6 kg)  05/20/22 146 lb (66.2 kg)  04/28/22 146 lb (66.2 kg)     She was last seen for hypertension 3 months ago.  BP at that visit was 167/71. Management since that visit includes continue current medications.  She reports excellent compliance with treatment.  Outside blood pressures are not being checked. Has been to other doctors and the Cardiologist increased the Amlodipine to 7.5 mg but patient still taking 5 mg.  Symptoms: No chest pain No chest pressure  No palpitations No syncope  No dyspnea No orthopnea  No paroxysmal nocturnal dyspnea No lower extremity edema   Pertinent labs Lab Results  Component Value Date   CHOL 219 (H) 07/06/2021   HDL 82 07/06/2021   LDLCALC 123 (H) 07/06/2021   TRIG 82 07/06/2021   CHOLHDL 2.7 07/06/2021   Lab Results  Component Value Date   NA 137 03/03/2022   K 4.2 03/03/2022   CREATININE 0.84 03/03/2022   GFRNONAA >60 03/03/2022   GLUCOSE 125 (H) 03/03/2022   TSH 2.886 03/03/2022     The ASCVD Risk score (Arnett DK, et al., 2019) failed to calculate for the following reasons:   The 2019 ASCVD risk score is only valid for ages 36 to 73   The patient has a prior MI or stroke  diagnosis  ---------------------------------------------------------------------------------------------------  Dry Mouth  She reports that mouth feels very dry  Biotene Mouthwash does not help  She has not tried other medications  She reports tongue is dry since before starting the amitriptyline She is not having trouble with swallowing   She drinks 32 oz of water per day  She drinks a lot of coffee    Medications: Outpatient Medications Prior to Visit  Medication Sig   amitriptyline (ELAVIL) 25 MG tablet Take 1 tablet (25 mg total) by mouth at bedtime.   amLODipine (NORVASC) 5 MG tablet Take 1.5 tablets (7.5 mg total) by mouth daily.   apixaban (ELIQUIS) 5 MG TABS tablet Take 1 tablet (5 mg total) by mouth 2 (two) times daily.   Cholecalciferol (VITAMIN D3) 25 MCG (1000 UT) CAPS Take 1,000 Units by mouth daily.   cyanocobalamin (VITAMIN B12) 1000 MCG/ML injection Inject 1,000 mcg into the muscle every 30 (thirty) days.   cyanocobalamin 1000 MCG tablet Take 1,000 mcg by mouth daily.   gabapentin (NEURONTIN) 100 MG capsule Take 1 capsule (100 mg total) by mouth at bedtime.   No facility-administered medications prior to visit.    Review of Systems     Objective    BP (!) 150/92 (BP Location: Right Arm, Patient Position: Sitting, Cuff Size: Normal)   Pulse 76   Temp 97.7 F (36.5 C) (Oral)   Resp 16   Wt 146 lb 12.8 oz (  66.6 kg)   BMI 26.00 kg/m    Physical Exam Vitals reviewed.  Constitutional:      General: She is not in acute distress.    Appearance: Normal appearance. She is not ill-appearing, toxic-appearing or diaphoretic.  HENT:     Mouth/Throat:     Mouth: Mucous membranes are moist. No oral lesions.     Comments: Saliva along gum line  Tongue appears dry, does not appear swollen  Normal dentition  Sticky saliva on tongue  Eyes:     Conjunctiva/sclera: Conjunctivae normal.  Cardiovascular:     Rate and Rhythm: Normal rate and regular rhythm.      Pulses: Normal pulses.     Heart sounds: Normal heart sounds. No murmur heard.    No friction rub. No gallop.  Pulmonary:     Effort: Pulmonary effort is normal. No respiratory distress.     Breath sounds: Normal breath sounds. No stridor. No wheezing, rhonchi or rales.  Abdominal:     General: Bowel sounds are normal. There is no distension.     Palpations: Abdomen is soft.     Tenderness: There is no abdominal tenderness.  Musculoskeletal:     Right lower leg: No edema.     Left lower leg: No edema.  Skin:    Findings: No erythema or rash.  Neurological:     Mental Status: She is alert and oriented to person, place, and time.      No results found for any visits on 05/30/22.  Assessment & Plan     Problem List Items Addressed This Visit       Cardiovascular and Mediastinum   BP (high blood pressure) - Primary    Monitoring both in office and at home  BP is not at goal, elevated in office  Patient is asymptomatic  Reviewed cardiology recommendations for patient to increase amlodipine dose from '5mg'$  to 7.5 mg daily  Unable to achieve this dose  Prescribed 2.'5mg'$  of amlodipine for patient to add to '5mg'$   She will follow up in our clinic as scheduled on 06/16/22 and follow up with cardiology in a few days as scheduled       Relevant Medications   amLODipine (NORVASC) 2.5 MG tablet     Digestive   Mouth dryness    Exam consistent with elements of dry mouth  Recommended hard candy, increasing water intake and prescribed vitamin C lozenges to use once daily  Recommended discussing with dentist if symptoms persist  Also discussed with patient that the TCA prescription could contribute to symptoms of dry mouth         Return in about 17 days (around 06/16/2022).      I, Eulis Foster, MD, have reviewed all documentation for this visit.  Portions of this information were initially documented by the CMA and reviewed by me for thoroughness and accuracy.       Eulis Foster, MD  [facility] [phone] (phone) [phone] (fax)  [place]

## 2022-05-30 ENCOUNTER — Encounter: Payer: Self-pay | Admitting: Family Medicine

## 2022-05-30 ENCOUNTER — Ambulatory Visit (INDEPENDENT_AMBULATORY_CARE_PROVIDER_SITE_OTHER): Payer: PPO | Admitting: Family Medicine

## 2022-05-30 VITALS — BP 150/92 | HR 76 | Temp 97.7°F | Resp 16 | Wt 146.8 lb

## 2022-05-30 DIAGNOSIS — M792 Neuralgia and neuritis, unspecified: Secondary | ICD-10-CM

## 2022-05-30 DIAGNOSIS — R682 Dry mouth, unspecified: Secondary | ICD-10-CM | POA: Insufficient documentation

## 2022-05-30 DIAGNOSIS — Z95820 Peripheral vascular angioplasty status with implants and grafts: Secondary | ICD-10-CM

## 2022-05-30 DIAGNOSIS — I10 Essential (primary) hypertension: Secondary | ICD-10-CM

## 2022-05-30 DIAGNOSIS — I4891 Unspecified atrial fibrillation: Secondary | ICD-10-CM

## 2022-05-30 MED ORDER — ASCORBIC ACID 60 MG MT LOZG: 1.0000 | LOZENGE | Freq: Every day | OROMUCOSAL | 0 refills | Status: DC

## 2022-05-30 MED ORDER — AMLODIPINE BESYLATE 2.5 MG PO TABS: 2.5000 mg | ORAL_TABLET | Freq: Every day | ORAL | 1 refills | Status: DC

## 2022-05-30 NOTE — Patient Instructions (Addendum)
Please start taking the '5mg'$  amlodipine tablets along with 2.'5mg'$  for a total of 7.'5mg'$    I will follow up with you for your blood pressure on 06/16/22   I have prescribed vitamin C lozenges for you to use daily to help with your dry mouth symptoms    Please use biotene mouth wash, monitor caffeine intake and increase water intake to help with your symptoms.

## 2022-05-30 NOTE — Assessment & Plan Note (Signed)
Exam consistent with elements of dry mouth  Recommended hard candy, increasing water intake and prescribed vitamin C lozenges to use once daily  Recommended discussing with dentist if symptoms persist  Also discussed with patient that the TCA prescription could contribute to symptoms of dry mouth

## 2022-05-30 NOTE — Assessment & Plan Note (Signed)
Monitoring both in office and at home  BP is not at goal, elevated in office  Patient is asymptomatic  Reviewed cardiology recommendations for patient to increase amlodipine dose from '5mg'$  to 7.5 mg daily  Unable to achieve this dose  Prescribed 2.'5mg'$  of amlodipine for patient to add to '5mg'$   She will follow up in our clinic as scheduled on 06/16/22 and follow up with cardiology in a few days as scheduled

## 2022-05-31 ENCOUNTER — Ambulatory Visit: Payer: PPO | Attending: Internal Medicine

## 2022-05-31 DIAGNOSIS — I480 Paroxysmal atrial fibrillation: Secondary | ICD-10-CM

## 2022-05-31 DIAGNOSIS — R0609 Other forms of dyspnea: Secondary | ICD-10-CM | POA: Diagnosis not present

## 2022-06-01 LAB — ECHOCARDIOGRAM COMPLETE
AR max vel: 1.12 cm2
AV Area VTI: 1.25 cm2
AV Area mean vel: 1.12 cm2
AV Mean grad: 6 mmHg
AV Peak grad: 10.8 mmHg
Ao pk vel: 1.64 m/s
Area-P 1/2: 2.64 cm2
S' Lateral: 2.1 cm

## 2022-06-08 DIAGNOSIS — R200 Anesthesia of skin: Secondary | ICD-10-CM | POA: Diagnosis not present

## 2022-06-13 DIAGNOSIS — R202 Paresthesia of skin: Secondary | ICD-10-CM | POA: Diagnosis not present

## 2022-06-13 DIAGNOSIS — R002 Palpitations: Secondary | ICD-10-CM | POA: Diagnosis not present

## 2022-06-13 DIAGNOSIS — I480 Paroxysmal atrial fibrillation: Secondary | ICD-10-CM | POA: Diagnosis not present

## 2022-06-13 DIAGNOSIS — R200 Anesthesia of skin: Secondary | ICD-10-CM | POA: Diagnosis not present

## 2022-06-15 NOTE — Progress Notes (Unsigned)
I,Joseline E Rosas,acting as a scribe for [employer], MD.,have documented all relevant documentation on the behalf of Eulis Foster, MD,as directed by  Eulis Foster, MD while in the presence of Eulis Foster, MD.   Established patient visit   Patient: Deanna Williams   DOB: 1928/05/27   89 y.o. Female  MRN: [id number] Visit Date: 06/16/2022  Today's healthcare provider: Eulis Foster, MD   Chief Complaint  Patient presents with   follow-Up HTN   Subjective    HPI  Hypertension, follow-up  BP Readings from Last 3 Encounters:  06/16/22 130/77  05/30/22 (!) 150/92  05/20/22 (!) 160/90   Wt Readings from Last 3 Encounters:  06/16/22 147 lb 8 oz (66.9 kg)  05/30/22 146 lb 12.8 oz (66.6 kg)  05/20/22 146 lb (66.2 kg)     She was last seen for hypertension 17 days ago.  BP at that visit was 150/92. Management since that visit includes starting amlodipine 7.'5mg'$  daily.  She reports excellent compliance with treatment.  Outside blood pressures are not being checked.  Symptoms: No chest pain No chest pressure  No palpitations No syncope  No dyspnea No orthopnea  No paroxysmal nocturnal dyspnea No lower extremity edema   Pertinent labs Lab Results  Component Value Date   CHOL 219 (H) 07/06/2021   HDL 82 07/06/2021   LDLCALC 123 (H) 07/06/2021   TRIG 82 07/06/2021   CHOLHDL 2.7 07/06/2021   Lab Results  Component Value Date   NA 137 03/03/2022   K 4.2 03/03/2022   CREATININE 0.84 03/03/2022   GFRNONAA >60 03/03/2022   GLUCOSE 125 (H) 03/03/2022   TSH 2.886 03/03/2022     The ASCVD Risk score (Arnett DK, et al., 2019) failed to calculate for the following reasons:   The 2019 ASCVD risk score is only valid for ages 37 to 37   The patient has a prior MI or stroke diagnosis  ---------------------------------------------------------------------------------------------------   Medications: Outpatient  Medications Prior to Visit  Medication Sig   amitriptyline (ELAVIL) 25 MG tablet Take 1 tablet (25 mg total) by mouth at bedtime.   amLODipine (NORVASC) 2.5 MG tablet Take 1 tablet (2.5 mg total) by mouth daily.   amLODipine (NORVASC) 5 MG tablet Take 1.5 tablets (7.5 mg total) by mouth daily.   apixaban (ELIQUIS) 5 MG TABS tablet Take 1 tablet (5 mg total) by mouth 2 (two) times daily.   Ascorbic Acid 60 MG LOZG Use as directed 1 lozenge (60 mg total) in the mouth or throat daily.   Cholecalciferol (VITAMIN D3) 25 MCG (1000 UT) CAPS Take 1,000 Units by mouth daily.   cyanocobalamin (VITAMIN B12) 1000 MCG/ML injection Inject 1,000 mcg into the muscle every 30 (thirty) days.   cyanocobalamin 1000 MCG tablet Take 1,000 mcg by mouth daily.   gabapentin (NEURONTIN) 100 MG capsule Take 1 capsule (100 mg total) by mouth at bedtime.   No facility-administered medications prior to visit.    Review of Systems     Objective    BP 130/77 (BP Location: Left Arm, Patient Position: Sitting, Cuff Size: Normal)   Pulse 82   Temp (!) 97.5 F (36.4 C) (Oral)   Resp 16   Wt 147 lb 8 oz (66.9 kg)   BMI 26.13 kg/m    Physical Exam Vitals reviewed.  Constitutional:      General: She is not in acute distress.    Appearance: Normal appearance. She is not ill-appearing, toxic-appearing or diaphoretic.  Eyes:     Conjunctiva/sclera: Conjunctivae normal.  Cardiovascular:     Rate and Rhythm: Normal rate and regular rhythm.     Pulses: Normal pulses.     Heart sounds: Normal heart sounds. No murmur heard.    No friction rub. No gallop.  Pulmonary:     Effort: Pulmonary effort is normal. No respiratory distress.     Breath sounds: Normal breath sounds. No stridor. No wheezing, rhonchi or rales.  Abdominal:     General: Bowel sounds are normal. There is no distension.     Palpations: Abdomen is soft.     Tenderness: There is no abdominal tenderness.  Musculoskeletal:     Right lower leg: No  edema.     Left lower leg: No edema.  Skin:    Findings: No erythema or rash.  Neurological:     Mental Status: She is alert and oriented to person, place, and time.       No results found for any visits on 06/16/22.   Assessment & Plan     Problem List Items Addressed This Visit       Cardiovascular and Mediastinum   BP (high blood pressure) - Primary    Controlled, improved since last visit  BP at goal Continue amlodipine 7.'5mg'$  daily  No medications changes today          Return in about 4 months (around 10/15/2022) for AWV with PCP .      The entirety of the information documented in the History of Present Illness, Review of Systems and Physical Exam were personally obtained by me. Portions of this information were initially documented by Lyndel Pleasure, CMA and reviewed by me for thoroughness and accuracy.Eulis Foster, MD   Eulis Foster, MD  [facility] [phone] (phone) [phone] (fax)  [place]

## 2022-06-16 ENCOUNTER — Encounter: Payer: Self-pay | Admitting: Family Medicine

## 2022-06-16 ENCOUNTER — Ambulatory Visit (INDEPENDENT_AMBULATORY_CARE_PROVIDER_SITE_OTHER): Payer: PPO | Admitting: Family Medicine

## 2022-06-16 ENCOUNTER — Other Ambulatory Visit: Payer: Self-pay

## 2022-06-16 VITALS — BP 130/77 | HR 82 | Temp 97.5°F | Resp 16 | Wt 147.5 lb

## 2022-06-16 DIAGNOSIS — I10 Essential (primary) hypertension: Secondary | ICD-10-CM

## 2022-06-16 MED ORDER — METOPROLOL SUCCINATE ER 25 MG PO TB24: 25.0000 mg | ORAL_TABLET | Freq: Every day | ORAL | 3 refills | Status: DC

## 2022-06-16 NOTE — Assessment & Plan Note (Signed)
Controlled, improved since last visit  BP at goal Continue amlodipine 7.'5mg'$  daily  No medications changes today

## 2022-06-20 ENCOUNTER — Other Ambulatory Visit: Payer: Self-pay | Admitting: Family Medicine

## 2022-06-20 DIAGNOSIS — M792 Neuralgia and neuritis, unspecified: Secondary | ICD-10-CM

## 2022-06-24 DIAGNOSIS — G5603 Carpal tunnel syndrome, bilateral upper limbs: Secondary | ICD-10-CM | POA: Diagnosis not present

## 2022-07-06 NOTE — Progress Notes (Unsigned)
Follow-up Outpatient Visit Date: 07/07/2022  Primary Care Provider: Eulis Foster, MD [address] [place] [address]  Chief Complaint: Follow-up paroxysmal atrial fibrillation and dyspnea on exertion  HPI:  Deanna Williams is a 89 y.o. female with history of cryptogenic stroke with subsequent discovery of paroxysmal atrial fibrillation, hypertension, and hypolipidemia, who presents for follow-up of atrial fibrillation and hypertension.  She was previously followed in [facility] by Dr. Curt Bears but elected to transition her care to [facility] earlier this year.  At our visit in early January, she was feeling well other than numbness and tingling in her right leg as well as swelling/tightness in both legs (right > left).  She reported sporadic palpitations as well.  We agreed to place a 14-day event monitor to further assess her palpitations, which showed predominantly sinus rhythm with only brief episodes of atrial fibrillation (a-fib burden < 1%; longest episode lasted 3:46 minutes)..  We also performed an echocardiogram for evaluation of progressive exertional dyspnea.  This showed normal LVEF with mild pulmonary hypertension and moderate tricuspid regurgitation.  Amlodipine was increased to 7.5 mg daily due to suboptimal BP control.  Today, Deanna Williams reports that she is feeling well, a little better than at our prior visit.  She feels like escalation of amlodipine has made her breathing a little better.  Her family also feels like she is a bit "peppier."  She denies chest pain.  Rare sporadic palpitations are unchanged.  She notes some transient lightheadedness if she gets up too quickly but has not fallen or passed out.  Tightness around her ankles (right greater than left) is unchanged.  She has not had any frank edema.  No new focal neurologic deficits reported.  She remains compliant with medications, including apixaban.  She has not had any significant  bleeding.  --------------------------------------------------------------------------------------------------  Cardiovascular History & Procedures: Cardiovascular Problems: Stroke Paroxysmal atrial fibrillation   Risk Factors: Hypertension, hyperlipidemia, age greater than 41   Cath/PCI: None   CV Surgery: None   EP Procedures and Devices: 14-day event monitor (06/13/2022): Predominantly sinus rhythm with brief paroxysmal atrial fibrillation (burden <1%). Occasional PACs and rare PVCs were noted as well as a few brief runs of NSVT.    Non-Invasive Evaluation(s): TTE (05/31/2022): Normal LV size with mild LVH.  LVEF 60-65% with grade 1 diastolic dysfunction.  Normal RV size and function.  Mild pulmonary hypertension (RVSP 35-40 mmHg).  Moderate left atrial enlargement.  Mild MR.  Moderate TR.  Sclerotic aortic valve without stenosis or regurgitation.  CVP ~8 mmHg. Carotid Doppler (01/04/2022): No significant stenosis in either internal carotid artery. TTE (09/01/2020): Normal LV size and wall thickness.  LVEF 60-65% with grade 1 diastolic dysfunction.  No regional wall motion abnormalities.  Normal RV size and function.  Mild pulmonary hypertension.  Normal biatrial size.  Mild tricuspid regurgitation.  Normal CVP.  Recent CV Pertinent Labs: Lab Results  Component Value Date   CHOL 219 (H) 07/06/2021   HDL 82 07/06/2021   LDLCALC 123 (H) 07/06/2021   TRIG 82 07/06/2021   CHOLHDL 2.7 07/06/2021   CHOLHDL 2.5 09/02/2020   INR 1.1 09/08/2021   K 4.2 03/03/2022   MG 2.1 09/04/2020   BUN 15 03/03/2022   BUN 18 10/20/2021   CREATININE 0.84 03/03/2022   CREATININE 0.82 03/07/2017    Past medical and surgical history were reviewed and updated in EPIC.  Current Meds  Medication Sig   amitriptyline (ELAVIL) 25 MG tablet TAKE 1 TABLET  BY MOUTH EVERYDAY AT BEDTIME   amLODipine (NORVASC) 2.5 MG tablet Take 1 tablet (2.5 mg total) by mouth daily. (Patient taking differently: Take 2.5 mg  by mouth daily. Takes total of 7.5 mg daily.)   amLODipine (NORVASC) 5 MG tablet Take 1.5 tablets (7.5 mg total) by mouth daily. (Patient taking differently: Take 5 mg by mouth daily. Takes total of 7.5 mg daily.)   apixaban (ELIQUIS) 5 MG TABS tablet Take 1 tablet (5 mg total) by mouth 2 (two) times daily.   Ascorbic Acid 60 MG LOZG Use as directed 1 lozenge (60 mg total) in the mouth or throat daily.   Cholecalciferol (VITAMIN D3) 25 MCG (1000 UT) CAPS Take 1,000 Units by mouth daily.   cyanocobalamin (VITAMIN B12) 1000 MCG/ML injection Inject 1,000 mcg into the muscle every 30 (thirty) days.   cyanocobalamin 1000 MCG tablet Take 1,000 mcg by mouth daily.   gabapentin (NEURONTIN) 100 MG capsule Take 1 capsule (100 mg total) by mouth at bedtime.   metoprolol succinate (TOPROL-XL) 25 MG 24 hr tablet Take 1 tablet (25 mg total) by mouth daily. Take with or immediately following a meal.    Allergies: Patient has no known allergies.  Social History   Tobacco Use   Smoking status: Never   Smokeless tobacco: Never  Vaping Use   Vaping Use: Never used  Substance Use Topics   Alcohol use: Not Currently    Comment: 1 drink less than once a week.   Drug use: No    Family History  Problem Relation Age of Onset   Cancer Mother    Leukemia Mother    Cancer Father        colon   Hypertension Father    Arthritis Sister    Breast cancer Neg Hx     Review of Systems: A 12-system review of systems was performed and was negative except as noted in the HPI.  --------------------------------------------------------------------------------------------------  Physical Exam: BP 130/76 (BP Location: Left Arm, Patient Position: Sitting, Cuff Size: Normal)   Pulse 63   Ht 5' 3"$  (1.6 m)   Wt 150 lb (68 kg)   SpO2 95%   BMI 26.57 kg/m   General:  NAD. Neck: No JVD or HJR. Lungs: Clear to auscultation bilaterally without wheezes or crackles. Heart: Regular rate and rhythm with 2/6 systolic  murmur loudest at the right upper sternal border.  No rubs or gallops. Abdomen: Soft, nontender, nondistended. Extremities: Trace by lateral ankle edema.    EKG: Normal sinus rhythm with poor R wave progression and possible inferior infarct.  Lab Results  Component Value Date   WBC 5.1 03/03/2022   HGB 14.2 03/03/2022   HCT 43.7 03/03/2022   MCV 94.2 03/03/2022   PLT 221 03/03/2022    Lab Results  Component Value Date   NA 137 03/03/2022   K 4.2 03/03/2022   CL 109 03/03/2022   CO2 23 03/03/2022   BUN 15 03/03/2022   CREATININE 0.84 03/03/2022   GLUCOSE 125 (H) 03/03/2022   ALT 22 10/20/2021    Lab Results  Component Value Date   CHOL 219 (H) 07/06/2021   HDL 82 07/06/2021   LDLCALC 123 (H) 07/06/2021   TRIG 82 07/06/2021   CHOLHDL 2.7 07/06/2021    --------------------------------------------------------------------------------------------------  ASSESSMENT AND PLAN: Paroxysmal atrial fibrillation: Ms. Fjelstad remains minimally symptomatic, with recent event monitor demonstrating relatively brief episodes of PAF.  Overall burden was less than 1%.  We discussed role for a rhythm  control strategy but have agreed to defer this in favor of continued rate control with metoprolol and indefinite anticoagulation with apixaban.  Dyspnea on exertion: This has been longstanding and may be slightly better than at our last visit following escalation of amlodipine.  I suspect exertional dyspnea is multifactorial, including an element of diastolic dysfunction.  Underlying ischemic heart disease cannot be excluded, particularly with poor R wave progression and possible inferior infarct again noted on EKG.  We discussed further ischemia evaluation but have agreed to defer this.  Hypertension: Blood pressure upper normal today.  Continue amlodipine 7.5 mg daily and metoprolol succinate 25 mg daily.  Follow-up: Return to clinic in 6 months.  Deanna Bush, MD 07/07/2022 10:55  AM

## 2022-07-07 ENCOUNTER — Ambulatory Visit: Payer: PPO | Attending: Internal Medicine | Admitting: Internal Medicine

## 2022-07-07 ENCOUNTER — Encounter: Payer: Self-pay | Admitting: Internal Medicine

## 2022-07-07 VITALS — BP 130/76 | HR 63 | Ht 63.0 in | Wt 150.0 lb

## 2022-07-07 DIAGNOSIS — R0609 Other forms of dyspnea: Secondary | ICD-10-CM | POA: Diagnosis not present

## 2022-07-07 DIAGNOSIS — I480 Paroxysmal atrial fibrillation: Secondary | ICD-10-CM | POA: Diagnosis not present

## 2022-07-07 DIAGNOSIS — I10 Essential (primary) hypertension: Secondary | ICD-10-CM

## 2022-07-07 NOTE — Patient Instructions (Addendum)
Medication Instructions:  Your Physician recommend you continue on your current medication as directed.    *If you need a refill on your cardiac medications before your next appointment, please call your pharmacy*   Lab Work: None ordered today   Testing/Procedures: None ordered today   Follow-Up: At [facility], you and your health needs are our priority.  As part of our continuing mission to provide you with exceptional heart care, we have created designated Provider Care Teams.  These Care Teams include your primary Cardiologist (physician) and Advanced Practice Providers (APPs -  Physician Assistants and Nurse Practitioners) who all work together to provide you with the care you need, when you need it.  We recommend signing up for the patient portal called "MyChart".  Sign up information is provided on this After Visit Summary.  MyChart is used to connect with patients for Virtual Visits (Telemedicine).  Patients are able to view lab/test results, encounter notes, upcoming appointments, etc.  Non-urgent messages can be sent to your provider as well.   To learn more about what you can do with MyChart, go to [email or web address].    Your next appointment:   6 month(s)  Provider:   You may see Nelva Bush, MD or one of the following Advanced Practice Providers on your designated Care Team:   Murray Hodgkins, NP Christell Faith, PA-C Cadence Kathlen Mody, PA-C Gerrie Nordmann, NP

## 2022-07-12 ENCOUNTER — Other Ambulatory Visit: Payer: Self-pay | Admitting: Family Medicine

## 2022-07-12 DIAGNOSIS — M792 Neuralgia and neuritis, unspecified: Secondary | ICD-10-CM

## 2022-07-22 DIAGNOSIS — G5603 Carpal tunnel syndrome, bilateral upper limbs: Secondary | ICD-10-CM | POA: Diagnosis not present

## 2022-07-22 DIAGNOSIS — G629 Polyneuropathy, unspecified: Secondary | ICD-10-CM | POA: Diagnosis not present

## 2022-07-22 DIAGNOSIS — Z8673 Personal history of transient ischemic attack (TIA), and cerebral infarction without residual deficits: Secondary | ICD-10-CM | POA: Diagnosis not present

## 2022-07-22 DIAGNOSIS — M7130 Other bursal cyst, unspecified site: Secondary | ICD-10-CM | POA: Diagnosis not present

## 2022-07-27 ENCOUNTER — Ambulatory Visit (INDEPENDENT_AMBULATORY_CARE_PROVIDER_SITE_OTHER): Payer: PPO | Admitting: Physician Assistant

## 2022-07-27 ENCOUNTER — Encounter: Payer: Self-pay | Admitting: Physician Assistant

## 2022-07-27 ENCOUNTER — Ambulatory Visit: Payer: Self-pay | Admitting: *Deleted

## 2022-07-27 VITALS — BP 126/71 | HR 96 | Temp 97.9°F | Wt 151.3 lb

## 2022-07-27 DIAGNOSIS — R21 Rash and other nonspecific skin eruption: Secondary | ICD-10-CM | POA: Diagnosis not present

## 2022-07-27 MED ORDER — TRIAMCINOLONE ACETONIDE 0.1 % EX CREA: 1.0000 | TOPICAL_CREAM | Freq: Two times a day (BID) | CUTANEOUS | 0 refills | Status: DC

## 2022-07-27 NOTE — Telephone Encounter (Signed)
  Chief Complaint: Rash that is spreading Symptoms: Real red rash on arms, legs and back.   She started using Oil of Olay a week ago and this rash has come up after using the new lotion. Frequency: For a week and it's spreading and itching. Pertinent Negatives: Patient denies burning or starting on new medications. Disposition: [] ED /[] Urgent Care (no appt availability in office) / [x] Appointment(In office/virtual)/ []  [place] [facility]/ [] Home Care/ [] Refused Recommended Disposition /[] [place] [facility]/ []  Follow-up with PCP Additional Notes: Appt. Made for 10:00 this morning with Mardene Speak, PA-C Encouraged her to take an antihistamine but she is on many medications and afraid to take anything without checking with the dr.   She is on Eloquis and has to be careful what she takes.

## 2022-07-27 NOTE — Telephone Encounter (Signed)
Reason for Disposition  SEVERE itching (i.e., interferes with sleep, normal activities or school)  Answer Assessment - Initial Assessment Questions 1. APPEARANCE of RASH: "Describe the rash." (e.g., spots, blisters, raised areas, skin peeling, scaly)     I have a rash on arms, legs, back.    It itches.    It's getting worse. My lotions have been changed from [place].   The rash started when I switched to [place].      It's been a week.   I take a lot of medication.    2. SIZE: "How big are the spots?" (e.g., tip of pen, eraser, coin; inches, centimeters)     Real red and itches   spotty 3. LOCATION: "Where is the rash located?"     Arms, back, legs.    Not on my face.    4. COLOR: "What color is the rash?" (Note: It is difficult to assess rash color in people with darker-colored skin. When this situation occurs, simply ask the caller to describe what they see.)     Real red    It itches but does not burn 5. ONSET: "When did the rash begin?"     3-4 days ago.   I've been using the Oil of Olay for a week now. 6. FEVER: "Do you have a fever?" If Yes, ask: "What is your temperature, how was it measured, and when did it start?"     No 7. ITCHING: "Does the rash itch?" If Yes, ask: "How bad is the itch?" (Scale 1-10; or mild, moderate, severe)     Yes 8. CAUSE: "What do you think is causing the rash?"     I'm not sure.   Maybe the Oil of Olay 9. MEDICINE FACTORS: "Have you started any new medicines within the last 2 weeks?" (e.g., antibiotics)      No 10. OTHER SYMPTOMS: "Do you have any other symptoms?" (e.g., dizziness, headache, sore throat, joint pain)       No just the rashes and itching. 11. PREGNANCY: "Is there any chance you are pregnant?" "When was your last menstrual period?"       N/A due to age  Protocols used: Rash or Redness - [facility]

## 2022-07-27 NOTE — Progress Notes (Signed)
Established patient visit   Patient: Deanna Williams   DOB: 1928-10-22   89 y.o. Female  MRN: [id number] Visit Date: 07/27/2022  Today's healthcare provider: Mardene Speak, PA-C   CC: rash  Subjective    HPI  Rash: Patient complains of rash involving the bilateral arm, back, and bilateral lower leg. Rash started 2 day ago. Appearance of rash at onset: Color of lesion(s): red. Rash has changed over time Initial distribution: bilateral ankle, bilateral arm, back, bilateral hand, bilateral lower leg.  Discomfort associated with rash:  is pruritic.  Associated symptoms: none. Denies: none. Patient has not had previous evaluation of rash. Patient has not had previous treatment.  Patient has not had contacts with similar rash. Patient has identified precipitant that rash started after new body lotion Denies having other new exposures (soaps, laundry detergents, foods, medications, plants, insects or animals.)   Medications: Outpatient Medications Prior to Visit  Medication Sig   amitriptyline (ELAVIL) 25 MG tablet TAKE 1 TABLET BY MOUTH EVERYDAY AT BEDTIME   amLODipine (NORVASC) 2.5 MG tablet Take 2.5 mg by mouth daily. In addition to 5 mg for a total of 7.5 mg daily   amLODipine (NORVASC) 5 MG tablet Take 5 mg by mouth daily. In addition to 2.5 mg for a total of 7.5 mg   apixaban (ELIQUIS) 5 MG TABS tablet Take 1 tablet (5 mg total) by mouth 2 (two) times daily.   Ascorbic Acid 60 MG LOZG Use as directed 1 lozenge (60 mg total) in the mouth or throat daily.   Cholecalciferol (VITAMIN D3) 25 MCG (1000 UT) CAPS Take 1,000 Units by mouth daily.   cyanocobalamin (VITAMIN B12) 1000 MCG/ML injection Inject 1,000 mcg into the muscle every 30 (thirty) days.   cyanocobalamin 1000 MCG tablet Take 1,000 mcg by mouth daily. (Patient not taking: Reported on 07/27/2022)   gabapentin (NEURONTIN) 100 MG capsule Take 1 capsule (100 mg total) by mouth at bedtime.   metoprolol succinate (TOPROL-XL) 25 MG  24 hr tablet Take 1 tablet (25 mg total) by mouth daily. Take with or immediately following a meal.   No facility-administered medications prior to visit.    Review of Systems  All other systems reviewed and are negative.  Except see HPI     Objective    BP 126/71   Pulse 96   Temp 97.9 F (36.6 C)   Wt 151 lb 4.8 oz (68.6 kg)   BMI 26.80 kg/m    Physical Exam Vitals reviewed.  Constitutional:      General: She is not in acute distress.    Appearance: Normal appearance. She is well-developed. She is not diaphoretic.  HENT:     Head: Normocephalic and atraumatic.  Eyes:     General: No scleral icterus.    Conjunctiva/sclera: Conjunctivae normal.  Neck:     Thyroid: No thyromegaly.  Cardiovascular:     Rate and Rhythm: Normal rate and regular rhythm.     Pulses: Normal pulses.     Heart sounds: Normal heart sounds. No murmur heard. Pulmonary:     Effort: Pulmonary effort is normal. No respiratory distress.     Breath sounds: Normal breath sounds. No wheezing, rhonchi or rales.  Musculoskeletal:     Cervical back: Neck supple.     Right lower leg: No edema.     Left lower leg: No edema.  Lymphadenopathy:     Cervical: No cervical adenopathy.  Skin:    General: Skin  is warm and dry.     Findings: Rash present.  Neurological:     Mental Status: She is alert and oriented to person, place, and time. Mental status is at baseline.  Psychiatric:        Behavior: Behavior normal.        Thought Content: Thought content normal.        Judgment: Judgment normal.      No results found for any visits on 07/27/22.  Assessment & Plan     Rash Unclear etiology, most likely secondary to a new lotion Pt is prediabetic, we will hold on systemic steroids Called in topical steroids Cautioned about secondary bacterial infection Advised to use cold compresses, avoid scratching The patient was advised to call back or seek an in-person evaluation if the symptoms worsen or if  the condition fails to improve as anticipated.  No follow-ups on file.     I discussed the assessment and treatment plan with the patient. The patient was provided an opportunity to ask questions and all were answered. The patient agreed with the plan and demonstrated an understanding of the instructions.  I, Mardene Speak, PA-C have reviewed all documentation for this visit. The documentation on 07/27/22 for the exam, diagnosis, procedures, and orders are all accurate and complete.  Mardene Speak, [facility], [place] [phone] (phone) [phone] (fax)    Mardene Speak, PA-C  [place] [phone] (phone) [phone] (fax)  [place]

## 2022-08-13 ENCOUNTER — Other Ambulatory Visit: Payer: Self-pay | Admitting: Family Medicine

## 2022-08-13 DIAGNOSIS — M792 Neuralgia and neuritis, unspecified: Secondary | ICD-10-CM

## 2022-08-15 NOTE — Telephone Encounter (Signed)
Requested Prescriptions  Pending Prescriptions Disp Refills   amitriptyline (ELAVIL) 25 MG tablet [Pharmacy Med Name: AMITRIPTYLINE HCL 25 MG TAB] 90 tablet 0    Sig: TAKE 1 TABLET BY MOUTH EVERYDAY AT BEDTIME     Psychiatry:  Antidepressants - Heterocyclics (TCAs) Passed - 08/13/2022  1:16 AM      Passed - Valid encounter within last 6 months    Recent Outpatient Visits           2 weeks ago [place], [place], PA-C   2 months ago Primary hypertension   [place], [place], MD   2 months ago Primary hypertension   [place], [place], MD   3 months ago Neuropathic pain   [place] T, FNP   5 months ago Primary hypertension   [place] [facility] Eulis Foster, MD

## 2022-09-03 ENCOUNTER — Other Ambulatory Visit: Payer: Self-pay | Admitting: Family Medicine

## 2022-10-17 ENCOUNTER — Ambulatory Visit (INDEPENDENT_AMBULATORY_CARE_PROVIDER_SITE_OTHER): Payer: PPO | Admitting: Family Medicine

## 2022-10-17 ENCOUNTER — Encounter: Payer: Self-pay | Admitting: Family Medicine

## 2022-10-17 VITALS — BP 134/72 | HR 59 | Ht 63.0 in | Wt 148.7 lb

## 2022-10-17 DIAGNOSIS — R5383 Other fatigue: Secondary | ICD-10-CM | POA: Insufficient documentation

## 2022-10-17 DIAGNOSIS — I10 Essential (primary) hypertension: Secondary | ICD-10-CM | POA: Diagnosis not present

## 2022-10-17 DIAGNOSIS — Z0000 Encounter for general adult medical examination without abnormal findings: Secondary | ICD-10-CM | POA: Insufficient documentation

## 2022-10-17 NOTE — Assessment & Plan Note (Addendum)
Annual wellness visit completed today including all of the following: -Reviewed patient's family medical history -Reviewed and updated patient's list of medical providers -Completed assessment of cognitive impairment -Completed assessment of patient's functional ability -Provided patient with recommendations for health screening services as well as vaccines Health risk assessment completed and reviewed -Discussed recommendations for well-balanced diet in addition to 150 minutes of physical activity per week, encouraged patient to continue walking the dog throughout the day

## 2022-10-17 NOTE — Assessment & Plan Note (Addendum)
Controlled BP at goal Continue current medications at current doses No medications changes today  CMP collected today  

## 2022-10-17 NOTE — Patient Instructions (Signed)
It was a pleasure seeing you today.  We will follow up with results of labs once they are available.    Please try the Voltaren gel or Biofreeze for the leg pain.     Health Maintenance After Age 87 After age 61, you are at a higher risk for certain long-term diseases and infections as well as injuries from falls. Falls are a major cause of broken bones and head injuries in people who are older than age 82. Getting regular preventive care can help to keep you healthy and well. Preventive care includes getting regular testing and making lifestyle changes as recommended by your health care provider. Talk with your health care provider about: Which screenings and tests you should have. A screening is a test that checks for a disease when you have no symptoms. A diet and exercise plan that is right for you. What should I know about screenings and tests to prevent falls? Screening and testing are the best ways to find a health problem early. Early diagnosis and treatment give you the best chance of managing medical conditions that are common after age 83. Certain conditions and lifestyle choices may make you more likely to have a fall. Your health care provider may recommend: Regular vision checks. Poor vision and conditions such as cataracts can make you more likely to have a fall. If you wear glasses, make sure to get your prescription updated if your vision changes. Medicine review. Work with your health care provider to regularly review all of the medicines you are taking, including over-the-counter medicines. Ask your health care provider about any side effects that may make you more likely to have a fall. Tell your health care provider if any medicines that you take make you feel dizzy or sleepy. Strength and balance checks. Your health care provider may recommend certain tests to check your strength and balance while standing, walking, or changing positions. Foot health exam. Foot pain and  numbness, as well as not wearing proper footwear, can make you more likely to have a fall. Screenings, including: Osteoporosis screening. Osteoporosis is a condition that causes the bones to get weaker and break more easily. Blood pressure screening. Blood pressure changes and medicines to control blood pressure can make you feel dizzy. Depression screening. You may be more likely to have a fall if you have a fear of falling, feel depressed, or feel unable to do activities that you used to do. Alcohol use screening. Using too much alcohol can affect your balance and may make you more likely to have a fall. Follow these instructions at home: Lifestyle Do not drink alcohol if: Your health care provider tells you not to drink. If you drink alcohol: Limit how much you have to: 0-1 drink a day for women. 0-2 drinks a day for men. Know how much alcohol is in your drink. In the [place] one drink equals one 12 oz bottle of beer (355 mL), one 5 oz glass of wine (148 mL), or one 1 oz glass of hard liquor (44 mL). Do not use any products that contain nicotine or tobacco. These products include cigarettes, chewing tobacco, and vaping devices, such as e-cigarettes. If you need help quitting, ask your health care provider. Activity  Follow a regular exercise program to stay fit. This will help you maintain your balance. Ask your health care provider what types of exercise are appropriate for you. If you need a cane or walker, use it as recommended by your health care  provider. Wear supportive shoes that have nonskid soles. Safety  Remove any tripping hazards, such as rugs, cords, and clutter. Install safety equipment such as grab bars in bathrooms and safety rails on stairs. Keep rooms and walkways well-lit. General instructions Talk with your health care provider about your risks for falling. Tell your health care provider if: You fall. Be sure to tell your health care provider about all falls, even  ones that seem minor. You feel dizzy, tiredness (fatigue), or off-balance. Take over-the-counter and prescription medicines only as told by your health care provider. These include supplements. Eat a healthy diet and maintain a healthy weight. A healthy diet includes low-fat dairy products, low-fat (lean) meats, and fiber from whole grains, beans, and lots of fruits and vegetables. Stay current with your vaccines. Schedule regular health, dental, and eye exams. Summary Having a healthy lifestyle and getting preventive care can help to protect your health and wellness after age 44. Screening and testing are the best way to find a health problem early and help you avoid having a fall. Early diagnosis and treatment give you the best chance for managing medical conditions that are more common for people who are older than age 67. Falls are a major cause of broken bones and head injuries in people who are older than age 37. Take precautions to prevent a fall at home. Work with your health care provider to learn what changes you can make to improve your health and wellness and to prevent falls. This information is not intended to replace advice given to you by your health care provider. Make sure you discuss any questions you have with your health care provider. Document Revised: 09/21/2020 Document Reviewed: 09/21/2020 Elsevier Patient Education  2024 [employer].

## 2022-10-17 NOTE — Progress Notes (Signed)
I,Sha'taria Tyson,acting as a Neurosurgeon for [employer], MD.,have documented all relevant documentation on the behalf of Ronnald Ramp, MD,as directed by  Ronnald Ramp, MD while in the presence of Ronnald Ramp, MD.   Annual Wellness Visit     Patient: Deanna Williams, Female    DOB: 07-28-1928, 89 y.o.   MRN: [id number] Visit Date: 10/17/2022  Today's Provider: Ronnald Ramp, MD   No chief complaint on file.  Subjective    Deanna Williams is a 89 y.o. female who presents today for her Annual Wellness Visit.  She reports consuming a general diet.  The patient reports taking her dog walking several times a day for at least 1/4 a mile.  She generally feels poorly. She reports sleeping well. She does have additional problems to discuss today.   HPI  Recommended Tetanus (last had Td in 2019) and updated COVID vaccine (last booster in 2022)   Leg Swelling  Patient reports that she continues to have lower extremity edema  Usually her right leg is more swollen than the left  She reports having chronic pain in the right LE  She prefers to avoid taking medications  She describes the pain as worsening along with more swelling   Fatigue Reports feeling fatigued and is snapping more  She does not currently take a multivitamin     Medications: Outpatient Medications Prior to Visit  Medication Sig   amitriptyline (ELAVIL) 25 MG tablet TAKE 1 TABLET BY MOUTH EVERYDAY AT BEDTIME   amLODipine (NORVASC) 2.5 MG tablet TAKE 1 TABLET BY MOUTH EVERY DAY   amLODipine (NORVASC) 5 MG tablet Take 5 mg by mouth daily. In addition to 2.5 mg for a total of 7.5 mg   apixaban (ELIQUIS) 5 MG TABS tablet Take 1 tablet (5 mg total) by mouth 2 (two) times daily.   Ascorbic Acid 60 MG LOZG Use as directed 1 lozenge (60 mg total) in the mouth or throat daily.   Cholecalciferol (VITAMIN D3) 25 MCG (1000 UT) CAPS Take 1,000 Units by mouth daily.    cyanocobalamin (VITAMIN B12) 1000 MCG/ML injection Inject 1,000 mcg into the muscle every 30 (thirty) days.   cyanocobalamin 1000 MCG tablet Take 1,000 mcg by mouth daily.   gabapentin (NEURONTIN) 100 MG capsule Take 1 capsule (100 mg total) by mouth at bedtime.   metoprolol succinate (TOPROL-XL) 25 MG 24 hr tablet Take 1 tablet (25 mg total) by mouth daily. Take with or immediately following a meal.   [DISCONTINUED] triamcinolone cream (KENALOG) 0.1 % Apply 1 Application topically 2 (two) times daily. (Patient not taking: Reported on 10/17/2022)   No facility-administered medications prior to visit.    No Known Allergies  Patient Care Team: Ronnald Ramp, MD as PCP - General (Family Medicine) Regan Lemming, MD as PCP - Electrophysiology (Cardiology) End, Cristal Deer, MD as PCP - Cardiology (Cardiology) Sallee Lange, MD as Consulting Physician (Ophthalmology) Deirdre Evener, MD as Consulting Physician (Dermatology)  Review of Systems  Constitutional:  Positive for activity change and fatigue.  Respiratory:  Positive for shortness of breath.   Cardiovascular:  Positive for chest pain, palpitations and leg swelling.  Endocrine: Positive for polydipsia.  Musculoskeletal:  Positive for arthralgias.  Neurological:  Positive for light-headedness and headaches.        Objective    Vitals: BP 134/72 (BP Location: Left Arm, Patient Position: Sitting, Cuff Size: Normal)   Pulse (!) 59   Ht 5\' 3"  (1.6 m)   Wt  148 lb 11.2 oz (67.4 kg)   SpO2 96%   BMI 26.34 kg/m     Physical Exam Vitals reviewed.  Constitutional:      General: She is not in acute distress.    Appearance: Normal appearance. She is not ill-appearing, toxic-appearing or diaphoretic.  Eyes:     Conjunctiva/sclera: Conjunctivae normal.  Cardiovascular:     Rate and Rhythm: Normal rate and regular rhythm.     Pulses: Normal pulses.     Heart sounds: Normal heart sounds. No murmur heard.     No friction rub. No gallop.  Pulmonary:     Effort: Pulmonary effort is normal. No respiratory distress.     Breath sounds: Normal breath sounds. No stridor. No wheezing, rhonchi or rales.  Abdominal:     General: Bowel sounds are normal. There is no distension.     Palpations: Abdomen is soft.     Tenderness: There is no abdominal tenderness.  Musculoskeletal:     Right lower leg: No edema.     Left lower leg: Edema present.     Comments: No erythema of either lower extremity   Skin:    Findings: No erythema or rash.  Neurological:     Mental Status: She is alert and oriented to person, place, and time.      Most recent functional status assessment:    10/17/2022    8:29 AM  In your present state of health, do you have any difficulty performing the following activities:  Hearing? 0  Vision? 1  Difficulty concentrating or making decisions? 0  Walking or climbing stairs? 1  Dressing or bathing? 0  Doing errands, shopping? 1   Most recent fall risk assessment:    10/17/2022    8:29 AM  Fall Risk   Falls in the past year? 0  Number falls in past yr: 0  Injury with Fall? 0  Risk for fall due to : No Fall Risks  Follow up Falls evaluation completed    Most recent depression screenings:    10/17/2022    8:29 AM 06/16/2022    8:18 AM  PHQ 2/9 Scores  PHQ - 2 Score 2 0  PHQ- 9 Score 6 3   Most recent cognitive screening:    01/21/2020    9:50 AM  6CIT Screen  What Year? 0 points  What month? 0 points  What time? 0 points  Count back from 20 0 points  Months in reverse 0 points  Repeat phrase 4 points  Total Score 4 points   Most recent Audit-C alcohol use screening    10/17/2022    8:29 AM  Alcohol Use Disorder Test (AUDIT)  1. How often do you have a drink containing alcohol? 2  2. How many drinks containing alcohol do you have on a typical day when you are drinking? 0  3. How often do you have six or more drinks on one occasion? 0  AUDIT-C Score 2   A score of  3 or more in women, and 4 or more in men indicates increased risk for alcohol abuse, EXCEPT if all of the points are from question 1   No results found for any visits on 10/17/22.  Assessment & Plan       Immunization History  Administered Date(s) Administered   Fluad Quad(high Dose 65+) 02/20/2020, 01/26/2021, 02/09/2022   Influenza, High Dose Seasonal PF 02/11/2016, 03/07/2017, 02/01/2018, 02/26/2019   Influenza-Unspecified 02/13/2014   PFIZER(Purple Top)SARS-COV-2 Vaccination 05/27/2019,  06/17/2019, 02/20/2020, 10/07/2020   PNEUMOCOCCAL CONJUGATE-20 01/26/2021   Pfizer Covid-19 Vaccine Bivalent Booster 36yrs & up 01/29/2021   Pneumococcal Conjugate-13 03/24/2014   Pneumococcal Polysaccharide-23 03/04/1998   Td 03/06/2008   Zoster Recombinat (Shingrix) 01/06/2017, 10/27/2017   Zoster, Live 07/30/2007    Health Maintenance  Topic Date Due   DTaP/Tdap/Td (2 - Tdap) 03/06/2018   COVID-19 Vaccine (6 - 2023-24 season) 01/14/2022   INFLUENZA VACCINE  12/15/2022   Medicare Annual Wellness (AWV)  10/17/2023   Pneumonia Vaccine 68+ Years old  Completed   DEXA SCAN  Completed   Zoster Vaccines- Shingrix  Completed   HPV VACCINES  Aged Out     Problem List Items Addressed This Visit       Cardiovascular and Mediastinum   BP (high blood pressure)    Controlled BP at goal Continue current medications at current doses No medications changes today  CMP collected today        Relevant Orders   Comprehensive metabolic panel   Hemoglobin A1c     Other   Other fatigue    Chronic  Suspect this is multifactorial discussed that it could be related to medications as well as heart condition  Will check TSH along with CMP and CBC  Recommended taking a daily MV       Relevant Orders   TSH + free T4   CBC   Encounter for annual wellness visit (AWV) in Medicare patient - Primary    Annual wellness visit completed today including all of the following: -Reviewed patient's family  medical history -Reviewed and updated patient's list of medical providers -Completed assessment of cognitive impairment -Completed assessment of patient's functional ability -Provided patient with recommendations for health screening services as well as vaccines Health risk assessment completed and reviewed -Discussed recommendations for well-balanced diet in addition to 150 minutes of physical activity per week, encouraged patient to continue walking the dog throughout the day          Return in about 4 months (around 02/16/2023) for chronic conditions .       The entirety of the information documented in the History of Present Illness, Review of Systems and Physical Exam were personally obtained by me. Portions of this information were initially documented by Acey Lav, CMA. I, Ronnald Ramp, MD have reviewed the documentation above for thoroughness and accuracy.    Ronnald Ramp, MD  [facility] [phone] (phone) [phone] (fax)  [facility]

## 2022-10-17 NOTE — Assessment & Plan Note (Signed)
Chronic  Suspect this is multifactorial discussed that it could be related to medications as well as heart condition  Will check TSH along with CMP and CBC  Recommended taking a daily MV

## 2022-10-18 LAB — COMPREHENSIVE METABOLIC PANEL
ALT: 11 IU/L (ref 0–32)
AST: 15 IU/L (ref 0–40)
Albumin/Globulin Ratio: 2.1 (ref 1.2–2.2)
Albumin: 4.2 g/dL (ref 3.6–4.6)
Alkaline Phosphatase: 81 IU/L (ref 44–121)
BUN/Creatinine Ratio: 16 (ref 12–28)
BUN: 14 mg/dL (ref 10–36)
Bilirubin Total: 0.6 mg/dL (ref 0.0–1.2)
CO2: 22 mmol/L (ref 20–29)
Calcium: 9.7 mg/dL (ref 8.7–10.3)
Chloride: 100 mmol/L (ref 96–106)
Creatinine, Ser: 0.85 mg/dL (ref 0.57–1.00)
Globulin, Total: 2 g/dL (ref 1.5–4.5)
Glucose: 107 mg/dL — ABNORMAL HIGH (ref 70–99)
Potassium: 4.8 mmol/L (ref 3.5–5.2)
Sodium: 135 mmol/L (ref 134–144)
Total Protein: 6.2 g/dL (ref 6.0–8.5)
eGFR: 64 mL/min/{1.73_m2} (ref >59)

## 2022-10-18 LAB — TSH+FREE T4
Free T4: 1.18 ng/dL (ref 0.82–1.77)
TSH: 3.57 u[IU]/mL (ref 0.450–4.500)

## 2022-10-18 LAB — CBC
Hematocrit: 42.6 % (ref 34.0–46.6)
Hemoglobin: 14.1 g/dL (ref 11.1–15.9)
MCH: 30.9 pg (ref 26.6–33.0)
MCHC: 33.1 g/dL (ref 31.5–35.7)
MCV: 93 fL (ref 79–97)
Platelets: 229 10*3/uL (ref 150–450)
RBC: 4.56 x10E6/uL (ref 3.77–5.28)
RDW: 12.6 % (ref 11.7–15.4)
WBC: 6.3 10*3/uL (ref 3.4–10.8)

## 2022-10-18 LAB — HEMOGLOBIN A1C
Est. average glucose Bld gHb Est-mCnc: 131 mg/dL
Hgb A1c MFr Bld: 6.2 % — ABNORMAL HIGH (ref 4.8–5.6)

## 2022-11-09 ENCOUNTER — Other Ambulatory Visit: Payer: Self-pay | Admitting: Family Medicine

## 2022-11-09 DIAGNOSIS — M792 Neuralgia and neuritis, unspecified: Secondary | ICD-10-CM

## 2022-11-09 NOTE — Telephone Encounter (Signed)
Unable to refill per protocol, Rx request is too soon  Requested Prescriptions  Pending Prescriptions Disp Refills   amitriptyline (ELAVIL) 25 MG tablet [Pharmacy Med Name: AMITRIPTYLINE HCL 25 MG TAB] 90 tablet 0    Sig: TAKE 1 TABLET BY MOUTH EVERYDAY AT BEDTIME     Psychiatry:  Antidepressants - Heterocyclics (TCAs) Passed - 11/09/2022  2:21 AM      Passed - Valid encounter within last 6 months    Recent Outpatient Visits           3 weeks ago Encounter for annual wellness visit (AWV) in Medicare patient   [place] [facility] Simmons-Robinson, [place], MD   3 months ago Rash   [place] [facility] [place], [place], PA-C   4 months ago Primary hypertension   [place] [facility] [place], [place], MD   5 months ago Primary hypertension   [place] [facility] [place], [place], MD   6 months ago Neuropathic pain   [facility] Jacky Kindle, [place]

## 2022-11-24 ENCOUNTER — Other Ambulatory Visit: Payer: Self-pay | Admitting: Family Medicine

## 2022-11-24 DIAGNOSIS — M792 Neuralgia and neuritis, unspecified: Secondary | ICD-10-CM

## 2022-11-24 NOTE — Telephone Encounter (Signed)
Medication Refill - Medication: amitriptyline (ELAVIL) 25 MG tablet   Has the patient contacted their pharmacy? Yes.   Pharmacy stated there were no refills  Preferred Pharmacy (with phone number or street name):  [id number] [place] [address] Phone: [phone]  Fax: [phone]     Has the patient been seen for an appointment in the last year OR does the patient have an upcoming appointment? No.  Agent: Please be advised that RX refills may take up to 3 business days. We ask that you follow-up with your pharmacy.  Patient has about 9 pills left

## 2022-11-25 MED ORDER — AMITRIPTYLINE HCL 25 MG PO TABS: 25.0000 mg | ORAL_TABLET | Freq: Every day | ORAL | 0 refills | Status: AC

## 2022-11-25 NOTE — Telephone Encounter (Signed)
Requested Prescriptions  Pending Prescriptions Disp Refills   amitriptyline (ELAVIL) 25 MG tablet 90 tablet 0    Sig: Take 1 tablet (25 mg total) by mouth daily.     Psychiatry:  Antidepressants - Heterocyclics (TCAs) Passed - 11/24/2022  3:14 PM      Passed - Valid encounter within last 6 months    Recent Outpatient Visits           1 month ago Encounter for annual wellness visit (AWV) in Medicare patient   [facility] Simmons-Robinson, [place], MD   4 months ago Rash   [place] [place], [place], PA-C   5 months ago Primary hypertension   [place] [facility] [place], [place], MD   5 months ago Primary hypertension   [place] [facility] [place], [place], MD   7 months ago Neuropathic pain   [facility] Jacky Kindle, [place]

## 2022-12-23 ENCOUNTER — Other Ambulatory Visit: Payer: Self-pay | Admitting: Family Medicine

## 2022-12-26 NOTE — Telephone Encounter (Signed)
Requested Prescriptions  Pending Prescriptions Disp Refills   ELIQUIS 5 MG TABS tablet [Pharmacy Med Name: ELIQUIS 5 MG TABLET] 180 tablet 1    Sig: TAKE 1 TABLET BY MOUTH TWICE A DAY     Hematology:  Anticoagulants - apixaban Passed - 12/23/2022  5:15 PM      Passed - PLT in normal range and within 360 days    Platelets  Date Value Ref Range Status  10/17/2022 229 150 - 450 x10E3/uL Final         Passed - HGB in normal range and within 360 days    Hemoglobin  Date Value Ref Range Status  10/17/2022 14.1 11.1 - 15.9 g/dL Final         Passed - HCT in normal range and within 360 days    Hematocrit  Date Value Ref Range Status  10/17/2022 42.6 34.0 - 46.6 % Final         Passed - Cr in normal range and within 360 days    Creat  Date Value Ref Range Status  03/07/2017 0.82 0.60 - 0.88 mg/dL Final    Comment:    For patients >20 years of age, the reference limit for Creatinine is approximately 13% higher for people identified as African-American. .    Creatinine, Ser  Date Value Ref Range Status  10/17/2022 0.85 0.57 - 1.00 mg/dL Final         Passed - AST in normal range and within 360 days    AST  Date Value Ref Range Status  10/17/2022 15 0 - 40 IU/L Final         Passed - ALT in normal range and within 360 days    ALT  Date Value Ref Range Status  10/17/2022 11 0 - 32 IU/L Final         Passed - Valid encounter within last 12 months    Recent Outpatient Visits           2 months ago Encounter for annual wellness visit (AWV) in Medicare patient   [place] [facility] Simmons-Robinson, [place], MD   5 months ago Rash   [place] [facility] [place], [place], PA-C   6 months ago Primary hypertension    [facility] [place], [place], MD   7 months ago Primary hypertension    [facility] [place], [place], MD   8 months ago Neuropathic pain   [facility] Jacky Kindle, [place]

## 2023-01-17 DIAGNOSIS — H2513 Age-related nuclear cataract, bilateral: Secondary | ICD-10-CM | POA: Diagnosis not present

## 2023-02-25 ENCOUNTER — Other Ambulatory Visit: Payer: Self-pay | Admitting: Family Medicine

## 2023-02-27 NOTE — Telephone Encounter (Signed)
Patient will need an office visit for further refills. 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

## 2023-05-04 ENCOUNTER — Ambulatory Visit: Payer: Self-pay | Admitting: *Deleted

## 2023-05-04 NOTE — Telephone Encounter (Signed)
Agree with scheduled appt

## 2023-05-04 NOTE — Telephone Encounter (Signed)
  Chief Complaint: Leg swelling Symptoms: Swelling of feet, ankles, calves. Moderate. No redness or warmth, "Achy" Frequency: 2-3 days Pertinent Negatives: Patient denies SOB, redness, warmth Disposition: [] ED /[] Urgent Care (no appt availability in office) / [x] Appointment(In office/virtual)/ []  [place] [facility]/ [] Home Care/ [] Refused Recommended Disposition /[]  Mobile Bus/ []  Follow-up with PCP Additional Notes:  Pt states "They swell like this a lot but usually go down, this time they are not." Secured first available for tomorrow am. Pt asking to be seen after 330 today if possible.Assured pt NT would route to practice for review, advised would hear back if able to be seen today, if not, plan on AM appt. Verbalizes understanding.  Care advise provided, pt verbalizes understanding.  Reason for Disposition  [1] MODERATE leg swelling (e.g., swelling extends up to knees) AND [2] new-onset or worsening  Answer Assessment - Initial Assessment Questions 1. ONSET: "When did the swelling start?" (e.g., minutes, hours, days)     2 days 2. LOCATION: "What part of the leg is swollen?"  "Are both legs swollen or just one leg?"     Feet, ankles, calves 3. SEVERITY: "How bad is the swelling?" (e.g., localized; mild, moderate, severe)   - Localized: Small area of swelling localized to one leg.   - MILD pedal edema: Swelling limited to foot and ankle, pitting edema < 1/4 inch (6 mm) deep, rest and elevation eliminate most or all swelling.   - MODERATE edema: Swelling of lower leg to knee, pitting edema > 1/4 inch (6 mm) deep, rest and elevation only partially reduce swelling.   - SEVERE edema: Swelling extends above knee, facial or hand swelling present.      Moderate 4. REDNESS: "Does the swelling look red or infected?"     No 5. PAIN: "Is the swelling painful to touch?" If Yes, ask: "How painful is it?"   (Scale 1-10; mild, moderate or severe)     Achy 6. FEVER: "Do you have a  fever?" If Yes, ask: "What is it, how was it measured, and when did it start?"      No 7. CAUSE: "What do you think is causing the leg swelling?"     Unsure 8. MEDICAL HISTORY: "Do you have a history of blood clots (e.g., DVT), cancer, heart failure, kidney disease, or liver failure?"     CVA 9. RECURRENT SYMPTOM: "Have you had leg swelling before?" If Yes, ask: "When was the last time?" "What happened that time?"     Yes, Always have, just not going away this time. 10. OTHER SYMPTOMS: "Do you have any other symptoms?" (e.g., chest pain, difficulty breathing)       Right hand cool,numb for months, not new  Protocols used: Leg Swelling and Edema-A-AH

## 2023-05-05 ENCOUNTER — Ambulatory Visit (INDEPENDENT_AMBULATORY_CARE_PROVIDER_SITE_OTHER): Payer: PPO | Admitting: Family Medicine

## 2023-05-05 ENCOUNTER — Encounter: Payer: Self-pay | Admitting: Family Medicine

## 2023-05-05 VITALS — BP 135/69 | HR 70 | Temp 97.7°F | Ht 63.0 in | Wt 148.0 lb

## 2023-05-05 DIAGNOSIS — R29898 Other symptoms and signs involving the musculoskeletal system: Secondary | ICD-10-CM

## 2023-05-05 DIAGNOSIS — R29818 Other symptoms and signs involving the nervous system: Secondary | ICD-10-CM | POA: Insufficient documentation

## 2023-05-05 DIAGNOSIS — I10 Essential (primary) hypertension: Secondary | ICD-10-CM

## 2023-05-05 DIAGNOSIS — R7303 Prediabetes: Secondary | ICD-10-CM | POA: Diagnosis not present

## 2023-05-05 DIAGNOSIS — R609 Edema, unspecified: Secondary | ICD-10-CM | POA: Insufficient documentation

## 2023-05-05 LAB — POCT URINALYSIS DIPSTICK
Bilirubin, UA: NEGATIVE
Blood, UA: NEGATIVE
Glucose, UA: NEGATIVE
Ketones, UA: NEGATIVE
Leukocytes, UA: NEGATIVE
Nitrite, UA: NEGATIVE
Protein, UA: NEGATIVE
Spec Grav, UA: 1.015 (ref 1.010–1.025)
Urobilinogen, UA: NEGATIVE U/dL — AB
pH, UA: 7.5 (ref 5.0–8.0)

## 2023-05-05 NOTE — Assessment & Plan Note (Signed)
Acute x 2 days Reassurance provided No concern for infection, normal temperature, no numbness Continue adequate hydration, low sodium diet and labs

## 2023-05-05 NOTE — Assessment & Plan Note (Signed)
Chronic, stable Continues on norvasc 2.5 mg +5 mg for a total of 7.5 mg daily; consider titration down if edema does not improved with conservative management Continues on toprol 25 mg daily

## 2023-05-05 NOTE — Assessment & Plan Note (Signed)
Chronic, previously stable Repeat labs Continue to recommend balanced, lower carb meals. Smaller meal size, adding snacks. Choosing water as drink of choice and increasing purposeful exercise.

## 2023-05-05 NOTE — Assessment & Plan Note (Signed)
Family request for UA given change in handwriting; which happened the last time the pt had a UTI UA performed; negative

## 2023-05-05 NOTE — Progress Notes (Signed)
Established patient visit   Patient: Deanna Williams   DOB: Oct 08, 1928   89 y.o. Female  MRN: [id number] Visit Date: 05/05/2023  Today's healthcare provider: Jacky Kindle, FNP  Introduced to nurse practitioner role and practice setting.  All questions answered.  Discussed provider/patient relationship and expectations.  Chief Complaint  Patient presents with   foot and ankle    Feet/ankle--swollen-2 days. Pt denied pain   Subjective    HPI HPI     foot and ankle    Additional comments: Feet/ankle--swollen-2 days. Pt denied pain      Last edited by Shelly Bombard, CMA on 05/05/2023 10:16 AM.      In addition to acute foot/ankle swelling; pt's family reports concern for UTI given change in patient's handwriting. Urine sample to be collected.  Medications: Outpatient Medications Prior to Visit  Medication Sig   amitriptyline (ELAVIL) 25 MG tablet Take 1 tablet (25 mg total) by mouth daily.   amLODipine (NORVASC) 2.5 MG tablet TAKE 1 TABLET BY MOUTH EVERY DAY   amLODipine (NORVASC) 5 MG tablet Take 5 mg by mouth daily. In addition to 2.5 mg for a total of 7.5 mg   Ascorbic Acid 60 MG LOZG Use as directed 1 lozenge (60 mg total) in the mouth or throat daily.   Cholecalciferol (VITAMIN D3) 25 MCG (1000 UT) CAPS Take 1,000 Units by mouth daily.   cyanocobalamin (VITAMIN B12) 1000 MCG/ML injection Inject 1,000 mcg into the muscle every 30 (thirty) days.   cyanocobalamin 1000 MCG tablet Take 1,000 mcg by mouth daily.   ELIQUIS 5 MG TABS tablet TAKE 1 TABLET BY MOUTH TWICE A DAY   gabapentin (NEURONTIN) 100 MG capsule Take 1 capsule (100 mg total) by mouth at bedtime.   metoprolol succinate (TOPROL-XL) 25 MG 24 hr tablet Take 1 tablet (25 mg total) by mouth daily. Take with or immediately following a meal.   No facility-administered medications prior to visit.   Last CBC Lab Results  Component Value Date   WBC 6.3 10/17/2022   HGB 14.1 10/17/2022   HCT 42.6 10/17/2022    MCV 93 10/17/2022   MCH 30.9 10/17/2022   RDW 12.6 10/17/2022   PLT 229 10/17/2022   Last metabolic panel Lab Results  Component Value Date   GLUCOSE 107 (H) 10/17/2022   NA 135 10/17/2022   K 4.8 10/17/2022   CL 100 10/17/2022   CO2 22 10/17/2022   BUN 14 10/17/2022   CREATININE 0.85 10/17/2022   EGFR 64 10/17/2022   CALCIUM 9.7 10/17/2022   PHOS 2.6 09/02/2020   PROT 6.2 10/17/2022   ALBUMIN 4.2 10/17/2022   LABGLOB 2.0 10/17/2022   AGRATIO 2.1 10/17/2022   BILITOT 0.6 10/17/2022   ALKPHOS 81 10/17/2022   AST 15 10/17/2022   ALT 11 10/17/2022   ANIONGAP 5 03/03/2022   Last lipids Lab Results  Component Value Date   CHOL 219 (H) 07/06/2021   HDL 82 07/06/2021   LDLCALC 123 (H) 07/06/2021   TRIG 82 07/06/2021   CHOLHDL 2.7 07/06/2021   Last hemoglobin A1c Lab Results  Component Value Date   HGBA1C 6.2 (H) 10/17/2022   Last thyroid functions Lab Results  Component Value Date   TSH 3.570 10/17/2022   Last vitamin D No results found for: "25OHVITD2", "25OHVITD3", "VD25OH" Last vitamin B12 and Folate Lab Results  Component Value Date   VITAMINB12 512 10/22/2020     Objective    BP  135/69 (BP Location: Right Arm, Patient Position: Sitting, Cuff Size: Normal)   Pulse 70   Temp 97.7 F (36.5 C)   Ht 5\' 3"  (1.6 m)   Wt 148 lb (67.1 kg)   BMI 26.22 kg/m   BP Readings from Last 3 Encounters:  05/05/23 135/69  10/17/22 134/72  07/27/22 126/71   Wt Readings from Last 3 Encounters:  05/05/23 148 lb (67.1 kg)  10/17/22 148 lb 11.2 oz (67.4 kg)  07/27/22 151 lb 4.8 oz (68.6 kg)   SpO2 Readings from Last 3 Encounters:  10/17/22 96%  07/07/22 95%  05/20/22 97%      Physical Exam Vitals and nursing note reviewed.  Constitutional:      General: She is not in acute distress.    Appearance: Normal appearance. She is overweight. She is not ill-appearing, toxic-appearing or diaphoretic.  HENT:     Head: Normocephalic and atraumatic.  Cardiovascular:      Rate and Rhythm: Normal rate and regular rhythm.     Pulses: Normal pulses.     Heart sounds: Normal heart sounds. No murmur heard.    No friction rub. No gallop.  Pulmonary:     Effort: Pulmonary effort is normal. No respiratory distress.     Breath sounds: Normal breath sounds. No stridor. No wheezing, rhonchi or rales.  Chest:     Chest wall: No tenderness.  Musculoskeletal:        General: No swelling, tenderness, deformity or signs of injury. Normal range of motion.     Right lower leg: 3+ Pitting Edema present.     Left lower leg: 3+ Pitting Edema present.  Skin:    General: Skin is warm and dry.     Capillary Refill: Capillary refill takes less than 2 seconds.     Coloration: Skin is not jaundiced or pale.     Findings: No bruising, erythema, lesion or rash.  Neurological:     General: No focal deficit present.     Mental Status: She is alert and oriented to person, place, and time. Mental status is at baseline.     Cranial Nerves: No cranial nerve deficit.     Sensory: No sensory deficit.     Motor: No weakness.     Coordination: Coordination normal.  Psychiatric:        Mood and Affect: Mood normal.        Behavior: Behavior normal.        Thought Content: Thought content normal.        Judgment: Judgment normal.     No results found for any visits on 05/05/23.  Assessment & Plan     Problem List Items Addressed This Visit       Cardiovascular and Mediastinum   BP (high blood pressure)   Chronic, stable Continues on norvasc 2.5 mg +5 mg for a total of 7.5 mg daily; consider titration down if edema does not improved with conservative management Continues on toprol 25 mg daily        Other   Dependent edema - Primary   Acute x 2 days Reassurance provided No concern for infection, normal temperature, no numbness Continue adequate hydration, low sodium diet and labs      Relevant Orders   POCT urinalysis dipstick   Comprehensive Metabolic Panel  (CMET)   CBC with Differential/Platelet   Hemoglobin A1c   Magnesium   Fine motor skill loss   Family request for UA given change in handwriting;  which happened the last time the pt had a UTI UA performed; negative       Prediabetes   Chronic, previously stable Repeat labs Continue to recommend balanced, lower carb meals. Smaller meal size, adding snacks. Choosing water as drink of choice and increasing purposeful exercise.       Relevant Orders   POCT urinalysis dipstick   Comprehensive Metabolic Panel (CMET)   CBC with Differential/Platelet   Hemoglobin A1c   Magnesium   Return if symptoms worsen or fail to improve.     Leilani Merl, FNP, have reviewed all documentation for this visit. The documentation on 05/05/23 for the exam, diagnosis, procedures, and orders are all accurate and complete.  Jacky Kindle, FNP  [facility] [phone] (phone) [phone] (fax)  [facility]

## 2023-05-06 LAB — COMPREHENSIVE METABOLIC PANEL
ALT: 8 [IU]/L (ref 0–32)
AST: 11 [IU]/L (ref 0–40)
Albumin: 4.3 g/dL (ref 3.6–4.6)
Alkaline Phosphatase: 76 [IU]/L (ref 44–121)
BUN/Creatinine Ratio: 8 — ABNORMAL LOW (ref 12–28)
BUN: 9 mg/dL — ABNORMAL LOW (ref 10–36)
Bilirubin Total: 0.5 mg/dL (ref 0.0–1.2)
CO2: 22 mmol/L (ref 20–29)
Calcium: 9.6 mg/dL (ref 8.7–10.3)
Chloride: 102 mmol/L (ref 96–106)
Creatinine, Ser: 1.06 mg/dL — ABNORMAL HIGH (ref 0.57–1.00)
Globulin, Total: 2.1 g/dL (ref 1.5–4.5)
Glucose: 122 mg/dL — ABNORMAL HIGH (ref 70–99)
Potassium: 4.6 mmol/L (ref 3.5–5.2)
Sodium: 139 mmol/L (ref 134–144)
Total Protein: 6.4 g/dL (ref 6.0–8.5)
eGFR: 49 mL/min/{1.73_m2} — ABNORMAL LOW (ref >59)

## 2023-05-06 LAB — HEMOGLOBIN A1C
Est. average glucose Bld gHb Est-mCnc: 131 mg/dL
Hgb A1c MFr Bld: 6.2 % — ABNORMAL HIGH (ref 4.8–5.6)

## 2023-05-06 LAB — CBC WITH DIFFERENTIAL/PLATELET
Basophils Absolute: 0 10*3/uL (ref 0.0–0.2)
Basos: 0 %
EOS (ABSOLUTE): 0.1 10*3/uL (ref 0.0–0.4)
Eos: 1 %
Hematocrit: 42.9 % (ref 34.0–46.6)
Hemoglobin: 13.8 g/dL (ref 11.1–15.9)
Immature Grans (Abs): 0 10*3/uL (ref 0.0–0.1)
Immature Granulocytes: 0 %
Lymphocytes Absolute: 1.7 10*3/uL (ref 0.7–3.1)
Lymphs: 27 %
MCH: 31.2 pg (ref 26.6–33.0)
MCHC: 32.2 g/dL (ref 31.5–35.7)
MCV: 97 fL (ref 79–97)
Monocytes Absolute: 0.4 10*3/uL (ref 0.1–0.9)
Monocytes: 7 %
Neutrophils Absolute: 4 10*3/uL (ref 1.4–7.0)
Neutrophils: 65 %
Platelets: 249 10*3/uL (ref 150–450)
RBC: 4.42 x10E6/uL (ref 3.77–5.28)
RDW: 12.2 % (ref 11.7–15.4)
WBC: 6.2 10*3/uL (ref 3.4–10.8)

## 2023-05-06 LAB — MAGNESIUM: Magnesium: 2.2 mg/dL (ref 1.6–2.3)

## 2023-05-08 ENCOUNTER — Telehealth: Payer: Self-pay

## 2023-05-08 NOTE — Telephone Encounter (Signed)
Pt given lab results per notes of Robynn Pane on 05/08/23. Pt verbalized understanding. Patient states she is still having swelling in the feet the right more than the left.    All labs are normal and stable. Continue to monitor hydration levels- ensure 4-8oz glasses of water daily.  Written by Jacky Kindle, FNP on 05/08/2023  9:36 AM EST

## 2023-05-25 ENCOUNTER — Other Ambulatory Visit: Payer: Self-pay | Admitting: Internal Medicine

## 2023-05-25 ENCOUNTER — Other Ambulatory Visit: Payer: Self-pay | Admitting: Family Medicine

## 2023-05-25 DIAGNOSIS — I10 Essential (primary) hypertension: Secondary | ICD-10-CM

## 2023-05-25 DIAGNOSIS — R42 Dizziness and giddiness: Secondary | ICD-10-CM

## 2023-05-30 ENCOUNTER — Other Ambulatory Visit: Payer: Self-pay | Admitting: Internal Medicine

## 2023-05-30 NOTE — Telephone Encounter (Signed)
 Please contact pt for future appointment. Pt due for follow up.

## 2023-06-01 ENCOUNTER — Other Ambulatory Visit: Payer: Self-pay

## 2023-06-01 MED ORDER — METOPROLOL SUCCINATE ER 25 MG PO TB24: 25.0000 mg | ORAL_TABLET | Freq: Every day | ORAL | 0 refills | Status: DC

## 2023-06-12 ENCOUNTER — Ambulatory Visit: Payer: Self-pay

## 2023-06-12 NOTE — Telephone Encounter (Signed)
    Chief Complaint: Bilateral leg swelling Symptoms: Above Frequency: 2 weeks Pertinent Negatives: Patient denies SOB Disposition: [] ED /[] Urgent Care (no appt availability in office) / [x] Appointment(In office/virtual)/ []  [facility]/ [] Home Care/ [] Refused Recommended Disposition /[] [facility]/ []  Follow-up with PCP Additional Notes: Agrees with appointment.  Reason for Disposition  [1] MODERATE leg swelling (e.g., swelling extends up to knees) AND [2] new-onset or worsening  Answer Assessment - Initial Assessment Questions 1. ONSET: "When did the swelling start?" (e.g., minutes, hours, days)     2 weeks 2. LOCATION: "What part of the leg is swollen?"  "Are both legs swollen or just one leg?"     Both, right leg worse 3. SEVERITY: "How bad is the swelling?" (e.g., localized; mild, moderate, severe)   - Localized: Small area of swelling localized to one leg.   - MILD pedal edema: Swelling limited to foot and ankle, pitting edema < 1/4 inch (6 mm) deep, rest and elevation eliminate most or all swelling.   - MODERATE edema: Swelling of lower leg to knee, pitting edema > 1/4 inch (6 mm) deep, rest and elevation only partially reduce swelling.   - SEVERE edema: Swelling extends above knee, facial or hand swelling present.      Knee 4. REDNESS: "Does the swelling look red or infected?"     No 5. PAIN: "Is the swelling painful to touch?" If Yes, ask: "How painful is it?"   (Scale 1-10; mild, moderate or severe)     6 6. FEVER: "Do you have a fever?" If Yes, ask: "What is it, how was it measured, and when did it start?"      No 7. CAUSE: "What do you think is causing the leg swelling?"     Unsure 8. MEDICAL HISTORY: "Do you have a history of blood clots (e.g., DVT), cancer, heart failure, kidney disease, or liver failure?"     No 9. RECURRENT SYMPTOM: "Have you had leg swelling before?" If Yes, ask: "When was the last time?" "What happened that time?"     No 10.  OTHER SYMPTOMS: "Do you have any other symptoms?" (e.g., chest pain, difficulty breathing)       No 11. PREGNANCY: "Is there any chance you are pregnant?" "When was your last menstrual period?"       No  Protocols used: Leg Swelling and Edema-A-AH

## 2023-06-14 ENCOUNTER — Encounter: Payer: Self-pay | Admitting: Physician Assistant

## 2023-06-14 ENCOUNTER — Ambulatory Visit (INDEPENDENT_AMBULATORY_CARE_PROVIDER_SITE_OTHER): Payer: PPO | Admitting: Physician Assistant

## 2023-06-14 VITALS — BP 133/74 | HR 62 | Ht 63.0 in | Wt 148.1 lb

## 2023-06-14 DIAGNOSIS — I10 Essential (primary) hypertension: Secondary | ICD-10-CM | POA: Diagnosis not present

## 2023-06-14 DIAGNOSIS — R2231 Localized swelling, mass and lump, right upper limb: Secondary | ICD-10-CM | POA: Diagnosis not present

## 2023-06-14 DIAGNOSIS — R200 Anesthesia of skin: Secondary | ICD-10-CM | POA: Diagnosis not present

## 2023-06-14 DIAGNOSIS — R112 Nausea with vomiting, unspecified: Secondary | ICD-10-CM | POA: Diagnosis not present

## 2023-06-14 DIAGNOSIS — R609 Edema, unspecified: Secondary | ICD-10-CM

## 2023-06-14 MED ORDER — HYDROCHLOROTHIAZIDE 12.5 MG PO TABS: 12.5000 mg | ORAL_TABLET | Freq: Every day | ORAL | 0 refills | Status: DC

## 2023-06-14 NOTE — Progress Notes (Signed)
Established patient visit  Patient: Deanna Williams   DOB: 25-Jun-1928   89 y.o. Female  MRN: [id number] Visit Date: 06/14/2023  Today's healthcare provider: Debera Lat, PA-C   Chief Complaint  Patient presents with   Leg Swelling    Bilateral leg edema X 3 months minimum. Reports sometimes not as swollen but never 100 % normal. Only has one pair of shoes she can wear.    Emesis    Reports taking place the last 2 night after eating dinner but not after any other meal.   Numbness    Right hand X at minimum 3 months   Subjective     Discussed the use of AI scribe software for clinical note transcription with the patient, who gave verbal consent to proceed.  History of Present Illness   The patient, with a history of stroke and hypertension, presents with bilateral leg swelling that has been ongoing for three months. The swelling fluctuates in size, but never completely resolves. The patient describes the sensation as if there is more fluid than his skin can hold.  In addition to the leg swelling, the patient has been experiencing episodes of vomiting, specifically at dinnertime, for the past two nights. The vomiting occurs shortly after eating, but the patient denies eating anything unusual. The patient also reports occasional chest pain, shortness of breath, and rapid heart beating, but these symptoms are not present at the time of the visit.  The patient also mentions a lack of energy and feeling tired. SHe also reports numbness in his hand, which she attributes to a previous stroke. The patient lives alone and is usually driven to appointments by a friend or her granddaughter.           06/14/2023    1:56 PM 10/17/2022    8:29 AM 06/16/2022    8:18 AM  Depression screen PHQ 2/9  Decreased Interest 2 2 0  Down, Depressed, Hopeless 0 0 0  PHQ - 2 Score 2 2 0  Altered sleeping 0 2 1  Tired, decreased energy 2 2 2   Change in appetite 0 0 0  Feeling bad or failure about yourself   0 0 0  Trouble concentrating 0 0 0  Moving slowly or fidgety/restless 0 0 0  Suicidal thoughts 0 0 0  PHQ-9 Score 4 6 3   Difficult doing work/chores Not difficult at all Not difficult at all Not difficult at all      06/14/2023    1:56 PM  GAD 7 : Generalized Anxiety Score  Nervous, Anxious, on Edge 0  Control/stop worrying 0  Worry too much - different things 0  Trouble relaxing 0  Restless 0  Easily annoyed or irritable 0  Afraid - awful might happen 0  Total GAD 7 Score 0  Anxiety Difficulty Not difficult at all    Medications: Outpatient Medications Prior to Visit  Medication Sig   amitriptyline (ELAVIL) 25 MG tablet Take 1 tablet (25 mg total) by mouth daily.   amLODipine (NORVASC) 2.5 MG tablet TAKE 1 TABLET BY MOUTH EVERY DAY   amLODipine (NORVASC) 5 MG tablet Take 5 mg by mouth daily. In addition to 2.5 mg for a total of 7.5 mg   Ascorbic Acid 60 MG LOZG Use as directed 1 lozenge (60 mg total) in the mouth or throat daily.   Cholecalciferol (VITAMIN D3) 25 MCG (1000 UT) CAPS Take 1,000 Units by mouth daily.   cyanocobalamin (VITAMIN B12) 1000 MCG/ML injection Inject  1,000 mcg into the muscle every 30 (thirty) days.   cyanocobalamin 1000 MCG tablet Take 1,000 mcg by mouth daily.   ELIQUIS 5 MG TABS tablet TAKE 1 TABLET BY MOUTH TWICE A DAY   gabapentin (NEURONTIN) 100 MG capsule Take 1 capsule (100 mg total) by mouth at bedtime.   metoprolol succinate (TOPROL-XL) 25 MG 24 hr tablet Take 1 tablet (25 mg total) by mouth daily. TAKE WITH OR IMMEDIATELY FOLLOWING A MEAL.   No facility-administered medications prior to visit.    Review of Systems All negative Except see HPI       Objective    BP 133/74 (BP Location: Left Arm, Patient Position: Sitting, Cuff Size: Normal)   Pulse 62   Ht 5\' 3"  (1.6 m)   Wt 148 lb 1.6 oz (67.2 kg)   SpO2 97%   BMI 26.23 kg/m     Physical Exam Vitals reviewed.  Constitutional:      General: She is not in acute distress.     Appearance: Normal appearance. She is well-developed. She is not diaphoretic.  HENT:     Head: Normocephalic and atraumatic.  Eyes:     General: No scleral icterus.    Conjunctiva/sclera: Conjunctivae normal.  Neck:     Thyroid: No thyromegaly.  Cardiovascular:     Rate and Rhythm: Normal rate and regular rhythm.     Pulses: Normal pulses.     Heart sounds: Normal heart sounds. No murmur heard. Pulmonary:     Effort: Pulmonary effort is normal. No respiratory distress.     Breath sounds: Normal breath sounds. No wheezing, rhonchi or rales.  Musculoskeletal:     Cervical back: Neck supple.     Right lower leg: No edema.     Left lower leg: No edema.  Lymphadenopathy:     Cervical: No cervical adenopathy.  Skin:    General: Skin is warm and dry.     Findings: No rash.  Neurological:     Mental Status: She is alert and oriented to person, place, and time. Mental status is at baseline.  Psychiatric:        Mood and Affect: Mood normal.        Behavior: Behavior normal.      No results found for any visits on 06/14/23.      Assessment and Plan    Bilateral Lower Extremity Edema Chronic, worsening over the past three months. Possible side effect of amlodipine. -Decrease amlodipine to 2.5mg  daily. -Start Hydrochlorothiazide (HCTZ) 12.5mg  daily. -Encourage elevation of legs and use of compression stockings. -Check blood pressure at home and record readings.  Acute Nausea and Vomiting New onset, occurring at dinner time for the past two nights. No clear dietary trigger identified. Could be due to viral gastroenteritis -Advise bland diet and increased water intake. -Return if symptoms persist or worsen.  Lump in hand Hand Numbness Chronic, x 64mo, no pain or stiffness.  -Consider referral to hand surgeon for further evaluation. -Consider imaging or ultrasound if symptoms worsen but pt preferred to wait.  Hypertension chronic Blood pressure slightly elevated at visit.  Currently on amlodipine. -Transition to hydrochlorothiazide 12.5mg , continue amlodipine 2.5. -Check blood pressure at home and record readings. -Follow up in two weeks to reassess blood pressure control.  General Health Maintenance / Followup Plans -Order labs today to check kidney function and electrolytes. -Follow up in two weeks with blood pressure readings and to reassess medication changes.     Orders Placed This Encounter  Procedures   Comprehensive metabolic panel    Has the patient fasted?:   Yes   CBC with Differential/Platelet   TSH   Lipid panel    Has the patient fasted?:   Yes    Return in about 2 weeks (around 06/28/2023).   The patient was advised to call back or seek an in-person evaluation if the symptoms worsen or if the condition fails to improve as anticipated.  I discussed the assessment and treatment plan with the patient. The patient was provided an opportunity to ask questions and all were answered. The patient agreed with the plan and demonstrated an understanding of the instructions.  I, Debera Lat, PA-C have reviewed all documentation for this visit. The documentation on 06/14/2023  for the exam, diagnosis, procedures, and orders are all accurate and complete.  Debera Lat, [facility], MMS [facility] [phone] (phone) [phone] (fax)  [facility]

## 2023-06-21 ENCOUNTER — Other Ambulatory Visit: Payer: Self-pay | Admitting: Family Medicine

## 2023-06-25 NOTE — Progress Notes (Addendum)
Established patient visit  Patient: Deanna Williams   DOB: 1928/11/13   89 y.o. Female  MRN: [id number] Visit Date: 06/27/2023  Today's healthcare provider: Debera Lat, PA-C   Chief Complaint  Patient presents with   Hypertension    Medication has not helped with leg swelling   Subjective     Discussed the use of AI scribe software for clinical note transcription with the patient, who gave verbal consent to proceed.  History of Present Illness   The patient, with a history of hypertension, presents with persistent leg swelling. Despite being on hydrochlorothiazide, the swelling has not improved and has been present for approximately eight months. The swelling is severe enough to cause discomfort and difficulty in wearing compression socks. The patient also reports back pain due to the constant need to elevate her legs.  In addition to the leg swelling, the patient is on amlodipine for hypertension. However, there seems to be confusion about the dosage. The patient has been taking 12.5mg  daily, split between morning and evening, which is more than the prescribed 7.5mg  daily. The patient's caregiver confirms this regimen and notes that the patient was previously on a higher dose of 10mg  daily, which was reduced due to dizziness.  The patient denies any chest pain or rapid heart beating but does report occasional shortness of breath, especially when walking a lot. The patient's caregiver notes that this has not changed significantly over the past year.           06/14/2023    1:56 PM 10/17/2022    8:29 AM 06/16/2022    8:18 AM  Depression screen PHQ 2/9  Decreased Interest 2 2 0  Down, Depressed, Hopeless 0 0 0  PHQ - 2 Score 2 2 0  Altered sleeping 0 2 1  Tired, decreased energy 2 2 2   Change in appetite 0 0 0  Feeling bad or failure about yourself  0 0 0  Trouble concentrating 0 0 0  Moving slowly or fidgety/restless 0 0 0  Suicidal thoughts 0 0 0  PHQ-9 Score 4 6 3    Difficult doing work/chores Not difficult at all Not difficult at all Not difficult at all      06/14/2023    1:56 PM  GAD 7 : Generalized Anxiety Score  Nervous, Anxious, on Edge 0  Control/stop worrying 0  Worry too much - different things 0  Trouble relaxing 0  Restless 0  Easily annoyed or irritable 0  Afraid - awful might happen 0  Total GAD 7 Score 0  Anxiety Difficulty Not difficult at all    Medications: Outpatient Medications Prior to Visit  Medication Sig   amLODipine (NORVASC) 2.5 MG tablet TAKE 1 TABLET BY MOUTH EVERY DAY   amLODipine (NORVASC) 5 MG tablet Take 5 mg by mouth daily. In addition to 2.5 mg for a total of 7.5 mg   Ascorbic Acid 60 MG LOZG Use as directed 1 lozenge (60 mg total) in the mouth or throat daily.   Cholecalciferol (VITAMIN D3) 25 MCG (1000 UT) CAPS Take 1,000 Units by mouth daily.   cyanocobalamin (VITAMIN B12) 1000 MCG/ML injection Inject 1,000 mcg into the muscle every 30 (thirty) days.   cyanocobalamin 1000 MCG tablet Take 1,000 mcg by mouth daily.   ELIQUIS 5 MG TABS tablet TAKE 1 TABLET BY MOUTH TWICE A DAY   gabapentin (NEURONTIN) 100 MG capsule Take 1 capsule (100 mg total) by mouth at bedtime.   hydrochlorothiazide (HYDRODIURIL) 12.5  MG tablet Take 1 tablet (12.5 mg total) by mouth daily.   metoprolol succinate (TOPROL-XL) 25 MG 24 hr tablet Take 1 tablet (25 mg total) by mouth daily. TAKE WITH OR IMMEDIATELY FOLLOWING A MEAL.   amitriptyline (ELAVIL) 25 MG tablet Take 1 tablet (25 mg total) by mouth daily. (Patient not taking: Reported on 06/27/2023)   No facility-administered medications prior to visit.    Review of Systems All negative Except see HPI       Objective    BP (!) 141/74   Pulse 66   Resp 18   Ht 5\' 3"  (1.6 m)   Wt 145 lb 4.8 oz (65.9 kg)   SpO2 92%   BMI 25.74 kg/m     Physical Exam Vitals reviewed.  Constitutional:      General: She is not in acute distress.    Appearance: Normal appearance. She is  well-developed. She is not diaphoretic.  HENT:     Head: Normocephalic and atraumatic.  Eyes:     General: No scleral icterus.    Conjunctiva/sclera: Conjunctivae normal.  Neck:     Thyroid: No thyromegaly.  Cardiovascular:     Rate and Rhythm: Normal rate and regular rhythm.     Pulses: Normal pulses.     Heart sounds: Normal heart sounds. No murmur heard. Pulmonary:     Effort: Pulmonary effort is normal. No respiratory distress.     Breath sounds: Normal breath sounds. No wheezing, rhonchi or rales.  Musculoskeletal:     Cervical back: Neck supple.     Right lower leg: No edema.     Left lower leg: No edema.  Lymphadenopathy:     Cervical: No cervical adenopathy.  Skin:    General: Skin is warm and dry.     Findings: No rash.  Neurological:     Mental Status: She is alert and oriented to person, place, and time. Mental status is at baseline.  Psychiatric:        Mood and Affect: Mood normal.        Behavior: Behavior normal.      No results found for any visits on 06/27/23.      Assessment and Plan    Lower Extremity Edema Chronic, worsening over the past 8 months. Likely secondary to high dose Amlodipine. No use of compression stockings due to difficulty in application. -Decrease Amlodipine from 12.5mg  to 10mg  daily for one week, then to 7.5mg  daily. -Consider use of mild grade compression stockings. -Continue Hydrochlorothiazide 12.5mg  daily.  Hypertension Controlled on Amlodipine and Hydrochlorothiazide. -Adjust Amlodipine as above. -Monitor blood pressure during Amlodipine dose reduction. Encouraged a strict low salt diet  General Health Maintenance -Order labs including lipid panel, TSH, CBC, comprehensive metabolic panel, and cardiac markers. -Follow-up appointment in 2 weeks to assess response to medication changes and review lab results.      Orders Placed This Encounter  Procedures   Pro b natriuretic peptide (BNP)[id number])     Return in about 2 weeks (around 07/11/2023) for BP f/u.   The patient was advised to call back or seek an in-person evaluation if the symptoms worsen or if the condition fails to improve as anticipated.  I discussed the assessment and treatment plan with the patient. The patient was provided an opportunity to ask questions and all were answered. The patient agreed with the plan and demonstrated an understanding of the instructions.  I, Debera Lat, PA-C have reviewed all documentation for this visit.  The documentation on 06/27/2023  for the exam, diagnosis, procedures, and orders are all accurate and complete.  Debera Lat, [facility], MMS [facility] [phone] (phone) [phone] (fax)  [facility]

## 2023-06-27 ENCOUNTER — Ambulatory Visit: Payer: Self-pay | Admitting: Physician Assistant

## 2023-06-27 ENCOUNTER — Other Ambulatory Visit: Payer: Self-pay | Admitting: Internal Medicine

## 2023-06-27 VITALS — BP 141/74 | HR 66 | Resp 18 | Ht 63.0 in | Wt 145.3 lb

## 2023-06-27 DIAGNOSIS — R200 Anesthesia of skin: Secondary | ICD-10-CM

## 2023-06-27 DIAGNOSIS — I10 Essential (primary) hypertension: Secondary | ICD-10-CM | POA: Diagnosis not present

## 2023-06-27 DIAGNOSIS — R609 Edema, unspecified: Secondary | ICD-10-CM | POA: Diagnosis not present

## 2023-06-27 DIAGNOSIS — R2231 Localized swelling, mass and lump, right upper limb: Secondary | ICD-10-CM

## 2023-06-28 ENCOUNTER — Encounter: Payer: Self-pay | Admitting: Physician Assistant

## 2023-06-28 LAB — LIPID PANEL
Chol/HDL Ratio: 2.5 {ratio} (ref 0.0–4.4)
Cholesterol, Total: 221 mg/dL — ABNORMAL HIGH (ref 100–199)
HDL: 88 mg/dL (ref >39)
LDL Chol Calc (NIH): 116 mg/dL — ABNORMAL HIGH (ref 0–99)
Triglycerides: 98 mg/dL (ref 0–149)
VLDL Cholesterol Cal: 17 mg/dL (ref 5–40)

## 2023-06-28 LAB — CBC WITH DIFFERENTIAL/PLATELET
Basophils Absolute: 0 10*3/uL (ref 0.0–0.2)
Basos: 0 %
EOS (ABSOLUTE): 0.1 10*3/uL (ref 0.0–0.4)
Eos: 1 %
Hematocrit: 45.4 % (ref 34.0–46.6)
Hemoglobin: 15.3 g/dL (ref 11.1–15.9)
Immature Grans (Abs): 0 10*3/uL (ref 0.0–0.1)
Immature Granulocytes: 0 %
Lymphocytes Absolute: 1.6 10*3/uL (ref 0.7–3.1)
Lymphs: 26 %
MCH: 32.3 pg (ref 26.6–33.0)
MCHC: 33.7 g/dL (ref 31.5–35.7)
MCV: 96 fL (ref 79–97)
Monocytes Absolute: 0.4 10*3/uL (ref 0.1–0.9)
Monocytes: 7 %
Neutrophils Absolute: 4 10*3/uL (ref 1.4–7.0)
Neutrophils: 66 %
Platelets: 250 10*3/uL (ref 150–450)
RBC: 4.74 x10E6/uL (ref 3.77–5.28)
RDW: 12.4 % (ref 11.7–15.4)
WBC: 6.1 10*3/uL (ref 3.4–10.8)

## 2023-06-28 LAB — COMPREHENSIVE METABOLIC PANEL
ALT: 10 [IU]/L (ref 0–32)
AST: 19 [IU]/L (ref 0–40)
Albumin: 4.6 g/dL (ref 3.6–4.6)
Alkaline Phosphatase: 82 [IU]/L (ref 44–121)
BUN/Creatinine Ratio: 13 (ref 12–28)
BUN: 12 mg/dL (ref 10–36)
Bilirubin Total: 0.7 mg/dL (ref 0.0–1.2)
CO2: 23 mmol/L (ref 20–29)
Calcium: 10.3 mg/dL (ref 8.7–10.3)
Chloride: 103 mmol/L (ref 96–106)
Creatinine, Ser: 0.96 mg/dL (ref 0.57–1.00)
Globulin, Total: 2 g/dL (ref 1.5–4.5)
Glucose: 111 mg/dL — ABNORMAL HIGH (ref 70–99)
Potassium: 4.3 mmol/L (ref 3.5–5.2)
Sodium: 141 mmol/L (ref 134–144)
Total Protein: 6.6 g/dL (ref 6.0–8.5)
eGFR: 56 mL/min/{1.73_m2} — ABNORMAL LOW (ref >59)

## 2023-06-28 LAB — TSH: TSH: 3.63 u[IU]/mL (ref 0.450–4.500)

## 2023-06-28 LAB — PRO B NATRIURETIC PEPTIDE: NT-Pro BNP: 368 pg/mL (ref 0–738)

## 2023-07-01 ENCOUNTER — Other Ambulatory Visit: Payer: Self-pay | Admitting: Internal Medicine

## 2023-07-12 ENCOUNTER — Ambulatory Visit: Payer: PPO | Attending: Internal Medicine | Admitting: Internal Medicine

## 2023-07-12 ENCOUNTER — Encounter: Payer: Self-pay | Admitting: Family Medicine

## 2023-07-12 ENCOUNTER — Ambulatory Visit (INDEPENDENT_AMBULATORY_CARE_PROVIDER_SITE_OTHER): Payer: PPO | Admitting: Family Medicine

## 2023-07-12 ENCOUNTER — Encounter: Payer: Self-pay | Admitting: Internal Medicine

## 2023-07-12 VITALS — BP 131/62 | HR 80 | Ht 63.0 in | Wt 149.0 lb

## 2023-07-12 VITALS — BP 130/70 | HR 58 | Ht 63.0 in | Wt 150.0 lb

## 2023-07-12 DIAGNOSIS — R600 Localized edema: Secondary | ICD-10-CM

## 2023-07-12 DIAGNOSIS — R002 Palpitations: Secondary | ICD-10-CM

## 2023-07-12 DIAGNOSIS — Z8673 Personal history of transient ischemic attack (TIA), and cerebral infarction without residual deficits: Secondary | ICD-10-CM

## 2023-07-12 DIAGNOSIS — M810 Age-related osteoporosis without current pathological fracture: Secondary | ICD-10-CM

## 2023-07-12 DIAGNOSIS — I4891 Unspecified atrial fibrillation: Secondary | ICD-10-CM | POA: Diagnosis not present

## 2023-07-12 DIAGNOSIS — I480 Paroxysmal atrial fibrillation: Secondary | ICD-10-CM | POA: Diagnosis not present

## 2023-07-12 DIAGNOSIS — I10 Essential (primary) hypertension: Secondary | ICD-10-CM

## 2023-07-12 DIAGNOSIS — R229 Localized swelling, mass and lump, unspecified: Secondary | ICD-10-CM

## 2023-07-12 DIAGNOSIS — R0609 Other forms of dyspnea: Secondary | ICD-10-CM

## 2023-07-12 DIAGNOSIS — R609 Edema, unspecified: Secondary | ICD-10-CM

## 2023-07-12 DIAGNOSIS — I5031 Acute diastolic (congestive) heart failure: Secondary | ICD-10-CM

## 2023-07-12 DIAGNOSIS — M67441 Ganglion, right hand: Secondary | ICD-10-CM | POA: Insufficient documentation

## 2023-07-12 MED ORDER — FUROSEMIDE 40 MG PO TABS
40.0000 mg | ORAL_TABLET | Freq: Every day | ORAL | 3 refills | Status: DC
Start: 2023-07-12 — End: 2023-08-02

## 2023-07-12 NOTE — Patient Instructions (Signed)
 Medication Instructions:  Your physician recommends the following medication changes.  STOP TAKING: Hydrochlorothiazide  START TAKING: Lasix 40 mg by mouth daily  DECREASE: Amlodipine 5 mg by mouth daily  *If you need a refill on your cardiac medications before your next appointment, please call your pharmacy*   Lab Work: No labs ordered today    Testing/Procedures: No test ordered today    Follow-Up: At [facility], you and your health needs are our priority.  As part of our continuing mission to provide you with exceptional heart care, we have created designated Provider Care Teams.  These Care Teams include your primary Cardiologist (physician) and Advanced Practice Providers (APPs -  Physician Assistants and Nurse Practitioners) who all work together to provide you with the care you need, when you need it.  We recommend signing up for the patient portal called "MyChart".  Sign up information is provided on this After Visit Summary.  MyChart is used to connect with patients for Virtual Visits (Telemedicine).  Patients are able to view lab/test results, encounter notes, upcoming appointments, etc.  Non-urgent messages can be sent to your provider as well.   To learn more about what you can do with MyChart, go to [email or web address].    Your next appointment:   2 week(s)  Provider:   You may see Yvonne Kendall, MD or one of the following Advanced Practice Providers on your designated Care Team:   Nicolasa Ducking, NP Eula Listen, PA-C Cadence Fransico Michael, PA-C Charlsie Quest, NP Carlos Levering, NP

## 2023-07-12 NOTE — Patient Instructions (Signed)
 VISIT SUMMARY:  During your visit, we discussed your ongoing issues with swelling in both ankles, your well-controlled hypertension, and the painful nodules on your left palm and nose. We believe the ankle swelling may be due to your amlodipine medication, and we will adjust the dosage to help alleviate this. Your hypertension is currently well-managed with your current medications, and we will continue to monitor it. The nodules on your palm and nose will require further evaluation by specialists.  YOUR PLAN:  -ANKLE SWELLING: The swelling in your ankles is likely due to your amlodipine medication. We will reduce the dosage to 5 mg daily for one week, and if the swelling persists, we will further reduce it to 2.5 mg daily. We will also monitor your blood pressure to ensure it remains under 150/90 mmHg. If necessary, we may consult with a cardiologist for further management.  -HYPERTENSION: Your blood pressure is well-controlled with your current medications. We will continue with your current regimen of hydrochlorothiazide 12.5 mg daily and metoprolol 25 mg daily, and monitor your blood pressure regularly.  -NASAL NODULE: The nodule on your nose, which has been present for about a year, will require further evaluation. We will refer you to a dermatologist for further evaluation.  -PALMAR CYST: The cyst on your left palm, which has been causing discomfort and increasing in size, will require further evaluation. We will refer you to a hand surgeon for further evaluation.  INSTRUCTIONS:  Please monitor your blood pressure regularly and report any significant changes. Follow the new dosage instructions for your amlodipine medication. Please make appointments with a dermatologist and a hand surgeon for further evaluation of your nasal nodule and palmar cyst, respectively.

## 2023-07-12 NOTE — Progress Notes (Unsigned)
 Cardiology Office Note:  .   Date:  07/13/2023  ID:  Deanna Williams, DOB [birth year], MRN [id number] PCP: Ronnald Ramp, MD   HeartCare Providers Cardiologist:  Yvonne Kendall, MD Electrophysiologist:  Will Jorja Loa, MD     History of Present Illness: Deanna Williams is a 89 y.o. female with history of cryptogenic stroke with subsequent discovery of paroxysmal atrial fibrillation, hypertension, and hyperlipidemia, who presents for follow-up of atrial fibrillation and hypertension.  I last saw her a year ago, at which time she was feeling fairly well.  We did not make any medication changes or pursue additional testing.  Today, Deanna Williams is concerned about increased leg swelling.  At times, her legs are so tight that they hurt.  She has been elevating them, when possible, though she notes that the swelling never completely resolves.  Chronic exertional dyspnea remains mild and unchanged from prior visits.  She denies chest pain, palpitations, and lightheadedness.  She notes that her amlodipine was recently reduced by her PCP due to concerns that it was contributing to her leg swelling.  She also may have inadvertently been taking up to 12.5 mg daily of amlodipine.  Home BP's are typically around 130/60.  ROS: See HPI  Studies Reviewed: Marland Kitchen   EKG Interpretation Date/Time:  Wednesday July 12 2023 16:33:37 EST Ventricular Rate:  58 PR Interval:  148 QRS Duration:  66 QT Interval:  440 QTC Calculation: 431 R Axis:   -5  Text Interpretation: Sinus bradycardia Possible Septal infarct (cited on or before 03-Mar-2022) When compared with ECG of 07-Jul-2022 No significant change was found Confirmed by Kitana Gage [phone]) on 07/13/2023 8:32:51 AM    TTE (05/31/2022): Normal LV size with mild LVH.  LVEF 60-65% with normal wall motion and grade 1 diastolic dysfunction.  Normal RV size and function with mild pulmonary hypertension (RVSP 37 mmHg).  Moderate left  atrial enlargement.  Mild MR.  Moderate TR.  Aortic sclerosis without stenosis.  CVP ~8 mmHg.  Risk Assessment/Calculations:    CHA2DS2-VASc Score = 6   This indicates a 9.7% annual risk of stroke. The patient's score is based upon: CHF History: 0 HTN History: 1 Diabetes History: 0 Stroke History: 2 Vascular Disease History: 0 Age Score: 2 Gender Score: 1            Physical Exam:   VS:  BP 130/70 (BP Location: Left Arm, Patient Position: Sitting, Cuff Size: Normal)   Pulse (!) 58   Ht 5\' 3"  (1.6 m)   Wt 150 lb (68 kg)   SpO2 96%   BMI 26.57 kg/m    Wt Readings from Last 3 Encounters:  07/12/23 150 lb (68 kg)  07/12/23 149 lb (67.6 kg)  06/27/23 145 lb 4.8 oz (65.9 kg)    General:  NAD. Neck: JVP ~6 cm with positive HJR. Lungs: Clear to auscultation bilaterally without wheezes or crackles. Heart: Bradycardic but regular with 2/6 systolic murmur. Abdomen: Soft, nontender, nondistended. Extremities: 2+ pretibial edema to the proximal calves.  ASSESSMENT AND PLAN: .    Acute HFpEF and leg edema: Deanna Williams reports progressive leg swelling over the last few months.  While high-dose amlodipine is likely playing a role, mild JVD is also noted on exam today suggesting that there could be a cardiogenic component.  Echo ~1 year ago showed normal LVEF with grade 1 diastolic dysfunction, mild PH, and moderate TR.  I have recommended reducing amlodipine to 5 mg daily,  stopping hydrochlorothiazide, and starting furosemide 40 mg daily.  We will have Deanna Williams return to see an APP in 2 weeks; BMP should be drawn at that time to ensure stable renal function and electrolytes.  If edema worsens in spite of these interventions or chronic DOE worsens, repeat echo will need to be obtained.  Paroxysmal atrial fibrillation: EKG shows sinus bradycardia today.  No palpitations or new neurologic deficits reported.  Continue apixaban 5 mg twice daily and metoprolol succinate 25 mg  daily.  Hypertension: BP borderline today.  As above, we will reduce amlodipine, hold hydrochlorothiazide, and add furosemide with plans for BMP in 2 weeks ago follow-up.  If BP trends up, I would favor adding an ACEI/ARB rather than going back up on amlodipine.    Dispo: Return to clinic in 2 weeks with APP.  Signed, Yvonne Kendall, MD

## 2023-07-12 NOTE — Assessment & Plan Note (Signed)
 chronic Bilateral ankle swelling for three months, likely due to amlodipine. The swelling is persistent, non-pitting, and painful, causing decreased mobility. Amlodipine was previously dosed incorrectly, contributing to the edema. Hydrochlorothiazide is ineffective in reducing swelling. Reducing amlodipine may alleviate the edema while maintaining blood pressure control. - Reduce amlodipine to 5 mg daily for one week - If swelling persists, reduce amlodipine to 2.5 mg daily - Monitor blood pressure to ensure it remains under 150/90 mmHg - Consult with cardiologist for further management

## 2023-07-12 NOTE — Assessment & Plan Note (Signed)
 Blood pressure is well-controlled at 131/62 mmHg with amlodipine, hydrochlorothiazide, and metoprolol. Current regimen is effective, and the goal is to maintain blood pressure under 150/90 mmHg. Adjustments to amlodipine are being made to address peripheral edema while ensuring blood pressure remains controlled. Chronic - Continue hydrochlorothiazide 12.5 mg daily - Continue metoprolol 25 mg daily - Monitor blood pressure regularly

## 2023-07-12 NOTE — Progress Notes (Signed)
 Established patient visit   Patient: Deanna Williams   DOB: Nov 05, 1928   89 y.o. Female  MRN: [id number] Visit Date: 07/12/2023  Today's healthcare provider: Ronnald Ramp, MD   Chief Complaint  Patient presents with   Hypertension    Bp recheck, she has home log with her    Joint Swelling    Swelling in both ankles, this has been going on for about 3 months    Subjective     HPI     Hypertension    Additional comments: Bp recheck, she has home log with her         Joint Swelling    Additional comments: Swelling in both ankles, this has been going on for about 3 months       Last edited by Thedora Hinders, CMA on 07/12/2023 11:14 AM.       Discussed the use of AI scribe software for clinical note transcription with the patient, who gave verbal consent to proceed.  History of Present Illness   The patient, with hypertension, presents with swelling in both ankles for three months.  She has been experiencing swelling in both ankles for the past three months. The swelling does not improve overnight and is not associated with pitting edema. It is painful and has impacted her mobility, reducing her ability to walk and stay active as she used to. She has been keeping her feet elevated to manage the swelling.  Her hypertension is currently well-controlled with a blood pressure of 131/62 mmHg. She is on amlodipine 7.5 mg daily, metoprolol 25 mg daily, and hydrochlorothiazide 12.5 mg daily. There was a previous issue with her amlodipine dosage where she was taking 10 mg instead of the prescribed 7.5 mg due to a pharmacy error. This has since been corrected, but the swelling persists.  She also has a painful nodule on her left palm, which has been present for about a year and has recently become more uncomfortable. The nodule is described as cyst-like and is increasing in size. Additionally, she has a nodule on her nose that has been present for about a year,  which previously ruptured and bled but has since healed.  No headaches or blurry vision.         Past Medical History:  Diagnosis Date   Atrial fibrillation (HCC)    Gastroesophageal reflux disease    Hyperlipidemia    Hypertension    Lichen sclerosus    Lichen sclerosus et atrophicus    Osteopenia    Palpitations    Stroke [facility])     Medications: Outpatient Medications Prior to Visit  Medication Sig   amLODipine (NORVASC) 2.5 MG tablet TAKE 1 TABLET BY MOUTH EVERY DAY   amLODipine (NORVASC) 5 MG tablet Take 5 mg by mouth daily. In addition to 2.5 mg for a total of 7.5 mg   Cholecalciferol (VITAMIN D3) 25 MCG (1000 UT) CAPS Take 1,000 Units by mouth daily.   cyanocobalamin (VITAMIN B12) 1000 MCG/ML injection Inject 1,000 mcg into the muscle every 30 (thirty) days.   cyanocobalamin 1000 MCG tablet Take 1,000 mcg by mouth daily.   ELIQUIS 5 MG TABS tablet TAKE 1 TABLET BY MOUTH TWICE A DAY   hydrochlorothiazide (HYDRODIURIL) 12.5 MG tablet Take 1 tablet (12.5 mg total) by mouth daily.   metoprolol succinate (TOPROL-XL) 25 MG 24 hr tablet TAKE 1 TABLET BY MOUTH DAILY. TAKE WITH OR IMMEDIATELY FOLLOWING A MEAL.   amitriptyline (ELAVIL) 25  MG tablet Take 1 tablet (25 mg total) by mouth daily. (Patient not taking: Reported on 07/12/2023)   Ascorbic Acid 60 MG LOZG Use as directed 1 lozenge (60 mg total) in the mouth or throat daily. (Patient not taking: Reported on 07/12/2023)   gabapentin (NEURONTIN) 100 MG capsule Take 1 capsule (100 mg total) by mouth at bedtime. (Patient not taking: Reported on 07/12/2023)   No facility-administered medications prior to visit.    Review of Systems  Last CBC Lab Results  Component Value Date   WBC 6.1 06/27/2023   HGB 15.3 06/27/2023   HCT 45.4 06/27/2023   MCV 96 06/27/2023   MCH 32.3 06/27/2023   RDW 12.4 06/27/2023   PLT 250 06/27/2023   Last metabolic panel Lab Results  Component Value Date   GLUCOSE 111 (H) 06/27/2023   NA 141  06/27/2023   K 4.3 06/27/2023   CL 103 06/27/2023   CO2 23 06/27/2023   BUN 12 06/27/2023   CREATININE 0.96 06/27/2023   EGFR 56 (L) 06/27/2023   CALCIUM 10.3 06/27/2023   PHOS 2.6 09/02/2020   PROT 6.6 06/27/2023   ALBUMIN 4.6 06/27/2023   LABGLOB 2.0 06/27/2023   AGRATIO 2.1 10/17/2022   BILITOT 0.7 06/27/2023   ALKPHOS 82 06/27/2023   AST 19 06/27/2023   ALT 10 06/27/2023   ANIONGAP 5 03/03/2022   Last lipids Lab Results  Component Value Date   CHOL 221 (H) 06/27/2023   HDL 88 06/27/2023   LDLCALC 116 (H) 06/27/2023   TRIG 98 06/27/2023   CHOLHDL 2.5 06/27/2023   Last hemoglobin A1c Lab Results  Component Value Date   HGBA1C 6.2 (H) 05/05/2023   Last thyroid functions Lab Results  Component Value Date   TSH 3.630 06/27/2023        Objective    BP 131/62   Pulse 80   Ht 5\' 3"  (1.6 m)   Wt 149 lb (67.6 kg)   SpO2 98%   BMI 26.39 kg/m  BP Readings from Last 3 Encounters:  07/12/23 131/62  06/27/23 (!) 141/74  06/14/23 133/74   Wt Readings from Last 3 Encounters:  07/12/23 149 lb (67.6 kg)  06/27/23 145 lb 4.8 oz (65.9 kg)  06/14/23 148 lb 1.6 oz (67.2 kg)       Physical Exam         No results found for any visits on 07/12/23.  Assessment & Plan     Problem List Items Addressed This Visit       Cardiovascular and Mediastinum   BP (high blood pressure) - Primary   Blood pressure is well-controlled at 131/62 mmHg with amlodipine, hydrochlorothiazide, and metoprolol. Current regimen is effective, and the goal is to maintain blood pressure under 150/90 mmHg. Adjustments to amlodipine are being made to address peripheral edema while ensuring blood pressure remains controlled. Chronic - Continue hydrochlorothiazide 12.5 mg daily - Continue metoprolol 25 mg daily - Monitor blood pressure regularly      Atrial fibrillation (HCC)     Musculoskeletal and Integument   Osteoporosis   Ganglion cyst of flexor tendon sheath of finger of right  hand   Relevant Orders   Ambulatory referral to Hand Surgery     Other   History of ischemic left PCA stroke   Dependent edema   chronic Bilateral ankle swelling for three months, likely due to amlodipine. The swelling is persistent, non-pitting, and painful, causing decreased mobility. Amlodipine was previously dosed incorrectly, contributing to the  edema. Hydrochlorothiazide is ineffective in reducing swelling. Reducing amlodipine may alleviate the edema while maintaining blood pressure control. - Reduce amlodipine to 5 mg daily for one week - If swelling persists, reduce amlodipine to 2.5 mg daily - Monitor blood pressure to ensure it remains under 150/90 mmHg - Consult with cardiologist for further management      Other Visit Diagnoses       Skin nodule       Relevant Orders   Ambulatory referral to Dermatology       Nasal Nodule Nasal nodule present for approximately one year, previously ruptured and bled. The nodule is firm and requires further evaluation to rule out dermatological conditions or other issues. - Refer to dermatology for evaluation of nasal nodule  Palmar Cyst Cyst on the left palm causing discomfort and increasing in size. The cyst is painful but allows for finger movement. Further evaluation is necessary to determine the nature of the cyst. - Refer to hand surgery for evaluation of palmar cyst      Return in about 3 weeks (around 08/02/2023) for BP and leg swelling .      Ronnald Ramp, MD  [facility] [phone] (phone) [phone] (fax)  [facility]

## 2023-07-13 ENCOUNTER — Encounter: Payer: Self-pay | Admitting: Internal Medicine

## 2023-07-14 DIAGNOSIS — M67441 Ganglion, right hand: Secondary | ICD-10-CM | POA: Insufficient documentation

## 2023-08-01 NOTE — Progress Notes (Unsigned)
 Cardiology Clinic Note   Date: 08/03/2023 ID: Deanna Williams, Deanna Williams 03-Sep-1928, MRN [id number]  [facility] Providers Cardiologist:  Yvonne Kendall, MD Electrophysiologist:  Will Jorja Loa, MD {  Chief Complaint   Deanna Williams is a 89 y.o. female who presents to the clinic today for follow up after medication changes.   Patient Profile   Deanna Williams is followed by Dr. Okey Dupre and Dr. Elberta Fortis for the history outlined below.      Past medical history significant for: DOE. Echo 05/31/2022: EF 60 to 65%.  No RWMA.  Mild LVH.  Grade I DD.  Normal RV size/function.  Mildly elevated PA pressure, RVSP 36.6 mmHg.  Moderate LAE.  Mild MR.  Moderate TR.  Aortic valve sclerosis without stenosis.  Dilated IVC, RA pressure 8 mmHg. PAF. Onset April 2022 in the setting of acute stroke. 14-day ZIO 06/13/2022: HR 57 to 138 bpm, average 83 bpm.  Occasional PACs (2%).  Rare PVCs.  3 episodes of NSVT lasting up to 9 beats with max rate 210 bpm.  PAF (<1%) with longest episode lasting 3 minutes 46 seconds, average rate 124 bpm (range 97 to 154 bpm).  Patient triggered events corresponded to sinus rhythm, PACs, PVCs. Hypertension. Ischemic stroke.  Cerebral angiography 09/01/2020: S/p endovascular complete revascularization of the left posterior cerebral artery P1 segment.  S/p rescue stenting of reocclusion with restoration of TICI 3 recannulization of the left PCA territory  In summary, patient presented to the ED on 09/01/2020 for right-sided weakness/numbness that started the night prior.  Numbness only on right side of face.  CTA positive for acute PCA occlusion-patient underwent endovascular revascularization and stent placement as detailed above.  Echo at that time demonstrated EF 60 to 65%, no RWMA, Grade I DD, normal RV size/function, mildly elevated PA pressure.  Lower extremity vascular ultrasound was negative for DVT bilaterally.  On 4/22 patient had a brief period of A-fib with rates  in the 100s to 130s noted on telemetry.  She was discharged on 09/05/2020 with Eliquis for PAF and Brilinta per neurology for stroke. She had a one follow up visit with Dr. Elberta Fortis in May 2022.  Patient was first evaluated by Dr. Okey Dupre on 05/20/2022 to establish care closer to home. She reported numbness and tingling of right leg and sensation of swelling/tightness of bilateral legs without actual swelling. She reports rare palpitations about once a month lasting 5 minutes without associated symptoms. She also reports occasional lightheadedness without syncope. She reported increased DOE. BP was elevated and amlodipine was increased. Repeat echo showed normal LV function as detailed above. 14 day Zio showed <1% afib burden as detailed above.  Upon follow up in February 2024 BP was improved. Discussed ischemic evaluation for DOE but patient agreed to defer.   Patient was last seen in the office by Dr. Okey Dupre on 07/12/2023 for routine follow up. She complained of increased lower extremity edema. She had been managing edema with elevation but edema never completely resolves. No change in chronic DOE reported. Amlodipine was decreased by PCP, however, patient may have been taking 12.5 mg inadvertently. Patient was instructed to decrease amlodipine to 5 mg, stop hydrochlorothiazide and start Lasix 40 mg daily.      History of Present Illness    Today, patient is accompanied by her grandson. She reports lower extremity edema is unchanged since starting Lasix. She denies increased urination with Lasix. She does elevate her legs throughout the day. She is unable to  wear compression socks regularly because she cannot get them on herself. She does notice improvement in edema when she wears them. She reports dyspnea with prolonged walking but not with routine activities. No orthopnea or PND reported. She was seen by her PCP yesterday and changed from Lasix to torsemide and instructed to continue Amlodipine 7.5 mg. It is  unclear if she started that today. Per Dr. Serita Kyle last note amlodipine was reduced to 5 mg daily. Patient manges her medications on her own. No chest pian or palpitations.     ROS: All other systems reviewed and are otherwise negative except as noted in History of Present Illness.  EKGs/Labs Reviewed        06/27/2023: ALT 10; AST 19; BUN 12; Creatinine, Ser 0.96; Potassium 4.3; Sodium 141   06/27/2023: Hemoglobin 15.3; WBC 6.1   06/27/2023: TSH 3.630   06/27/2023: NT-Pro BNP 368    Physical Exam    VS:  BP 138/70 (BP Location: Left Arm, Patient Position: Sitting, Cuff Size: Normal)   Pulse 61   Ht 5\' 3"  (1.6 m)   Wt 147 lb 3.2 oz (66.8 kg)   SpO2 96%   BMI 26.08 kg/m  , BMI Body mass index is 26.08 kg/m.  GEN: Well nourished, well developed, in no acute distress. Neck: No JVD or carotid bruits. Cardiac:  RRR. 2/6 systolic murmur. No rubs or gallops.   Respiratory:  Respirations regular and unlabored. Clear to auscultation without rales, wheezing or rhonchi. GI: Soft, nontender, nondistended. Extremities: Radials/DP/PT 2+ and equal bilaterally. No clubbing or cyanosis. 1+ pitting edema bilateral lower extremities.   Skin: Warm and dry, no rash. Neuro: Strength intact.  Assessment & Plan   Lower extremity edema/Chronic DOE Echo January 2024 demonstrated normal LV/RV function, no RWMA, mild LVH, grade I DD, mildly elevated PA pressure, moderate LAE, mild MR, moderate TR. Patient reported increased lower extremity edema. She may have been taking an increased dose of amlodipine inadvertently. She was instructed to decrease amlodipine, stop hydrochlorothiazide and start Lasix at last visit. Today she reports continued lower extremity edema unchanged since last visit. Frequency of urination has not changed since starting Lasix. She does not notice much improvement with elevation. She feels compression makes edema somewhat better but she cannot wear compression socks consistently, as  she cannot get them on by herself. It appears she has continued amlodipine 7.5 mg. She was seen by PCP yesterday and Lasix was stopped and torsemide started. No orthopnea or PND reported. She does get "winded" when walking longer distances but denies dyspnea during more routine activities. 1+ pitting edema bilateral lower extremities on exam today otherwise euvolemic and well compensated on exam. It is unclear if she started torsemide today or took Lasix. She manages her own medications at home. Discussed with patient's grandson that if she did not start torsemide today but instead took Lasix it is okay to start tomorrow. Stressed importance of decreasing amlodipine to 5 mg, as the increased dose can contribute to edema. Will schedule her for a 2 months visit since she is going to see her PCP in 1 month.  -Instructed patient to contact the office if she develops worsening dyspnea, orthopnea or PND.  -Start torsemide as prescribed by PCP one day ago.  -BMP today.  PAF Onset April 2022. 14 day Zio January 2024 demonstrated <1% afib burden. Denies spontaneous bleeding concerns. Patient denies palpitations. RRR on exam today.  -Continue Toprol and Eliquis. Appropriate Eliquis dose.    Hypertension BP  today 153/81 on intake and 138/70 on my recheck. Will hold off on any other medication adjustments today.  -Continue amlodipine at 5 mg daily, Toprol.   Disposition: BMP today. Return in 2 months or sooner as needed.          Signed, Etta Grandchild. Shavanna Furnari, DNP, NP-C

## 2023-08-02 ENCOUNTER — Encounter: Payer: Self-pay | Admitting: Family Medicine

## 2023-08-02 ENCOUNTER — Ambulatory Visit (INDEPENDENT_AMBULATORY_CARE_PROVIDER_SITE_OTHER): Payer: PPO | Admitting: Family Medicine

## 2023-08-02 VITALS — BP 137/64 | HR 55 | Ht 63.0 in | Wt 146.0 lb

## 2023-08-02 DIAGNOSIS — I10 Essential (primary) hypertension: Secondary | ICD-10-CM | POA: Diagnosis not present

## 2023-08-02 DIAGNOSIS — R609 Edema, unspecified: Secondary | ICD-10-CM | POA: Diagnosis not present

## 2023-08-02 MED ORDER — TORSEMIDE 20 MG PO TABS: 20.0000 mg | ORAL_TABLET | Freq: Every day | ORAL | 0 refills | Status: DC

## 2023-08-02 NOTE — Progress Notes (Unsigned)
 Established patient visit   Patient: Deanna Williams   DOB: 07/17/1928   89 y.o. Female  MRN: [id number] Visit Date: 08/02/2023  Today's healthcare provider: Ronnald Ramp, MD   Chief Complaint  Patient presents with   Leg Swelling    Swelling in both legs for a couple months its tight and can get painful    Subjective     HPI     Leg Swelling    Additional comments: Swelling in both legs for a couple months its tight and can get painful       Last edited by Thedora Hinders, CMA on 08/02/2023  9:53 AM.       Discussed the use of AI scribe software for clinical note transcription with the patient, who gave verbal consent to proceed.  History of Present Illness   Deanna Williams is a 89 year old female who presents with edema.  She has been experiencing edema for several months, describing it as tight and almost painful. Despite taking Lasix 40 mg and amlodipine, there has been no significant improvement in her symptoms. Neither of the two diuretics prescribed have increased her urination frequency, and her feet remain swollen.  Her current medications include amlodipine 7.5 mg, metoprolol 25 mg once daily, and Lasix 40 mg. She recalls that her amlodipine dose was previously higher due to a pharmacy error, but it has since been adjusted to the correct dose of 7.5 mg. She wants the swelling to subside, stating, 'I just want little feet.'  No wheezing or crackles in her lungs.         Past Medical History:  Diagnosis Date   Atrial fibrillation (HCC)    Gastroesophageal reflux disease    Hyperlipidemia    Hypertension    Lichen sclerosus    Lichen sclerosus et atrophicus    Osteopenia    Palpitations    Stroke [facility])     Medications: Outpatient Medications Prior to Visit  Medication Sig   amitriptyline (ELAVIL) 25 MG tablet Take 1 tablet (25 mg total) by mouth daily.   amLODipine (NORVASC) 5 MG tablet Take 5 mg by mouth daily. In addition to  2.5 mg for a total of 7.5 mg   Ascorbic Acid 60 MG LOZG Use as directed 1 lozenge (60 mg total) in the mouth or throat daily.   Cholecalciferol (VITAMIN D3) 25 MCG (1000 UT) CAPS Take 1,000 Units by mouth daily.   cyanocobalamin (VITAMIN B12) 1000 MCG/ML injection Inject 1,000 mcg into the muscle every 30 (thirty) days.   cyanocobalamin 1000 MCG tablet Take 1,000 mcg by mouth daily.   ELIQUIS 5 MG TABS tablet TAKE 1 TABLET BY MOUTH TWICE A DAY   metoprolol succinate (TOPROL-XL) 25 MG 24 hr tablet TAKE 1 TABLET BY MOUTH DAILY. TAKE WITH OR IMMEDIATELY FOLLOWING A MEAL.   [DISCONTINUED] furosemide (LASIX) 40 MG tablet Take 1 tablet (40 mg total) by mouth daily.   gabapentin (NEURONTIN) 100 MG capsule Take 1 capsule (100 mg total) by mouth at bedtime. (Patient not taking: Reported on 08/02/2023)   No facility-administered medications prior to visit.    Review of Systems  Last metabolic panel Lab Results  Component Value Date   GLUCOSE 111 (H) 06/27/2023   NA 141 06/27/2023   K 4.3 06/27/2023   CL 103 06/27/2023   CO2 23 06/27/2023   BUN 12 06/27/2023   CREATININE 0.96 06/27/2023   EGFR 56 (L) 06/27/2023   CALCIUM  10.3 06/27/2023   PHOS 2.6 09/02/2020   PROT 6.6 06/27/2023   ALBUMIN 4.6 06/27/2023   LABGLOB 2.0 06/27/2023   AGRATIO 2.1 10/17/2022   BILITOT 0.7 06/27/2023   ALKPHOS 82 06/27/2023   AST 19 06/27/2023   ALT 10 06/27/2023   ANIONGAP 5 03/03/2022   Last lipids Lab Results  Component Value Date   CHOL 221 (H) 06/27/2023   HDL 88 06/27/2023   LDLCALC 116 (H) 06/27/2023   TRIG 98 06/27/2023   CHOLHDL 2.5 06/27/2023   Last hemoglobin A1c Lab Results  Component Value Date   HGBA1C 6.2 (H) 05/05/2023   Last thyroid functions Lab Results  Component Value Date   TSH 3.630 06/27/2023        Objective    BP 137/64   Pulse (!) 55   Ht 5\' 3"  (1.6 m)   Wt 146 lb (66.2 kg)   SpO2 96%   BMI 25.86 kg/m  BP Readings from Last 3 Encounters:  08/02/23 137/64   07/12/23 130/70  07/12/23 131/62   Wt Readings from Last 3 Encounters:  08/02/23 146 lb (66.2 kg)  07/12/23 150 lb (68 kg)  07/12/23 149 lb (67.6 kg)        Physical Exam Vitals reviewed.  Constitutional:      General: She is not in acute distress.    Appearance: Normal appearance. She is not ill-appearing, toxic-appearing or diaphoretic.  Eyes:     Conjunctiva/sclera: Conjunctivae normal.  Cardiovascular:     Rate and Rhythm: Normal rate and regular rhythm.     Pulses: Normal pulses.     Heart sounds: Normal heart sounds. No murmur heard.    No friction rub. No gallop.  Pulmonary:     Effort: Pulmonary effort is normal. No respiratory distress.     Breath sounds: Normal breath sounds. No stridor. No wheezing, rhonchi or rales.  Abdominal:     General: Bowel sounds are normal. There is no distension.     Palpations: Abdomen is soft.     Tenderness: There is no abdominal tenderness.  Musculoskeletal:     Right lower leg: Edema present.     Left lower leg: Edema present.  Skin:    Findings: No erythema or rash.  Neurological:     Mental Status: She is alert and oriented to person, place, and time.  Psychiatric:        Mood and Affect: Mood and affect normal.        Speech: Speech normal.        Behavior: Behavior normal. Behavior is cooperative.       No results found for any visits on 08/02/23.  Assessment & Plan     Problem List Items Addressed This Visit       Cardiovascular and Mediastinum   Hypertension goal BP (blood pressure) < 140/90   Blood pressure is well-controlled at 137/64 mmHg on current regimen of amlodipine 7.5 mg and metoprolol 25 mg. Previous higher doses of amlodipine were reduced to 7.5 mg, which has maintained good blood pressure control. Consideration of further reduction of amlodipine if edema persists and blood pressure remains controlled. - Continue amlodipine 7.5 mg - Continue metoprolol 25 mg once daily - continue to follow up with  cardiology as scheduled  - Consider further reduction of amlodipine if edema persists and blood pressure remains controlled          Relevant Medications   torsemide (DEMADEX) 20 MG tablet  Other   Dependent edema - Primary   Chronic peripheral edema for several months, described as tight and almost painful. Current treatment with Lasix (furosemide) 40 mg and amlodipine 7.5 mg has not been effective in reducing edema. Reports no significant increase in urination with Lasix. Creatinine and potassium levels are within normal limits, indicating no renal impairment. The edema may be related to amlodipine use, as discussed with Dr. Melanee Spry. Considering switching to torsemide, a more potent diuretic, due to lack of response to Lasix. - Discontinue Lasix (furosemide) - Initiate torsemide 20 mg - Continue amlodipine 7.5 mg - Monitor for reduction in edema and side effects of torsemide, including effects on sodium, potassium, and renal function - Follow up in 4-6 weeks to assess response to treatment      Relevant Medications   torsemide (DEMADEX) 20 MG tablet       Return in about 1 month (around 09/02/2023) for edema .         Ronnald Ramp, MD  [facility] [phone] (phone) [phone] (fax)  [facility]

## 2023-08-03 ENCOUNTER — Encounter: Payer: Self-pay | Admitting: Student

## 2023-08-03 ENCOUNTER — Ambulatory Visit: Payer: PPO | Attending: Student | Admitting: Student

## 2023-08-03 VITALS — BP 138/70 | HR 61 | Ht 63.0 in | Wt 147.2 lb

## 2023-08-03 DIAGNOSIS — R600 Localized edema: Secondary | ICD-10-CM

## 2023-08-03 DIAGNOSIS — Z79899 Other long term (current) drug therapy: Secondary | ICD-10-CM | POA: Diagnosis not present

## 2023-08-03 DIAGNOSIS — I10 Essential (primary) hypertension: Secondary | ICD-10-CM

## 2023-08-03 DIAGNOSIS — I480 Paroxysmal atrial fibrillation: Secondary | ICD-10-CM | POA: Diagnosis not present

## 2023-08-03 DIAGNOSIS — R0609 Other forms of dyspnea: Secondary | ICD-10-CM | POA: Diagnosis not present

## 2023-08-03 NOTE — Assessment & Plan Note (Signed)
 Blood pressure is well-controlled at 137/64 mmHg on current regimen of amlodipine 7.5 mg and metoprolol 25 mg. Previous higher doses of amlodipine were reduced to 7.5 mg, which has maintained good blood pressure control. Consideration of further reduction of amlodipine if edema persists and blood pressure remains controlled. - Continue amlodipine 7.5 mg - Continue metoprolol 25 mg once daily - continue to follow up with cardiology as scheduled  - Consider further reduction of amlodipine if edema persists and blood pressure remains controlled

## 2023-08-03 NOTE — Patient Instructions (Signed)
 Medication Instructions:  No changes *If you need a refill on your cardiac medications before your next appointment, please call your pharmacy*   Lab Work: Your provider would like for you to have the following labs today: BMET  If you have labs (blood work) drawn today and your tests are completely normal, you will receive your results only by: MyChart Message (if you have MyChart) OR A paper copy in the mail If you have any lab test that is abnormal or we need to change your treatment, we will call you to review the results.   Testing/Procedures: None ordered   Follow-Up: At [facility], you and your health needs are our priority.  As part of our continuing mission to provide you with exceptional heart care, we have created designated Provider Care Teams.  These Care Teams include your primary Cardiologist (physician) and Advanced Practice Providers (APPs -  Physician Assistants and Nurse Practitioners) who all work together to provide you with the care you need, when you need it.  We recommend signing up for the patient portal called "MyChart".  Sign up information is provided on this After Visit Summary.  MyChart is used to connect with patients for Virtual Visits (Telemedicine).  Patients are able to view lab/test results, encounter notes, upcoming appointments, etc.  Non-urgent messages can be sent to your provider as well.   To learn more about what you can do with MyChart, go to [email or web address].    Your next appointment:   2 month(s)  Provider:   You may see Yvonne Kendall, MD or one of the following Advanced Practice Providers on your designated Care Team:   Carlos Levering, NP

## 2023-08-03 NOTE — Assessment & Plan Note (Signed)
 Chronic peripheral edema for several months, described as tight and almost painful. Current treatment with Lasix (furosemide) 40 mg and amlodipine 7.5 mg has not been effective in reducing edema. Reports no significant increase in urination with Lasix. Creatinine and potassium levels are within normal limits, indicating no renal impairment. The edema may be related to amlodipine use, as discussed with Dr. Melanee Spry. Considering switching to torsemide, a more potent diuretic, due to lack of response to Lasix. - Discontinue Lasix (furosemide) - Initiate torsemide 20 mg - Continue amlodipine 7.5 mg - Monitor for reduction in edema and side effects of torsemide, including effects on sodium, potassium, and renal function - Follow up in 4-6 weeks to assess response to treatment

## 2023-08-04 LAB — BASIC METABOLIC PANEL
BUN/Creatinine Ratio: 14 (ref 12–28)
BUN: 13 mg/dL (ref 10–36)
CO2: 23 mmol/L (ref 20–29)
Calcium: 9.9 mg/dL (ref 8.7–10.3)
Chloride: 105 mmol/L (ref 96–106)
Creatinine, Ser: 0.9 mg/dL (ref 0.57–1.00)
Glucose: 103 mg/dL — ABNORMAL HIGH (ref 70–99)
Potassium: 4.4 mmol/L (ref 3.5–5.2)
Sodium: 141 mmol/L (ref 134–144)
eGFR: 60 mL/min/{1.73_m2} (ref >59)

## 2023-08-19 ENCOUNTER — Other Ambulatory Visit: Payer: Self-pay | Admitting: Family Medicine

## 2023-08-21 NOTE — Telephone Encounter (Signed)
 No longer current dosing of this medication  Requested Prescriptions  Pending Prescriptions Disp Refills   amLODipine (NORVASC) 2.5 MG tablet [Pharmacy Med Name: AMLODIPINE BESYLATE 2.5 MG TAB] 90 tablet 0    Sig: TAKE 1 TABLET BY MOUTH EVERY DAY     Cardiovascular: Calcium Channel Blockers 2 Passed - 08/21/2023  3:09 PM      Passed - Last BP in normal range    BP Readings from Last 1 Encounters:  08/03/23 138/70         Passed - Last Heart Rate in normal range    Pulse Readings from Last 1 Encounters:  08/03/23 61         Passed - Valid encounter within last 6 months    Recent Outpatient Visits           2 weeks ago Dependent edema   [place] [facility] Simmons-Robinson, [place], MD   1 month ago Primary hypertension   [place] [facility] Brandon, [place], MD   1 month ago Primary hypertension   [place] [facility] [place], [place], PA-C       Future Appointments             In 1 month [place], Information systems manager, NP [facility]

## 2023-08-24 ENCOUNTER — Other Ambulatory Visit: Payer: Self-pay | Admitting: Family Medicine

## 2023-08-24 DIAGNOSIS — R609 Edema, unspecified: Secondary | ICD-10-CM

## 2023-09-09 ENCOUNTER — Emergency Department
Admission: EM | Admit: 2023-09-09 | Discharge: 2023-09-09 | Disposition: A | Discharge status: Home / Self Care | Attending: Emergency Medicine | Admitting: Emergency Medicine

## 2023-09-09 ENCOUNTER — Emergency Department

## 2023-09-09 ENCOUNTER — Encounter: Payer: Self-pay | Admitting: Radiology

## 2023-09-09 ENCOUNTER — Other Ambulatory Visit: Payer: Self-pay

## 2023-09-09 ENCOUNTER — Other Ambulatory Visit: Payer: Self-pay | Admitting: Physician Assistant

## 2023-09-09 DIAGNOSIS — I509 Heart failure, unspecified: Secondary | ICD-10-CM | POA: Diagnosis not present

## 2023-09-09 DIAGNOSIS — Z7901 Long term (current) use of anticoagulants: Secondary | ICD-10-CM | POA: Insufficient documentation

## 2023-09-09 DIAGNOSIS — R600 Localized edema: Secondary | ICD-10-CM | POA: Diagnosis present

## 2023-09-09 DIAGNOSIS — I10 Essential (primary) hypertension: Secondary | ICD-10-CM

## 2023-09-09 LAB — COMPREHENSIVE METABOLIC PANEL WITH GFR
ALT: 12 U/L (ref 0–44)
AST: 18 U/L (ref 15–41)
Albumin: 4.2 g/dL (ref 3.5–5.0)
Alkaline Phosphatase: 66 U/L (ref 38–126)
Anion gap: 10 (ref 5–15)
BUN: 16 mg/dL (ref 8–23)
CO2: 25 mmol/L (ref 22–32)
Calcium: 9.8 mg/dL (ref 8.9–10.3)
Chloride: 102 mmol/L (ref 98–111)
Creatinine, Ser: 1.07 mg/dL — ABNORMAL HIGH (ref 0.44–1.00)
GFR, Estimated: 49 mL/min — ABNORMAL LOW (ref >60)
Glucose, Bld: 110 mg/dL — ABNORMAL HIGH (ref 70–99)
Potassium: 3.9 mmol/L (ref 3.5–5.1)
Sodium: 137 mmol/L (ref 135–145)
Total Bilirubin: 0.6 mg/dL (ref 0.0–1.2)
Total Protein: 7.1 g/dL (ref 6.5–8.1)

## 2023-09-09 LAB — URINALYSIS, ROUTINE W REFLEX MICROSCOPIC
Bilirubin Urine: NEGATIVE
Glucose, UA: NEGATIVE mg/dL
Hgb urine dipstick: NEGATIVE
Ketones, ur: NEGATIVE mg/dL
Leukocytes,Ua: NEGATIVE
Nitrite: NEGATIVE
Protein, ur: NEGATIVE mg/dL
Specific Gravity, Urine: 1.006 (ref 1.005–1.030)
pH: 7 (ref 5.0–8.0)

## 2023-09-09 LAB — CBC WITH DIFFERENTIAL/PLATELET
Abs Immature Granulocytes: 0.01 10*3/uL (ref 0.00–0.07)
Basophils Absolute: 0 10*3/uL (ref 0.0–0.1)
Basophils Relative: 1 %
Eosinophils Absolute: 0.1 10*3/uL (ref 0.0–0.5)
Eosinophils Relative: 2 %
HCT: 43.4 % (ref 36.0–46.0)
Hemoglobin: 14.6 g/dL (ref 12.0–15.0)
Immature Granulocytes: 0 %
Lymphocytes Relative: 34 %
Lymphs Abs: 1.8 10*3/uL (ref 0.7–4.0)
MCH: 31.3 pg (ref 26.0–34.0)
MCHC: 33.6 g/dL (ref 30.0–36.0)
MCV: 92.9 fL (ref 80.0–100.0)
Monocytes Absolute: 0.5 10*3/uL (ref 0.1–1.0)
Monocytes Relative: 9 %
Neutro Abs: 2.8 10*3/uL (ref 1.7–7.7)
Neutrophils Relative %: 54 %
Platelets: 229 10*3/uL (ref 150–400)
RBC: 4.67 MIL/uL (ref 3.87–5.11)
RDW: 12.4 % (ref 11.5–15.5)
WBC: 5.2 10*3/uL (ref 4.0–10.5)
nRBC: 0 % (ref 0.0–0.2)

## 2023-09-09 LAB — BRAIN NATRIURETIC PEPTIDE: B Natriuretic Peptide: 297 pg/mL — ABNORMAL HIGH (ref 0.0–100.0)

## 2023-09-09 LAB — TROPONIN I (HIGH SENSITIVITY): Troponin I (High Sensitivity): 11 ng/L (ref <18)

## 2023-09-09 MED ORDER — FUROSEMIDE 10 MG/ML IJ SOLN
40.0000 mg | Freq: Once | INTRAMUSCULAR | Status: AC
  Administered 2023-09-09: 40 mg via INTRAVENOUS
  Filled 2023-09-09: qty 4

## 2023-09-09 NOTE — ED Provider Notes (Addendum)
 [facility] Provider Note    Event Date/Time   First MD Initiated Contact with Patient 09/09/23 1836     (approximate)   History   Leg Swelling   HPI  Deanna Williams is a 89 y.o. female who comes in with concern for swelling in her feet for the past 2 weeks but worsening today.  Patient was started on torsemide  yesterday.  She denies any chest pain, shortness of breath.   I reviewed a note from 07/12/23.  Patient was seen by cardiology and is on apixaban  5 mg twice daily  I reviewed a note from 3/20 where patient was seen and was placed on Lasix  without improvement of it and then this was switched to torsemide .  She was previously on amlodipine  and this was considered discontinued back in March  Patient reports that she comes back in today due to increasing swelling.  She reports taking her Eliquis  and that she does all of her own medications.  She continues on her amlodipine .  She reports taking torsemide  with good urine output but she reports that her legs are still more swollen today and more tight.  She ambulates with a cane and denies any issues with ambulation.  She denies any chest pain, shortness of breath.   Physical Exam   Triage Vital Signs: ED Triage Vitals  Encounter Vitals Group     BP 09/09/23 1710 127/83     Systolic BP Percentile --      Diastolic BP Percentile --      Pulse Rate 09/09/23 1710 80     Resp 09/09/23 1710 17     Temp 09/09/23 1710 98.1 F (36.7 C)     Temp src --      SpO2 09/09/23 1710 95 %     Weight 09/09/23 1725 147 lb (66.7 kg)     Height 09/09/23 1725 5\' 3"  (1.6 m)     Head Circumference --      Peak Flow --      Pain Score --      Pain Loc --      Pain Education --      Exclude from Growth Chart --     Most recent vital signs: Vitals:   09/09/23 1710 09/09/23 1725  BP: 127/83   Pulse: 80   Resp: 17   Temp: 98.1 F (36.7 C)   SpO2: 95% 95%     General: Awake, no distress.  CV:  Good peripheral  perfusion.  Resp:  Normal effort.  Abd:  No distention.  Other:  Edema noted bilaterally left greater than right.  No redness, no warmth.  2+ distal pulses.   ED Results / Procedures / Treatments   Labs (all labs ordered are listed, but only abnormal results are displayed) Labs Reviewed  BRAIN NATRIURETIC PEPTIDE - Abnormal; Notable for the following components:      Result Value   B Natriuretic Peptide 297.0 (*)    All other components within normal limits  COMPREHENSIVE METABOLIC PANEL WITH GFR - Abnormal; Notable for the following components:   Glucose, Bld 110 (*)    Creatinine, Ser 1.07 (*)    GFR, Estimated 49 (*)    All other components within normal limits  CBC WITH DIFFERENTIAL/PLATELET      RADIOLOGY I have reviewed the xray personally and interpreted cardiomegaly similar prior     PROCEDURES:  Critical Care performed: No  Procedures   MEDICATIONS ORDERED IN ED:  Medications  furosemide  (LASIX ) injection 40 mg (40 mg Intravenous Given 09/09/23 2009)     IMPRESSION / MDM / ASSESSMENT AND PLAN / ED COURSE  I reviewed the triage vital signs and the nursing notes.   Patient's presentation is most consistent with acute presentation with potential threat to life or bodily function.   Suspect this could be related to amlodipine  versus CHF versus liver dysfunction versus nephrotic syndrome versus venous stasis.  Patient is only on 20 mg of torsemide  once daily.  Contrary to triage note she has not been on torsemide  just since yesterday.  On review of records it appears that this has been an ongoing issue and she has been seen for this for over a month.  CMP shows elevated creatinine.  BNP is elevated.  CBC is reassuring  Us  dvt negative Xray no edema- stable cardiomegly.  I did confirm with the radiologist that this does appear stable from previous and does not appear worsening.  Reviewed the notes where patient had an echocardiogram done on 1/16 with a EF of 60  to 65% with grade 1 diastolic dysfunction.  EKG my interpretation is sinus rate of 66 without any ST elevation or T wave inversions, normal intervals  Troponin was not negative and this been an ongoing issue and she denied any chest pain or shortness of breath I do not feel the patient needs a repeat I just want to screen to make sure not significantly elevated given her ongoing issues with swelling  Offered patient admission to the hospital versus going home.  Patient felt comfortable with going home.  She is going to take her torsemide  twice daily for 3 days and follow-up with her primary care doctor and I put a referral in for the heart failure team.  I also recommended her discussing with her primary care team the amlodipine .  They may want to discontinue this to see if this helps symptoms.  FINAL CLINICAL IMPRESSION(S) / ED DIAGNOSES   Final diagnoses:  Peripheral edema  Acute congestive heart failure, unspecified heart failure type [facility])     Rx / DC Orders   ED Discharge Orders          Ordered    AMB referral to CHF clinic        09/09/23 2201             Note:  This document was prepared using Dragon voice recognition software and may include unintentional dictation errors.   Lubertha Rush, MD 09/09/23 2231    Lubertha Rush, MD 09/09/23 2249

## 2023-09-09 NOTE — Discharge Instructions (Addendum)
 Take your torsemide  once at 8 AM and once at 2 PM for the next 3 days to see if this helps with your swelling.  Do this for 3 days max.  If you do notice low blood pressures dizziness lightheadedness then please only take 1 for that day.  Call your primary care doctor or your cardiologist to make a follow-up appointment to discuss your amlodipine .  They may want to come off of this altogether.  There was no evidence of blood clots.  I placed a referral to the heart failure clinic and they should call you to make a follow-up appointment.  Return to the ER if develop shortness of breath worsening symptoms or any other concerns

## 2023-09-09 NOTE — ED Triage Notes (Signed)
 States she has had swelling in her feet for the past 2 weeks but today it is worse. She was started on Torsemide  yesterday but states her legs are bigger today than before.Denies shortness of breath or chest pain.

## 2023-09-11 NOTE — Telephone Encounter (Signed)
 Medication no longer listed on current medication list Requested Prescriptions  Pending Prescriptions Disp Refills   hydrochlorothiazide  (HYDRODIURIL ) 12.5 MG tablet [Pharmacy Med Name: HYDROCHLOROTHIAZIDE  12.5 MG TB] 90 tablet 0    Sig: TAKE 1 TABLET BY MOUTH EVERY DAY     Cardiovascular: Diuretics - Thiazide Failed - 09/11/2023 10:40 AM      Failed - Cr in normal range and within 180 days    Creat  Date Value Ref Range Status  03/07/2017 0.82 0.60 - 0.88 mg/dL Final    Comment:    For patients >69 years of age, the reference limit for Creatinine is approximately 13% higher for people identified as African-American. .    Creatinine, Ser  Date Value Ref Range Status  09/09/2023 1.07 (H) 0.44 - 1.00 mg/dL Final         Failed - Last BP in normal range    BP Readings from Last 1 Encounters:  09/09/23 (!) 146/77         Passed - K in normal range and within 180 days    Potassium  Date Value Ref Range Status  09/09/2023 3.9 3.5 - 5.1 mmol/L Final         Passed - Na in normal range and within 180 days    Sodium  Date Value Ref Range Status  09/09/2023 137 135 - 145 mmol/L Final  08/03/2023 141 134 - 144 mmol/L Final         Passed - Valid encounter within last 6 months    Recent Outpatient Visits           1 month ago Dependent edema   [place] [facility] Simmons-Robinson, [place], MD   2 months ago Primary hypertension   [place] [facility] [place], [place], MD   2 months ago Primary hypertension   [place] [facility] Br[place], [place], PA-C       Future Appointments             In 3 weeks W[place], Information systems manager, NP [facility]

## 2023-09-13 ENCOUNTER — Telehealth: Payer: Self-pay | Admitting: Family

## 2023-09-13 NOTE — Progress Notes (Signed)
 Advanced [facility] Failure Clinic Note   Referring Physician: recent ED visit PCP: Mimi Alt, MD (last seen 03/25) Cardiologist: Sammy Crisp, MD / Morey Ar, NP (last seen 03/25)  Chief Complaint: fatigue   HPI:  Deanna Williams is a 89 y/o female with a history of PAF (onset 04/22), HTN, ischemic stroke and chronic heart failure.   Admitted  09/01/2020 for right-sided weakness/numbness that started the night prior.  Numbness only on right side of face.  CTA positive for acute PCA occlusion-patient underwent endovascular revascularization and stent placement as detailed above.  Echo at that time demonstrated EF 60 to 65%, no RWMA, Grade I DD, normal RV size/function, mildly elevated PA pressure.  Lower extremity vascular ultrasound was negative for DVT bilaterally.  On 4/22 patient had a brief period of A-fib with rates in the 100s to 130s noted on telemetry.  She was discharged on 09/05/2020 with Eliquis  for PAF and Brilinta  per neurology for stroke   Echo 05/31/2022: EF 60 to 65%. No RWMA. Mild LVH. Grade I DD. Normal RV size/function. Mildly elevated PA pressure, RVSP 36.6 mmHg. Moderate LAE. Mild MR. Moderate TR. Aortic valve sclerosis without stenosis. Dilated IVC, RA pressure 8 mmHg   Was in the ED 09/09/23 with 2 week history of feet swelling. Had just been started on torsemide  the day prior.   She presents today, with her great granddaughter, for her initial HF visit with a chief complaint of moderate fatigue. Has associated shortness of breath, occasional palpitations, occasional dizziness, pedal edema, dry cough. Denies chest pain, abdominal distention or difficulty sleeping.   Denies tobacco, alcohol or drug use.   Review of Systems: [y] = yes, [ ]  = no   General: Weight gain [ ] ; Weight loss [ ] ; Anorexia [ ] ; Fatigue Davis.Dad ]; Fever [ ] ; Chills [ ] ; Weakness [ ]   Cardiac: Chest pain/pressure [ ] ; Resting SOB [ ] ; Exertional SOB Davis.Dad ]; Orthopnea [ ] ; Pedal Edema  Davis.Dad ]; Palpitations Davis.Dad ]; Syncope [ ] ; Presyncope [ ] ; Paroxysmal nocturnal dyspnea[ ]   Pulmonary: Cough Davis.Dad ]; Wheezing[ ] ; Hemoptysis[ ] ; Sputum [ ] ; Snoring [ ]   GI: Vomiting[ ] ; Dysphagia[ ] ; Melena[ ] ; Hematochezia [ ] ; Heartburn[ ] ; Abdominal pain [ ] ; Constipation [ ] ; Diarrhea [ ] ; BRBPR [ ]   GU: Hematuria[ ] ; Dysuria [ ] ; Nocturia[ ]   Vascular: Pain in legs with walking [ ] ; Pain in feet with lying flat [ ] ; Non-healing sores [ ] ; Stroke [ ] ; TIA [ ] ; Slurred speech [ ] ;  Neuro: Dizziness[y ]; Vertigo[ ] ; Seizures[ ] ; Paresthesias[ ] ;Blurred vision [ ] ; Diplopia [ ] ; Vision changes [ ]   Ortho/Skin: Arthritis [ ] ; Joint pain [ ] ; Muscle pain [ ] ; Joint swelling [ ] ; Back Pain [ ] ; Rash [ ]   Psych: Depression[ ] ; Anxiety[ ]   Heme: Bleeding problems [ ] ; Clotting disorders [ ] ; Anemia [ ]   Endocrine: Diabetes [ ] ; Thyroid  dysfunction[ ]    Past Medical History:  Diagnosis Date   Atrial fibrillation (HCC)    Gastroesophageal reflux disease    Hyperlipidemia    Hypertension    Lichen sclerosus    Lichen sclerosus et atrophicus    Osteopenia    Palpitations    Stroke [facility])     Current Outpatient Medications  Medication Sig Dispense Refill   amitriptyline  (ELAVIL ) 25 MG tablet Take 1 tablet (25 mg total) by mouth daily. 90 tablet 0   amLODipine  (NORVASC ) 5 MG tablet Take 5 mg by mouth daily.  In addition to 2.5 mg for a total of 7.5 mg     Ascorbic Acid  60 MG LOZG Use as directed 1 lozenge (60 mg total) in the mouth or throat daily. 60 lozenge 0   Cholecalciferol (VITAMIN D3) 25 MCG (1000 UT) CAPS Take 1,000 Units by mouth daily.     cyanocobalamin  (VITAMIN B12) 1000 MCG/ML injection Inject 1,000 mcg into the muscle every 30 (thirty) days.     cyanocobalamin  1000 MCG tablet Take 1,000 mcg by mouth daily.     ELIQUIS  5 MG TABS tablet TAKE 1 TABLET BY MOUTH TWICE A DAY 180 tablet 1   gabapentin  (NEURONTIN ) 100 MG capsule Take 1 capsule (100 mg total) by mouth at bedtime. (Patient not  taking: Reported on 08/03/2023) 30 capsule 5   metoprolol  succinate (TOPROL -XL) 25 MG 24 hr tablet TAKE 1 TABLET BY MOUTH DAILY. TAKE WITH OR IMMEDIATELY FOLLOWING A MEAL. 30 tablet 3   torsemide  (DEMADEX ) 20 MG tablet TAKE 1 TABLET BY MOUTH EVERY DAY 90 tablet 1   No current facility-administered medications for this visit.    No Known Allergies    Social History   Socioeconomic History   Marital status: Widowed    Spouse name: Not on file   Number of children: 1   Years of education: Not on file   Highest education level: 11th grade  Occupational History   Not on file  Tobacco Use   Smoking status: Never   Smokeless tobacco: Never  Vaping Use   Vaping status: Never Used  Substance and Sexual Activity   Alcohol use: Not Currently    Comment: 1 drink less than once a week.   Drug use: No   Sexual activity: Never  Other Topics Concern   Not on file  Social History Narrative   Not on file   Social Drivers of Health   Financial Resource Strain: Low Risk  (07/12/2023)   Overall Financial Resource Strain (CARDIA)    Difficulty of Paying Living Expenses: Not hard at all  Food Insecurity: No Food Insecurity (07/12/2023)   Hunger Vital Sign    Worried About Running Out of Food in the Last Year: Never true    Ran Out of Food in the Last Year: Never true  Transportation Needs: No Transportation Needs (07/12/2023)   PRAPARE - Administrator, Civil Service (Medical): No    Lack of Transportation (Non-Medical): No  Physical Activity: Inactive (07/12/2023)   Exercise Vital Sign    Days of Exercise per Week: 2 days    Minutes of Exercise per Session: 0 min  Stress: No Stress Concern Present (07/12/2023)   [employer] of Occupational Health - Occupational Stress Questionnaire    Feeling of Stress : Not at all  Social Connections: Not on file  Intimate Partner Violence: Not At Risk (07/12/2023)   Humiliation, Afraid, Rape, and Kick questionnaire    Fear of Current  or Ex-Partner: No    Emotionally Abused: No    Physically Abused: No    Sexually Abused: No      Family History  Problem Relation Age of Onset   Cancer Mother    Leukemia Mother    Cancer Father        colon   Hypertension Father    Arthritis Sister    Breast cancer Neg Hx    Vitals:   09/14/23 1448 09/14/23 1449  BP: (!) 121/58   Pulse: (!) 55 (!) 54  SpO2: 93%  96%  Weight: 142 lb (64.4 kg)    Wt Readings from Last 3 Encounters:  09/14/23 142 lb (64.4 kg)  09/09/23 147 lb (66.7 kg)  08/03/23 147 lb 3.2 oz (66.8 kg)   Lab Results  Component Value Date   CREATININE 1.07 (H) 09/09/2023   CREATININE 0.90 08/03/2023   CREATININE 0.96 06/27/2023    PHYSICAL EXAM:  General: Well appearing. No resp difficulty HEENT: normal Neck: supple, no JVD Cor: Regular rhythm, bradycardic. No rubs, gallops or murmurs Lungs: clear Abdomen: soft, nontender, nondistended. Extremities: no cyanosis, clubbing, rash, trace pitting edema Neuro: alert & oriented X 3. Moves all 4 extremities w/o difficulty. Affect pleasant   ECG: 09/11/23 in ED was NSR  ReDs reading: 35 %, normal   ASSESSMENT & PLAN:  1: Chronic heart failure with preserved ejection fraction- - ? Etiology due to PAF - NYHA class III - mildly fluid up with edema & symptoms - ReDs: 35% - encouraged to start weighing daily and parameters to call about discussed - Echo 05/31/22: EF 60-65% with mild LVH, G1DD, normal RV, mildly elevated PA pressure of 36.6 mmHg, moderate LAE, mild MR, moderate TR - decrease amlodipine  to 2.5mg  daily due to edema; plan to stop it at next visit - begin farxiga  10mg  daily; 30 day voucher provided. BMET next visit - continue metoprolol  25mg  daily. May decrease this to 12.5mg  daily since patient has HFpEF  - continue torsemide  20mg  daily - not adding salt to her food - has difficulty getting compression socks on/ off; encouraged her to try the zip up compression socks - BNP 09/09/23 was  297.0  2: HTN-  - BP 121/58 - decrease amlodipine  to 2.5mg  daily d/t edema; plan to stop it at next visit - saw PCP ([facility]) 03/25 - BMET 09/09/23 reviewed: sodium 137, potassium 3.9, creatinine 1.07 & GFR 49 - BMET next visit  3: PAF- - saw cardiology ([place]) 03/25 - continue apixaban  5mg  BID - continue metoprolol  succinate 25mg  daily - bradycardic today; may decrease metoprolol  at next visit to 12.5mg  daily   Return in 3 weeks, sooner if needed.   Charlette Console, FNP 09/13/23

## 2023-09-13 NOTE — Telephone Encounter (Signed)
 Called to confirm/remind patient of their appointment at the [facility] Failure Clinic on 09/14/23.   Appointment:   [x] Confirmed  [] Left mess   [] No answer/No voice mail  [] VM Full/unable to leave message  [] Phone not in service  Patient reminded to bring all medications and/or complete list.  Confirmed patient has transportation. Gave directions, instructed to utilize valet parking.

## 2023-09-14 ENCOUNTER — Encounter: Payer: Self-pay | Admitting: Family

## 2023-09-14 ENCOUNTER — Ambulatory Visit: Attending: Family | Admitting: Family

## 2023-09-14 VITALS — BP 121/58 | HR 54 | Wt 142.0 lb

## 2023-09-14 DIAGNOSIS — R600 Localized edema: Secondary | ICD-10-CM | POA: Insufficient documentation

## 2023-09-14 DIAGNOSIS — I5032 Chronic diastolic (congestive) heart failure: Secondary | ICD-10-CM | POA: Insufficient documentation

## 2023-09-14 DIAGNOSIS — Z8673 Personal history of transient ischemic attack (TIA), and cerebral infarction without residual deficits: Secondary | ICD-10-CM | POA: Diagnosis not present

## 2023-09-14 DIAGNOSIS — R001 Bradycardia, unspecified: Secondary | ICD-10-CM | POA: Diagnosis not present

## 2023-09-14 DIAGNOSIS — Z7901 Long term (current) use of anticoagulants: Secondary | ICD-10-CM | POA: Diagnosis not present

## 2023-09-14 DIAGNOSIS — Z95828 Presence of other vascular implants and grafts: Secondary | ICD-10-CM | POA: Diagnosis not present

## 2023-09-14 DIAGNOSIS — I110 Hypertensive heart disease with heart failure: Secondary | ICD-10-CM | POA: Diagnosis not present

## 2023-09-14 DIAGNOSIS — I480 Paroxysmal atrial fibrillation: Secondary | ICD-10-CM | POA: Diagnosis not present

## 2023-09-14 DIAGNOSIS — I10 Essential (primary) hypertension: Secondary | ICD-10-CM

## 2023-09-14 DIAGNOSIS — Z79899 Other long term (current) drug therapy: Secondary | ICD-10-CM | POA: Insufficient documentation

## 2023-09-14 MED ORDER — FARXIGA 10 MG PO TABS: 10.0000 mg | ORAL_TABLET | Freq: Every day | ORAL | 5 refills | Status: AC

## 2023-09-14 MED ORDER — AMLODIPINE BESYLATE 2.5 MG PO TABS
2.5000 mg | ORAL_TABLET | Freq: Every day | ORAL | 5 refills | Status: DC
Start: 2023-09-14 — End: 2023-10-05

## 2023-09-14 NOTE — Progress Notes (Signed)
 ReDS Vest / Clip - 09/14/23 1500       ReDS Vest / Clip   Station Marker A    Ruler Value 26    ReDS Value Range Low volume    ReDS Actual Value 35

## 2023-09-14 NOTE — Patient Instructions (Addendum)
 Medication Changes:  DECREASE AMLODIPINE  2.5 MG ONCE DAILY  START FARXIGA  10 MG ONCE DAILY   Special Instructions // Education:  Begin weighing daily and call for an overnight weight gain of 3 pounds or more or a weekly weight gain of more than 5 pounds.   Follow-Up in: 3-4 weeks with Shawnee Dellen, FNP.  At the [facility], you and your health needs are our priority. We have a designated team specialized in the treatment of Heart Failure. This Care Team includes your primary Heart Failure Specialized Cardiologist (physician), Advanced Practice Providers (APPs- Physician Assistants and Nurse Practitioners), and Pharmacist who all work together to provide you with the care you need, when you need it.   You may see any of the following providers on your designated Care Team at your next follow up:  Dr. Jules Oar Dr. Peder Bourdon Dr. Alwin Baars Dr. Judyth Nunnery Shawnee Dellen, FNP Bevely Brush, RPH-CPP  Please be sure to bring in all your medications bottles to every appointment.   Need to Contact Us :  If you have any questions or concerns before your next appointment please send us  a message through [facility] or call our office at [phone].    TO LEAVE A MESSAGE FOR THE NURSE SELECT OPTION 2, PLEASE LEAVE A MESSAGE INCLUDING: YOUR NAME DATE OF BIRTH CALL BACK NUMBER REASON FOR CALL**this is important as we prioritize the call backs  YOU WILL RECEIVE A CALL BACK THE SAME DAY AS LONG AS YOU CALL BEFORE 4:00 PM

## 2023-10-02 NOTE — Progress Notes (Signed)
 Cardiology Clinic Note   Date: 10/05/2023 ID: Jassmin, Kemmerer 1929/05/02, MRN [id number]  Primary Cardiologist:  Sammy Crisp, MD  Chief Complaint   Deanna Williams is a 89 y.o. female who presents to the clinic today for routine follow up.   Patient Profile   Deanna Williams is followed by Dr. Nolan Battle for the history outlined below.       Past medical history significant for: Chronic HFpEF. Echo 05/31/2022: EF 60 to 65%.  No RWMA.  Mild LVH.  Grade I DD.  Normal RV size/function.  Mildly elevated PA pressure, RVSP 36.6 mmHg.  Moderate LAE.  Mild MR.  Moderate TR.  Aortic valve sclerosis without stenosis.  Dilated IVC, RA pressure 8 mmHg. PAF. Onset April 2022 in the setting of acute stroke. 14-day ZIO 06/13/2022: HR 57 to 138 bpm, average 83 bpm.  Occasional PACs (2%).  Rare PVCs.  3 episodes of NSVT lasting up to 9 beats with max rate 210 bpm.  PAF (<1%) with longest episode lasting 3 minutes 46 seconds, average rate 124 bpm (range 97 to 154 bpm).  Patient triggered events corresponded to sinus rhythm, PACs, PVCs. Hypertension. Ischemic stroke.  Cerebral angiography 09/01/2020: S/p endovascular complete revascularization of the left posterior cerebral artery P1 segment.  S/p rescue stenting of reocclusion with restoration of TICI 3 recannulization of the left PCA territory  In summary, patient presented to the ED on 09/01/2020 for right-sided weakness/numbness that started the night prior.  Numbness only on right side of face.  CTA positive for acute PCA occlusion-patient underwent endovascular revascularization and stent placement as detailed above.  Echo at that time demonstrated EF 60 to 65%, no RWMA, Grade I DD, normal RV size/function, mildly elevated PA pressure.  Lower extremity vascular ultrasound was negative for DVT bilaterally.  On 4/22 patient had a brief period of A-fib with rates in the 100s to 130s noted on telemetry.  She was discharged on 09/05/2020 with Eliquis  for PAF  and Brilinta  per neurology for stroke. She had a one follow up visit with Dr. Lawana Pray in May 2022.  Patient was first evaluated by Dr. Nolan Battle on 05/20/2022 to establish care closer to home. She reported numbness and tingling of right leg and sensation of swelling/tightness of bilateral legs without actual swelling. She reports rare palpitations about once a month lasting 5 minutes without associated symptoms. She also reports occasional lightheadedness without syncope. She reported increased DOE. BP was elevated and amlodipine  was increased. Repeat echo showed normal LV function as detailed above. 14 day Zio showed <1% afib burden as detailed above.  Upon follow up in February 2024 BP was improved. Discussed ischemic evaluation for DOE but patient agreed to defer.   Patient was seen in the office by Dr. Nolan Battle on 07/12/2023 for routine follow up. She complained of increased lower extremity edema. She had been managing edema with elevation but edema never completely resolves. No change in chronic DOE reported. Amlodipine  was decreased by PCP, however, patient may have been taking 12.5 mg inadvertently. Patient was instructed to decrease amlodipine  to 5 mg, stop hydrochlorothiazide  and start Lasix  40 mg daily.    Patient was last seen in the office by me on 08/03/2023 for follow-up after medication changes.  She reported no change to lower extremity edema since starting Lasix .  She does notice improvement in edema with compression socks but is unable to wear them regularly as she cannot get them off by herself.  PCP change Lasix  to  torsemide  and instructed patient to continue amlodipine  7.5 mg.  She was instructed to reduce dose of amlodipine  to 5 mg as instructed by Dr. Nolan Battle in February.  Patient was seen by Shawnee Dellen, FNP and advanced heart failure clinic on 09/14/2023.  She was mildly fluid up at that time.  Amlodipine  was decreased to 2.5 mg daily.  She was started on Farxiga .     History of Present Illness     Today, patient is accompanied by her great granddaughter. She reports having a bad nights sleep last night secondary to being worried about her friend. Patient denies orthopnea or PND. No chest pain, pressure, or tightness. No palpitations. She reports lower extremity edema is unchanged since starting Farxiga . Her granddaughter noticed she seemed a little more winded walking to the exam room today but is unsure if this is related to having a bad night last night. She reports home weight has been between 140-145 lb. She has not tried her zip-up compression socks yet. She does not add salt to her food but has no control over how the food is prepared at her living facility.       ROS: All other systems reviewed and are otherwise negative except as noted in History of Present Illness.  EKGs/Labs Reviewed       EKG is not performed today.   09/09/2023: ALT 12; AST 18; BUN 16; Creatinine, Ser 1.07; Potassium 3.9; Sodium 137   09/09/2023: Hemoglobin 14.6; WBC 5.2   06/27/2023: TSH 3.630   06/27/2023: NT-Pro BNP 368 09/09/2023: B Natriuretic Peptide 297.0   Risk Assessment/Calculations     CHA2DS2-VASc Score = 6   This indicates a 9.7% annual risk of stroke. The patient's score is based upon: CHF History: 0 HTN History: 1 Diabetes History: 0 Stroke History: 2 Vascular Disease History: 0 Age Score: 2 Gender Score: 1             Physical Exam    VS:  BP 112/72 (BP Location: Left Arm, Patient Position: Sitting, Cuff Size: Normal)   Pulse 63   Ht 5\' 1"  (1.549 m)   Wt 146 lb (66.2 kg)   SpO2 97%   BMI 27.59 kg/m  , BMI Body mass index is 27.59 kg/m.  GEN: Well nourished, well developed, in no acute distress. Neck: No JVD or carotid bruits. Cardiac:  RRR. No murmurs. No rubs or gallops.   Respiratory:  Respirations regular and unlabored. Clear to auscultation without rales, wheezing or rhonchi. GI: Soft, nontender, nondistended. Extremities: Radials/DP/PT 2+ and equal  bilaterally. No clubbing or cyanosis. 1+ pitting edema L>R lower extremities.   Skin: Warm and dry, no rash. Neuro: Strength intact.  Assessment & Plan   Chronic HFpEF/lower extremity edema/Chronic DOE Echo January 2024 demonstrated normal LV/RV function, no RWMA, mild LVH, grade I DD, mildly elevated PA pressure, moderate LAE, mild MR, moderate TR. Patient reports lower extremity edema is unchanged since starting Farxiga  at the beginning of May. Weight has been between 140-145 lb. Granddaughter feels she may be slightly more winded walking to the exam room today but is unsure if this is related to her having a bad nights sleep last night. 1+ pitting edema L>R lower extremities otherwise euvolemic and well compensated on exam.  - Stop amlodipine .  - Take an extra dose of torsemide  (total of 40 mg) tomorrow and Sunday.  - Continue Toprol , Farxiga . - Keep previously scheduled follow-up with advanced heart failure clinic 5/29. BMP will be drawn at that  time.    PAF Onset April 2022. 14 day Zio January 2024 demonstrated <1% afib burden. Denies spontaneous bleeding concerns. Patient denies palpitations. RRR on exam today. -Continue Toprol  and Eliquis . Appropriate Eliquis  dose.     Hypertension BP today 112/72. Patient checks her BP at home but is unsure what readings she is getting.  - Stop amlodipine  given lower extremity edema.  -Continue Toprol .  Disposition: Stop amlodipine . Take an extra dose of Torsemide  (total of 40 mg) tomorrow and Sunday. Keep previously scheduled visit with AHF clinic on 5/29. BMP will be done at that time. Return in 6 months or sooner as needed.          Signed, Lonell Rives. Mukund Weinreb, DNP, NP-C

## 2023-10-05 ENCOUNTER — Encounter: Payer: Self-pay | Admitting: Student

## 2023-10-05 ENCOUNTER — Ambulatory Visit: Attending: Student | Admitting: Student

## 2023-10-05 VITALS — BP 112/72 | HR 63 | Ht 61.0 in | Wt 146.0 lb

## 2023-10-05 DIAGNOSIS — I10 Essential (primary) hypertension: Secondary | ICD-10-CM

## 2023-10-05 DIAGNOSIS — I5032 Chronic diastolic (congestive) heart failure: Secondary | ICD-10-CM | POA: Diagnosis not present

## 2023-10-05 DIAGNOSIS — R0609 Other forms of dyspnea: Secondary | ICD-10-CM | POA: Diagnosis not present

## 2023-10-05 DIAGNOSIS — R600 Localized edema: Secondary | ICD-10-CM | POA: Diagnosis not present

## 2023-10-05 DIAGNOSIS — I480 Paroxysmal atrial fibrillation: Secondary | ICD-10-CM | POA: Diagnosis not present

## 2023-10-05 NOTE — Patient Instructions (Addendum)
 Medication Instructions:  Your physician has recommended you make the following change in your medication:   STOP Amlodipine  Take extra tablet of Torsemide  tomorrow and Sunday  *If you need a refill on your cardiac medications before your next appointment, please call your pharmacy*  Lab Work: None  If you have labs (blood work) drawn today and your tests are completely normal, you will receive your results only by: MyChart Message (if you have MyChart) OR A paper copy in the mail If you have any lab test that is abnormal or we need to change your treatment, we will call you to review the results.  Testing/Procedures: None  Follow-Up: At [facility], you and your health needs are our priority.  As part of our continuing mission to provide you with exceptional heart care, our providers are all part of one team.  This team includes your primary Cardiologist (physician) and Advanced Practice Providers or APPs (Physician Assistants and Nurse Practitioners) who all work together to provide you with the care you need, when you need it.  Your next appointment:   6 month(s)  Provider:   Sammy Crisp, MD or Morey Ar, NP

## 2023-10-11 ENCOUNTER — Telehealth: Payer: Self-pay | Admitting: Family

## 2023-10-11 NOTE — Telephone Encounter (Signed)
 Called to confirm/remind patient of their appointment at the [facility] Clinic on 10/12/23.   Appointment:   [] Confirmed  [x] Left mess   [] No answer/No voice mail  [] VM Full/unable to leave message  [] Phone not in service  Patient reminded to bring all medications and/or complete list.  Confirmed patient has transportation. Gave directions, instructed to utilize valet parking.

## 2023-10-11 NOTE — Progress Notes (Unsigned)
 Advanced [facility] Failure Clinic Note   Referring Physician: recent ED visit PCP: Mimi Alt, MD (last seen 03/25) Cardiologist: Sammy Crisp, MD / Morey Ar, NP (last seen 03/25)  Chief Complaint: fatigue   HPI:  Ms Deanna Williams is a 89 y/o female with a history of PAF (onset 04/22), HTN, ischemic stroke and chronic heart failure.   Admitted  09/01/2020 for right-sided weakness/numbness that started the night prior.  Numbness only on right side of face.  CTA positive for acute PCA occlusion-patient underwent endovascular revascularization and stent placement as detailed above.  Echo at that time demonstrated EF 60 to 65%, no RWMA, Grade I DD, normal RV size/function, mildly elevated PA pressure.  Lower extremity vascular ultrasound was negative for DVT bilaterally.  On 4/22 patient had a brief period of A-fib with rates in the 100s to 130s noted on telemetry.  She was discharged on 09/05/2020 with Eliquis  for PAF and Brilinta  per neurology for stroke   Echo 05/31/2022: EF 60 to 65%. No RWMA. Mild LVH. Grade I DD. Normal RV size/function. Mildly elevated PA pressure, RVSP 36.6 mmHg. Moderate LAE. Mild MR. Moderate TR. Aortic valve sclerosis without stenosis. Dilated IVC, RA pressure 8 mmHg   Was in the ED 09/09/23 with 2 week history of feet swelling. Had just been started on torsemide  the day prior.   She presents today, with her great granddaughter, for her initial HF visit with a chief complaint of moderate fatigue. Has associated shortness of breath, occasional palpitations, occasional dizziness, pedal edema, dry cough. Denies chest pain, abdominal distention or difficulty sleeping.   Denies tobacco, alcohol or drug use.   Review of Systems: [y] = yes, [ ]  = no   General: Weight gain [ ] ; Weight loss [ ] ; Anorexia [ ] ; Fatigue Davis.Dad ]; Fever [ ] ; Chills [ ] ; Weakness [ ]   Cardiac: Chest pain/pressure [ ] ; Resting SOB [ ] ; Exertional SOB Davis.Dad ]; Orthopnea [ ] ; Pedal Edema  Davis.Dad ]; Palpitations Davis.Dad ]; Syncope [ ] ; Presyncope [ ] ; Paroxysmal nocturnal dyspnea[ ]   Pulmonary: Cough Davis.Dad ]; Wheezing[ ] ; Hemoptysis[ ] ; Sputum [ ] ; Snoring [ ]   GI: Vomiting[ ] ; Dysphagia[ ] ; Melena[ ] ; Hematochezia [ ] ; Heartburn[ ] ; Abdominal pain [ ] ; Constipation [ ] ; Diarrhea [ ] ; BRBPR [ ]   GU: Hematuria[ ] ; Dysuria [ ] ; Nocturia[ ]   Vascular: Pain in legs with walking [ ] ; Pain in feet with lying flat [ ] ; Non-healing sores [ ] ; Stroke [ ] ; TIA [ ] ; Slurred speech [ ] ;  Neuro: Dizziness[y ]; Vertigo[ ] ; Seizures[ ] ; Paresthesias[ ] ;Blurred vision [ ] ; Diplopia [ ] ; Vision changes [ ]   Ortho/Skin: Arthritis [ ] ; Joint pain [ ] ; Muscle pain [ ] ; Joint swelling [ ] ; Back Pain [ ] ; Rash [ ]   Psych: Depression[ ] ; Anxiety[ ]   Heme: Bleeding problems [ ] ; Clotting disorders [ ] ; Anemia [ ]   Endocrine: Diabetes [ ] ; Thyroid  dysfunction[ ]    Past Medical History:  Diagnosis Date   Atrial fibrillation (HCC)    Gastroesophageal reflux disease    Hyperlipidemia    Hypertension    Lichen sclerosus    Lichen sclerosus et atrophicus    Osteopenia    Palpitations    Stroke [facility])     Current Outpatient Medications  Medication Sig Dispense Refill   amitriptyline  (ELAVIL ) 25 MG tablet Take 1 tablet (25 mg total) by mouth daily. 90 tablet 0   Ascorbic Acid  60 MG LOZG Use as directed 1 lozenge (60  mg total) in the mouth or throat daily. 60 lozenge 0   Cholecalciferol (VITAMIN D3) 25 MCG (1000 UT) CAPS Take 1,000 Units by mouth daily.     cyanocobalamin  (VITAMIN B12) 1000 MCG/ML injection Inject 1,000 mcg into the muscle every 30 (thirty) days.     cyanocobalamin  1000 MCG tablet Take 1,000 mcg by mouth daily.     ELIQUIS  5 MG TABS tablet TAKE 1 TABLET BY MOUTH TWICE A DAY 180 tablet 1   FARXIGA  10 MG TABS tablet Take 1 tablet (10 mg total) by mouth daily before breakfast. 30 tablet 5   gabapentin  (NEURONTIN ) 100 MG capsule Take 1 capsule (100 mg total) by mouth at bedtime. 30 capsule 5    metoprolol  succinate (TOPROL -XL) 25 MG 24 hr tablet TAKE 1 TABLET BY MOUTH DAILY. TAKE WITH OR IMMEDIATELY FOLLOWING A MEAL. 30 tablet 3   torsemide  (DEMADEX ) 20 MG tablet TAKE 1 TABLET BY MOUTH EVERY DAY 90 tablet 1   No current facility-administered medications for this visit.    No Known Allergies    Social History   Socioeconomic History   Marital status: Widowed    Spouse name: Not on file   Number of children: 1   Years of education: Not on file   Highest education level: 11th grade  Occupational History   Not on file  Tobacco Use   Smoking status: Never   Smokeless tobacco: Never  Vaping Use   Vaping status: Never Used  Substance and Sexual Activity   Alcohol use: Not Currently    Comment: 1 drink less than once a week.   Drug use: No   Sexual activity: Never  Other Topics Concern   Not on file  Social History Narrative   Not on file   Social Drivers of Health   Financial Resource Strain: Low Risk  (07/12/2023)   Overall Financial Resource Strain (CARDIA)    Difficulty of Paying Living Expenses: Not hard at all  Food Insecurity: No Food Insecurity (07/12/2023)   Hunger Vital Sign    Worried About Running Out of Food in the Last Year: Never true    Ran Out of Food in the Last Year: Never true  Transportation Needs: No Transportation Needs (07/12/2023)   PRAPARE - Administrator, Civil Service (Medical): No    Lack of Transportation (Non-Medical): No  Physical Activity: Inactive (07/12/2023)   Exercise Vital Sign    Days of Exercise per Week: 2 days    Minutes of Exercise per Session: 0 min  Stress: No Stress Concern Present (07/12/2023)   [employer] of Occupational Health - Occupational Stress Questionnaire    Feeling of Stress : Not at all  Social Connections: Not on file  Intimate Partner Violence: Not At Risk (07/12/2023)   Humiliation, Afraid, Rape, and Kick questionnaire    Fear of Current or Ex-Partner: No    Emotionally Abused: No     Physically Abused: No    Sexually Abused: No      Family History  Problem Relation Age of Onset   Cancer Mother    Leukemia Mother    Cancer Father        colon   Hypertension Father    Arthritis Sister    Breast cancer Neg Hx    There were no vitals filed for this visit.  Wt Readings from Last 3 Encounters:  10/05/23 146 lb (66.2 kg)  09/14/23 142 lb (64.4 kg)  09/09/23 147 lb (66.7  kg)   Lab Results  Component Value Date   CREATININE 1.07 (H) 09/09/2023   CREATININE 0.90 08/03/2023   CREATININE 0.96 06/27/2023    PHYSICAL EXAM:  General: Well appearing. No resp difficulty HEENT: normal Neck: supple, no JVD Cor: Regular rhythm, bradycardic. No rubs, gallops or murmurs Lungs: clear Abdomen: soft, nontender, nondistended. Extremities: no cyanosis, clubbing, rash, trace pitting edema Neuro: alert & oriented X 3. Moves all 4 extremities w/o difficulty. Affect pleasant   ECG: 09/11/23 in ED was NSR  ReDs reading: 35 %, normal   ASSESSMENT & PLAN:  1: Chronic heart failure with preserved ejection fraction- - ? Etiology due to PAF - NYHA class III - mildly fluid up with edema & symptoms - ReDs: 35% - encouraged to start weighing daily and parameters to call about discussed - Echo 05/31/22: EF 60-65% with mild LVH, G1DD, normal RV, mildly elevated PA pressure of 36.6 mmHg, moderate LAE, mild MR, moderate TR - decrease amlodipine  to 2.5mg  daily due to edema; plan to stop it at next visit - begin farxiga  10mg  daily; 30 day voucher provided. BMET next visit - continue metoprolol  25mg  daily. May decrease this to 12.5mg  daily since patient has HFpEF  - continue torsemide  20mg  daily - not adding salt to her food - has difficulty getting compression socks on/ off; encouraged her to try the zip up compression socks - BNP 09/09/23 was 297.0  2: HTN-  - BP 121/58 - decrease amlodipine  to 2.5mg  daily d/t edema; plan to stop it at next visit - saw PCP ([facility])  03/25 - BMET 09/09/23 reviewed: sodium 137, potassium 3.9, creatinine 1.07 & GFR 49 - BMET next visit  3: PAF- - saw cardiology ([place]) 03/25 - continue apixaban  5mg  BID - continue metoprolol  succinate 25mg  daily - bradycardic today; may decrease metoprolol  at next visit to 12.5mg  daily   Return in 3 weeks, sooner if needed.   Charlette Console, FNP 10/11/23

## 2023-10-12 ENCOUNTER — Encounter: Payer: Self-pay | Admitting: Family

## 2023-10-12 ENCOUNTER — Ambulatory Visit: Admitting: Family

## 2023-10-12 ENCOUNTER — Ambulatory Visit: Payer: Self-pay | Admitting: Family

## 2023-10-12 ENCOUNTER — Other Ambulatory Visit
Admission: RE | Admit: 2023-10-12 | Discharge: 2023-10-12 | Disposition: A | Discharge status: Home / Self Care | Source: Ambulatory Visit | Attending: Family | Admitting: Family

## 2023-10-12 VITALS — BP 145/64 | HR 58 | Wt 145.0 lb

## 2023-10-12 DIAGNOSIS — Z79899 Other long term (current) drug therapy: Secondary | ICD-10-CM | POA: Insufficient documentation

## 2023-10-12 DIAGNOSIS — I480 Paroxysmal atrial fibrillation: Secondary | ICD-10-CM | POA: Insufficient documentation

## 2023-10-12 DIAGNOSIS — I5032 Chronic diastolic (congestive) heart failure: Secondary | ICD-10-CM | POA: Diagnosis present

## 2023-10-12 DIAGNOSIS — Z7901 Long term (current) use of anticoagulants: Secondary | ICD-10-CM | POA: Diagnosis not present

## 2023-10-12 DIAGNOSIS — I110 Hypertensive heart disease with heart failure: Secondary | ICD-10-CM | POA: Diagnosis not present

## 2023-10-12 DIAGNOSIS — Z8673 Personal history of transient ischemic attack (TIA), and cerebral infarction without residual deficits: Secondary | ICD-10-CM | POA: Insufficient documentation

## 2023-10-12 DIAGNOSIS — I10 Essential (primary) hypertension: Secondary | ICD-10-CM | POA: Diagnosis not present

## 2023-10-12 DIAGNOSIS — Z8249 Family history of ischemic heart disease and other diseases of the circulatory system: Secondary | ICD-10-CM | POA: Insufficient documentation

## 2023-10-12 LAB — BASIC METABOLIC PANEL WITH GFR
Anion gap: 9 (ref 5–15)
BUN: 18 mg/dL (ref 8–23)
CO2: 28 mmol/L (ref 22–32)
Calcium: 9.4 mg/dL (ref 8.9–10.3)
Chloride: 102 mmol/L (ref 98–111)
Creatinine, Ser: 1 mg/dL (ref 0.44–1.00)
GFR, Estimated: 53 mL/min — ABNORMAL LOW (ref >60)
Glucose, Bld: 164 mg/dL — ABNORMAL HIGH (ref 70–99)
Potassium: 3.8 mmol/L (ref 3.5–5.1)
Sodium: 139 mmol/L (ref 135–145)

## 2023-10-12 MED ORDER — TORSEMIDE 40 MG PO TABS: 40.0000 mg | ORAL_TABLET | Freq: Every day | ORAL | 3 refills | Status: AC

## 2023-10-12 NOTE — Patient Instructions (Signed)
 Medication Changes:  INCREASE TORSEMIDE  TO 40 MG ONCE DAILY   Lab Work:  Go DOWN to LOWER LEVEL (LL) to have your blood work completed inside of [employer] office.  We will only call you if the results are abnormal or if the provider would like to make medication changes.   Follow-Up: AS NEEDED OR IF YOU HAVE ANY NEW OR WORSENING SYMPTOMS.  At the [facility], you and your health needs are our priority. We have a designated team specialized in the treatment of Heart Failure. This Care Team includes your primary Heart Failure Specialized Cardiologist (physician), Advanced Practice Providers (APPs- Physician Assistants and Nurse Practitioners), and Pharmacist who all work together to provide you with the care you need, when you need it.   You may see any of the following providers on your designated Care Team at your next follow up:  Dr. Jules Oar Dr. Peder Bourdon Dr. Alwin Baars Dr. Judyth Nunnery Shawnee Dellen, FNP Bevely Brush, RPH-CPP  Please be sure to bring in all your medications bottles to every appointment.   Need to Contact Us :  If you have any questions or concerns before your next appointment please send us  a message through [place] or call our office at [phone].    TO LEAVE A MESSAGE FOR THE NURSE SELECT OPTION 2, PLEASE LEAVE A MESSAGE INCLUDING: YOUR NAME DATE OF BIRTH CALL BACK NUMBER REASON FOR CALL**this is important as we prioritize the call backs  YOU WILL RECEIVE A CALL BACK THE SAME DAY AS LONG AS YOU CALL BEFORE 4:00 PM

## 2023-10-12 NOTE — Progress Notes (Signed)
 ReDS Vest / Clip - 10/12/23 1444       ReDS Vest / Clip   Station Marker A    Ruler Value 24    ReDS Value Range Low volume    ReDS Actual Value 35

## 2023-10-17 NOTE — Telephone Encounter (Signed)
-----   Message from Charlette Console sent at 10/12/2023  4:33 PM EDT ----- Kidney function looks great and potassium level is normal. With increasing the torsemide  to 40mg  daily, I'd like BMET to be rechecked in ~ 1 month to make sure there's no need for potassium supplements

## 2023-10-17 NOTE — Telephone Encounter (Signed)
 Reviewed lab results and lab orders with pt. Pt verbalized understanding and is agreeable to plan.

## 2023-10-31 ENCOUNTER — Other Ambulatory Visit: Payer: Self-pay | Admitting: Internal Medicine

## 2023-11-11 IMAGING — CT CT ANGIO HEAD-NECK (W OR W/O PERF)
1 of 11 series · 5 of 33 positions shown · IV contrast (omnipaque)
Comparison: 09/01/2020

CLINICAL DATA: CVA

EXAM:
CT ANGIOGRAPHY HEAD AND NECK
TECHNIQUE: Multidetector CT imaging of the head and neck was performed using
the standard protocol during bolus administration of intravenous
contrast. Multiplanar CT image reconstructions and MIPs were
obtained to evaluate the vascular anatomy. Carotid stenosis
measurements (when applicable) are obtained utilizing NASCET
criteria, using the distal internal carotid diameter as the
denominator.
CONTRAST:  75mL OMNIPAQUE IOHEXOL 350 MG/ML SOLN

[Series 9: cta neck axial · axial · 0.39mm/px · z∈[-212,-11]mm · 5 of 315 slices shown]
[im 53/315  soft-tissue]
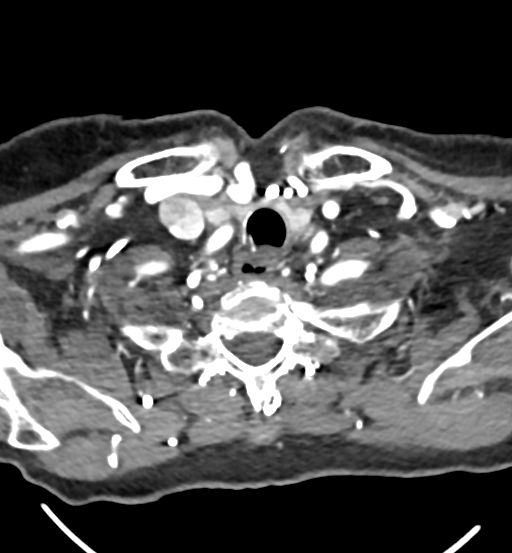
[im 105/315  bone]
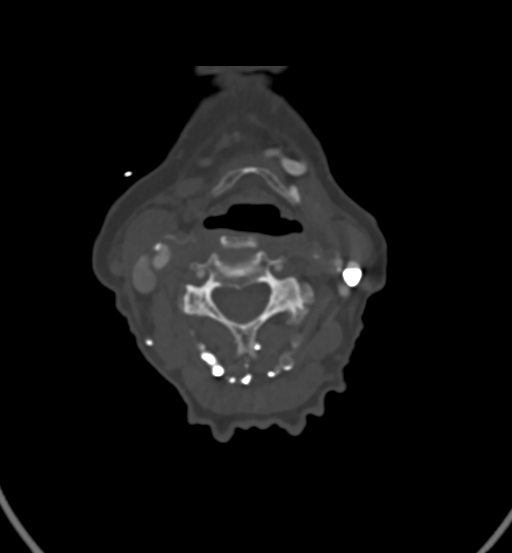
[im 158/315  soft-tissue]
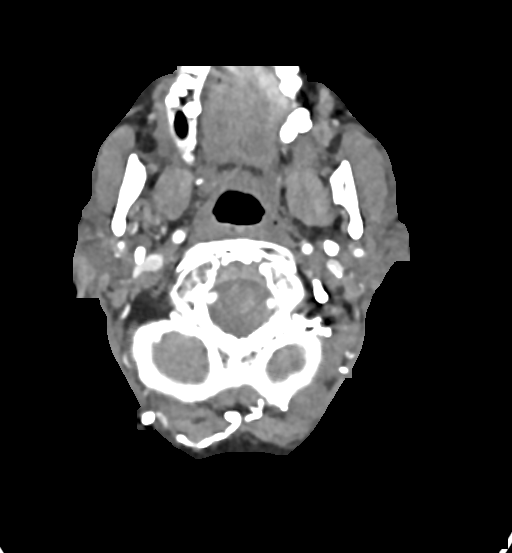
[im 210/315  bone]
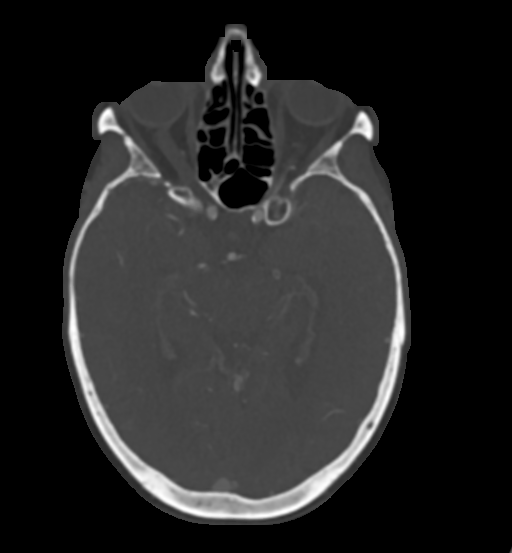
[im 262/315  soft-tissue]
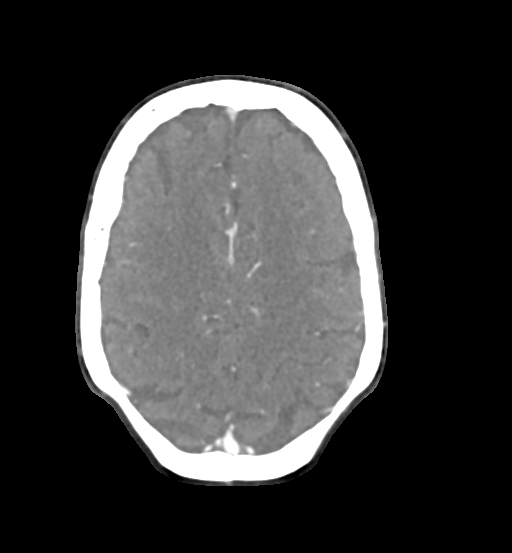

[5 of 33 positions shown; findings below may reference images not displayed]

FINDINGS: CT HEAD FINDINGS

Brain: There is no mass, hemorrhage or extra-axial collection. The
size and configuration of the ventricles and extra-axial CSF spaces
are normal. There is hypoattenuation of the periventricular white
matter, most commonly indicating chronic ischemic microangiopathy.

Skull: The visualized skull base, calvarium and extracranial soft
tissues are normal.

Sinuses/Orbits: No fluid levels or advanced mucosal thickening of
the visualized paranasal sinuses. No mastoid or middle ear effusion.
The orbits are normal.

CTA NECK FINDINGS

SKELETON: There is no bony spinal canal stenosis. No lytic or
blastic lesion.

OTHER NECK: Normal pharynx, larynx and major salivary glands. No
cervical lymphadenopathy. Unremarkable thyroid gland.

UPPER CHEST: No pneumothorax or pleural effusion. No nodules or
masses.

AORTIC ARCH:

There is calcific atherosclerosis of the aortic arch. There is no
aneurysm, dissection or hemodynamically significant stenosis of the
visualized portion of the aorta. Conventional 3 vessel aortic
branching pattern. The visualized proximal subclavian arteries are
widely patent.

RIGHT CAROTID SYSTEM: No dissection, occlusion or aneurysm. Mild
atherosclerotic calcification at the carotid bifurcation without
hemodynamically significant stenosis.

LEFT CAROTID SYSTEM: Normal without aneurysm, dissection or
stenosis.

VERTEBRAL ARTERIES: Right dominant configuration. Both origins are
clearly patent. There is no dissection, occlusion or flow-limiting
stenosis to the skull base (V1-V3 segments).

CTA HEAD FINDINGS

POSTERIOR CIRCULATION:

--Vertebral arteries: Mild atherosclerosis without stenosis

--Inferior cerebellar arteries: Normal.

--Basilar artery: Normal.

--Superior cerebellar arteries: Normal.

--Posterior cerebral arteries (PCA): There is a stent within the
left PCA P1 and P2 segments. The left PCA is patent distal to the
stent. Right PCA normal.

ANTERIOR CIRCULATION:

--Intracranial internal carotid arteries: Atherosclerotic
calcification of the internal carotid arteries at the skull base
without hemodynamically significant stenosis.

--Anterior cerebral arteries (ACA): Normal. Both A1 segments are
present. Patent anterior communicating artery (a-comm).

--Middle cerebral arteries (MCA): Normal.

VENOUS SINUSES: As permitted by contrast timing, patent.

ANATOMIC VARIANTS: None

Review of the MIP images confirms the above findings.
IMPRESSION: 1. No emergent large vessel occlusion or high-grade stenosis.
2. Status post left PCA P1 and P2 segment stent placement with
distal left PCA patency.
3. Chronic ischemic microangiopathy.

Aortic Atherosclerosis ([id number]).

## 2023-12-06 ENCOUNTER — Encounter: Payer: Self-pay | Admitting: Family Medicine

## 2023-12-06 ENCOUNTER — Ambulatory Visit (INDEPENDENT_AMBULATORY_CARE_PROVIDER_SITE_OTHER): Admitting: Family Medicine

## 2023-12-06 VITALS — BP 133/68 | HR 59 | Resp 16 | Ht 61.0 in | Wt 139.7 lb

## 2023-12-06 DIAGNOSIS — H60501 Unspecified acute noninfective otitis externa, right ear: Secondary | ICD-10-CM

## 2023-12-06 MED ORDER — NEOMYCIN-POLYMYXIN-HC 3.5-10000-1 OT SOLN: 3.0000 [drp] | Freq: Four times a day (QID) | OTIC | 0 refills | Status: AC

## 2023-12-06 NOTE — Progress Notes (Signed)
      Established patient visit   Patient: Deanna Williams   DOB: October 24, 1928   89 y.o. Female  MRN: [id number] Visit Date: 12/06/2023  Today's healthcare provider: Nancyann Perry, MD   Chief Complaint  Patient presents with   Ear Problem    R ear swollen, pain onset 5 days Ear canal unable to see through it Saturday but better today, mild drainage.   Subjective    Discussed the use of AI scribe software for clinical note transcription with the patient, who gave verbal consent to proceed.  History of Present Illness   Deanna Williams is a 89 year old female who presents with right ear swelling and fluid leakage.  She has experienced swelling in her right ear for approximately two weeks. The swelling was significant enough to close the ear canal completely on Saturday. Initially, she noticed soreness on Friday, which she attributed to possibly sleeping on it wrong. By Sunday, the ear was visibly swollen shut. On Monday, she observed fluid leakage from the ear. Since then, the swelling has decreased, and she no longer experiences pain.  Additionally, she mentions having swollen feet, although they are not as swollen as before, and she is able to wear her regular shoes.  She reports no current pain in the ear and denies any allergies.       Medications: Outpatient Medications Prior to Visit  Medication Sig   amitriptyline  (ELAVIL ) 25 MG tablet Take 1 tablet (25 mg total) by mouth daily.   Ascorbic Acid  60 MG LOZG Use as directed 1 lozenge (60 mg total) in the mouth or throat daily.   Cholecalciferol (VITAMIN D3) 25 MCG (1000 UT) CAPS Take 1,000 Units by mouth daily.   cyanocobalamin  (VITAMIN B12) 1000 MCG/ML injection Inject 1,000 mcg into the muscle every 30 (thirty) days.   cyanocobalamin  1000 MCG tablet Take 1,000 mcg by mouth daily.   ELIQUIS  5 MG TABS tablet TAKE 1 TABLET BY MOUTH TWICE A DAY   FARXIGA  10 MG TABS tablet Take 1 tablet (10 mg total) by mouth daily before  breakfast.   gabapentin  (NEURONTIN ) 100 MG capsule Take 1 capsule (100 mg total) by mouth at bedtime.   metoprolol  succinate (TOPROL -XL) 25 MG 24 hr tablet TAKE 1 TABLET BY MOUTH DAILY. TAKE WITH OR IMMEDIATELY FOLLOWING A MEAL.   Torsemide  40 MG TABS Take 40 mg by mouth daily.   No facility-administered medications prior to visit.        Objective    BP 133/68 (BP Location: Right Arm, Patient Position: Sitting, Cuff Size: Normal)   Pulse (!) 59   Resp 16   Ht 5' 1 (1.549 m)   Wt 139 lb 11.2 oz (63.4 kg)   SpO2 97%   BMI 26.40 kg/m     Physical Exam   HEENT: Right ear canal inflamed.       Assessment & Plan        Otitis Externa Right ear infection with mild inflammation, consistent with otitis externa. - Prescribed cortisporin hc ear drops three to four times daily. - Advised to report if no significant improvement within a week.    No follow-ups on file.     Nancyann Perry, MD  [facility] [phone] (phone) [phone] (fax)  [facility]

## 2023-12-21 ENCOUNTER — Other Ambulatory Visit: Payer: Self-pay | Admitting: Family Medicine

## 2024-01-09 ENCOUNTER — Encounter: Payer: Self-pay | Admitting: Family Medicine

## 2024-01-09 ENCOUNTER — Ambulatory Visit (INDEPENDENT_AMBULATORY_CARE_PROVIDER_SITE_OTHER): Admitting: Family Medicine

## 2024-01-09 VITALS — BP 145/66 | HR 56 | Resp 16 | Wt 144.0 lb

## 2024-01-09 DIAGNOSIS — I5032 Chronic diastolic (congestive) heart failure: Secondary | ICD-10-CM | POA: Insufficient documentation

## 2024-01-09 DIAGNOSIS — R300 Dysuria: Secondary | ICD-10-CM | POA: Diagnosis not present

## 2024-01-09 DIAGNOSIS — I10 Essential (primary) hypertension: Secondary | ICD-10-CM

## 2024-01-09 DIAGNOSIS — R609 Edema, unspecified: Secondary | ICD-10-CM

## 2024-01-09 DIAGNOSIS — I4891 Unspecified atrial fibrillation: Secondary | ICD-10-CM

## 2024-01-09 LAB — POCT URINALYSIS DIPSTICK
Bilirubin, UA: NEGATIVE
Blood, UA: NEGATIVE
Glucose, UA: NEGATIVE
Ketones, UA: NEGATIVE
Leukocytes, UA: NEGATIVE
Nitrite, UA: NEGATIVE
Protein, UA: NEGATIVE
Spec Grav, UA: 1.01 (ref 1.010–1.025)
Urobilinogen, UA: 0.2 U/dL
pH, UA: 6.5 (ref 5.0–8.0)

## 2024-01-09 MED ORDER — PHENAZOPYRIDINE HCL 100 MG PO TABS: 100.0000 mg | ORAL_TABLET | Freq: Three times a day (TID) | ORAL | 0 refills | Status: AC | PRN

## 2024-01-09 NOTE — Progress Notes (Signed)
 Established patient visit   Patient: Deanna Williams   DOB: Jun 09, 1928   89 y.o. Female  MRN: [id number] Visit Date: 01/09/2024  Today's healthcare provider: Rockie Agent, MD   Chief Complaint  Patient presents with   Cyst    Cyst on right hand.  Frequency: x several months Patient here with great grandaughter   Dysuria    Patient just started having some urinary symptoms. Burning while urinating.  Patient was just treated for UTI.   Subjective     HPI     Cyst    Additional comments: Cyst on right hand.  Frequency: x several months Patient here with great grandaughter        Dysuria    Additional comments: Patient just started having some urinary symptoms. Burning while urinating.  Patient was just treated for UTI.      Last edited by Rosas, Joseline E, CMA on 01/09/2024 11:05 AM.       Discussed the use of AI scribe software for clinical note transcription with the patient, who gave verbal consent to proceed.  History of Present Illness Deanna Williams is a 89 year old female who presents with burning urination and a ganglion cyst on her right hand. She is accompanied by her daughter.  She has been experiencing burning urination since completing a course of antibiotics for a urinary tract infection treated around July 3rd with cefdinir 300 mg twice daily for ten days. The symptoms never fully resolved and have persisted for approximately two months. No blood in urine, fever, or abdominal pain. She suspects her symptoms might be related to her medication, Farxiga , which she has been taking since May. She reports increased frequency of urination.  She has a history of chronic heart failure with preserved ejection fraction and atrial fibrillation. Her current medications include torsemide  40 mg daily for edema, metoprolol  24 mg daily for heart failure, and Farxiga  10 mg daily. She was previously instructed to increase torsemide  to help with bilateral  lower extremity edema, which has improved but still presents as tightness rather than visible swelling. She uses compression stockings intermittently, which helps when worn consistently.  She also reports a ganglion cyst on her right hand, which has been present for a few months and is causing discomfort. She is left-handed, and the cyst is becoming bothersome in daily activities.     Past Medical History:  Diagnosis Date   Atrial fibrillation (HCC)    Cerebral infarction, unspecified (HCC) 09/01/2020   Gastroesophageal reflux disease    Hyperlipidemia    Hypertension    Lichen sclerosus    Lichen sclerosus et atrophicus    Osteopenia    Palpitations    Stroke [facility])     Medications: Outpatient Medications Prior to Visit  Medication Sig   amitriptyline  (ELAVIL ) 25 MG tablet Take 1 tablet (25 mg total) by mouth daily.   Cholecalciferol (VITAMIN D3) 25 MCG (1000 UT) CAPS Take 1,000 Units by mouth daily.   cyanocobalamin  (VITAMIN B12) 1000 MCG/ML injection Inject 1,000 mcg into the muscle every 30 (thirty) days.   cyanocobalamin  1000 MCG tablet Take 1,000 mcg by mouth daily.   ELIQUIS  5 MG TABS tablet TAKE 1 TABLET BY MOUTH TWICE A DAY   FARXIGA  10 MG TABS tablet Take 1 tablet (10 mg total) by mouth daily before breakfast.   metoprolol  succinate (TOPROL -XL) 25 MG 24 hr tablet TAKE 1 TABLET BY MOUTH DAILY. TAKE WITH OR IMMEDIATELY FOLLOWING A MEAL.  Torsemide  40 MG TABS Take 40 mg by mouth daily.   Ascorbic Acid  60 MG LOZG Use as directed 1 lozenge (60 mg total) in the mouth or throat daily.   gabapentin  (NEURONTIN ) 100 MG capsule Take 1 capsule (100 mg total) by mouth at bedtime.   No facility-administered medications prior to visit.    Review of Systems  Last metabolic panel Lab Results  Component Value Date   GLUCOSE 164 (H) 10/12/2023   NA 139 10/12/2023   K 3.8 10/12/2023   CL 102 10/12/2023   CO2 28 10/12/2023   BUN 18 10/12/2023   CREATININE 1.00 10/12/2023    GFRNONAA 53 (L) 10/12/2023   CALCIUM  9.4 10/12/2023   PHOS 2.6 09/02/2020   PROT 7.1 09/09/2023   ALBUMIN 4.2 09/09/2023   LABGLOB 2.0 06/27/2023   AGRATIO 2.1 10/17/2022   BILITOT 0.6 09/09/2023   ALKPHOS 66 09/09/2023   AST 18 09/09/2023   ALT 12 09/09/2023   ANIONGAP 9 10/12/2023        Objective    BP (!) 145/66 (BP Location: Right Arm, Patient Position: Sitting, Cuff Size: Normal)   Pulse (!) 56   Resp 16   Wt 144 lb (65.3 kg)   SpO2 96%   BMI 27.21 kg/m  BP Readings from Last 3 Encounters:  01/09/24 (!) 145/66  12/06/23 133/68  10/12/23 (!) 145/64   Wt Readings from Last 3 Encounters:  01/09/24 144 lb (65.3 kg)  12/06/23 139 lb 11.2 oz (63.4 kg)  10/12/23 145 lb (65.8 kg)        Physical Exam  Physical Exam ABDOMEN: Abdomen non-tender to palpation. No suprpubic tenderness, no CVA tenderness  MUSCULOSKELETAL: Ganglion cyst on right hand palm     Results for orders placed or performed in visit on 01/09/24  POCT urinalysis dipstick  Result Value Ref Range   Color, UA Light Yellow    Clarity, UA Clear    Glucose, UA Negative Negative   Bilirubin, UA Negative    Ketones, UA Negative    Spec Grav, UA 1.010 1.010 - 1.025   Blood, UA Negative    pH, UA 6.5 5.0 - 8.0   Protein, UA Negative Negative   Urobilinogen, UA 0.2 0.2 or 1.0 E.U./dL   Nitrite, UA Negative    Leukocytes, UA Negative Negative   Appearance     Odor      Assessment & Plan     Problem List Items Addressed This Visit     Atrial fibrillation (HCC)   Chronic heart failure with preserved ejection fraction (HCC)   Dependent edema   Hypertension goal BP (blood pressure) < 140/90   Other Visit Diagnoses       Dysuria    -  Primary   Relevant Medications   phenazopyridine  (PYRIDIUM ) 100 MG tablet   Other Relevant Orders   Urine Culture   POCT urinalysis dipstick (Completed)       Assessment and Plan Assessment & Plan Ganglion cyst of right hand Ganglion cyst on the right  hand causing discomfort and increasing in size. Previously offered aspiration by [facility], but surgical removal may be necessary due to recurrence risk. - Refer to hand specialist for evaluation and potential aspiration or surgical removal  Dysuria likely secondary to SGLT2 inhibitor (Farxiga ) acute Dysuria potentially related to Farxiga  use, which can cause urinary irritation due to increased glucose excretion. Symptoms persist despite previous UTI treatment. - Hold Farxiga  to assess improvement in dysuria - Prescribe  Pyridium  100 mg three times daily for dysuria - Send urine culture - Reassess symptoms in one month  Edema of lower extremities improved Edema improved with torsemide  40 mg daily. Compression socks and elevation provide additional relief. - Continue torsemide  40 mg daily - Use compression socks and elevate legs as needed  Heart failure with preserved ejection fraction Chronic heart failure with preserved ejection fraction. Farxiga  was prescribed for renal and cardiac protection, but may be contributing to dysuria. - Hold Farxiga  due to dysuria - continue metoprolol  25mg  daily, torsemide  40mg  daily  Atrial fibrillation Chronic atrial fibrillation managed with elaprase.  Hypertension Chronic, within goal range given patient's advanced age  Hypertension managed with amlodipine  and metoprolol . Recent cardiology visit recommended continuation of these medications. - Continue amlodipine  5 mg daily - Continue metoprolol  25 mg daily     Return in about 6 weeks (around 02/20/2024) for dysuria after stopping farxiga .         Rockie Agent, MD  [facility] [phone] (phone) [phone] (fax)  [facility]

## 2024-01-09 NOTE — Patient Instructions (Addendum)
     Please Contact [facility], Dr. Kayla Pinal to discuss drainage of the cyst on your right hand    Please stop Farxiga  for now to see if this helps with your urinary symptoms and follow up with me in October and I will send a message to your cardiology specialist     VISIT SUMMARY: During your visit, we discussed your ongoing issues with burning urination, a ganglion cyst on your right hand, and your current medications for heart failure, atrial fibrillation, and hypertension. We have made some adjustments to your medications and provided a referral for further evaluation of your cyst.  YOUR PLAN: GANGLION CYST OF RIGHT HAND: You have a ganglion cyst on your right hand that is causing discomfort and increasing in size. -We will refer you to a hand specialist for evaluation and potential aspiration or surgical removal.  DYSURIA LIKELY SECONDARY TO SGLT2 INHIBITOR (FARXIGA ): Your burning urination may be related to your medication, Farxiga , which can cause urinary irritation. -Stop taking Farxiga  to see if your symptoms improve. -We have prescribed Pyridium  100 mg three times daily to help with the burning sensation. -A urine culture will be done to check for any infections. -We will reassess your symptoms in one month.  EDEMA OF LOWER EXTREMITIES: Your leg swelling has improved with your current medication but still causes some tightness. -Continue taking torsemide  40 mg daily. -Use compression socks and elevate your legs as needed.  HEART FAILURE WITH PRESERVED EJECTION FRACTION: You have chronic heart failure with preserved ejection fraction. -Stop taking Farxiga  due to its potential contribution to your urinary symptoms.  ATRIAL FIBRILLATION: You have chronic atrial fibrillation. -Continue managing with your current medication, Eliquis    HYPERTENSION: Your high blood pressure is being managed with amlodipine  and metoprolol . -Continue taking amlodipine  5 mg daily. -Continue  taking metoprolol  25 mg daily.                      Contains text generated by Abridge.                                 Contains text generated by Abridge.                   Contains text generated by Abridge.                                 Contains text generated by Abridge.

## 2024-01-11 ENCOUNTER — Ambulatory Visit: Payer: Self-pay | Admitting: Family Medicine

## 2024-01-11 LAB — URINE CULTURE

## 2024-01-11 LAB — SPECIMEN STATUS REPORT

## 2024-01-24 ENCOUNTER — Ambulatory Visit

## 2024-01-24 DIAGNOSIS — Z0000 Encounter for general adult medical examination without abnormal findings: Secondary | ICD-10-CM

## 2024-01-24 NOTE — Progress Notes (Signed)
 Subjective:   Deanna Williams is a 89 y.o. who presents for a Medicare Wellness preventive visit.  As a reminder, Annual Wellness Visits don't include a physical exam, and some assessments may be limited, especially if this visit is performed virtually. We may recommend an in-person follow-up visit with your provider if needed.  Visit Complete: Virtual I connected with  Deanna Williams on 01/24/24 by a audio enabled telemedicine application and verified that I am speaking with the correct person using two identifiers.  Patient Location: Home  Provider Location: Home Office  I discussed the limitations of evaluation and management by telemedicine. The patient expressed understanding and agreed to proceed.  Vital Signs: Because this visit was a virtual/telehealth visit, some criteria may be missing or patient reported. Any vitals not documented were not able to be obtained and vitals that have been documented are patient reported.  VideoDeclined- This patient declined Librarian, academic. Therefore the visit was completed with audio only.  Persons Participating in Visit: Patient.  AWV Questionnaire: No: Patient Medicare AWV questionnaire was not completed prior to this visit.  Cardiac Risk Factors include: advanced age (>25men, >64 women);dyslipidemia;hypertension     Objective:    There were no vitals filed for this visit. There is no height or weight on file to calculate BMI.     01/24/2024    8:17 AM 09/09/2023    5:27 PM 03/03/2022    9:50 AM 09/08/2021    5:04 PM 09/05/2020    3:00 PM 10/18/2017    8:37 AM 05/26/2016    9:57 AM  Advanced Directives  Does Patient Have a Medical Advance Directive? Yes No No Yes Yes Yes  Yes   Type of Estate agent of [place];Living will   Healthcare Power of [place];Living will;Out of facility DNR (pink MOST or yellow form) Healthcare Power of [employer] of [place];Living will  Healthcare Power of [place];Living will  Does patient want to make changes to medical advance directive? No - Patient declined    No - Patient declined    Copy of Healthcare Power of Attorney in Chart? Yes - validated most recent copy scanned in chart (See row information)    No - copy requested Yes  No - copy requested   Would patient like information on creating a medical advance directive?  No - Patient declined No - Patient declined         Data saved with a previous flowsheet row definition    Current Medications (verified) Outpatient Encounter Medications as of 01/24/2024  Medication Sig   amitriptyline  (ELAVIL ) 25 MG tablet Take 1 tablet (25 mg total) by mouth daily.   Cholecalciferol (VITAMIN D3) 25 MCG (1000 UT) CAPS Take 1,000 Units by mouth daily.   cyanocobalamin  (VITAMIN B12) 1000 MCG/ML injection Inject 1,000 mcg into the muscle every 30 (thirty) days.   cyanocobalamin  1000 MCG tablet Take 1,000 mcg by mouth daily.   ELIQUIS  5 MG TABS tablet TAKE 1 TABLET BY MOUTH TWICE A DAY   FARXIGA  10 MG TABS tablet Take 1 tablet (10 mg total) by mouth daily before breakfast.   metoprolol  succinate (TOPROL -XL) 25 MG 24 hr tablet TAKE 1 TABLET BY MOUTH DAILY. TAKE WITH OR IMMEDIATELY FOLLOWING A MEAL.   phenazopyridine  (PYRIDIUM ) 100 MG tablet Take 1 tablet (100 mg total) by mouth 3 (three) times daily as needed for pain.   Torsemide  40 MG TABS Take 40 mg by mouth daily.  No facility-administered encounter medications on file as of 01/24/2024.    Allergies (verified) Patient has no known allergies.   History: Past Medical History:  Diagnosis Date   Atrial fibrillation (HCC)    Cerebral infarction, unspecified (HCC) 09/01/2020   Gastroesophageal reflux disease    Hyperlipidemia    Hypertension    Lichen sclerosus    Lichen sclerosus et atrophicus    Osteopenia    Palpitations    Stroke [facility])    Past Surgical History:  Procedure Laterality Date   CARPAL TUNNEL RELEASE      FOOT SURGERY     HYSTEROSCOPY WITH D & C N/A 11/02/2015   Procedure: DILATATION AND CURETTAGE /HYSTEROSCOPY;  Surgeon: Gladis DELENA Dollar, MD;  Location: [facility] ORS;  Service: Gynecology;  Laterality: N/A;   IR CT HEAD LTD  09/01/2020   IR INTRA CRAN STENT  09/01/2020   IR PERCUTANEOUS ART THROMBECTOMY/INFUSION INTRACRANIAL INC DIAG ANGIO  09/01/2020   IR RADIOLOGIST EVAL & MGMT  10/14/2020   RADIOLOGY WITH ANESTHESIA N/A 09/01/2020   Procedure: IR WITH ANESTHESIA;  Surgeon: Radiologist, Medication, MD;  Location: [facility] OR;  Service: Radiology;  Laterality: N/A;   Family History  Problem Relation Age of Onset   Cancer Mother    Leukemia Mother    Cancer Father        colon   Hypertension Father    Arthritis Sister    Breast cancer Neg Hx    Social History   Socioeconomic History   Marital status: Widowed    Spouse name: Not on file   Number of children: 1   Years of education: Not on file   Highest education level: 11th grade  Occupational History   Not on file  Tobacco Use   Smoking status: Never   Smokeless tobacco: Never  Vaping Use   Vaping status: Never Used  Substance and Sexual Activity   Alcohol use: Not Currently    Comment: 1 drink less than once a week.   Drug use: No   Sexual activity: Never  Other Topics Concern   Not on file  Social History Narrative   Not on file   Social Drivers of Health   Financial Resource Strain: Low Risk  (01/24/2024)   Overall Financial Resource Strain (CARDIA)    Difficulty of Paying Living Expenses: Not hard at all  Food Insecurity: No Food Insecurity (01/24/2024)   Hunger Vital Sign    Worried About Running Out of Food in the Last Year: Never true    Ran Out of Food in the Last Year: Never true  Transportation Needs: No Transportation Needs (01/24/2024)   PRAPARE - Administrator, Civil Service (Medical): No    Lack of Transportation (Non-Medical): No  Physical Activity: Insufficiently Active (01/24/2024)   Exercise  Vital Sign    Days of Exercise per Week: 7 days    Minutes of Exercise per Session: 20 min  Stress: No Stress Concern Present (01/24/2024)   [employer] of Occupational Health - Occupational Stress Questionnaire    Feeling of Stress: Not at all  Social Connections: Socially Isolated (01/24/2024)   Social Connection and Isolation Panel    Frequency of Communication with Friends and Family: More than three times a week    Frequency of Social Gatherings with Friends and Family: Twice a week    Attends Religious Services: Never    Database administrator or Organizations: No    Attends Banker Meetings:  Never    Marital Status: Widowed    Tobacco Counseling Counseling given: Not Answered    Clinical Intake:  Pre-visit preparation completed: Yes  Pain : No/denies pain     BMI - recorded: 28 Nutritional Status: BMI 25 -29 Overweight Nutritional Risks: None Diabetes: No  Lab Results  Component Value Date   HGBA1C 6.2 (H) 05/05/2023   HGBA1C 6.2 (H) 10/17/2022   HGBA1C 6.2 (H) 07/06/2021     How often do you need to have someone help you when you read instructions, pamphlets, or other written materials from your doctor or pharmacy?: 1 - Never  Interpreter Needed?: No  Information entered by :: Deanna DAS, LPN   Activities of Daily Living    01/24/2024    8:18 AM  In your present state of health, do you have any difficulty performing the following activities:  Hearing? 0  Vision? 0  Difficulty concentrating or making decisions? 1  Walking or climbing stairs? 0  Dressing or bathing? 0  Doing errands, shopping? 1  Preparing Food and eating ? N  Using the Toilet? N  In the past six months, have you accidently leaked urine? Y  Do you have problems with loss of bowel control? N  Managing your Medications? Y  Managing your Finances? N  Housekeeping or managing your Housekeeping? Y    Patient Care Team: Sharma Coyer, MD as PCP -  General (Family Medicine) Inocencio Soyla Lunger, MD as PCP - Electrophysiology (Cardiology) End, Lonni, MD as PCP - Cardiology (Cardiology) Lenn Standing, MD as Consulting Physician (Ophthalmology) Hester Alm BROCKS, MD as Consulting Physician (Dermatology)  I have updated your Care Teams any recent Medical Services you may have received from other providers in the past year.     Assessment:   This is a routine wellness examination for Deanna Williams.  Hearing/Vision screen Hearing Screening - Comments:: NO AIDS Vision Screening - Comments:: [email or web address]   Goals Addressed             This Visit's Progress    DIET - EAT MORE FRUITS AND VEGETABLES         Depression Screen     01/24/2024    8:15 AM 01/09/2024   11:19 AM 12/06/2023   11:24 AM 07/12/2023   11:12 AM 06/14/2023    1:56 PM 10/17/2022    8:29 AM 06/16/2022    8:18 AM  PHQ 2/9 Scores  PHQ - 2 Score 0 5 0 0 2 2 0  PHQ- 9 Score 0 14 1 4 4 6 3     Fall Risk     01/24/2024    8:18 AM 01/09/2024   11:19 AM 12/06/2023   11:24 AM 06/14/2023    1:56 PM 10/17/2022    8:29 AM  Fall Risk   Falls in the past year? 1 1 0 1 0  Number falls in past yr: 1 0 0 1 0  Injury with Fall? 0 0 0 0 0  Risk for fall due to : Impaired balance/gait  No Fall Risks History of fall(s) No Fall Risks  Follow up Falls evaluation completed;Falls prevention discussed Falls evaluation completed  Falls evaluation completed Falls evaluation completed    MEDICARE RISK AT HOME:  Medicare Risk at Home Any stairs in or around the home?: No If so, are there any without handrails?: No Home free of loose throw rugs in walkways, pet beds, electrical cords, etc?: Yes Adequate lighting in your home to reduce risk of falls?: Yes  Life alert?: Yes Use of a cane, walker or w/c?: Yes (WALKER ALL THE TIME) Grab bars in the bathroom?: Yes Shower chair or bench in shower?: Yes Elevated toilet seat or a handicapped toilet?: No  TIMED UP AND GO:  Was  the test performed?  No  Cognitive Function: 6CIT completed        01/24/2024    8:20 AM 01/21/2020    9:50 AM 05/26/2016   10:04 AM  6CIT Screen  What Year? 0 points 0 points 0 points  What month? 0 points 0 points 0 points  What time? 3 points 0 points 0 points  Count back from 20 0 points 0 points 0 points  Months in reverse 0 points 0 points 0 points  Repeat phrase 4 points 4 points 0 points  Total Score 7 points 4 points 0 points    Immunizations Immunization History  Administered Date(s) Administered   Fluad Quad(high Dose 65+) 02/20/2020, 01/26/2021, 02/09/2022   INFLUENZA, HIGH DOSE SEASONAL PF 02/11/2016, 03/07/2017, 02/01/2018, 02/26/2019   Influenza-Unspecified 02/13/2014   PFIZER(Purple Top)SARS-COV-2 Vaccination 05/27/2019, 06/17/2019, 02/20/2020, 10/07/2020   PNEUMOCOCCAL CONJUGATE-20 01/26/2021   Pfizer Covid-19 Vaccine Bivalent Booster 60yrs & up 01/29/2021   Pneumococcal Conjugate-13 03/24/2014   Pneumococcal Polysaccharide-23 03/04/1998   Td 03/06/2008   Zoster Recombinant(Shingrix) 01/06/2017, 10/27/2017   Zoster, Live 07/30/2007    Screening Tests Health Maintenance  Topic Date Due   DTaP/Tdap/Td (2 - Tdap) 03/06/2018   Influenza Vaccine  12/15/2023   COVID-19 Vaccine (6 - 2025-26 season) 01/15/2024   Medicare Annual Wellness (AWV)  01/23/2025   Pneumococcal Vaccine: 50+ Years  Completed   DEXA SCAN  Completed   Zoster Vaccines- Shingrix  Completed   HPV VACCINES  Aged Out   Meningococcal B Vaccine  Aged Out    Health Maintenance Items Addressed: NEEDS COVID; AGED OUT OF MAMMOGRAM, BDS & COLONOSCOPY  Additional Screening:  Vision Screening: Recommended annual ophthalmology exams for early detection of glaucoma and other disorders of the eye. Is the patient up to date with their annual eye exam?  Yes  Who is the provider or what is the name of the office in which the patient attends annual eye exams? DR.DINGELDEIN  Dental Screening:  Recommended annual dental exams for proper oral hygiene  Community Resource Referral / Chronic Care Management: CRR required this visit?  No   CCM required this visit?  No   Plan:    I have personally reviewed and noted the following in the patient's chart:   Medical and social history Use of alcohol, tobacco or illicit drugs  Current medications and supplements including opioid prescriptions. Patient is not currently taking opioid prescriptions. Functional ability and status Nutritional status Physical activity Advanced directives List of other physicians Hospitalizations, surgeries, and ER visits in previous 12 months Vitals Screenings to include cognitive, depression, and falls Referrals and appointments  In addition, I have reviewed and discussed with patient certain preventive protocols, quality metrics, and best practice recommendations. A written personalized care plan for preventive services as well as general preventive health recommendations were provided to patient.   Deanna GORMAN Das, LPN   [id number]   After Visit Summary: (MyChart) Due to this being a telephonic visit, the after visit summary with patients personalized plan was offered to patient via MyChart   Notes: Nothing significant to report at this time.

## 2024-01-24 NOTE — Patient Instructions (Addendum)
 Deanna Williams,  Thank you for taking the time for your Medicare Wellness Visit. I appreciate your continued commitment to your health goals. Please review the care plan we discussed, and feel free to reach out if I can assist you further.  Medicare recommends these wellness visits once per year to help you and your care team stay ahead of potential health issues. These visits are designed to focus on prevention, allowing your provider to concentrate on managing your acute and chronic conditions during your regular appointments.  Please note that Annual Wellness Visits do not include a physical exam. Some assessments may be limited, especially if the visit was conducted virtually. If needed, we may recommend a separate in-person follow-up with your provider.  Ongoing Care Seeing your primary care provider every 3 to 6 months helps us  monitor your health and provide consistent, personalized care.   Referrals If a referral was made during today's visit and you haven't received any updates within two weeks, please contact the referred provider directly to check on the status.  Recommended Screenings:  Health Maintenance  Topic Date Due   DTaP/Tdap/Td vaccine (2 - Tdap) 03/06/2018   Flu Shot  12/15/2023   COVID-19 Vaccine (6 - 2025-26 season) 01/15/2024   Medicare Annual Wellness Visit  01/23/2025   Pneumococcal Vaccine for age over 2  Completed   DEXA scan (bone density measurement)  Completed   Zoster (Shingles) Vaccine  Completed   HPV Vaccine  Aged Out   Meningitis B Vaccine  Aged Out       01/24/2024    8:17 AM  Advanced Directives  Does Patient Have a Medical Advance Directive? Yes  Type of Estate agent of [place];Living will  Does patient want to make changes to medical advance directive? No - Patient declined  Copy of Healthcare Power of Attorney in Chart? Yes - validated most recent copy scanned in chart (See row information)   Advance Care Planning is  important because it: Ensures you receive medical care that aligns with your values, goals, and preferences. Provides guidance to your family and loved ones, reducing the emotional burden of decision-making during critical moments.  Vision: Annual vision screenings are recommended for early detection of glaucoma, cataracts, and diabetic retinopathy. These exams can also reveal signs of chronic conditions such as diabetes and high blood pressure.  Dental: Annual dental screenings help detect early signs of oral cancer, gum disease, and other conditions linked to overall health, including heart disease and diabetes.  Please see the attached documents for additional preventive care recommendations.   NEXT AWV 01/28/25 @ 1:50 PM BY PHONE

## 2024-02-27 NOTE — Progress Notes (Signed)
 Medicare Annual Wellness Visit  Subjective:   Deanna Williams  Is a  89 y.o. WWF who presents for annual exam and an Annual Wellness Visit. History of Present Illness Deanna Williams is a 89 year old female with a history of stroke who presents with fatigue and cold sensation in her legs.  She has been experiencing persistent fatigue and lack of energy since her stroke, with no return to her normal energy levels.  She describes a cold sensation in her calves and the tops of her feet, extending up to her knees, without associated pain or numbness.  She has a cyst on her hand that was previously drained but has recurred, causing no pain.  She denies any falls and uses a cane for mobility. She reports some hearing loss.  No chest pain, shortness of breath, or bleeding issues. She also denies any emotional distress, stating she is doing okay emotionally.  She has a history of leg swelling and reports that her shoes now fit better.  Patient Active Problem List  Diagnosis  . Acute ischemic left PCA stroke (CMS/HHS-HCC)  . Cerebral infarction, unspecified (CMS/HHS-HCC)  . Allergic rhinitis  . Arthritis, degenerative  . Atrial fibrillation (CMS/HHS-HCC)  . Awareness of heartbeats  . Bloodgood disease  . BP (high blood pressure)  . Burning with urination  . Endometrial polyp  . Frequent urination  . Healthcare maintenance  . Hypercholesterolemia  . Hyperglycemia  . Lichen sclerosus  . Osteopenia  . Osteoporosis  . Prediabetes  . S/P angioplasty with stent  . Acid reflux    Outpatient Medications Prior to Visit  Medication Sig Dispense Refill  . apixaban  (ELIQUIS ) 5 mg tablet Take 5 mg by mouth 2 (two) times daily    . cholecalciferol (VITAMIN D3) 1000 unit capsule Take by mouth    . cyanocobalamin  (VITAMIN B12) 1000 MCG tablet Take 1,000 mcg by mouth once daily    . metoprolol  succinate (TOPROL -XL) 25 MG XL tablet Take by mouth    . TORsemide  (DEMADEX ) 20 MG tablet Take 20 mg  by mouth 2 (two) times daily    . docosahexaenoic acid/epa (FISH OIL ORAL) Take by mouth (Patient not taking: Reported on 02/27/2024)    . estradioL (ESTRACE) 0.01 % (0.1 mg/gram) vaginal cream Place 0.5 g vaginally once daily Apply dab to urethral opening daily (Patient not taking: Reported on 02/27/2024) 42.5 g 3  . multivitamin tablet Take 1 tablet by mouth once daily     No facility-administered medications prior to visit.    No Known Allergies Past Medical History:  Diagnosis Date  . History of stroke   . Hyperlipidemia   . Hypertension    No past surgical history on file. No family history on file. Social History   Socioeconomic History  . Marital status: Unknown  . Number of children: 1  Tobacco Use  . Smoking status: Never  . Smokeless tobacco: Never  Vaping Use  . Vaping status: Never Used  Substance and Sexual Activity  . Alcohol use: Never  . Drug use: Never  . Sexual activity: Not Currently   Social Drivers of Health   Financial Resource Strain: Low Risk  (02/27/2024)   Overall Financial Resource Strain (CARDIA)   . Difficulty of Paying Living Expenses: Not hard at all  Food Insecurity: No Food Insecurity (02/27/2024)   Hunger Vital Sign   . Worried About Programme Researcher, Broadcasting/film/video in the Last Year: Never true   . Ran Out of Food  in the Last Year: Never true  Transportation Needs: No Transportation Needs (02/27/2024)   PRAPARE - Transportation   . Lack of Transportation (Medical): No   . Lack of Transportation (Non-Medical): No  Physical Activity: Inactive (02/27/2024)   Exercise Vital Sign   . Days of Exercise per Week: 0 days   . Minutes of Exercise per Session: 0 min  Stress: No Stress Concern Present (01/24/2024)   Received from [facility] Occupational Health - Occupational Stress Questionnaire   . Do you feel stress - tense, restless, nervous, or anxious, or unable to sleep at night because your mind is troubled all the time - these  days?: Not at all  Social Connections: Socially Isolated (01/24/2024)   Received from [facility]   Social Connection and Isolation Panel   . In a typical week, how many times do you talk on the phone with family, friends, or neighbors?: More than three times a week   . How often do you get together with friends or relatives?: Twice a week   . How often do you attend church or religious services?: Never   . Do you belong to any clubs or organizations such as church groups, unions, fraternal or athletic groups, or school groups?: No   . How often do you attend meetings of the clubs or organizations you belong to?: Never   . Are you married, widowed, divorced, separated, never married, or living with a partner?: Widowed  Housing Stability: Low Risk  (02/27/2024)   Housing Stability Vital Sign   . Unable to Pay for Housing in the Last Year: No   . Number of Times Moved in the Last Year: 0   . Homeless in the Last Year: No      Recent Hospitalizations?   Health Habits: Current exercise activities include:  Exercise: Diet: well balanced   Health Risk Assessment: The patient has completed a Health Risk Assessment. This has been reveiwed with them and has been scanned into the [place] system as an attached document.  Current Medical Providers and Suppliers: [facility] Patient Care Team: Bertrum Charlie Raring, MD as PCP - General (Family Medicine)  Age-appropriate Screening Schedule: The list below includes current immunization status and future screening recommendations based on patient's age. Orders for these recommended tests are listed in the plan section. The patient has been provided with a written plan. Immunization History  Administered Date(s) Administered  . COVID-19 Pfizer Monovalent Vaccine (original formulation) 05/27/2019, 06/17/2019, 02/20/2020, 10/07/2020  . COVID-19 vaccine >12 years (Pfizer-Biontech, Bivalent) IM Injection 30 mcg/0.3mL 01/29/2021  . Covid-19 Vaccine  38mcg/0.3ml (>=57yrs) Comirnaty Pfizer-Biontech 12/06/2023  . Covid-19 Vaccine 106mcg/0.5ml (>=50yrs) SPIKEVAX Moderna (NON-FORMULARY AT [facility]) 02/24/2022  . Flu Vaccine HD-IIV3 High Dose, IM PF(88yo+)(Fluzone) 02/11/2016, 03/07/2017, 02/01/2018, 02/26/2019, 12/06/2023  . Influenza IIV4, IM PF (65 Yr+) (FLUAD QUAD) 02/20/2020, 01/26/2021, 02/09/2022  . Influenza, IM unspecified 02/13/2014  . PNEUMOCOCCAL (PCV13) (BIRTH-33YR) VACCINE (PREVNAR 13) 03/24/2014  . PNEUMOCOCCAL (PPSV23)(>=524YRS -OR- >=2 YRS WITH RISK) VACCINE (PNEUMOVAX 23) 03/04/1998  . Pneumococcal (PCV20) (>=6WKS) vaccine (Prevnar 20) (aka PCV 20) 01/26/2021  . RSV Adult Vaccine(>=40yr)(Arexvy) 02/24/2022  . RZV(>=54YR -OR-19+YRS IF  IMMCOMP) VACCINE (SHINGRIX) 01/06/2017, 10/27/2017  . TD (>=24YR) VACCINE (TDVAX) 03/06/2008  . Varicella zoster (Zostavax) 07/30/2007   Health Maintenance  Topic Date Due  . Lipid Panel  Never done  . DXA Bone Density Scan  Never done  . Annual Physical/Well Child Check  Never done  . Adult Tetanus (  Td And Tdap)  02/26/2025 (Originally 03/06/2018)  . COVID-19 Vaccine (4 - 2024-25 season) 06/07/2024  . Potassium Level  01/29/2025  . Serum Calcium   01/29/2025  . Depression Screening  02/26/2025  . Medicare Subsequent AWV [id number]  02/27/2025  . Medicare Initial AWV [id number]  Completed  . Pneumococcal Vaccine: 50+  Completed  . Shingrix  Completed  . RSV Immunization Pregnant or 60+  Completed  . Influenza Vaccine  Completed  . Hib Vaccines  Aged Out  . Hepatitis A Vaccines  Aged Out  . Meningococcal B Vaccine  Aged Out  . Meningococcal ACWY Vaccine  Aged Out  . HPV Vaccines  Aged Out     Depression Screen-PHQ2/9 completed today :per questionaire    Functional Ability/Safety Screen: 1. Was the patient's timed Get Up and Go Test unsteady or longer than 30 sec?     No    2. Does the patient need help with: Shaune index)  Bathing: No   Dressing: No Toileting: No   Transferring: No   Continence: No  Feeding: No   3. Does the home have: Rugs in the hallway: No Grab bars in the bathroom: No Stairs in home: Yes Handrails on the stairs: Yes Poor lighting: No  Hearing Evaluation: Do you have trouble hearing the television when others do not? No Do you have to strain to hear/understand conversations? No   Advanced Care Planning: Patient has executed an Advance Directive: yes  If no, patient was given the opportunity to execute an Advance Directive today? yes  Are the patient's advanced directives in [place]? no This patient has the ability to prepare an Advance Directive: Yes Provider is willing to follow the patient's wishes: Yes  Cognitive Assessment: Does the patient have evidence of cognitive impairment? No The patient does not have any evidence of any cognitive problems and denies any  change in mood/affect, appearance, speech, memory or motor skills.  Identification of Risk Factors: Risk factors include: cardiovascular risk, increased fall risk and polypharmacy  Review of Systems General ROS:  Negative for  - fatigue, fevers, chills HEENT ROS: negative for seasonal allergies, congestion, cough Respiratory ROS: negative for - cough, SOB, wheezing Cardiovascular ROS: no chest pain or dyspnea on exertion Gastrointestinal ROS: negative for constipation, N/V,D Genito-Urinary ROS: no dysuria, trouble voiding, or hematuria Musculoskeletal ROS: negative for joint pain, swelling Neurological ROS: negative for headaches, dizzinesss Dermatological ROS: negative for rash or skin lesion    Psychological ROS: negative for depression or anxiety   Social Drivers of Health   Tobacco Use: Low Risk  (01/30/2024)   Patient History   . Smoking Tobacco Use: Never   . Smokeless Tobacco Use: Never   . Passive Exposure: Not on file  Alcohol Use: Not At Risk (02/27/2024)   AUDIT-C   . Frequency of Alcohol Consumption: Never   . Average Number of Drinks: Patient does not  drink   . Frequency of Binge Drinking: Never  Financial Resource Strain: Low Risk  (02/27/2024)   Overall Financial Resource Strain (CARDIA)   . Difficulty of Paying Living Expenses: Not hard at all  Food Insecurity: No Food Insecurity (02/27/2024)   Hunger Vital Sign   . Worried About Programme Researcher, Broadcasting/film/video in the Last Year: Never true   . Ran Out of Food in the Last Year: Never true  Transportation Needs: No Transportation Needs (02/27/2024)   PRAPARE - Transportation   . Lack of Transportation (Medical): No   . Lack of Transportation (  Non-Medical): No  Physical Activity: Inactive (02/27/2024)   Exercise Vital Sign   . Days of Exercise per Week: 0 days   . Minutes of Exercise per Session: 0 min  Stress: No Stress Concern Present (01/24/2024)   Received from [facility] of Occupational Health - Occupational Stress Questionnaire   . Do you feel stress - tense, restless, nervous, or anxious, or unable to sleep at night because your mind is troubled all the time - these days?: Not at all  Social Connections: Socially Isolated (01/24/2024)   Received from [facility]   Social Connection and Isolation Panel   . In a typical week, how many times do you talk on the phone with family, friends, or neighbors?: More than three times a week   . How often do you get together with friends or relatives?: Twice a week   . How often do you attend church or religious services?: Never   . Do you belong to any clubs or organizations such as church groups, unions, fraternal or athletic groups, or school groups?: No   . How often do you attend meetings of the clubs or organizations you belong to?: Never   . Are you married, widowed, divorced, separated, never married, or living with a partner?: Widowed  Depression: Not at risk (02/27/2024)   PHQ-2   . PHQ-2 Score: 0  Housing Stability: Low Risk  (02/27/2024)   Housing Stability Vital Sign   . Unable to Pay for Housing in the Last Year:  No   . Number of Times Moved in the Last Year: 0   . Homeless in the Last Year: No  Utilities: Not At Risk (02/27/2024)   [facility] Utilities   . Threatened with loss of utilities: No  Health Literacy: Adequate Health Literacy (02/27/2024)   B1300 Health Literacy   . Frequency of need for help with medical instructions: Never      Objective:   Vitals:   02/27/24 1447  BP: (!) 150/80  Pulse: 64  Resp: 16   Body mass index is 24.55 kg/m.  Gen: AAOx3. Well-developed and well-nourished. NAD.  HEENT:  HEAD NORMOCEPHALIC.  PERRLA, EOM intact.  No thyromegaly present. No lymphadpathy.  Cardiovascular: Normal rate, regular rhythm. Normal S1 and S2 without murmus, rubs or gallops.  Pulmonary/Chest: CTAB. Effort normal and breath sounds normal. No respiratory distress. No wheezes or rales. Abdomen: Soft. Bowel sounds are normal. No distension or tenderness.  Neuro: Cranial nervess II-XII intact.Nonfocal exam. Skin: Skin is warm and dry.  Musculoskeletal: Normal range of motion. No edema, no tenderness.  Psychiatric: Normal mood and affect. Her behavior is normal. Judgment and thought content normal    Assessment/Plan:   Assessment & Plan Routine general medical examination at health care facility  (primary encounter diagnosis)  H/O: CVA (cerebrovascular accident)  Depression screening (Z13.31) Plan: Depression Screen -(PHQ- 2/9, BDI)  Hypercholesterolemia Plan: Lipid Panel w/calc LDL  Prediabetes  Gastroesophageal reflux disease with esophagitis without hemorrhage  Atrial fibrillation, unspecified type (CMS/HHS-HCC)  Hypertension, unspecified type  Estrogen deficiency  S/P angioplasty with stent  Neuropathy of both feet  Medicare annual wellness visit, subsequent Patient was brought in today by her grandson.  She now lives very happily in assisted living at [facility].  She has some hearing loss.  She does have some cognitive impairment.  Denies any depression.  She  does have a living will.  No falls in the past year.  She feels safe in  her home.  No problems with ADLs other than she needs help with her finances.  She is in need of a tetanus flu shot.  Social determinants of health are not an active issue as patient has complete access to healthcare, food, housing, transportation, utilities.   Adult Wellness Visit Overall well with no significant issues. Emotional health well-managed. - Continue current medications. - Encourage use of cane for balance.  Lower extremity neuropathy symptoms Cold sensation in calves and feet without pain or numbness. Good pulses, unlikely vascular issue. Possible neuropathy. B12 deficiency considered but on supplementation. - Recommend alpha lipoic acid over-the-counter supplement daily. - Consider checking B12 level at next visit if symptoms persist.  Lower extremity edema Reports feeling of swelling but primarily cold sensation. Swelling improved compared to previous months.  Posterior stroke with residual balance impairment Residual balance issues post-stroke. No new falls. Uses a cane for assistance. - Continue using cane for balance support.  Hand cyst Cyst on hand previously drained but recurred. No pain. Discussed surgical removal but not necessary if asymptomatic. Draining may provide temporary relief but likely to recur. - Consider referral to hand surgeon if symptomatic. - Monitor for changes in size or symptoms.   Patient Self-Management and Personalized Health Advice The patient has been provided with information about: prevention of cardiac or vascular disease and fall prevention  During the course of the visit the patient was educated and counseled about appropriate screening and preventive services including:      Orders placed during this encounter include: Diagnoses and all orders for this visit:  Medicare annual wellness visit, subsequent  Depression screening (Z13.31) -     Depression  Screen -(PHQ- 2/9, BDI)  Hypercholesterolemia -     Lipid Panel w/calc LDL  Prediabetes  Gastroesophageal reflux disease with esophagitis without hemorrhage  Atrial fibrillation, unspecified type (CMS/HHS-HCC)  Hypertension, unspecified type  Estrogen deficiency   No follow-ups on file. Future Appointments   This patient does not currently have any appointments scheduled.        This note has been created using automated tools and reviewed for accuracy by [facility].

## 2024-03-01 ENCOUNTER — Other Ambulatory Visit: Payer: Self-pay | Admitting: Family Medicine

## 2024-03-01 ENCOUNTER — Ambulatory Visit: Admitting: Family Medicine

## 2024-03-01 DIAGNOSIS — R609 Edema, unspecified: Secondary | ICD-10-CM

## 2024-03-29 NOTE — Progress Notes (Signed)
 Cardiology Clinic Note   Date: 04/01/2024 ID: Deanna Williams, Deanna Williams [birth year], MRN [id number]  Primary Cardiologist:  Lonni Hanson, MD  Chief Complaint   Deanna Williams is a 89 y.o. female who presents to the clinic today for routine follow up.   Patient Profile   Deanna Williams is followed by Dr. Hanson for the history outlined below.      Past medical history significant for: Chronic HFpEF. Echo 05/31/2022: EF 60 to 65%.  No RWMA.  Mild LVH.  Grade I DD.  Normal RV size/function.  Mildly elevated PA pressure, RVSP 36.6 mmHg.  Moderate LAE.  Mild MR.  Moderate TR.  Aortic valve sclerosis without stenosis.  Dilated IVC, RA pressure 8 mmHg. PAF. Onset April 2022 in the setting of acute stroke. 14-day ZIO 06/13/2022: HR 57 to 138 bpm, average 83 bpm.  Occasional PACs (2%).  Rare PVCs.  3 episodes of NSVT lasting up to 9 beats with max rate 210 bpm.  PAF (<1%) with longest episode lasting 3 minutes 46 seconds, average rate 124 bpm (range 97 to 154 bpm).  Patient triggered events corresponded to sinus rhythm, PACs, PVCs. Hypertension. Ischemic stroke.  Cerebral angiography 09/01/2020: S/p endovascular complete revascularization of the left posterior cerebral artery P1 segment.  S/p rescue stenting of reocclusion with restoration of TICI 3 recannulization of the left PCA territory  In summary, patient presented to the ED on 09/01/2020 for right-sided weakness/numbness that started the night prior.  Numbness only on right side of face.  CTA positive for acute PCA occlusion-patient underwent endovascular revascularization and stent placement as detailed above.  Echo at that time demonstrated EF 60 to 65%, no RWMA, Grade I DD, normal RV size/function, mildly elevated PA pressure.  Lower extremity vascular ultrasound was negative for DVT bilaterally.  On 4/22 patient had a brief period of A-fib with rates in the 100s to 130s noted on telemetry.  She was discharged on 09/05/2020 with Eliquis  for PAF  and Brilinta  per neurology for stroke. She had a one follow up visit with Dr. Inocencio in May 2022.  Patient was first evaluated by Dr. Hanson on 05/20/2022 to establish care closer to home. She reported numbness and tingling of right leg and sensation of swelling/tightness of bilateral legs without actual swelling. She reports rare palpitations about once a month lasting 5 minutes without associated symptoms. She also reports occasional lightheadedness without syncope. She reported increased DOE. BP was elevated and amlodipine  was increased. Repeat echo showed normal LV function as detailed above. 14 day Zio showed <1% afib burden as detailed above.  Upon follow up in February 2024 BP was improved. Discussed ischemic evaluation for DOE but patient agreed to defer.   Patient was seen in the office by Dr. Hanson on 07/12/2023 for routine follow up. She complained of increased lower extremity edema. She had been managing edema with elevation but edema never completely resolves. No change in chronic DOE reported. Amlodipine  was decreased by PCP, however, patient may have been taking 12.5 mg inadvertently. Patient was instructed to decrease amlodipine  to 5 mg, stop hydrochlorothiazide  and start Lasix  40 mg daily.    Patient was seen in the clinic on 08/03/2023 for follow-up after medication changes.  She reported no change to lower extremity edema since starting Lasix .  She does notice improvement in edema with compression socks but is unable to wear them regularly as she cannot get them off by herself.  PCP change Lasix  to torsemide  and instructed patient  to continue amlodipine  7.5 mg.  She was instructed to reduce dose of amlodipine  to 5 mg as instructed by Dr. Mady in February.  Patient was seen by Ellouise Class, FNP and advanced heart failure clinic on 09/14/2023.  She was mildly fluid up at that time.  Amlodipine  was decreased to 2.5 mg daily.  She was started on Farxiga .   Patient was last seen in the office by me on  10/05/2023 for routine follow-up.  She reported having a bad night sleep the night prior secondary to being worried about her friend.  She reported cardiac symptoms were stable with continued lower extremity edema unchanged since starting Farxiga .  Her granddaughter noted that she seemed a little more winded walking to the exam room but felt it could be related to her having a bad night.  Home weight was stable between 140 to 145 pounds.  She had not yet tried her zip up compression socks.  She was instructed to stop amlodipine  given her soft BP.  She was instructed to take an extra dose of torsemide  for 2 days and keep follow-up with advanced heart failure clinic.  Patient was seen by Ellouise Class, FNP in the advanced heart failure clinic on 10/12/2023 for follow-up.  Adding MRA was discussed but grandson was hesitant as cardiology did not start this for her.  Grandson voiced frustration over multiple doctors visits with no improvement of lower extremity edema.  There was a question of edema being vascular versus heart failure.  Torsemide  was increased and plan for addition of MRA was reiterated if edema did not improve.  Ultimate decision was for patient to continue to follow with general cardiology regularly instead of multiple visits between cardiology and heart failure clinic.     History of Present Illness    Today, patient is accompanied by her granddaughter. She reports doing well overall. She continues to have dyspnea with exertion and lower extremity edema. She feels it is unchanged from previous. Her granddaughter states she feels it is greatly improved since coming off the amlodipine  stating she can wear normal shoes now. Patient denies orthopnea or PND. She does not always wear compression socks because she has a hard time zipping them up on her own. She does not really elevate her legs throughout the day but will sit with them straight out in front of her. She has not been weighing herself at  home. She denies chest pain, pressure or tightness. She has occasional, brief palpitations. She is less dizzy since stopping the amlodipine . No recent falls.     ROS: All other systems reviewed and are otherwise negative except as noted in History of Present Illness.  EKGs/Labs Reviewed    EKG Interpretation Date/Time:  Monday April 01 2024 14:35:57 EST Ventricular Rate:  63 PR Interval:  158 QRS Duration:  62 QT Interval:  438 QTC Calculation: 448 R Axis:   -3  Text Interpretation: Normal sinus rhythm Possible Left atrial enlargement Low voltage QRS Septal infarct , age undetermined When compared with ECG of 09-Sep-2023 22:11, PREVIOUS ECG IS PRESENT Confirmed by Loistine Sober [phone]) on 04/01/2024 2:44:37 PM   09/09/2023: ALT 12; AST 18 10/12/2023: BUN 18; Creatinine, Ser 1.00; Potassium 3.8; Sodium 139   09/09/2023: Hemoglobin 14.6; WBC 5.2   06/27/2023: TSH 3.630   06/27/2023: NT-Pro BNP 368 09/09/2023: B Natriuretic Peptide 297.0   Risk Assessment/Calculations     CHA2DS2-VASc Score = 6   This indicates a 9.7% annual risk of stroke. The patient's  score is based upon: CHF History: 0 HTN History: 1 Diabetes History: 0 Stroke History: 2 Vascular Disease History: 0 Age Score: 2 Gender Score: 1             Physical Exam    VS:  BP 118/78 (BP Location: Left Arm, Patient Position: Sitting, Cuff Size: Normal)   Pulse 63   Ht 5' 3 (1.6 m)   Wt 141 lb (64 kg)   SpO2 98%   BMI 24.98 kg/m  , BMI Body mass index is 24.98 kg/m.  GEN: Well nourished, well developed, in no acute distress. Neck: No JVD or carotid bruits. Cardiac:  RRR.  No murmur. No rubs or gallops.   Respiratory:  Respirations regular and unlabored. Clear to auscultation without rales, wheezing or rhonchi. GI: Soft, nontender, nondistended. Extremities: Radials/DP/PT 2+ and equal bilaterally. No clubbing or cyanosis. Trace edema bilateral lower extremities.   Skin: Warm and dry, no  rash. Neuro: Strength intact.  Assessment & Plan   Chronic HFpEF/lower extremity edema/Chronic DOE Echo January 2024 demonstrated normal LV/RV function, no RWMA, mild LVH, grade I DD, mildly elevated PA pressure, moderate LAE, mild MR, moderate TR. Patient states lower extremity edema and dyspnea with exertion unchanged from previous. Patient's granddaughter feels edema is improved since stopping amlodipine  stating she can wear normal shoes now. Patient states if she flexes her feet she feels a little bit of puffiness in her ankles. She does not wear compression socks regularly because she has a hard time zipping them up on her own. She does not elevate her legs but will sit with them straight out in from of her some of the time. She denies orthopnea or PND. She is not weighing at home. She has trace edema bilateral lower extremities. Euvolemic and well compensated on exam.  - Recommend she weighs daily. Extra torsemide  20 mg for weight gain of 3 lb overnight or 5 lb in a week.  - Add spironolactone 12.5 mg daily.  - BMP in 3 weeks.  - Continue Toprol , Farxiga , torsemide .   PAF Onset April 2022. 14 day Zio January 2024 demonstrated <1% afib burden. Denies spontaneous bleeding concerns. Patient reports occasional, brief palpitations. EKG today demonstrates NSR 63 bpm.  - Continue Toprol  and Eliquis . Appropriate Eliquis  dose.     Hypertension BP today 118/78. She reports less dizziness since stopping amlodipine .  - Continue Toprol . - Add spironolactone as above.   Disposition: Spironolactone 12.5 mg daily. BMP in 3 weeks. Return in 6 months or sooner as needed.          Signed, Barnie HERO. Eliyah Mcshea, DNP, NP-C

## 2024-04-01 ENCOUNTER — Encounter: Payer: Self-pay | Admitting: Student

## 2024-04-01 ENCOUNTER — Ambulatory Visit: Admitting: Student

## 2024-04-01 ENCOUNTER — Ambulatory Visit: Attending: Student | Admitting: Student

## 2024-04-01 VITALS — BP 118/78 | HR 63 | Ht 63.0 in | Wt 141.0 lb

## 2024-04-01 DIAGNOSIS — I5032 Chronic diastolic (congestive) heart failure: Secondary | ICD-10-CM | POA: Diagnosis not present

## 2024-04-01 DIAGNOSIS — I480 Paroxysmal atrial fibrillation: Secondary | ICD-10-CM

## 2024-04-01 DIAGNOSIS — I10 Essential (primary) hypertension: Secondary | ICD-10-CM | POA: Diagnosis not present

## 2024-04-01 DIAGNOSIS — R0609 Other forms of dyspnea: Secondary | ICD-10-CM

## 2024-04-01 DIAGNOSIS — R600 Localized edema: Secondary | ICD-10-CM

## 2024-04-01 MED ORDER — SPIRONOLACTONE 25 MG PO TABS: 12.5000 mg | ORAL_TABLET | Freq: Every day | ORAL | 3 refills | Status: AC

## 2024-04-01 NOTE — Patient Instructions (Signed)
 Medication Instructions:  - START spironolactone 12.5 mg daily   *If you need a refill on your cardiac medications before your next appointment, please call your pharmacy*  Lab Work: Your provider would like for you to return in 3 weeks to have the following labs drawn: BMP.   Please go to [facility] [address] ([facility] [address], [place] [address] You do not need an appointment.  They are open from 8 am- 4:30 pm.  Lunch from 1:00 pm- 2:00 pm You do not need to be fasting.   You may also go to one of the following [facility]:  [address] [place], [place] [address] Phone: [phone] Lab hours: Mon-Fri 8 am- 5 pm    Lunch 12 pm- 1 pm  [address] [place],  [place]  [address]  [place]  Phone: [phone] Lab hours: 7 am- 4 pm Lunch 12 pm-1 pm   [address] [place],  [place]  [address]  [place]  Phone: [phone] Lab hours: Mon-Fri 8 am- 5 pm    Lunch 12 pm- 1 pm  If you have labs (blood work) drawn today and your tests are completely normal, you will receive your results only by: MyChart Message (if you have MyChart) OR A paper copy in the mail If you have any lab test that is abnormal or we need to change your treatment, we will call you to review the results.  Testing/Procedures: No test ordered today   Follow-Up: At [facility], you and your health needs are our priority.  As part of our continuing mission to provide you with exceptional heart care, our providers are all part of one team.  This team includes your primary Cardiologist (physician) and Advanced Practice Providers or APPs (Physician Assistants and Nurse Practitioners) who all work together to provide you with the care you need, when you need it.  Your next appointment:   6 month(s)  Provider:   You may see Lonni Hanson, MD or one of the following Advanced Practice Providers on your designated Care Team:   Barnie Hila, NP  We recommend signing up for the  patient portal called MyChart.  Sign up information is provided on this After Visit Summary.  MyChart is used to connect with patients for Virtual Visits (Telemedicine).  Patients are able to view lab/test results, encounter notes, upcoming appointments, etc.  Non-urgent messages can be sent to your provider as well.   To learn more about what you can do with MyChart, go to [email or web address].

## 2024-06-20 ENCOUNTER — Other Ambulatory Visit: Payer: Self-pay | Admitting: Family Medicine

## 2024-09-30 ENCOUNTER — Ambulatory Visit: Admitting: Student

## 2025-01-28 ENCOUNTER — Ambulatory Visit
